# Patient Record
Sex: Female | Born: 1945 | Race: White | Hispanic: No | Smoking: Former smoker
Health system: Southern US, Community
[De-identification: ages and names within clinical notes are randomized; demographics above are authoritative.]

## PROBLEM LIST (undated history)

## (undated) DIAGNOSIS — J449 Chronic obstructive pulmonary disease, unspecified: Secondary | ICD-10-CM

## (undated) DIAGNOSIS — IMO0001 Reserved for inherently not codable concepts without codable children: Secondary | ICD-10-CM

## (undated) DIAGNOSIS — R0602 Shortness of breath: Secondary | ICD-10-CM

## (undated) DIAGNOSIS — R51 Headache: Secondary | ICD-10-CM

## (undated) DIAGNOSIS — K625 Hemorrhage of anus and rectum: Secondary | ICD-10-CM

## (undated) DIAGNOSIS — K219 Gastro-esophageal reflux disease without esophagitis: Secondary | ICD-10-CM

## (undated) DIAGNOSIS — Z5189 Encounter for other specified aftercare: Secondary | ICD-10-CM

## (undated) DIAGNOSIS — I10 Essential (primary) hypertension: Secondary | ICD-10-CM

## (undated) DIAGNOSIS — G43909 Migraine, unspecified, not intractable, without status migrainosus: Secondary | ICD-10-CM

## (undated) DIAGNOSIS — M1990 Unspecified osteoarthritis, unspecified site: Secondary | ICD-10-CM

## (undated) DIAGNOSIS — R634 Abnormal weight loss: Secondary | ICD-10-CM

## (undated) DIAGNOSIS — E785 Hyperlipidemia, unspecified: Secondary | ICD-10-CM

## (undated) DIAGNOSIS — Z8719 Personal history of other diseases of the digestive system: Secondary | ICD-10-CM

## (undated) DIAGNOSIS — R350 Frequency of micturition: Secondary | ICD-10-CM

## (undated) DIAGNOSIS — R062 Wheezing: Secondary | ICD-10-CM

## (undated) DIAGNOSIS — R7303 Prediabetes: Secondary | ICD-10-CM

## (undated) DIAGNOSIS — R0981 Nasal congestion: Secondary | ICD-10-CM

## (undated) HISTORY — DX: Encounter for other specified aftercare: Z51.89

## (undated) HISTORY — DX: Abnormal weight loss: R63.4

## (undated) HISTORY — DX: Nasal congestion: R09.81

## (undated) HISTORY — PX: LEFT OOPHORECTOMY: SHX1961

## (undated) HISTORY — DX: Chronic obstructive pulmonary disease, unspecified: J44.9

## (undated) HISTORY — DX: Wheezing: R06.2

## (undated) HISTORY — DX: Reserved for inherently not codable concepts without codable children: IMO0001

## (undated) HISTORY — PX: APPENDECTOMY: SHX54

## (undated) HISTORY — PX: TUMOR EXCISION: SHX421

## (undated) HISTORY — PX: TUBAL LIGATION: SHX77

## (undated) HISTORY — PX: KNEE ARTHROSCOPY: SHX127

## (undated) HISTORY — DX: Unspecified osteoarthritis, unspecified site: M19.90

## (undated) HISTORY — DX: Hemorrhage of anus and rectum: K62.5

## (undated) HISTORY — PX: COLONOSCOPY: SHX174

---
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
## 1969-08-19 HISTORY — PX: APPENDECTOMY: SHX54

## 1989-08-19 DIAGNOSIS — Z8719 Personal history of other diseases of the digestive system: Secondary | ICD-10-CM

## 1989-08-19 DIAGNOSIS — Z8711 Personal history of peptic ulcer disease: Secondary | ICD-10-CM

## 1989-08-19 HISTORY — DX: Personal history of peptic ulcer disease: Z87.11

## 1989-08-19 HISTORY — DX: Personal history of other diseases of the digestive system: Z87.19

## 1998-12-27 ENCOUNTER — Encounter: Payer: Self-pay | Admitting: Emergency Medicine

## 1998-12-27 ENCOUNTER — Emergency Department (HOSPITAL_COMMUNITY): Admission: EM | Admit: 1998-12-27 | Discharge: 1998-12-27 | Payer: Self-pay | Admitting: Emergency Medicine

## 1999-09-27 ENCOUNTER — Emergency Department (HOSPITAL_COMMUNITY): Admission: EM | Admit: 1999-09-27 | Discharge: 1999-09-27 | Payer: Self-pay | Admitting: Emergency Medicine

## 1999-09-27 ENCOUNTER — Encounter: Payer: Self-pay | Admitting: Emergency Medicine

## 1999-10-14 ENCOUNTER — Emergency Department (HOSPITAL_COMMUNITY): Admission: EM | Admit: 1999-10-14 | Discharge: 1999-10-14 | Payer: Self-pay | Admitting: Emergency Medicine

## 1999-12-03 ENCOUNTER — Emergency Department (HOSPITAL_COMMUNITY): Admission: EM | Admit: 1999-12-03 | Discharge: 1999-12-04 | Payer: Self-pay | Admitting: Emergency Medicine

## 1999-12-18 ENCOUNTER — Emergency Department (HOSPITAL_COMMUNITY): Admission: EM | Admit: 1999-12-18 | Discharge: 1999-12-19 | Payer: Self-pay | Admitting: Emergency Medicine

## 2000-03-21 ENCOUNTER — Emergency Department (HOSPITAL_COMMUNITY): Admission: EM | Admit: 2000-03-21 | Discharge: 2000-03-21 | Payer: Self-pay | Admitting: Emergency Medicine

## 2000-03-21 ENCOUNTER — Encounter: Payer: Self-pay | Admitting: Emergency Medicine

## 2000-04-09 ENCOUNTER — Emergency Department (HOSPITAL_COMMUNITY): Admission: EM | Admit: 2000-04-09 | Discharge: 2000-04-09 | Payer: Self-pay | Admitting: Emergency Medicine

## 2000-08-14 ENCOUNTER — Emergency Department (HOSPITAL_COMMUNITY): Admission: EM | Admit: 2000-08-14 | Discharge: 2000-08-14 | Payer: Self-pay | Admitting: Emergency Medicine

## 2001-01-07 ENCOUNTER — Emergency Department (HOSPITAL_COMMUNITY): Admission: EM | Admit: 2001-01-07 | Discharge: 2001-01-07 | Payer: Self-pay | Admitting: *Deleted

## 2001-03-19 ENCOUNTER — Emergency Department (HOSPITAL_COMMUNITY): Admission: EM | Admit: 2001-03-19 | Discharge: 2001-03-19 | Payer: Self-pay

## 2001-09-04 ENCOUNTER — Emergency Department (HOSPITAL_COMMUNITY): Admission: EM | Admit: 2001-09-04 | Discharge: 2001-09-04 | Payer: Self-pay | Admitting: Podiatry

## 2002-11-25 ENCOUNTER — Emergency Department (HOSPITAL_COMMUNITY): Admission: EM | Admit: 2002-11-25 | Discharge: 2002-11-25 | Payer: Self-pay | Admitting: Emergency Medicine

## 2002-11-26 ENCOUNTER — Encounter: Payer: Self-pay | Admitting: Emergency Medicine

## 2003-02-15 ENCOUNTER — Emergency Department (HOSPITAL_COMMUNITY): Admission: EM | Admit: 2003-02-15 | Discharge: 2003-02-16 | Payer: Self-pay | Admitting: Emergency Medicine

## 2003-06-13 ENCOUNTER — Emergency Department (HOSPITAL_COMMUNITY): Admission: EM | Admit: 2003-06-13 | Discharge: 2003-06-14 | Payer: Self-pay | Admitting: Emergency Medicine

## 2003-06-14 ENCOUNTER — Encounter: Payer: Self-pay | Admitting: Emergency Medicine

## 2003-10-01 ENCOUNTER — Emergency Department (HOSPITAL_COMMUNITY): Admission: EM | Admit: 2003-10-01 | Discharge: 2003-10-02 | Payer: Self-pay | Admitting: Emergency Medicine

## 2003-10-01 ENCOUNTER — Encounter: Payer: Self-pay | Admitting: Emergency Medicine

## 2003-11-05 ENCOUNTER — Encounter: Admission: RE | Admit: 2003-11-05 | Discharge: 2003-11-05 | Payer: Self-pay | Admitting: Internal Medicine

## 2004-03-05 ENCOUNTER — Emergency Department (HOSPITAL_COMMUNITY): Admission: EM | Admit: 2004-03-05 | Discharge: 2004-03-05 | Payer: Self-pay | Admitting: Emergency Medicine

## 2004-07-07 ENCOUNTER — Encounter: Admission: RE | Admit: 2004-07-07 | Discharge: 2004-07-07 | Payer: Self-pay | Admitting: Internal Medicine

## 2004-07-23 ENCOUNTER — Emergency Department (HOSPITAL_COMMUNITY): Admission: EM | Admit: 2004-07-23 | Discharge: 2004-07-23 | Payer: Self-pay | Admitting: Emergency Medicine

## 2004-09-08 ENCOUNTER — Emergency Department (HOSPITAL_COMMUNITY): Admission: EM | Admit: 2004-09-08 | Discharge: 2004-09-08 | Payer: Self-pay | Admitting: Emergency Medicine

## 2004-11-29 ENCOUNTER — Emergency Department (HOSPITAL_COMMUNITY): Admission: EM | Admit: 2004-11-29 | Discharge: 2004-11-29 | Payer: Self-pay | Admitting: Emergency Medicine

## 2004-12-15 ENCOUNTER — Ambulatory Visit (HOSPITAL_COMMUNITY): Admission: RE | Admit: 2004-12-15 | Discharge: 2004-12-15 | Payer: Self-pay | Admitting: Internal Medicine

## 2005-03-02 ENCOUNTER — Ambulatory Visit: Payer: Self-pay | Admitting: Family Medicine

## 2005-03-03 ENCOUNTER — Ambulatory Visit: Payer: Self-pay | Admitting: *Deleted

## 2005-04-13 ENCOUNTER — Ambulatory Visit: Payer: Self-pay | Admitting: Family Medicine

## 2005-05-04 ENCOUNTER — Ambulatory Visit: Payer: Self-pay | Admitting: Family Medicine

## 2005-05-11 ENCOUNTER — Ambulatory Visit: Payer: Self-pay | Admitting: Family Medicine

## 2005-05-27 ENCOUNTER — Ambulatory Visit: Payer: Self-pay | Admitting: Family Medicine

## 2006-02-24 ENCOUNTER — Emergency Department (HOSPITAL_COMMUNITY): Admission: EM | Admit: 2006-02-24 | Discharge: 2006-02-25 | Payer: Self-pay | Admitting: Emergency Medicine

## 2006-03-22 ENCOUNTER — Ambulatory Visit: Payer: Self-pay | Admitting: Family Medicine

## 2006-03-23 ENCOUNTER — Encounter (INDEPENDENT_AMBULATORY_CARE_PROVIDER_SITE_OTHER): Payer: Self-pay | Admitting: Family Medicine

## 2006-03-23 LAB — CONVERTED CEMR LAB
Microalbumin U total vol: 6.34 mg/L
TSH: 1.419 microintl units/mL

## 2006-03-24 ENCOUNTER — Encounter (INDEPENDENT_AMBULATORY_CARE_PROVIDER_SITE_OTHER): Payer: Self-pay | Admitting: Family Medicine

## 2006-03-24 LAB — CONVERTED CEMR LAB: Hgb A1c MFr Bld: 6.1 %

## 2006-03-30 ENCOUNTER — Ambulatory Visit (HOSPITAL_COMMUNITY): Admission: RE | Admit: 2006-03-30 | Discharge: 2006-03-30 | Payer: Self-pay | Admitting: Obstetrics and Gynecology

## 2006-04-30 LAB — CONVERTED CEMR LAB: Pap Smear: NORMAL

## 2006-05-04 ENCOUNTER — Ambulatory Visit: Payer: Self-pay | Admitting: Family Medicine

## 2006-06-19 ENCOUNTER — Ambulatory Visit: Payer: Self-pay | Admitting: Family Medicine

## 2006-08-09 ENCOUNTER — Ambulatory Visit: Payer: Self-pay | Admitting: Family Medicine

## 2006-08-09 LAB — CONVERTED CEMR LAB: Pap Smear: NORMAL

## 2006-10-04 ENCOUNTER — Ambulatory Visit: Payer: Self-pay | Admitting: Family Medicine

## 2006-12-15 ENCOUNTER — Ambulatory Visit (HOSPITAL_COMMUNITY): Admission: RE | Admit: 2006-12-15 | Discharge: 2006-12-15 | Payer: Self-pay | Admitting: Gastroenterology

## 2007-02-16 ENCOUNTER — Ambulatory Visit: Payer: Self-pay | Admitting: Internal Medicine

## 2007-02-21 ENCOUNTER — Ambulatory Visit: Payer: Self-pay | Admitting: Family Medicine

## 2007-03-05 ENCOUNTER — Emergency Department (HOSPITAL_COMMUNITY): Admission: EM | Admit: 2007-03-05 | Discharge: 2007-03-06 | Payer: Self-pay | Admitting: Emergency Medicine

## 2007-03-09 ENCOUNTER — Ambulatory Visit: Payer: Self-pay | Admitting: Internal Medicine

## 2007-03-16 ENCOUNTER — Encounter: Admission: RE | Admit: 2007-03-16 | Discharge: 2007-04-25 | Payer: Self-pay | Admitting: Internal Medicine

## 2007-04-09 ENCOUNTER — Ambulatory Visit (HOSPITAL_COMMUNITY): Admission: RE | Admit: 2007-04-09 | Discharge: 2007-04-09 | Payer: Self-pay | Admitting: Internal Medicine

## 2007-04-24 ENCOUNTER — Ambulatory Visit: Payer: Self-pay | Admitting: Internal Medicine

## 2007-05-03 ENCOUNTER — Ambulatory Visit: Payer: Self-pay | Admitting: Internal Medicine

## 2007-07-09 ENCOUNTER — Emergency Department (HOSPITAL_COMMUNITY): Admission: EM | Admit: 2007-07-09 | Discharge: 2007-07-09 | Payer: Self-pay | Admitting: Emergency Medicine

## 2007-08-01 ENCOUNTER — Encounter (INDEPENDENT_AMBULATORY_CARE_PROVIDER_SITE_OTHER): Payer: Self-pay | Admitting: Family Medicine

## 2007-08-02 ENCOUNTER — Ambulatory Visit: Payer: Self-pay | Admitting: Family Medicine

## 2007-08-02 ENCOUNTER — Encounter (INDEPENDENT_AMBULATORY_CARE_PROVIDER_SITE_OTHER): Payer: Self-pay | Admitting: Internal Medicine

## 2007-08-02 LAB — CONVERTED CEMR LAB
ALT: 13 units/L (ref 0–35)
AST: 14 units/L (ref 0–37)
Albumin: 4.5 g/dL (ref 3.5–5.2)
Alkaline Phosphatase: 58 units/L (ref 39–117)
BUN: 13 mg/dL (ref 6–23)
Basophils Absolute: 0 10*3/uL (ref 0.0–0.1)
Basophils Relative: 1 % (ref 0–1)
CO2: 24 meq/L (ref 19–32)
Calcium: 9.1 mg/dL (ref 8.4–10.5)
Chloride: 105 meq/L (ref 96–112)
Creatinine, Ser: 0.59 mg/dL (ref 0.40–1.20)
Eosinophils Absolute: 0.2 10*3/uL (ref 0.0–0.7)
Eosinophils Relative: 4 % (ref 0–5)
Glucose, Bld: 91 mg/dL (ref 70–99)
HCT: 43.4 % (ref 36.0–46.0)
Hemoglobin: 14.3 g/dL (ref 12.0–15.0)
Lymphocytes Relative: 31 % (ref 12–46)
Lymphs Abs: 2 10*3/uL (ref 0.7–3.3)
MCHC: 32.9 g/dL (ref 30.0–36.0)
MCV: 90.2 fL (ref 78.0–100.0)
Monocytes Absolute: 0.4 10*3/uL (ref 0.2–0.7)
Monocytes Relative: 7 % (ref 3–11)
Neutro Abs: 3.7 10*3/uL (ref 1.7–7.7)
Neutrophils Relative %: 58 % (ref 43–77)
Platelets: 259 10*3/uL (ref 150–400)
Potassium: 4.5 meq/L (ref 3.5–5.3)
RBC: 4.81 M/uL (ref 3.87–5.11)
RDW: 14 % (ref 11.5–14.0)
Sodium: 140 meq/L (ref 135–145)
Total Bilirubin: 0.4 mg/dL (ref 0.3–1.2)
Total Protein: 6.7 g/dL (ref 6.0–8.3)
WBC: 6.3 10*3/uL (ref 4.0–10.5)

## 2007-08-09 ENCOUNTER — Encounter (INDEPENDENT_AMBULATORY_CARE_PROVIDER_SITE_OTHER): Payer: Self-pay | Admitting: Family Medicine

## 2007-08-09 DIAGNOSIS — G43009 Migraine without aura, not intractable, without status migrainosus: Secondary | ICD-10-CM

## 2007-08-09 DIAGNOSIS — Z8679 Personal history of other diseases of the circulatory system: Secondary | ICD-10-CM | POA: Insufficient documentation

## 2007-08-09 DIAGNOSIS — I10 Essential (primary) hypertension: Secondary | ICD-10-CM | POA: Insufficient documentation

## 2007-08-09 HISTORY — DX: Personal history of other diseases of the circulatory system: Z86.79

## 2007-08-09 HISTORY — DX: Essential (primary) hypertension: I10

## 2007-08-09 HISTORY — DX: Migraine without aura, not intractable, without status migrainosus: G43.009

## 2007-08-25 DIAGNOSIS — E785 Hyperlipidemia, unspecified: Secondary | ICD-10-CM | POA: Insufficient documentation

## 2007-08-25 HISTORY — DX: Hyperlipidemia, unspecified: E78.5

## 2007-09-05 ENCOUNTER — Encounter (INDEPENDENT_AMBULATORY_CARE_PROVIDER_SITE_OTHER): Payer: Self-pay | Admitting: *Deleted

## 2007-09-24 ENCOUNTER — Telehealth (INDEPENDENT_AMBULATORY_CARE_PROVIDER_SITE_OTHER): Payer: Self-pay | Admitting: *Deleted

## 2007-09-28 ENCOUNTER — Ambulatory Visit: Payer: Self-pay | Admitting: Internal Medicine

## 2007-09-28 LAB — CONVERTED CEMR LAB
Bilirubin Urine: NEGATIVE
Glucose, Urine, Semiquant: NEGATIVE
Ketones, urine, test strip: NEGATIVE
Nitrite: NEGATIVE
Protein, U semiquant: NEGATIVE
Specific Gravity, Urine: >=1.03
Urobilinogen, UA: NEGATIVE
WBC Urine, dipstick: NEGATIVE
pH: 5.5

## 2007-09-29 ENCOUNTER — Encounter (INDEPENDENT_AMBULATORY_CARE_PROVIDER_SITE_OTHER): Payer: Self-pay | Admitting: Internal Medicine

## 2007-10-04 ENCOUNTER — Telehealth (INDEPENDENT_AMBULATORY_CARE_PROVIDER_SITE_OTHER): Payer: Self-pay | Admitting: Internal Medicine

## 2007-10-10 ENCOUNTER — Encounter (INDEPENDENT_AMBULATORY_CARE_PROVIDER_SITE_OTHER): Payer: Self-pay | Admitting: Family Medicine

## 2007-12-19 ENCOUNTER — Telehealth (INDEPENDENT_AMBULATORY_CARE_PROVIDER_SITE_OTHER): Payer: Self-pay | Admitting: Nurse Practitioner

## 2007-12-21 ENCOUNTER — Ambulatory Visit: Payer: Self-pay | Admitting: Family Medicine

## 2007-12-21 ENCOUNTER — Telehealth (INDEPENDENT_AMBULATORY_CARE_PROVIDER_SITE_OTHER): Payer: Self-pay | Admitting: Family Medicine

## 2008-02-06 ENCOUNTER — Telehealth (INDEPENDENT_AMBULATORY_CARE_PROVIDER_SITE_OTHER): Payer: Self-pay | Admitting: Nurse Practitioner

## 2008-02-08 ENCOUNTER — Ambulatory Visit: Payer: Self-pay | Admitting: Nurse Practitioner

## 2008-02-26 ENCOUNTER — Ambulatory Visit: Payer: Self-pay | Admitting: Family Medicine

## 2008-02-26 ENCOUNTER — Ambulatory Visit (HOSPITAL_COMMUNITY): Admission: RE | Admit: 2008-02-26 | Discharge: 2008-02-26 | Payer: Self-pay | Admitting: Family Medicine

## 2008-02-27 ENCOUNTER — Ambulatory Visit (HOSPITAL_COMMUNITY): Admission: RE | Admit: 2008-02-27 | Discharge: 2008-02-27 | Payer: Self-pay | Admitting: Family Medicine

## 2008-03-21 ENCOUNTER — Telehealth (INDEPENDENT_AMBULATORY_CARE_PROVIDER_SITE_OTHER): Payer: Self-pay | Admitting: Internal Medicine

## 2008-03-24 LAB — CONVERTED CEMR LAB
ALT: 12 units/L (ref 0–35)
AST: 15 units/L (ref 0–37)
Albumin: 4.5 g/dL (ref 3.5–5.2)
Alkaline Phosphatase: 64 units/L (ref 39–117)
BUN: 14 mg/dL (ref 6–23)
CO2: 25 meq/L (ref 19–32)
Calcium: 9.2 mg/dL (ref 8.4–10.5)
Chloride: 106 meq/L (ref 96–112)
Cholesterol: 265 mg/dL — ABNORMAL HIGH (ref 0–200)
Creatinine, Ser: 0.65 mg/dL (ref 0.40–1.20)
Glucose, Bld: 81 mg/dL (ref 70–99)
HDL: 48 mg/dL (ref >39)
LDL Cholesterol: 188 mg/dL — ABNORMAL HIGH (ref 0–99)
Potassium: 4.8 meq/L (ref 3.5–5.3)
Sodium: 143 meq/L (ref 135–145)
Total Bilirubin: 0.4 mg/dL (ref 0.3–1.2)
Total CHOL/HDL Ratio: 5.5
Total Protein: 6.7 g/dL (ref 6.0–8.3)
Triglycerides: 143 mg/dL (ref <150)
VLDL: 29 mg/dL (ref 0–40)

## 2008-04-21 ENCOUNTER — Ambulatory Visit: Payer: Self-pay | Admitting: Family Medicine

## 2008-04-21 DIAGNOSIS — J069 Acute upper respiratory infection, unspecified: Secondary | ICD-10-CM | POA: Insufficient documentation

## 2008-04-30 ENCOUNTER — Encounter (INDEPENDENT_AMBULATORY_CARE_PROVIDER_SITE_OTHER): Payer: Self-pay | Admitting: Family Medicine

## 2008-04-30 ENCOUNTER — Ambulatory Visit: Payer: Self-pay | Admitting: Family Medicine

## 2008-04-30 LAB — CONVERTED CEMR LAB: Pap Smear: NORMAL

## 2008-05-01 ENCOUNTER — Ambulatory Visit (HOSPITAL_COMMUNITY): Admission: RE | Admit: 2008-05-01 | Discharge: 2008-05-01 | Payer: Self-pay | Admitting: Family Medicine

## 2008-05-13 ENCOUNTER — Encounter (INDEPENDENT_AMBULATORY_CARE_PROVIDER_SITE_OTHER): Payer: Self-pay | Admitting: *Deleted

## 2008-05-13 LAB — CONVERTED CEMR LAB
ALT: 16 units/L (ref 0–35)
AST: 17 units/L (ref 0–37)
Albumin: 4.4 g/dL (ref 3.5–5.2)
Alkaline Phosphatase: 61 units/L (ref 39–117)
BUN: 13 mg/dL (ref 6–23)
CO2: 20 meq/L (ref 19–32)
Calcium: 8.7 mg/dL (ref 8.4–10.5)
Chlamydia, DNA Probe: NEGATIVE
Chloride: 105 meq/L (ref 96–112)
Cholesterol: 277 mg/dL — ABNORMAL HIGH (ref 0–200)
Creatinine, Ser: 0.57 mg/dL (ref 0.40–1.20)
GC Probe Amp, Genital: NEGATIVE
Glucose, Bld: 78 mg/dL (ref 70–99)
HDL: 55 mg/dL (ref >39)
LDL Cholesterol: 191 mg/dL — ABNORMAL HIGH (ref 0–99)
Potassium: 4.2 meq/L (ref 3.5–5.3)
Sodium: 142 meq/L (ref 135–145)
Total Bilirubin: 0.4 mg/dL (ref 0.3–1.2)
Total CHOL/HDL Ratio: 5
Total Protein: 7.3 g/dL (ref 6.0–8.3)
Triglycerides: 154 mg/dL — ABNORMAL HIGH (ref <150)
VLDL: 31 mg/dL (ref 0–40)

## 2008-07-14 ENCOUNTER — Encounter (INDEPENDENT_AMBULATORY_CARE_PROVIDER_SITE_OTHER): Payer: Self-pay | Admitting: *Deleted

## 2008-09-30 ENCOUNTER — Ambulatory Visit: Payer: Self-pay | Admitting: Family Medicine

## 2008-09-30 DIAGNOSIS — B029 Zoster without complications: Secondary | ICD-10-CM

## 2008-09-30 HISTORY — DX: Zoster without complications: B02.9

## 2008-09-30 LAB — CONVERTED CEMR LAB
Bilirubin Urine: NEGATIVE
Glucose, Urine, Semiquant: NEGATIVE
Ketones, urine, test strip: NEGATIVE
Nitrite: NEGATIVE
Protein, U semiquant: NEGATIVE
Specific Gravity, Urine: <1.005
Urobilinogen, UA: 0.2
WBC Urine, dipstick: NEGATIVE
pH: 6

## 2008-10-01 ENCOUNTER — Encounter (INDEPENDENT_AMBULATORY_CARE_PROVIDER_SITE_OTHER): Payer: Self-pay | Admitting: Family Medicine

## 2008-12-29 ENCOUNTER — Emergency Department (HOSPITAL_COMMUNITY): Admission: EM | Admit: 2008-12-29 | Discharge: 2008-12-29 | Payer: Self-pay | Admitting: Emergency Medicine

## 2009-01-02 ENCOUNTER — Emergency Department (HOSPITAL_COMMUNITY): Admission: EM | Admit: 2009-01-02 | Discharge: 2009-01-03 | Payer: Self-pay | Admitting: Emergency Medicine

## 2009-08-20 ENCOUNTER — Telehealth: Payer: Self-pay | Admitting: Physician Assistant

## 2009-08-25 ENCOUNTER — Ambulatory Visit: Payer: Self-pay | Admitting: Physician Assistant

## 2009-08-25 DIAGNOSIS — J449 Chronic obstructive pulmonary disease, unspecified: Secondary | ICD-10-CM | POA: Insufficient documentation

## 2009-08-25 DIAGNOSIS — J4489 Other specified chronic obstructive pulmonary disease: Secondary | ICD-10-CM | POA: Insufficient documentation

## 2009-08-25 LAB — CONVERTED CEMR LAB
ALT: 10 units/L (ref 0–35)
AST: 14 units/L (ref 0–37)
Albumin: 4.3 g/dL (ref 3.5–5.2)
Alkaline Phosphatase: 52 units/L (ref 39–117)
BUN: 19 mg/dL (ref 6–23)
Bacteria, UA: 0
Basophils Absolute: 0.1 10*3/uL (ref 0.0–0.1)
Basophils Relative: 1 % (ref 0–1)
Bilirubin Urine: NEGATIVE
CO2: 23 meq/L (ref 19–32)
Calcium: 9.6 mg/dL (ref 8.4–10.5)
Casts: 0 /lpf
Chloride: 104 meq/L (ref 96–112)
Creatinine, Ser: 0.6 mg/dL (ref 0.40–1.20)
Eosinophils Absolute: 0.3 10*3/uL (ref 0.0–0.7)
Eosinophils Relative: 3 % (ref 0–5)
Glucose, Bld: 100 mg/dL — ABNORMAL HIGH (ref 70–99)
Glucose, Urine, Semiquant: NEGATIVE
HCT: 43.9 % (ref 36.0–46.0)
Hemoglobin: 14 g/dL (ref 12.0–15.0)
Ketones, urine, test strip: NEGATIVE
Lymphocytes Relative: 27 % (ref 12–46)
Lymphs Abs: 2.3 10*3/uL (ref 0.7–4.0)
MCHC: 31.9 g/dL (ref 30.0–36.0)
MCV: 91.3 fL (ref 78.0–100.0)
Monocytes Absolute: 0.6 10*3/uL (ref 0.1–1.0)
Monocytes Relative: 7 % (ref 3–12)
Mucus, UA: 0
Neutro Abs: 5.3 10*3/uL (ref 1.7–7.7)
Neutrophils Relative %: 62 % (ref 43–77)
Nitrite: NEGATIVE
Platelets: 250 10*3/uL (ref 150–400)
Potassium: 4.2 meq/L (ref 3.5–5.3)
Protein, U semiquant: 30
RBC / HPF: 0
RBC: 4.81 M/uL (ref 3.87–5.11)
RDW: 14.9 % (ref 11.5–15.5)
Sodium: 140 meq/L (ref 135–145)
Specific Gravity, Urine: 1.015
TSH: 1.247 microintl units/mL (ref 0.350–4.500)
Total Bilirubin: 0.3 mg/dL (ref 0.3–1.2)
Total Protein: 6.7 g/dL (ref 6.0–8.3)
Urine crystals, microscopic: 0 /hpf
Urobilinogen, UA: 0.2
WBC Urine, dipstick: NEGATIVE
WBC, UA: 0 cells/hpf
WBC: 8.5 10*3/uL (ref 4.0–10.5)
pH: 6

## 2009-08-26 ENCOUNTER — Encounter: Payer: Self-pay | Admitting: Physician Assistant

## 2009-08-28 ENCOUNTER — Ambulatory Visit (HOSPITAL_COMMUNITY): Admission: RE | Admit: 2009-08-28 | Discharge: 2009-08-28 | Payer: Self-pay | Admitting: Internal Medicine

## 2009-08-30 ENCOUNTER — Emergency Department (HOSPITAL_COMMUNITY): Admission: EM | Admit: 2009-08-30 | Discharge: 2009-08-30 | Payer: Self-pay | Admitting: Emergency Medicine

## 2009-09-03 ENCOUNTER — Ambulatory Visit (HOSPITAL_COMMUNITY): Admission: RE | Admit: 2009-09-03 | Discharge: 2009-09-03 | Payer: Self-pay | Admitting: Physician Assistant

## 2009-09-08 ENCOUNTER — Encounter: Payer: Self-pay | Admitting: Physician Assistant

## 2009-09-10 ENCOUNTER — Telehealth (INDEPENDENT_AMBULATORY_CARE_PROVIDER_SITE_OTHER): Payer: Self-pay | Admitting: *Deleted

## 2009-09-10 DIAGNOSIS — M1710 Unilateral primary osteoarthritis, unspecified knee: Secondary | ICD-10-CM | POA: Insufficient documentation

## 2009-09-10 DIAGNOSIS — M179 Osteoarthritis of knee, unspecified: Secondary | ICD-10-CM | POA: Insufficient documentation

## 2010-01-21 ENCOUNTER — Encounter: Payer: Self-pay | Admitting: Internal Medicine

## 2010-01-21 ENCOUNTER — Ambulatory Visit: Payer: Self-pay | Admitting: Internal Medicine

## 2010-04-05 ENCOUNTER — Emergency Department (HOSPITAL_COMMUNITY): Admission: EM | Admit: 2010-04-05 | Discharge: 2010-04-05 | Payer: Self-pay | Admitting: Emergency Medicine

## 2010-06-11 ENCOUNTER — Encounter: Payer: Self-pay | Admitting: Physician Assistant

## 2010-09-03 ENCOUNTER — Emergency Department (HOSPITAL_COMMUNITY): Admission: EM | Admit: 2010-09-03 | Discharge: 2010-09-03 | Payer: Self-pay | Admitting: Emergency Medicine

## 2010-09-09 ENCOUNTER — Ambulatory Visit: Payer: Self-pay | Admitting: Physician Assistant

## 2010-09-09 DIAGNOSIS — J441 Chronic obstructive pulmonary disease with (acute) exacerbation: Secondary | ICD-10-CM | POA: Insufficient documentation

## 2010-09-09 DIAGNOSIS — L259 Unspecified contact dermatitis, unspecified cause: Secondary | ICD-10-CM | POA: Insufficient documentation

## 2010-09-09 LAB — CONVERTED CEMR LAB
ALT: 21 units/L (ref 0–35)
AST: 19 units/L (ref 0–37)
Albumin: 4.3 g/dL (ref 3.5–5.2)
Alkaline Phosphatase: 62 units/L (ref 39–117)
BUN: 22 mg/dL (ref 6–23)
CO2: 28 meq/L (ref 19–32)
Calcium: 9.5 mg/dL (ref 8.4–10.5)
Chloride: 102 meq/L (ref 96–112)
Creatinine, Ser: 0.64 mg/dL (ref 0.40–1.20)
Glucose, Bld: 87 mg/dL (ref 70–99)
Potassium: 4.5 meq/L (ref 3.5–5.3)
Sodium: 138 meq/L (ref 135–145)
Total Bilirubin: 0.3 mg/dL (ref 0.3–1.2)
Total Protein: 6.7 g/dL (ref 6.0–8.3)

## 2010-09-10 ENCOUNTER — Encounter: Payer: Self-pay | Admitting: Physician Assistant

## 2010-09-25 ENCOUNTER — Emergency Department (HOSPITAL_COMMUNITY): Admission: EM | Admit: 2010-09-25 | Discharge: 2010-09-25 | Payer: Self-pay | Admitting: Emergency Medicine

## 2010-10-23 ENCOUNTER — Emergency Department (HOSPITAL_COMMUNITY): Admission: EM | Admit: 2010-10-23 | Discharge: 2010-10-24 | Payer: Self-pay | Admitting: Emergency Medicine

## 2010-11-10 ENCOUNTER — Ambulatory Visit: Payer: Self-pay | Admitting: Nurse Practitioner

## 2010-11-10 DIAGNOSIS — J329 Chronic sinusitis, unspecified: Secondary | ICD-10-CM | POA: Insufficient documentation

## 2010-11-17 ENCOUNTER — Ambulatory Visit: Payer: Self-pay | Admitting: Nurse Practitioner

## 2010-11-19 ENCOUNTER — Telehealth (INDEPENDENT_AMBULATORY_CARE_PROVIDER_SITE_OTHER): Payer: Self-pay | Admitting: Nurse Practitioner

## 2010-11-19 ENCOUNTER — Ambulatory Visit: Payer: Self-pay | Admitting: Internal Medicine

## 2010-11-19 DIAGNOSIS — R21 Rash and other nonspecific skin eruption: Secondary | ICD-10-CM | POA: Insufficient documentation

## 2010-11-23 ENCOUNTER — Ambulatory Visit: Payer: Self-pay | Admitting: Nurse Practitioner
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
## 2011-01-20 NOTE — Progress Notes (Signed)
Summary: topamax refill  Phone Note Call from Patient   Summary of Call: pt returning call from letter received, test results given and pt asked [facility] Initial call taken by: Vesta Mixer CMA,  March 21, 2008 4:11 PM  Follow-up for Phone Call        meds sent to pharamcy. notify pt Follow-up by: Lehman Prom FNP,  March 21, 2008 7:00 PM  Additional Follow-up for Phone Call Additional follow up Details #1::        bad connection when trying to contact pt Additional Follow-up by: Vesta Mixer CMA,  March 25, 2008 2:51 PM    New/Updated Medications: TOPAMAX 50 MG TABS (TOPIRAMATE) 1 by mouth twice daily   Prescriptions: TOPAMAX 50 MG TABS (TOPIRAMATE) 1 by mouth twice daily  #60 x 5   Entered and Authorized by:   Lehman Prom FNP   Signed by:   Lehman Prom FNP on 03/21/2008   Method used:   Electron[facility] ...       [facility] [address] [phone]*       [address]       [place], [place]  [address]       Ph: [phone] or [phone]       Fax: [phone]   RxID:   [id number]     Appended Document: topamax refill PT STATED THAT SHE COULD NOT GET [facility] IT WAS 240.00. SHE WOULD LIKE RX CALLED INTO [facility] AND SHE ALSO WOULD LIKE THE RX FOR ZOCOR CALLED INTO PHARMACY AS WELL. PT STATES HAS NOT STARTED TAKING ANY ZOCOR BECAUSE SHE STATED SHE NEVER GOT A RX.  Appended Document: topamax refill meds printed to be faxed to [facility]   Clinical Lists Changes  Medications: Rx of TOPAMAX 50 MG TABS (TOPIRAMATE) 1 by mouth twice daily;  #30 x 5;  Signed;  Entered by: Lehman Prom FNP;  Authorized by: Lehman Prom FNP;  Method used: Printed then [facility]ed [facility] [address] [phone]*, [address] [place], [place], [place]  [address], Ph: [phone] or [phone], Fax: [phone] Rx of ZOCOR 40 MG  TABS (SIMVASTATIN) Take 1 tab by mouth at  bedtime see nurse in 8 weeks for fasting labs;  #30 x 2;  Signed;  Entered by: Lehman Prom FNP;  Authorized by: Lehman Prom FNP;  Method used: Printed then faxed to [facility] [address] [phone]*, [address] [place], [place], [place]  [address], Ph: [phone] or [phone], Fax: [phone]    Prescriptions: ZOCOR 40 MG  TABS (SIMVASTATIN) Take 1 tab by mouth at bedtime see nurse in 8 weeks for fasting labs  #30 x 2   Entered and Authorized by:   Lehman Prom FNP   Signed by:   Lehman Prom FNP on 04/09/2008   Method used:   Printed then faxed to ...       [facility] [address] [phone]*       [address]       [place], [place]  [address]       Ph: [phone] or [phone]       Fax: [phone]   RxID:   [id number] TOPAMAX 50 MG TABS (TOPIRAMATE) 1 by mouth twice daily  #30 x 5   Entered and Authorized by:   Lehman Prom  FNP   Signed by:   Lehman Prom FNP on 04/09/2008   Method used:   Printed then faxed to ...       [facility] [address] [phone]*       [address]       [place], [place]  [address]       Ph: [phone] or [phone]       Fax: [phone]   RxID:   [id number]

## 2011-01-20 NOTE — Assessment & Plan Note (Signed)
Summary: c/o Kidney pain///rjp   Vital Signs:  Patient Profile:   65 Years Old Female Height:     62 inches Weight:      169 pounds BMI:     31.02 Temp:     97.5 degrees F Pulse rate:   88 / minute Pulse rhythm:   regular Resp:     20 per minute BP sitting:   112 / 76  (left arm) Cuff size:   regular  Pt. in pain?   yes    Location:   left side    Intensity:   9  Vitals Entered By: Vesta Mixer CMA (September 28, 2007 4:00 PM)                  Chief Complaint:  Left side pain x 5 days.  History of Present Illness: Pain in left thoracic back for 5 days.  Aching pain with occasional sharp shooting pains.  Increased urinary frequency.  No fever. Feels sore to twist thorax.  Has not increased fluid intake.   No hematuria.  May have odor to urine occasionally.  No dysuria.  No recent hx of injury to area.  BMs soft and easy to pass.  Had a stool about 3 weeks ago with bright red blood on tissue and possibly mixed with stool, not just on outside.  States has had that happen 3 times  within a month.  Feels she has a tag or hemorrhoid that she has felt down in anal area.  These stools have been pain free.   Current Allergies (reviewed today): ! DARVOCET ! AMOXICILLIN ! PENICILLIN      Physical Exam  Lungs:     CTA Heart:     normal rate and regular rhythm.   Abdomen:     Bowel sounds positive,abdomen soft and non-tender without masses, organomegaly or hernias noted. Msk:     Tender on palpation of posteriolateral thoracic back--particularly tender over ribs.  No definite flank tenderness--tender area a bit higher than flank.  No obvious rash or swelling.  No crepitation or drop off.    Impression & Recommendations:  Problem # 1:  BACK PAIN (ICD-724.5) Seems a bit high for pyelonephritis. UA unremarkable and no fever. However, will send urine for culture.  Her updated medication list for this problem includes:    Naproxen 500 Mg Tabs (Naproxen) .Marland Kitchen... 1 tab by  mouth two times a day with food as needed pain  Orders: T-Culture, Urine ([id number])   Complete Medication List: 1)  Toprol Xl 50 Mg Tb24 (Metoprolol succinate) .... Take one tablet every morning 2)  Aciphex 20 Mg Tbec (Rabeprazole sodium) .... Take one tablet each day 3)  Zocor 20 Mg Tabs (Simvastatin) .... By mouth once daily 4)  Ventolin Hfa 108 (90 Base) Mcg/act Aers (Albuterol sulfate) .... Inhale 2 puffs every 4 hours as needed 5)  Allegra 180 Mg Tabs (Fexofenadine hcl) .... Take one tablet each day 6)  Topamax 50 Mg Tabs (Topiramate) .Marland Kitchen.. 1 by mouth bid 7)  Duridrin  8)  Naproxen 500 Mg Tabs (Naproxen) .Marland Kitchen.. 1 tab by mouth two times a day with food as needed pain   Patient Instructions: 1)  Push fluids--not sodas or juices. 2)  Take naproxen with food. 3)  Heating pad to area no more than 20 mins. at a time.  Do not fall asleep with it on.    Prescriptions: VENTOLIN HFA 108 (90 BASE) MCG/ACT  AERS (ALBUTEROL SULFATE) Inhale 2  puffs every 4 hours as needed  #1 x 0   Entered and Authorized by:   Julieanne Manson MD   Signed by:   Julieanne Manson MD on 09/28/2007   Method used:   Print then Give to Patient   RxID:   [id number] NAPROXEN 500 MG  TABS (NAPROXEN) 1 tab by mouth two times a day with food as needed pain  #60 x 0   Entered and Authorized by:   Julieanne Manson MD   Signed by:   Julieanne Manson MD on 09/28/2007   Method used:   Print then Give to Patient   RxID:   [id number]  ] Laboratory Results   Urine Tests  Date/Time Received: September 28, 2007 4:35 PM   Routine Urinalysis   Color: yellow Appearance: Clear Glucose: negative   (Normal Range: Negative) Bilirubin: negative   (Normal Range: Negative) Ketone: negative   (Normal Range: Negative) Spec. Gravity: >=1.030   (Normal Range: 1.003-1.035) Blood: trace-intact   (Normal Range: Negative) pH: 5.5   (Normal Range: 5.0-8.0) Protein: negative   (Normal Range: Negative)  Urobilinogen: negative   (Normal Range: 0-1) Nitrite: negative   (Normal Range: Negative) Leukocyte Esterace: negative   (Normal Range: Negative)

## 2011-01-20 NOTE — Letter (Signed)
Summary: *HSN Results Follow up  [facility]  [address] [place], [place] [address]   Phone: [phone]  Fax: [phone]      08/26/2009   Summer Jackson [address] [place], [place]  [address]   Dear  [name],                            ____S.Drinkard,FNP   ____D. Gore,FNP       ____B. McPherson,MD   ____V. Rankins,MD    ____E. Mulberry,MD    ____N. Daphine Deutscher, FNP  ____D. Reche Dixon, MD    ____K. Philipp Deputy, MD    __x__S. Alben Spittle, PA-C     This letter is to inform you that your recent test(s):  _______Pap Smear    ___x____Lab Test     _______X-ray    ___x____ is within acceptable limits  _______ requires a medication change  _______ requires a follow-up lab visit  _______ requires a follow-up visit with your provider   Comments:       _________________________________________________________ If you have any questions, please contact our office                     Sincerely,  Tereso Newcomer PA-C [facility]   Appended Document: *HSN Results Follow up mailed)}

## 2011-01-20 NOTE — Assessment & Plan Note (Signed)
Summary: Rash; COPD Exac.   Vital Signs:  Patient profile:   65 year old female Height:      62 inches Weight:      179 pounds BMI:     32.86 Temp:     98.2 degrees F oral Pulse rate:   72 / minute Pulse rhythm:   regular Resp:     18 per minute BP sitting:   130 / 82  (left arm) Cuff size:   regular  Vitals Entered By: [place] Shannon (September 09, 2010 2:15 PM) CC: pt is here for rash on left arm and on left side of neck.... pt says she has a cough that she can not get rid of.... meds reviewed Is Patient Diabetic? No Pain Assessment Patient in pain? no       Does patient need assistance? Functional Status Self care Ambulation Normal   Primary Care Provider:  Tereso Newcomer, PA-C  CC:  pt is here for rash on left arm and on left side of neck.... pt says she has a cough that she can not get rid of.... meds reviewed.  History of Present Illness: Here after 1 year hiatus. Did go to [facility] x 1. Now reports rash on arm for 3 days and cough x 1 month. Has not taken anything.  Thinks she started using new detergent.  No other new soaps, clothes.  Does have some rash at scalp line on left posteriorly.  Using same shampoo.  No new meds.  Somewhat pruritic.  No lip swelling or trouble breathing. Cough with sputum that is clear to white.  Increased production.  Increased dyspnea.  Increased wheezing.  Using proventil.  No longer smoking.  Chest hurts when she coughs.  No syncope.  Had some congestion in right ear.  No sore throat.  No vomiting or diarrhea.  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 1 Tablet By Mouth Daily 2)  Ventolin Hfa 108 (90 Base) Mcg/act  Aers (Albuterol Sulfate) .... Inhale 2 Puffs Every 4 Hours As Needed 3)  Topamax 50 Mg Tabs (Topiramate) .Marland Kitchen.. 1 By Mouth Twice Daily 4)  Zocor 10 Mg Tabs (Simvastatin) .... Take 1 Tab By Mouth At Bedtime 5)  Pepcid 20 Mg Tabs (Famotidine) .... Take 1 Tablet By Mouth Two Times A Day As Needed For Indigestion 6)  Bactrim 400-80 Mg Tabs (Sulfamethoxazole-Trimethoprim) .... Take 1 Tablet By Mouth Two Times A Day For 10 Days 7)  Claritin 10 Mg Tabs (Loratadine) .... Take 1 Tablet By Mouth Once A Day  Allergies (verified): 1)  ! Darvocet 2)  ! Amoxicillin 3)  ! Penicillin 4)  Azithromycin (Azithromycin)  Physical Exam  General:  alert, well-developed, and well-nourished.   Head:  normocephalic and atraumatic.   Eyes:  pupils equal, pupils round, and pupils reactive to light.   Ears:  R ear normal and L ear normal.   Nose:  no external deformity.   Mouth:  pharynx pink and moist, no erythema, and no exudates.   Neck:  supple and no cervical lymphadenopathy.   Lungs:  no crackles, R wheezes, and L wheezes.   Heart:  normal rate and regular rhythm.   Neurologic:  alert & oriented X3 and cranial nerves II-XII intact.   Skin:  diffuse macular rash on right upper arm no excoriation few lesions noted at post scalp line on right  Psych:  normally interactive.     Impression & Recommendations:  Problem # 1:  CHRONIC OBSTRUCTIVE PULMONARY DISEASE, ACUTE EXACERBATION (ICD-491.21) doxy x 7 days pred taper proventil tessalon perles f/u 7-10 days  Problem # 2:  SKIN RASH, ALLERGIC (ICD-692.9) prob from detergent using prednisone for lungs and this should clear up skin discussed changing detergent to dye free f/u 7-10 days  Her updated medication list for  this problem includes:    Zyrtec Hives Relief 10 Mg Tabs (Cetirizine hcl) .Marland Kitchen... Take 1 tablet by mouth once a day for allergies    Prednisone 10 Mg Tabs (Prednisone) .Marland Kitchen... Take 6 tabs today, 5 on fri., 4 on sat, 3 on sun., 2 on mon., 1 on tues and stop  Problem # 3:  HYPERTENSION (ICD-401.9) controlled  Her updated medication list for this problem includes:    Toprol Xl 50 Mg Tb24 (Metoprolol succinate) .Marland Kitchen... 1 tablet by mouth daily  Orders: T-Comprehensive Metabolic Panel ([id number])  Problem # 4:  HYPERLIPIDEMIA (ICD-272.4) check labs  Her updated medication list for this problem includes:    Pravastatin Sodium 20 Mg Tabs (Pravastatin sodium) .Marland Kitchen... Take 1 tab by mouth at bedtime for cholesterol  Orders: T-Comprehensive Metabolic Panel ([id number])  Problem # 5:  COMMON MIGRAINE (ICD-346.10) refill topamax  Her updated medication list for this problem includes:    Toprol Xl 50 Mg Tb24 (Metoprolol succinate) .Marland Kitchen... 1 tablet by mouth daily  Problem # 6:  GERD (ICD-530.81) states pepcid not helping change to nexium  Her updated medication list for this problem includes:    Nexium 40 Mg Cpdr (Esomeprazole magnesium) .Marland Kitchen... Take 1 tablet by mouth once a day for indigestion  Complete Medication List: 1)  Toprol Xl 50 Mg Tb24 (Metoprolol succinate) .Marland Kitchen.. 1 tablet by mouth daily 2)  Ventolin Hfa 108 (90 Base) Mcg/act Aers (Albuterol sulfate) .... Inhale 2 puffs every 4 hours as needed 3)  Topamax 50 Mg Tabs (Topiramate) .Marland Kitchen.. 1 by mouth twice daily 4)  Pravastatin Sodium 20 Mg Tabs (Pravastatin sodium) .... Take 1 tab by mouth at bedtime for cholesterol 5)  Nexium 40 Mg Cpdr (Esomeprazole magnesium) .... Take 1 tablet by mouth once a day for indigestion 6)  Zyrtec Hives Relief 10 Mg Tabs (Cetirizine hcl) .... Take 1 tablet by mouth once a day for allergies 7)  Prednisone 10 Mg Tabs (Prednisone) .... Take 6 tabs today, 5 on fri., 4 on sat, 3 on sun., 2 on mon., 1 on tues and stop 8)   Doxycycline Hyclate 100 Mg Caps (Doxycycline hyclate) .... Take 1 capsule by mouth two times a day 9)  Tessalon Perles 100 Mg Caps (Benzonatate) .... Take 1 capsule by mouth three times a day as needed for cough  Patient Instructions: 1)  Take prednisone until all gone. 2)  Take Doxycycline until all gone. 3)  Use proventil around the clock for 3 days, then as needed. 4)  Use Tessalon Perles as needed. 5)  Schedule follow up in 7-10 days for recheck on lungs and rash. 6)  Return sooner  if you feel worse, or go the emergency room. Prescriptions: TOPAMAX 50 MG TABS (TOPIRAMATE) 1 by mouth twice daily  #30 x 5   Entered and Authorized by:   Tereso Newcomer PA-C   Signed by:   Tereso Newcomer PA-C on 09/09/2010   Method used:   Print then Give to Patient   RxID:   [id number] VENTOLIN HFA 108 (90 BASE) MCG/ACT  AERS (ALBUTEROL SULFATE) Inhale 2 puffs every 4 hours as needed  #1 x 11   Entered and Authorized by:   Tereso Newcomer PA-C   Signed by:   Tereso Newcomer PA-C on 09/09/2010   Method used:   Print then Give to Patient   RxID:   [id number] TOPROL XL 50 MG  TB24 (METOPROLOL SUCCINATE) 1 tablet by mouth daily  #30 x 5   Entered and Authorized by:   Tereso Newcomer PA-C   Signed by:   Tereso Newcomer PA-C on 09/09/2010   Method used:   Print then Give to Patient   RxID:   [id number] TESSALON PERLES 100 MG CAPS (BENZONATATE) Take 1 capsule by mouth three times a day as needed for cough  #30 x 0   Entered and Authorized by:   Tereso Newcomer PA-C   Signed by:   Tereso Newcomer PA-C on 09/09/2010   Method used:   Print then Give to Patient   RxID:   [id number] DOXYCYCLINE HYCLATE 100 MG CAPS (DOXYCYCLINE HYCLATE) Take 1 capsule by mouth two times a day  #14 x 0   Entered and Authorized by:   Tereso Newcomer PA-C   Signed by:   Tereso Newcomer PA-C on 09/09/2010   Method used:   Print then Give to Patient   RxID:   [id number] PREDNISONE 10 MG TABS (PREDNISONE) Take 6 tabs today, 5 on  Fri., 4 on Sat, 3 on Sun., 2 on Mon., 1 on Tues and stop  #21 x 0   Entered and Authorized by:   Tereso Newcomer PA-C   Signed by:   Tereso Newcomer PA-C on 09/09/2010   Method used:   Print then Give to Patient   RxID:   [id number] ZYRTEC HIVES RELIEF 10 MG TABS (CETIRIZINE HCL) Take 1 tablet by mouth once a day for allergies  #30 x 5   Entered and Authorized by:   Tereso Newcomer PA-C   Signed by:   Tereso Newcomer PA-C on 09/09/2010   Method used:   Print then Give to Patient   RxID:   [id number] PRAVASTATIN SODIUM 20 MG TABS (PRAVASTATIN SODIUM) Take 1 tab by mouth at bedtime for cholesterol  #30 x 5   Entered and Authorized by:   Tereso Newcomer PA-C   Signed by:   Tereso Newcomer PA-C on 09/09/2010   Method used:   Print then Give to Patient   RxID:   [id number] NEXIUM 40 MG CPDR (ESOMEPRAZOLE MAGNESIUM) Take 1 tablet by mouth once a day for indigestion  #30 x 3   Entered and Authorized by:   Tereso Newcomer PA-C   Signed by:   Tereso Newcomer PA-C on 09/09/2010   Method used:   Print then Give to Patient   RxID:   [id number]

## 2011-01-20 NOTE — Letter (Signed)
Summary: *HSN Results Follow up  [facility]  [address] [place], [place] [address]   Phone: [phone]  Fax: [phone]      09/10/2010   Summer Jackson [address] [place], [place]  [address]   Dear  Summer Jackson,                            ____S.Drinkard,FNP   ____D. Gore,FNP       ____B. McPherson,MD   ____V. Rankins,MD    ____E. Mulberry,MD    ____N. Daphine Deutscher, FNP  ____D. Reche Dixon, MD    ____K. Philipp Deputy, MD    __x__S. Alben Spittle, PA-C     This letter is to inform you that your recent test(s):  _______Pap Smear    ___x____Lab Test     _______X-ray    ___x____ is within acceptable limits  _______ requires a medication change  _______ requires a follow-up lab visit  _______ requires a follow-up visit with your provider   Comments:  Liver and Kidney function is normal.      _________________________________________________________ If you have any questions, please contact our office                     Sincerely,  Tereso Newcomer PA-C [facility]

## 2011-01-20 NOTE — Miscellaneous (Signed)
Summary: VIP  Patient: Summer Jackson Note: All result statuses are Final unless otherwise noted.  Tests: (1) VIP (Medications)   LLIMPORTMEDS              "Result Below..."       RESULT: VENTOLIN HFA AERS 108 (90 Base) MCG/ACT*INHALE 2 PUFFS EVERY 4 HOURS AS NEEDED*03/09/2007*Last Refill: ZOXWRUE*[id number]*******   LLIMPORTMEDS              "Result Below..."       RESULT: TOPROL XL TB24 50 MG*TAKE ONE (1) TABLET EVERY MORNING*08/03/2007*Last Refill: [id number]*******   LLIMPORTMEDS              "Result Below..."       RESULT: NAPROXEN TABS 500 MG*TAKE 1 TABLET BY MOUTH EVERY 12 HOURS AS NEEDED  FOR PAIN*08/03/2007*Last Refill: OZHYQMV*[id number]*******   LLIMPORTMEDS              "Result Below..."       RESULT: CYCLOBENZAPRINE 10MG  TABS*TAKE ONE-HALF TABLET AT BEDTIME*10/16/2007*Last Refill: Unknown********   LLIMPORTMEDS              "Result Below..."       RESULT: CRESTOR TABS 10 MG*TAKE ONE (1) TABLET BY MOUTH EVERY DAY*03/09/2007*Last Refill: GEXBMWU*[id number]*******   LLIMPORTMEDS              "Result Below..."       RESULT: CLOTRIMAZOLE-BETAMETHASONE CREA 1-0.05 %*APPLY TO AFFECTED AREA(S) 2 TIMES DAILY UNTIL CLEAR*08/03/2007*Last Refill: WNUUVOZ*[id number]*******   LLIMPORTMEDS              "Result Below..."       RESULT: ACIPHEX TBEC 20 MG*TAKE ONE (1) TABLET EACH DAY*03/09/2007*Last Refill: IHKVQQV*[id number]*******   LLIMPORTALLS              "Result Below..."       RESULT: AMOXICILLIN ORAL (AMOXIL)*1158**   LLIMPORTALLS              "Result Below..."       RESULT: MORPHINE SULFATE ER ORAL (MS VFIEPP)*29518**   LLIMPORTALLS              "Result Below..."       RESULT: PENICILLINS*16512**   LLIMPORTALLS              "Result Below..."       RESULT: PROPOXYPHENE*17956**  Note: An exclamation mark (!) indicates a result that was not dispersed into the flowsheet. Document Creation Date: 10/18/2007 3:07 PM _______________________________________________________________________  (1) Order result  status: Final Collection or observation date-time: 09/05/2007 Requested date-time: 09/05/2007 Receipt date-time:  Reported date-time: 09/05/2007 Referring Physician:   Ordering Physician:   Specimen Source:  Source: [place] Order Number:  Lab site:

## 2011-01-20 NOTE — Assessment & Plan Note (Signed)
Summary: Acute - Bronchitis/Right foot contusion   Vital Signs:  Patient Profile:   65 Years Old Female Height:     62 inches Weight:      172 pounds BMI:     31.57 BSA:     1.79 Temp:     97.3 degrees F oral Pulse rate:   72 / minute Resp:     16 per minute BP sitting:   140 / 80  (left arm) Cuff size:   regular  Pt. in pain?   yes    Location:   foot    Intensity:   8    Type:       aching  Vitals Entered By: Levon Hedger (February 08, 2008 9:13 AM)              Is Patient Diabetic? No  Does patient need assistance? Ambulation Normal     Visit Type:  Acute PCP:  Rankins  Chief Complaint:  cold and congestion, knot on right foot, and vomiting.  History of Present Illness:  URI - started 5 days ago.  no fever.  decreased appetite. Some vomiting but not diarrhea.  No sore throat.  Left ear pain. no headache.  cough is productive at times. no hemoptysis No sick contacts that she is aware of. Pt has taken tylenol at home for symptoms. Hx of chronic bronchitis No tobacco use but exposed with to other family members  Right foot pain - knot appeared about 1 week ago.  inflammation present since first appearance Denies any foot injury. Painful when she walks present today with clog shoes no treatment for foot.  denies previous problems  **[facility] of [facility].  pt did not get on yesturday**    Prior Medication List:  TOPROL XL 50 MG  TB24 (METOPROLOL SUCCINATE) 1 tablet by mouth daily ACIPHEX 20 MG  TBEC (RABEPRAZOLE SODIUM) take one tablet each day ZOCOR 20 MG TABS (SIMVASTATIN) by mouth once daily VENTOLIN HFA 108 (90 BASE) MCG/ACT  AERS (ALBUTEROL SULFATE) Inhale 2 puffs every 4 hours as needed ALLEGRA 180 MG  TABS (FEXOFENADINE HCL) take one tablet each day TOPAMAX 50 MG TABS (TOPIRAMATE) 1 by mouth bid * DURIDRIN  NAPROXEN 500 MG  TABS (NAPROXEN) 1 tab by mouth two times a day with food as needed pain  CLOTRIMAZOLE-BETAMETHASONE 1-0.05 % CREA (CLOTRIMAZOLE-BETAMETHASONE) APPLY TO AFFECTED AREA(S) 2 TIMES DAILY UNTIL CLEAR GUIATUSS AC 100-10 MG/5ML  SYRP (GUAIFENESIN-CODEINE) 2 teaaspoons every 6 hours as needed for cough   Current Allergies (reviewed today): ! DARVOCET ! AMOXICILLIN ! PENICILLIN    Risk Factors:  Tobacco use:  never Drug use:  no Caffeine use:  3 drinks per day Alcohol use:  no Seatbelt use:  100 % Sun Exposure:  occasionally  Mammogram History:    Date of Last Mammogram:  03/20/2006  PAP Smear History:    Date of Last PAP Smear:  08/09/2006   Review of Systems  General      Complains of loss of appetite and weakness.      Denies fever.  ENT      Complains of earache and nasal congestion.      Denies sore throat.      left  CV      Denies chest pain or discomfort, fatigue, and shortness of breath with exertion.  Resp      Denies cough and shortness of breath.  GI  Complains of vomiting.      Denies abdominal pain and nausea.  MS      knot on right foot   Physical Exam  General:     alert.   Head:     normocephalic.   Eyes:     pupils round.  glasses Ears:      R TM erythema and L TM erythema.   Nose:     no external deformity.   Mouth:     dentures pharynx pink and moist.   Lungs:     few scattered rhonchi Heart:     normal rate, regular rhythm, no murmur, and no gallop.   Abdomen:     soft, non-tender, and normal bowel sounds.   Msk:     up to exam table right ankle with tenderness at medial malleous, slight inflammation active ROM with rotation, flexion and extension Neurologic:     alert & oriented X3.   Psych:     flat affect.      Impression & Recommendations:  Problem # 1:  BRONCHITIS (ICD-490) Assessment: Comment Only Cough med sent to pharmacy on yesturday  will order z-pack plenty of liquids for hydration tylenol as needed for pain/fever Her updated medication list for this problem includes:     Ventolin Hfa 108 (90 Base) Mcg/act Aers (Albuterol sulfate) ..... Inhale 2 puffs every 4 hours as needed    Guiatuss Ac 100-10 Mg/41ml Syrp (Guaifenesin-codeine) .Marland Kitchen... 2 teaaspoons every 6 hours as needed for cough    Azithromycin 250 Mg Tabs (Azithromycin) .Marland Kitchen... 2 tablets by mouth on day one then 1 tablet by mouth daily   Problem # 2:  CONTUSION OF FOOT (ICD-924.20) apply ace bandage to affected area warm compresses  elevate  Complete Medication List: 1)  Toprol Xl 50 Mg Tb24 (Metoprolol succinate) .Marland Kitchen.. 1 tablet by mouth daily 2)  Aciphex 20 Mg Tbec (Rabeprazole sodium) .... Take one tablet each day 3)  Zocor 20 Mg Tabs (Simvastatin) .... By mouth once daily 4)  Ventolin Hfa 108 (90 Base) Mcg/act Aers (Albuterol sulfate) .... Inhale 2 puffs every 4 hours as needed 5)  Allegra 180 Mg Tabs (Fexofenadine hcl) .... Take one tablet each day 6)  Topamax 50 Mg Tabs (Topiramate) .Marland Kitchen.. 1 by mouth bid 7)  Duridrin  8)  Naproxen 500 Mg Tabs (Naproxen) .Marland Kitchen.. 1 tab by mouth two times a day with food as needed pain 9)  Clotrimazole-betamethasone 1-0.05 % Crea (Clotrimazole-betamethasone) .... Apply to affected area(s) 2 times daily until clear 10)  Guiatuss Ac 100-10 Mg/52ml Syrp (Guaifenesin-codeine) .... 2 teaaspoons every 6 hours as needed for cough 11)  Azithromycin 250 Mg Tabs (Azithromycin) .... 2 tablets by mouth on day one then 1 tablet by mouth daily   Patient Instructions: 1)  See if you already have an appointment scheduled on next week for chronic follow-up. 2)  Get ace bandage 2 inch for right ankle.  Apply during the day and remove at night. 3)  Warm compresses  twice daily (morning and night) until healed. 4)  Azithromycin - 2 tablets on Friday then one tablet daily until you finish  Only 1 prescription given to pt Prescriptions: AZITHROMYCIN 250 MG  TABS (AZITHROMYCIN) 2 tablets by mouth on day one then 1 tablet by mouth daily  #6 x 0   Entered and Authorized by:   Lehman Prom  FNP   Signed by:   Lehman Prom FNP on 02/08/2008   Method used:   Print  then Give to Patient   RxID:   [id number] GUIATUSS AC 100-10 MG/5ML  SYRP (GUAIFENESIN-CODEINE) 2 teaaspoons every 6 hours as needed for cough  #122ml x 0   Entered and Authorized by:   Lehman Prom FNP   Signed by:   Lehman Prom FNP on 02/08/2008   Method used:   Print then Give to Patient   RxID:   [id number] AZITHROMYCIN 250 MG  TABS (AZITHROMYCIN) 2 tablets by mouth on day one then 1 tablet by mouth daily  #6 x 0   Entered and Authorized by:   Lehman Prom FNP   Signed by:   Lehman Prom FNP on 02/08/2008   Method used:   Print then Give to Patient   RxID:   [id number]  ]

## 2011-01-20 NOTE — Progress Notes (Signed)
Summary: office visit  Phone Note Call from Patient Call back at [facility] Phone [phone]   Caller: Patient Summary of Call: The patient is worried because her urine is green.  She wants the nurse or the provider call her back. Dr Barbaraann Barthel [phone] Initial call taken by: Manon Hilding,  October 04, 2007 9:51 AM  Follow-up for Phone Call        Called pt.  urine no longer green.  Still taking nitrofurantoin.  No change yet in back pain--which may not be uti related as we discussed.  When comes back in for repeat ua, if no improvement in pain, discussed PT for musculoskeletal etiology, which clinically seemed to be the cause when pt. seen. Follow-up by: Julieanne Manson MD,  October 04, 2007 9:01 PM

## 2011-01-20 NOTE — Letter (Signed)
Summary: faxed records to [facility]  10/10/2007  faxed records to [facility]  10/10/2007   Imported By: Silvio Pate Stanislawscyk 10/11/2007 14:37:29  _____________________________________________________________________  External Attachment:    Type:   Image     Comment:   External Document

## 2011-01-20 NOTE — Miscellaneous (Signed)
Summary: HIPAA Restrictions  HIPAA Restrictions   Imported By: Florinda Marker 01/25/2010 10:39:05  _____________________________________________________________________  External Attachment:    Type:   Image     Comment:   External Document
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
## 2011-01-20 NOTE — Letter (Signed)
Summary: MAILED REQUESTED RECORDS TO [facility]  MAILED REQUESTED RECORDS TO [facility]   Imported By: Arta Bruce 06/11/2010 11:35:17  _____________________________________________________________________  External Attachment:    Type:   Image     Comment:   External Document

## 2011-01-20 NOTE — Assessment & Plan Note (Signed)
Summary: Acute - Sinusitis   Vital Signs:  Patient profile:   65 year old female Weight:      185.0 pounds BMI:     33.96 Temp:     97.1 degrees F oral Pulse rate:   72 / minute Pulse rhythm:   regular Resp:     16 per minute BP sitting:   140 / 100  (left arm) Cuff size:   regular  Vitals Entered By: Levon Hedger (November 10, 2010 2:16 PM)  Nutrition Counseling: Patient's BMI is greater than 25 and therefore counseled on weight management options. CC: sinus pressure and congestion that started yesterday Is Patient Diabetic? No Pain Assessment Patient in pain? yes     Location: head  Does patient need assistance? Functional Status Self care Ambulation Normal   Primary Care Provider:  Tereso Newcomer, PA-C  CC:  sinus pressure and congestion that started yesterday.  History of Present Illness:  Pt into the office with complaints of sinus pressure +frontal sinus pressure -fever -nausea and vomiting  +ear pain -sore throat +nasal congestion +hoarse  Pt admits that he was in the ER about 1 month ago for similar problems. Zyrtec is on her medications list but pt admits that she is not taking - "it is not amoung my medications at home"  Habits & Providers  Alcohol-Tobacco-Diet     Alcohol drinks/day: 0     Tobacco Status: never     Passive Smoke Exposure: yes  Exercise-Depression-Behavior     Does Patient Exercise: no     Drug Use: no     Seat Belt Use: 100     Sun Exposure: occasionally  Allergies (verified): 1)  ! Darvocet 2)  ! Amoxicillin 3)  ! Penicillin 4)  Azithromycin (Azithromycin)  Review of Systems General:  Denies fever. ENT:  Complains of earache, nasal congestion, and sinus pressure; denies sore throat. CV:  Denies chest pain or discomfort. Resp:  Denies cough. GI:  Denies nausea and vomiting.  Physical Exam  General:  alert.   Head:  normocephalic.   Ears:  bil TM with clear fluid present - on erythema Mouth:  pharynx pink and  moist.   Lungs:  normal breath sounds.   Heart:  normal rate and regular rhythm.   Abdomen:  normal bowel sounds.   Msk:  normal ROM.   Neurologic:  alert & oriented X3.   Skin:  color normal.   Psych:  Oriented X3.     Impression & Recommendations:  Problem # 1:  SINUSITIS (ICD-473.9) handout given pt to start antibiotics advise pt to take zyrtec daily The following medications were removed from the medication list:    Tessalon Perles 100 Mg Caps (Benzonatate) .Marland Kitchen... Take 1 capsule by mouth three times a day as needed for cough Her updated medication list for this problem includes:    Clarithromycin 500 Mg Tabs (Clarithromycin) ..... One tablet by mouth two times a day for infection  Complete Medication List: 1)  Toprol Xl 50 Mg Tb24 (Metoprolol succinate) .Marland Kitchen.. 1 tablet by mouth daily 2)  Ventolin Hfa 108 (90 Base) Mcg/act Aers (Albuterol sulfate) .... Inhale 2 puffs every 4 hours as needed 3)  Topamax 50 Mg Tabs (Topiramate) .Marland Kitchen.. 1 by mouth twice daily 4)  Pravastatin Sodium 20 Mg Tabs (Pravastatin sodium) .... Take 1 tab by mouth at bedtime for cholesterol 5)  Nexium 40 Mg Cpdr (Esomeprazole magnesium) .... Take 1 tablet by mouth once a day for indigestion 6)  Cetirizine Hcl 10 Mg Tabs (Cetirizine hcl) .... One tablet by mouth nightly for allergies 7)  Clarithromycin 500 Mg Tabs (Clarithromycin) .... One tablet by mouth two times a day for infection  Patient Instructions: 1)  Start clarithromycine 500mg  by mouth two times a day with food for infection for 7 days. 2)  Also take the allergy pill  - Cetirizine 10mg  by mouth nightly for allergies. 3)  Something keeps re-infecting you and inflamming your sinuses.  Take this so that your sinuses won't be inflammed and so you won't keep getting infection Prescriptions: CLARITHROMYCIN 500 MG TABS (CLARITHROMYCIN) One tablet by mouth two times a day for infection  #14 x 0   Entered and Authorized by:   Lehman Prom FNP   Signed by:    Lehman Prom FNP on 11/10/2010   Method used:   Print then Give to Patient   RxID:   [id number] CETIRIZINE HCL 10 MG TABS (CETIRIZINE HCL) One tablet by mouth nightly for allergies  #30 x 5   Entered and Authorized by:   Lehman Prom FNP   Signed by:   Lehman Prom FNP on 11/10/2010   Method used:   Print then Give to Patient   RxID:   [id number]    Orders Added: 1)  Est. Patient Level III [41324]

## 2011-01-20 NOTE — Progress Notes (Signed)
Summary: cough  Phone Note Call from Patient Call back at [facility] Phone [phone]   Caller: Patient Summary of Call: She wants to see if she can come sometime today because at this moment she is suffering from cough, nasal congestion and a knot right ankle. Dr. Barbaraann Barthel Initial call taken by: Manon Hilding,  February 06, 2008 10:19 AM  Follow-up for Phone Call        PT STATES REALLY CONGESTED AND GETS HOT AND COLD AND STATES SHE VOMITED ONE HOUR. PT WOULD LIKE SOMETHING CALLED INTO PHARMACY [facility] THIS HAS BEEN GOING ON FOR A COUPLE OF DAYS. [phone] Follow-up by: Leodis Rains,  February 07, 2008 2:31 PM  Additional Follow-up for Phone Call Additional follow up Details #1::        instruct pt that she needs bland diet to prevent further upset stomach. Rx printed for cough meds to send to pharamcy  Additional Follow-up by: Lehman Prom FNP,  February 07, 2008 2:39 PM    Additional Follow-up for Phone Call Additional follow up Details #2::    left message for pt to call the office Follow-up by: Levon Hedger,  February 07, 2008 3:40 PM  Additional Follow-up for Phone Call Additional follow up Details #3:: Details for Additional Follow-up Action Taken: pt seen in office today. Additional Follow-up by: Lehman Prom FNP,  February 08, 2008 10:24 AM  New/Updated Medications: GUIATUSS AC 100-10 MG/5ML  SYRP (GUAIFENESIN-CODEINE) 2 teaaspoons every 6 hours as needed for cough   Prescriptions: GUIATUSS AC 100-10 MG/5ML  SYRP (GUAIFENESIN-CODEINE) 2 teaaspoons every 6 hours as needed for cough  #176ml x 0   Entered and Authorized by:   Lehman Prom FNP   Signed by:   Lehman Prom FNP on 02/07/2008   Method used:   Printed then faxed to ...       [facility] [address] [phone]*       [address]       [place], [place]  [address]       Ph: [phone] or [phone]       Fax: [phone]   RxID:   [id number]

## 2011-01-20 NOTE — Progress Notes (Signed)
Summary: THINK SHE HAS SHINGLES  Phone Note Call from Patient Call back at [facility] Phone [phone]   Reason for Call: Talk to Nurse Summary of Call: MS Ellinger STOPPED AND IS IN THE FRONMT LOBBY, AND IS SAYING THAT SHE THINKS SHE HAS THE SHINGLES, SHE HAS BLISTERS ON HER FEET, IN HER HANDS AND ARM. Initial call taken by: Leodis Rains,  November 19, 2010 2:08 PM  Follow-up for Phone Call        In triage.  Dutch Quint RN  November 19, 2010 2:50 PM

## 2011-01-20 NOTE — Letter (Signed)
Summary: Handout Printed  Printed Handout:  - Sinusitis 
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
## 2011-01-20 NOTE — Progress Notes (Signed)
Summary: Topamax refill  Medications Added TOPROL XL 50 MG  TB24 (METOPROLOL SUCCINATE) 1 tablet by mouth daily       Phone Note Call from Patient   Reason for Call: Refill Medication Summary of Call: RANKINS PT.  NEEDS HER TOPAMAX REFILLED FOR HEADACHES AND [facility] [address] Initial call taken by: Leodis Rains,  December 19, 2007 3:53 PM  Follow-up for Phone Call        last filled 11/11/07 Follow-up by: Vesta Mixer CMA,  December 19, 2007 4:11 PM  Additional Follow-up for Phone Call Additional follow up Details #1::        will send meds to the pharmacy Additional Follow-up by: Lehman Prom FNP,  December 19, 2007 5:37 PM    New/Updated Medications: TOPROL XL 50 MG  TB24 (METOPROLOL SUCCINATE) 1 tablet by mouth daily   Prescriptions: TOPROL XL 50 MG  TB24 (METOPROLOL SUCCINATE) 1 tablet by mouth daily  #30 x 3   Entered and Authorized by:   Lehman Prom FNP   Signed by:   Lehman Prom FNP on 12/19/2007   Method used:   Electronically [facility] to ...       [facility] [address] [id number]       [address]       [place], [place]  [address]       Ph: [phone] or [phone]       Fax: [phone]   RxID:   [id number]

## 2011-01-20 NOTE — Progress Notes (Signed)
Summary: OFFICE VISIT  Phone Note Call from Patient Call back at [facility] Phone [phone]   Caller: Patient Summary of Call: THE PATIENT IS REQUESTING FOR AN OFFICE VISIT WITH HER PHYSICIAN.  SHE HAS PAIN IN HER LEFT KIDNEY AREA FOR 8 DAYS. DR Barbaraann Barthel  Initial call taken by: Manon Hilding,  September 24, 2007 3:12 PM  Follow-up for Phone Call        appt scheduled Follow-up by: Mikey College CMA,  September 28, 2007 2:50 PM

## 2011-01-20 NOTE — Progress Notes (Signed)
Summary: Schedule appt  Phone Note Other Incoming   Summary of Call: have patient return for follow up U/A in 4 weeks Initial call taken by: Brynda Rim,  September 10, 2009 3:16 PM  Follow-up for Phone Call        Ashby Dawes, Schedule pt to come in 4wks for UA(lab visit) Follow-up by: Mikey College CMA,  September 10, 2009 3:43 PM  Additional Follow-up for Phone Call Additional follow up Details #1::        I called the pt but she is in the processing to transfer her records to somewhere else.Manon Hilding  September 11, 2009 11:21 AM

## 2011-01-20 NOTE — Progress Notes (Signed)
Summary: Handicapped Placard for Permanent Disability  Phone Note Call from Patient Call back at Home Phone [phone]   Summary of Call: Ms. Littles would like the provider to filled out an application for handicapped placard for permanet disability.  You can reach her back at the number above. The reason why she needs this request is because she had 2 surgery in her left leg and she tends to get tired quickly.   Initial call taken by: Manon Hilding,  August 20, 2009 12:06 PM  Follow-up for Phone Call        NYKEDTRA,I PUT THIS REQUEST IN YOUR REFILL SLOT Follow-up by: Arta Bruce,  August 20, 2009 12:12 PM  Additional Follow-up for Phone Call Additional follow up Details #1::        this pt has not been seen since 09/2008 Rankins pt she needs to come establish with another provider prior to any forms being completed Additional Follow-up by: Lehman Prom FNP,  August 20, 2009 12:32 PM    Additional Follow-up for Phone Call Additional follow up Details #2::    Left message on answering machine for pt to call back..........Marland KitchenArmenia Shannon  August 20, 2009 1:50 PM    pt is notified about needing appt before filling out form....... appt is Sept 7...........Marland KitchenArmenia Shannon  August 21, 2009 2:56 PM

## 2011-01-20 NOTE — Assessment & Plan Note (Signed)
Summary: RASH///KT  Nurse Visit   Vital Signs:  Patient profile:   65 year old female Temp:     98.1 degrees F oral Pulse rate:   88 / minute Pulse rhythm:   regular Resp:     16 per minute BP sitting:   150 / 96  (right arm) Cuff size:   large  Vitals Entered By: Dutch Quint RN (November 19, 2010 3:01 PM)  Patient Instructions: 1)  Seen by Dr. Delrae Alfred 2)  Take Benadryl 25 mg. by mouth every 6 hours for the rash.  You can get this over-the-counter 3)  Use cooler water when you shower, avoid getting overheated. 4)  Make appointment to see your provider next week for follow-up. 5)  Call if your rash gets worse.   Primary Care Provider:  Tereso Newcomer, PA-C  CC:  Rash - thinks it's shingles.  History of Present Illness: Has diffuse rash over body, states mostly on hands and feet.  Thinks it's shingles.  Denies pain, states they just itch.  States she noticed rash 11/12/10 on feet, some areas were blisters, some flat, all itch.  Was started on clarithromycin on 11/10/10, now completed.   Clarithromycin completed 2 days ago. Rash has continued to worsen per pt.   Review of Systems       Denies SOB, nausea, vomiting, diarrhea. Derm:  Complains of itching and rash; Denies pain, just pruritic.Marland Kitchen   Physical Exam  Skin:  Circular and elliptical erythematous lesions, some raised scattered over body--noted most significantly on back and the majority run over and along the spine.  Some are scattered along what appears to be dermatomal lines on the back.  No significant lesions on anterior trunk.  These lesions blanch.  a large patch on the back of right thigh--about 5 times the size of other lesions.  Some vesicular lesions scattered on foearms and hands--more on palmar side.--few interdigital lesion.  Skin is very dry on hands.   Impression & Recommendations:  Problem # 1:  SKIN RASH (ICD-782.1) Possibly related to allergic reaction to Clarithromycin Does have a history of  allergy to Zithromax as well May be a coincidental pityriasis rosea, however To use Bendadryl for itching Call if worsens--can consider prednisone, though not a heavy rash at this point.  Complete Medication List: 1)  Toprol Xl 50 Mg Tb24 (Metoprolol succinate) .Marland Kitchen.. 1 tablet by mouth daily 2)  Ventolin Hfa 108 (90 Base) Mcg/act Aers (Albuterol sulfate) .... Inhale 2 puffs every 4 hours as needed 3)  Topamax 50 Mg Tabs (Topiramate) .Marland Kitchen.. 1 by mouth twice daily 4)  Pravastatin Sodium 20 Mg Tabs (Pravastatin sodium) .... Take 1 tab by mouth at bedtime for cholesterol 5)  Nexium 40 Mg Cpdr (Esomeprazole magnesium) .... Take 1 tablet by mouth once a day for indigestion 6)  Cetirizine Hcl 10 Mg Tabs (Cetirizine hcl) .... One tablet by mouth nightly for allergies 7)  Clarithromycin 500 Mg Tabs (Clarithromycin) .... One tablet by mouth two times a day for infection  CC: Rash - thinks it's shingles Is Patient Diabetic? No Pain Assessment Patient in pain? no       Does patient need assistance? Functional Status Self care Ambulation Normal   Allergies: 1)  ! Darvocet 2)  ! Amoxicillin 3)  ! Penicillin 4)  Azithromycin (Azithromycin) 5)  Clarithromycin (Clarithromycin)  Orders Added: 1)  Est. Patient Level I [30865]

## 2011-01-20 NOTE — Letter (Signed)
Summary: HANDICAPPED PLACARD /PICKED UP  HANDICAPPED PLACARD /PICKED UP   Imported ByArta Bruce 08/26/2009 11:29:28  _____________________________________________________________________  External Attachment:    Type:   Image     Comment:   External Document
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
## 2011-01-20 NOTE — Assessment & Plan Note (Signed)
Summary: RED SPOT ON STOMACH AND LEG//KT  Medications Added CLOTRIMAZOLE-BETAMETHASONE 1-0.05 % CREA (CLOTRIMAZOLE-BETAMETHASONE) APPLY TO AFFECTED AREA(S) 2 TIMES DAILY UNTIL CLEAR       Nurse Visit  pt c/o of still having red spot on skin.  Pt has two spots one on back of right thigh cirucular red rash about 2cm in diamter and one on lower part of abdomen not as red, but irritating to pt.  Per Dr. Delrae Alfred ok to refill clotrimazole/betamethsone to [facility] pharmacy and have pt schedule to see Dr. Barbaraann Barthel. ..................................................................Marland KitchenTiffany McCoy CMA  December 21, 2007 10:04 AM   Prior Medications: TOPROL XL 50 MG  TB24 (METOPROLOL SUCCINATE) 1 tablet by mouth daily ACIPHEX 20 MG  TBEC (RABEPRAZOLE SODIUM) take one tablet each day ZOCOR 20 MG TABS (SIMVASTATIN) by mouth once daily VENTOLIN HFA 108 (90 BASE) MCG/ACT  AERS (ALBUTEROL SULFATE) Inhale 2 puffs every 4 hours as needed ALLEGRA 180 MG  TABS (FEXOFENADINE HCL) take one tablet each day TOPAMAX 50 MG TABS (TOPIRAMATE) 1 by mouth bid DURIDRIN ()  NAPROXEN 500 MG  TABS (NAPROXEN) 1 tab by mouth two times a day with food as needed pain MACROBID 100 MG  CAPS (NITROFURANTOIN MONOHYD MACRO) 1 tab by mouth two times a day for 10 days. CLOTRIMAZOLE-BETAMETHASONE 1-0.05 % CREA (CLOTRIMAZOLE-BETAMETHASONE) APPLY TO AFFECTED AREA(S) 2 TIMES DAILY UNTIL CLEAR Current Allergies: ! DARVOCET ! AMOXICILLIN ! PENICILLIN    Orders Added: 1)  Est. Patient Level I [email or web address].Siad    ]

## 2011-01-20 NOTE — Letter (Signed)
Summary: Generic Letter  [facility]  [address] [place], [place] [address]   Phone: [phone]  Fax: [phone]    05/13/2008  JINAN BIGGINS [address] [place], [place]  [address]  Dear Ms. Thad Ranger,  Dr. Barbaraann Barthel has increased your Crestor to 20 mg daily. You may pick it up at the [facility]. It is important to take it every day in order to keep your cholesterol down. We have scheduled you for a fasting lab visit on July 14, 2008 at 8:30. If for any reason you are unable to keep this appointment please call our office.         Sincerely,   Gaylyn Cheers RN [facility]

## 2011-01-20 NOTE — Letter (Signed)
Summary: *HSN No Show Letter  [facility]  [address] [place], [place] [address]   Phone: [phone]  Fax: [phone]    07/14/2008 MRN: [id number]    [name] [address] [place], [place]  [address]   Dear  [name],   Re: No Show Policy         At this time [facility] clinic does have a no show policy. We encourage all of our patients to call if they are unable to make their appointments. This letter is to inform you that our records show you  have 3 or more No Shows for the calendar year. We value you as a patient here at [facility] and want to continue to provide the best healthcare possible. In the future if you are unable to keep an appointment please contact our office ASAP to cancel or reschedule your appointment. An excessive amount of No Shows could lead to possible dismissal from the practice. If you have any questions please contact our office at [phone].     Thank you for your cooperation.    Sincerely,   Arta Bruce [facility]

## 2011-01-20 NOTE — Progress Notes (Signed)
  Phone Note Call from Patient Call back at [facility] Phone [phone]   Caller: Patient Summary of Call: The patient came today for an appoitment and told me that the previous medication for her leg pain was not helping her.  She got again the same prenscription "clotrimazole"  so according to her, the nurse will call in for another medication. [employer]. [phone] Dr. Delrae Alfred Initial call taken by: Manon Hilding,  December 21, 2007 3:26 PM  Follow-up for Phone Call        No, Dr. Delrae Alfred wanted the same med called in. Follow-up by: Vesta Mixer CMA,  December 21, 2007 4:00 PM

## 2011-01-20 NOTE — Assessment & Plan Note (Signed)
Summary: FU ON RASH///KT  Nurse Visit   Vital Signs:  Patient profile:   65 year old female Temp:     97.2 degrees F oral Pulse rate:   76 / minute Pulse rhythm:   regular Resp:     20 per minute BP sitting:   144 / 90  (left arm) Cuff size:   large  Vitals Entered By: Dutch Quint RN (November 23, 2010 10:03 AM)  Impression & Recommendations:  Problem # 1:  SKIN RASH (ICD-782.1) Rash is not shingles -- still some vesicular areas to hands and feet Overall, greatly improved Suggested possibility of insect bites, to thoroughly clean bed linens and furniture Hydrocortisone cream OTC to affected areas as needed   Orders: Est. Patient Level I (04540)  Complete Medication List: 1)  Toprol Xl 50 Mg Tb24 (Metoprolol succinate) .Marland Kitchen.. 1 tablet by mouth daily 2)  Ventolin Hfa 108 (90 Base) Mcg/act Aers (Albuterol sulfate) .... Inhale 2 puffs every 4 hours as needed 3)  Topamax 50 Mg Tabs (Topiramate) .Marland Kitchen.. 1 by mouth twice daily 4)  Pravastatin Sodium 20 Mg Tabs (Pravastatin sodium) .... Take 1 tab by mouth at bedtime for cholesterol 5)  Nexium 40 Mg Cpdr (Esomeprazole magnesium) .... Take 1 tablet by mouth once a day for indigestion 6)  Cetirizine Hcl 10 Mg Tabs (Cetirizine hcl) .... One tablet by mouth nightly for allergies 7)  Clarithromycin 500 Mg Tabs (Clarithromycin) .... One tablet by mouth two times a day for infection   Patient Instructions: 1)  Seen by N.Martin 2)  Your rash is not shingles. 3)  Check your furniture for things that would bite -- fleas, bedbugs.  You can wash your bed linens and pillows in hot water which would help get rid of bed bugs.  Vacuum thoroughly, including your furniture. 4)  You can get hydrocortisone cream over the counter and apply as needed to the affected areas for itching. 5)  Call if anything changes or if you have any questions.   Primary Care Provider:  Tereso Newcomer, PA-C  CC:  Recheck on rash.  History of Present Illness: 11/19/10 Had  rash s/p antibiotic treatment.  Vesicular rash to hands and feet, diffuse, reddened flat rash to generalized body.  Here today for recheck -- flat rash is essentially gone, but still has some vesicles to hands and feet, states one was on lip and broke open.  Still thinks it is shingles.  Denies pain except to open areas, still some itching, has been using Benadryl OTC, none this morning.   Review of Systems Derm:  Complains of rash; Overall rash is gone, some vesicular rash persists..   Physical Exam  Skin:  Few dark red vesicles to hands and feet, one or two clear vesicles noted to hands.  Several healing scabbed lesions noted.  CC: Recheck on rash Is Patient Diabetic? No Pain Assessment Patient in pain? no       Does patient need assistance? Functional Status Self care Ambulation Normal   Allergies: 1)  ! Darvocet 2)  ! Amoxicillin 3)  ! Penicillin 4)  Azithromycin (Azithromycin) 5)  Clarithromycin (Clarithromycin)  Orders Added: 1)  Est. Patient Level I [98119]

## 2011-02-21 ENCOUNTER — Telehealth (INDEPENDENT_AMBULATORY_CARE_PROVIDER_SITE_OTHER): Payer: Self-pay | Admitting: Nurse Practitioner

## 2011-02-28 ENCOUNTER — Encounter (INDEPENDENT_AMBULATORY_CARE_PROVIDER_SITE_OTHER): Payer: Self-pay | Admitting: Nurse Practitioner

## 2011-03-02 LAB — URINALYSIS, ROUTINE W REFLEX MICROSCOPIC
Bilirubin Urine: NEGATIVE
Glucose, UA: NEGATIVE mg/dL
Hgb urine dipstick: NEGATIVE
Ketones, ur: NEGATIVE mg/dL
Nitrite: NEGATIVE
Protein, ur: NEGATIVE mg/dL
Specific Gravity, Urine: 1.011 (ref 1.005–1.030)
Urobilinogen, UA: 0.2 mg/dL (ref 0.0–1.0)
pH: 7.5 (ref 5.0–8.0)

## 2011-03-02 LAB — POCT CARDIAC MARKERS
CKMB, poc: 1.3 ng/mL (ref 1.0–8.0)
Myoglobin, poc: 76.3 ng/mL (ref 12–200)
Troponin i, poc: <0.05 ng/mL (ref 0.00–0.09)

## 2011-03-02 LAB — DIFFERENTIAL
Basophils Absolute: 0.1 10*3/uL (ref 0.0–0.1)
Basophils Relative: 1 % (ref 0–1)
Eosinophils Absolute: 0.5 10*3/uL (ref 0.0–0.7)
Eosinophils Relative: 5 % (ref 0–5)
Lymphocytes Relative: 30 % (ref 12–46)
Lymphs Abs: 2.7 10*3/uL (ref 0.7–4.0)
Monocytes Absolute: 0.6 10*3/uL (ref 0.1–1.0)
Monocytes Relative: 6 % (ref 3–12)
Neutro Abs: 5.2 10*3/uL (ref 1.7–7.7)
Neutrophils Relative %: 58 % (ref 43–77)

## 2011-03-02 LAB — CBC
HCT: 40.4 % (ref 36.0–46.0)
Hemoglobin: 13.7 g/dL (ref 12.0–15.0)
MCH: 29.4 pg (ref 26.0–34.0)
MCHC: 34 g/dL (ref 30.0–36.0)
MCV: 86.7 fL (ref 78.0–100.0)
Platelets: 232 10*3/uL (ref 150–400)
RBC: 4.66 MIL/uL (ref 3.87–5.11)
RDW: 13.5 % (ref 11.5–15.5)
WBC: 9.1 10*3/uL (ref 4.0–10.5)

## 2011-03-02 LAB — BASIC METABOLIC PANEL
BUN: 12 mg/dL (ref 6–23)
CO2: 23 mEq/L (ref 19–32)
Calcium: 8.9 mg/dL (ref 8.4–10.5)
Chloride: 108 mEq/L (ref 96–112)
Creatinine, Ser: 0.65 mg/dL (ref 0.4–1.2)
GFR calc Af Amer: >60 mL/min (ref >60)
GFR calc non Af Amer: >60 mL/min (ref >60)
Glucose, Bld: 98 mg/dL (ref 70–99)
Potassium: 3.9 mEq/L (ref 3.5–5.1)
Sodium: 141 mEq/L (ref 135–145)

## 2011-03-02 LAB — APTT: aPTT: 31 seconds (ref 24–37)

## 2011-03-02 LAB — PROTIME-INR
INR: 0.96 (ref 0.00–1.49)
Prothrombin Time: 13 seconds (ref 11.6–15.2)

## 2011-03-08 NOTE — Letter (Signed)
Summary: Generic Letter  [facility]  [address] [place], [place] [address]   Phone: [phone]  Fax: [phone]    02/28/2011  MICHELL GIULIANO [address] [place], [place]  [address]  Dear Ms. Thad Ranger,  We have been unable to contact you by telephone.  Please call our office, at your earliest convenience, so that we may speak with you regarding your recent call to this office.   Sincerely,   Dutch Quint RN

## 2011-03-08 NOTE — Progress Notes (Signed)
Summary: WANTS APPT ASAP  Phone Note Call from Patient Call back at Home Phone [phone]   Reason for Call: Acute Illness Summary of Call: PATIENT CALLED TO UPDATED HER GCCN CARD THEN PROCEDED TO SAY SHE WANTS TO SEE THE DR PATIENT SAID SHE'S SPITTING OUT GREEN STUFF. PLS CALL ASAP. Initial call taken by: Ayesha Rumpf,  February 21, 2011 10:58 AM  Follow-up for Phone Call        Left message on answering machine for pt. to return call.  Dutch Quint RN  February 21, 2011 6:12 PM  Left message on answering machine for pt. to return call.  Dutch Quint RN  February 22, 2011 5:26 PM  Left message on answering machine for pt. to return call.  Dutch Quint RN  February 24, 2011 2:50 PM  Sent letter.  Dutch Quint RN  February 28, 2011 6:31 PM

## 2011-05-06 NOTE — Op Note (Signed)
NAME:  Summer Jackson, Summer Jackson NO.:  [id number]   MEDICAL RECORD NO.:  [id number]          PATIENT TYPE:  AMB   LOCATION:  ENDO                         FACILITY:  [facility]   PHYSICIAN:  Shirley Friar, MDDATE OF BIRTH:  01-01-1946   DATE OF PROCEDURE:  12/15/2006  DATE OF DISCHARGE:                               OPERATIVE REPORT   PROCEDURE:  Colonoscopy.   ENDOSCOPIST:  Shirley Friar, MD.   INDICATION:  Screening.   MEDICATIONS:  Fentanyl 75 mcg IV, Versed 6 mg IV.   FINDINGS:  Rectal exam was normal.   DESCRIPTION OF PROCEDURE:  A standard adult colonoscope was inserted  into an adequately prepped colon and advanced to the cecum where the  ileocecal valve and appendiceal orifice were identified.  On careful  withdrawal of the colonoscope; it reveals diffuse small and large  diverticulosis.  No other mucosal abnormalities were noted.  Retroflexion was done; it was unremarkable.   ASSESSMENT:  Diffuse diverticulosis, otherwise normal colonoscopy.   PLAN:  1. High-fiber diet.  2. Repeat colonoscopy in 5 years due to family history of colon cancer      (father had colorectal cancer in his 55s).      Shirley Friar, MD  Electronically Signed     VCS/MEDQ  D:  12/15/2006  T:  12/15/2006  Job:  [id number]

## 2011-12-21 ENCOUNTER — Encounter (HOSPITAL_COMMUNITY): Payer: Self-pay | Admitting: *Deleted

## 2011-12-22 ENCOUNTER — Encounter (HOSPITAL_COMMUNITY): Payer: Self-pay | Admitting: *Deleted

## 2011-12-22 ENCOUNTER — Ambulatory Visit (HOSPITAL_COMMUNITY)
Admission: RE | Admit: 2011-12-22 | Discharge: 2011-12-22 | Disposition: A | Discharge status: Home / Self Care | Payer: Self-pay | Source: Ambulatory Visit | Attending: Gastroenterology | Admitting: Gastroenterology

## 2011-12-22 ENCOUNTER — Encounter (HOSPITAL_COMMUNITY)
Admission: RE | Disposition: A | Discharge status: Home / Self Care | Payer: Self-pay | Source: Ambulatory Visit | Attending: Gastroenterology

## 2011-12-22 DIAGNOSIS — I10 Essential (primary) hypertension: Secondary | ICD-10-CM | POA: Insufficient documentation

## 2011-12-22 DIAGNOSIS — K5730 Diverticulosis of large intestine without perforation or abscess without bleeding: Secondary | ICD-10-CM | POA: Insufficient documentation

## 2011-12-22 DIAGNOSIS — K625 Hemorrhage of anus and rectum: Secondary | ICD-10-CM

## 2011-12-22 DIAGNOSIS — Z79899 Other long term (current) drug therapy: Secondary | ICD-10-CM | POA: Insufficient documentation

## 2011-12-22 DIAGNOSIS — K219 Gastro-esophageal reflux disease without esophagitis: Secondary | ICD-10-CM | POA: Insufficient documentation

## 2011-12-22 DIAGNOSIS — Z800 Family history of malignant neoplasm of digestive organs: Secondary | ICD-10-CM | POA: Insufficient documentation

## 2011-12-22 DIAGNOSIS — R1032 Left lower quadrant pain: Secondary | ICD-10-CM | POA: Insufficient documentation

## 2011-12-22 DIAGNOSIS — K648 Other hemorrhoids: Secondary | ICD-10-CM | POA: Insufficient documentation

## 2011-12-22 HISTORY — DX: Essential (primary) hypertension: I10

## 2011-12-22 HISTORY — DX: Headache: R51

## 2011-12-22 HISTORY — DX: Gastro-esophageal reflux disease without esophagitis: K21.9

## 2011-12-22 HISTORY — DX: Hyperlipidemia, unspecified: E78.5

## 2011-12-22 HISTORY — PX: COLONOSCOPY: SHX5424

## 2011-12-22 SURGERY — COLONOSCOPY
Anesthesia: Moderate Sedation

## 2011-12-22 MED ORDER — FENTANYL CITRATE 0.05 MG/ML IJ SOLN
INTRAMUSCULAR | Status: AC
  Filled 2011-12-22: qty 2

## 2011-12-22 MED ORDER — MIDAZOLAM HCL 10 MG/2ML IJ SOLN
INTRAMUSCULAR | Status: AC
  Filled 2011-12-22: qty 2

## 2011-12-22 MED ORDER — SODIUM CHLORIDE 0.9 % IV SOLN
Freq: Once | INTRAVENOUS | Status: AC
  Administered 2011-12-22: 500 mL via INTRAVENOUS

## 2011-12-22 MED ORDER — MIDAZOLAM HCL 10 MG/2ML IJ SOLN
INTRAMUSCULAR | Status: DC | PRN
  Administered 2011-12-22: 2 mg via INTRAVENOUS
  Administered 2011-12-22 (×3): 1 mg via INTRAVENOUS
  Administered 2011-12-22: 2 mg via INTRAVENOUS
  Administered 2011-12-22: 1 mg via INTRAVENOUS
  Administered 2011-12-22: 2 mg via INTRAVENOUS

## 2011-12-22 MED ORDER — FENTANYL NICU IV SYRINGE 50 MCG/ML
INJECTION | INTRAMUSCULAR | Status: DC | PRN
  Administered 2011-12-22 (×5): 25 ug via INTRAVENOUS

## 2011-12-22 NOTE — Op Note (Signed)
[facility] [address] [place], [place]  [address]  COLONOSCOPY PROCEDURE REPORT  PATIENT:  Summer Jackson, Summer Jackson  MR#:  [id number] BIRTHDATE:  [birth year], 65 yrs. old  GENDER:  female ENDOSCOPIST:  Charlott Rakes, MD REF. BY: PROCEDURE DATE:  12/22/2011 PROCEDURE:  Colonoscopy 62952 ASA CLASS:  Class II INDICATIONS:  rectal bleeding, family history of colon cancer MEDICATIONS:   Fentanyl 125 mcg IV, Versed 10 mg IV  DESCRIPTION OF PROCEDURE:   After the risks benefits and alternatives of the procedure were thoroughly explained, informed consent was obtained.  The Pentax Colonoscope [id number] endoscope was introduced through the anus and advanced to the cecum, which was identified by both the appendix and ileocecal valve, limited by a tortuous colon, poor preparation.    The quality of the prep was poor..  The instrument was then slowly withdrawn as the colon was fully examined. <<PROCEDUREIMAGES>>  FINDINGS:  Rectal exam unremarkable.  Pediatric colonoscope inserted into the colon and advanced to the cecum, where the appendiceal orifice and ileocecal valve were identified although the view was limited due to poor prep of the cecum and ascending colon.   On careful withdrawal of the colonoscope scattered sigmoid diverticuli were noted   Retroflexion revealed small internal hemorrhoids.  COMPLICATIONS:  None  IMPRESSION:   1. Sigmoid diverticulosis 2. Internal hemorrhoids 3. Poor prep  RECOMMENDATIONS:   1. High fiber diet 2. Air contrast barium enema to be scheduled for the near future  ______________________________ Charlott Rakes, MD  CC:  n. eSIGNEDCharlott Rakes at 12/22/2011 04:28 PM  Polly Cobia, [phone]

## 2011-12-22 NOTE — Brief Op Note (Signed)
See endopro note 

## 2011-12-22 NOTE — H&P (Signed)
  See scanned H and P note dated. 12/09/11.  Date of Initial H&P: 12/09/11  History reviewed, patient examined, no change in status, stable for surgery.

## 2011-12-22 NOTE — Interval H&P Note (Signed)
History and Physical Interval Note:  12/22/2011 3:31 PM  Summer Jackson  has presented today for surgery, with the diagnosis of rectal bleeding/family hx colon CA  The various methods of treatment have been discussed with the patient and family. After consideration of risks, benefits and other options for treatment, the patient has consented to  Procedure(s): COLONOSCOPY as a surgical intervention .  The patients' history has been reviewed, patient examined, no change in status, stable for surgery.  I have reviewed the patients' chart and labs.  Questions were answered to the patient's satisfaction.     Elon Eoff C.

## 2011-12-26 ENCOUNTER — Encounter (HOSPITAL_COMMUNITY): Payer: Self-pay | Admitting: Gastroenterology

## 2012-01-10 ENCOUNTER — Other Ambulatory Visit (HOSPITAL_COMMUNITY): Payer: Self-pay | Admitting: Gastroenterology

## 2012-01-10 DIAGNOSIS — K625 Hemorrhage of anus and rectum: Secondary | ICD-10-CM

## 2012-01-18 ENCOUNTER — Ambulatory Visit (HOSPITAL_COMMUNITY)
Admission: RE | Admit: 2012-01-18 | Discharge: 2012-01-18 | Disposition: A | Discharge status: Home / Self Care | Payer: Self-pay | Source: Ambulatory Visit | Attending: Gastroenterology | Admitting: Gastroenterology

## 2012-01-18 DIAGNOSIS — M4120 Other idiopathic scoliosis, site unspecified: Secondary | ICD-10-CM | POA: Insufficient documentation

## 2012-01-18 DIAGNOSIS — K5730 Diverticulosis of large intestine without perforation or abscess without bleeding: Secondary | ICD-10-CM | POA: Insufficient documentation

## 2012-01-18 DIAGNOSIS — K625 Hemorrhage of anus and rectum: Secondary | ICD-10-CM | POA: Insufficient documentation

## 2012-01-18 DIAGNOSIS — R109 Unspecified abdominal pain: Secondary | ICD-10-CM | POA: Insufficient documentation

## 2012-01-20 ENCOUNTER — Encounter (INDEPENDENT_AMBULATORY_CARE_PROVIDER_SITE_OTHER): Payer: Self-pay | Admitting: General Surgery

## 2012-01-23 ENCOUNTER — Encounter (INDEPENDENT_AMBULATORY_CARE_PROVIDER_SITE_OTHER): Payer: Self-pay | Admitting: Surgery

## 2012-01-23 ENCOUNTER — Ambulatory Visit (INDEPENDENT_AMBULATORY_CARE_PROVIDER_SITE_OTHER): Payer: PRIVATE HEALTH INSURANCE | Admitting: Surgery

## 2012-01-23 VITALS — BP 130/82 | HR 83 | Temp 98.6°F | Ht 64.5 in | Wt 180.2 lb

## 2012-01-23 DIAGNOSIS — K649 Unspecified hemorrhoids: Secondary | ICD-10-CM | POA: Insufficient documentation

## 2012-01-23 DIAGNOSIS — D1739 Benign lipomatous neoplasm of skin and subcutaneous tissue of other sites: Secondary | ICD-10-CM

## 2012-01-23 DIAGNOSIS — D171 Benign lipomatous neoplasm of skin and subcutaneous tissue of trunk: Secondary | ICD-10-CM

## 2012-01-23 DIAGNOSIS — K625 Hemorrhage of anus and rectum: Secondary | ICD-10-CM

## 2012-01-23 HISTORY — DX: Benign lipomatous neoplasm of skin and subcutaneous tissue of trunk: D17.1

## 2012-01-23 MED ORDER — HYDROCORTISONE ACE-PRAMOXINE 2.5-1 % RE CREA: TOPICAL_CREAM | Freq: Three times a day (TID) | RECTAL | Status: AC

## 2012-01-23 NOTE — Patient Instructions (Signed)
Hemorrhoids Hemorrhoids are enlarged (dilated) veins around the rectum. There are 2 types of hemorrhoids, and the type of hemorrhoid is determined by its location. Internal hemorrhoids occur in the veins just inside the rectum.They are usually not painful, but they may bleed.However, they may poke through to the outside and become irritated and painful. External hemorrhoids involve the veins outside the anus and can be felt as a painful swelling or hard lump near the anus.They are often itchy and may crack and bleed. Sometimes clots will form in the veins. This makes them swollen and painful. These are called thrombosed hemorrhoids. CAUSES Causes of hemorrhoids include:  Pregnancy. This increases the pressure in the hemorrhoidal veins.   Constipation.   Straining to have a bowel movement.   Obesity.   Heavy lifting or other activity that caused you to strain.  TREATMENT Most of the time hemorrhoids improve in 1 to 2 weeks. However, if symptoms do not seem to be getting better or if you have a lot of rectal bleeding, your caregiver may perform a procedure to help make the hemorrhoids get smaller or remove them completely.Possible treatments include:  Rubber band ligation. A rubber band is placed at the base of the hemorrhoid to cut off the circulation.   Sclerotherapy. A chemical is injected to shrink the hemorrhoid.   Infrared light therapy. Tools are used to burn the hemorrhoid.   Hemorrhoidectomy. This is surgical removal of the hemorrhoid.  HOME CARE INSTRUCTIONS   Increase fiber in your diet. Ask your caregiver about using fiber supplements.   Drink enough water and fluids to keep your urine clear or pale yellow.   Exercise regularly.   Go to the bathroom when you have the urge to have a bowel movement. Do not wait.   Avoid straining to have bowel movements.   Keep the anal area dry and clean.   Only take over-the-counter or prescription medicines for pain, discomfort,  or fever as directed by your caregiver.  If your hemorrhoids are thrombosed:  Take warm sitz baths for 20 to 30 minutes, 3 to 4 times per day.   If the hemorrhoids are very tender and swollen, place ice packs on the area as tolerated. Using ice packs between sitz baths may be helpful. Fill a plastic bag with ice. Place a towel between the bag of ice and your skin.   Medicated creams and suppositories may be used or applied as directed.   Do not use a donut-shaped pillow or sit on the toilet for long periods. This increases blood pooling and pain.  SEEK MEDICAL CARE IF:   You have increasing pain and swelling that is not controlled with your medicine.   You have uncontrolled bleeding.   You have difficulty or you are unable to have a bowel movement.   You have pain or inflammation outside the area of the hemorrhoids.   You have chills or an oral temperature above 102 F (38.9 C).  MAKE SURE YOU:   Understand these instructions.   Will watch your condition.   Will get help right away if you are not doing well or get worse.  Document Released: 12/02/2000 Document Revised: 08/17/2011 Document Reviewed: 04/08/2008 [facility] Patient Information 2012 [place], [place].  Lipoma A lipoma is a noncancerous (benign) tumor composed of fat cells. They are usually found under the skin (subcutaneous). A lipoma may occur in any tissue of the body that contains fat. Common areas for lipomas to appear include the back, shoulders, buttocks,  and thighs. Lipomas are a very common soft tissue growth. They are soft and grow slowly. Most problems caused by a lipoma depend on where it is growing. DIAGNOSIS  A lipoma can be diagnosed with a physical exam. These tumors rarely become cancerous, but radiographic studies can help determine this for certain. Studies used may include:  Computerized X-ray scans (CT or CAT scan).   Computerized magnetic scans (MRI).  TREATMENT  Small lipomas that are not causing  problems may be watched. If a lipoma continues to enlarge or causes problems, removal is often the best treatment. Lipomas can also be removed to improve appearance. Surgery is done to remove the fatty cells and the surrounding capsule. Most often, this is done with medicine that numbs the area (local anesthetic). The removed tissue is examined under a microscope to make sure it is not cancerous. Keep all follow-up appointments with your caregiver. SEEK MEDICAL CARE IF:   The lipoma becomes larger or hard.   The lipoma becomes painful, red, or increasingly swollen. These could be signs of infection or a more serious condition.  Document Released: 11/25/2002 Document Revised: 08/17/2011 Document Reviewed: 05/07/2010 [facility] Patient Information 2012 [place], [place].

## 2012-01-23 NOTE — Progress Notes (Signed)
Patient ID: Summer Jackson, female   DOB: 08/05/1946, 65 y.o.   MRN: [id number]  Chief Complaint  Patient presents with  . Pre-op Exam    rectal bleeding    HPI Summer Jackson is a 65 y.o. female.   HPI The patient is sent at the request of Dr.Devani due to mass located on her anterior abdominal wall. This measures 3 cm. It is tender to palpation. She also has a complaint of rectal bleeding. She underwent colonoscopy last month by Dr. Schoonover which showed diverticulosis and internal hemorrhoids. She's having more pain over the mass overlying her abdominal wall especially with palpation. She also has no rectal bleeding which is increased over the last 4 months. No burning or itching.  Past Medical History  Diagnosis Date  . Hypertension   . Hyperlipemia   . Headache     migraine  . GERD (gastroesophageal reflux disease)   . Rectal bleeding   . Arthritis   . Blood transfusion   . COPD (chronic obstructive pulmonary disease)   . Weight loss   . Nasal congestion   . Wheezing     Past Surgical History  Procedure Date  . Heart surgery at 8yr   . Appendectomy   . Colonoscopy   . Left ovary and fallopian tube removed   . Colonoscopy 12/22/2011    Procedure: COLONOSCOPY;  Surgeon: Vincent C. Schooler, MD;  Location: [facility] ENDOSCOPY;  Service: Endoscopy;  Laterality: N/A;  . Ovarian tumor   . Breast tumor     left    Family History  Problem Relation Age of Onset  . Colon cancer Father   . Cancer Father     colon  . Malignant hyperthermia Neg Hx     Social History History  Substance Use Topics  . Smoking status: Former Smoker    Quit date: 08/23/2011  . Smokeless tobacco: Not on file  . Alcohol Use: No    Allergies  Allergen Reactions  . Amoxicillin     REACTION: headache  . Azithromycin     REACTION: reddness around mouth  . Clarithromycin     REACTION: skin rash with minimal vesicular formation on arms and hands  . Penicillins     REACTION: headache  .  Propoxyphene N-Acetaminophen     REACTION: mouth sores    Current Outpatient Prescriptions  Medication Sig Dispense Refill  . esomeprazole (NEXIUM) 40 MG capsule Take 40 mg by mouth daily before breakfast. DELAYED RELEASE      . fexofenadine-pseudoephedrine (ALLEGRA-D 24) 180-240 MG per 24 hr tablet Take 1 tablet by mouth daily.        . metoprolol (TOPROL-XL) 50 MG 24 hr tablet Take 50 mg by mouth daily.        . pravastatin (PRAVACHOL) 20 MG tablet Take 20 mg by mouth daily.      . RABEprazole (ACIPHEX) 20 MG tablet Take 20 mg by mouth daily.        . simvastatin (ZOCOR) 10 MG tablet Take 10 mg by mouth at bedtime. No dosage listed, in evening       . topiramate (TOPAMAX) 50 MG tablet Take 50 mg by mouth 2 (two) times daily.        . hydrocortisone-pramoxine (ANALPRAM-HC) 2.5-1 % rectal cream Place rectally 3 (three) times daily.  30 g  0    Review of Systems Review of Systems  Constitutional: Negative for fever, chills and unexpected weight change.  HENT: Negative for hearing loss, congestion,   sore throat, trouble swallowing and voice change.   Eyes: Negative for visual disturbance.  Respiratory: Negative for cough and wheezing.   Cardiovascular: Negative for chest pain, palpitations and leg swelling.  Gastrointestinal: Negative for nausea, vomiting, abdominal pain, diarrhea, constipation, blood in stool, abdominal distention and anal bleeding.  Genitourinary: Negative for hematuria, vaginal bleeding and difficulty urinating.  Musculoskeletal: Negative for arthralgias.  Skin: Negative for rash and wound.  Neurological: Negative for seizures, syncope and headaches.  Hematological: Negative for adenopathy. Does not bruise/bleed easily.  Psychiatric/Behavioral: Negative for confusion.    Blood pressure 130/82, pulse 83, temperature 98.6 F (37 C), temperature source Temporal, height 5' 4.5" (1.638 m), weight 180 lb 3.2 oz (81.738 kg), SpO2 97.00%.  Physical Exam Physical Exam    Constitutional: She is oriented to person, place, and time. She appears well-developed and well-nourished.  HENT:  Head: Normocephalic and atraumatic.  Eyes: EOM are normal. Pupils are equal, round, and reactive to light.  Neck: Normal range of motion. Neck supple.  Cardiovascular: Normal rate and regular rhythm.   Pulmonary/Chest: Effort normal. She has wheezes.  Abdominal: Soft.    Neurological: She is alert and oriented to person, place, and time. GCS eye subscore is 4. GCS verbal subscore is 5. GCS motor subscore is 6.  Skin: Skin is warm, dry and intact.  Psychiatric: Her speech is normal and behavior is normal. Judgment and thought content normal. Her mood appears anxious. Cognition and memory are normal.   Anoscopy was performed. 3 column internal hemorrhoid disease noted grade 2.   Data Reviewed Colonoscopy internal hemorrhoids sigmoid diverticulosis   Assessment    Lipoma abdominal wall 3 cm Internal hemorrhoids  Grade 2    Plan    Excision of symptomatic abdominal wall lipoma measuring 3 cm.The procedure has been discussed with the patient.  Alternative therapies have been discussed with the patient.  Operative risks include bleeding,  Infection,  Organ injury,  Nerve injury,  Blood vessel injury,  DVT,  Pulmonary embolism,  Death,  And possible reoperation.  Medical management risks include worsening of present situation.  The success of the procedure is 50 -90 % at treating patients symptoms.  The patient understands and agrees to proceed.  Medical management of internal hemorrhoid disease for now until after lipoma removed. Analpram prescribed. High fiber diet recommended.         Draydon Clairmont A. 01/23/2012, 3:33 PM    

## 2012-01-27 ENCOUNTER — Telehealth (INDEPENDENT_AMBULATORY_CARE_PROVIDER_SITE_OTHER): Payer: Self-pay | Admitting: Surgery

## 2012-01-27 NOTE — Telephone Encounter (Signed)
MS Denley LEFT A MESSAGE RE RX THAT DR. Luisa Hart WROTE FOR HER RECTAL AREA. SHE SAID "IT WAS TOO EXPENSIVE AND REQUESTED A LESS EXPENSIVE ALTERNATIVE/ I REVIEWED THIS WITH DR. Luisa Hart AND HE SUGGESTED THAT MS Rouch PURCHASE ANUSOL HC/OTC/ I NOTIFIED MS Gorka/GY

## 2012-02-09 ENCOUNTER — Encounter (HOSPITAL_BASED_OUTPATIENT_CLINIC_OR_DEPARTMENT_OTHER)
Admission: RE | Admit: 2012-02-09 | Discharge: 2012-02-09 | Disposition: A | Discharge status: Home / Self Care | Payer: Self-pay | Source: Ambulatory Visit | Attending: Surgery | Admitting: Surgery

## 2012-02-09 ENCOUNTER — Encounter (HOSPITAL_BASED_OUTPATIENT_CLINIC_OR_DEPARTMENT_OTHER): Payer: Self-pay | Admitting: *Deleted

## 2012-02-09 ENCOUNTER — Ambulatory Visit (HOSPITAL_BASED_OUTPATIENT_CLINIC_OR_DEPARTMENT_OTHER)
Admission: RE | Admit: 2012-02-09 | Discharge: 2012-02-09 | Disposition: A | Discharge status: Home / Self Care | Payer: Self-pay | Source: Ambulatory Visit | Attending: Surgery | Admitting: Surgery

## 2012-02-09 DIAGNOSIS — M129 Arthropathy, unspecified: Secondary | ICD-10-CM | POA: Insufficient documentation

## 2012-02-09 DIAGNOSIS — Z800 Family history of malignant neoplasm of digestive organs: Secondary | ICD-10-CM

## 2012-02-09 DIAGNOSIS — Z8679 Personal history of other diseases of the circulatory system: Secondary | ICD-10-CM

## 2012-02-09 DIAGNOSIS — M1710 Unilateral primary osteoarthritis, unspecified knee: Secondary | ICD-10-CM

## 2012-02-09 DIAGNOSIS — J3489 Other specified disorders of nose and nasal sinuses: Secondary | ICD-10-CM | POA: Insufficient documentation

## 2012-02-09 DIAGNOSIS — D1739 Benign lipomatous neoplasm of skin and subcutaneous tissue of other sites: Secondary | ICD-10-CM | POA: Insufficient documentation

## 2012-02-09 DIAGNOSIS — J4489 Other specified chronic obstructive pulmonary disease: Secondary | ICD-10-CM | POA: Insufficient documentation

## 2012-02-09 DIAGNOSIS — L259 Unspecified contact dermatitis, unspecified cause: Secondary | ICD-10-CM

## 2012-02-09 DIAGNOSIS — I10 Essential (primary) hypertension: Secondary | ICD-10-CM | POA: Insufficient documentation

## 2012-02-09 DIAGNOSIS — J329 Chronic sinusitis, unspecified: Secondary | ICD-10-CM

## 2012-02-09 DIAGNOSIS — J441 Chronic obstructive pulmonary disease with (acute) exacerbation: Secondary | ICD-10-CM

## 2012-02-09 DIAGNOSIS — J069 Acute upper respiratory infection, unspecified: Secondary | ICD-10-CM

## 2012-02-09 DIAGNOSIS — B029 Zoster without complications: Secondary | ICD-10-CM

## 2012-02-09 DIAGNOSIS — G43909 Migraine, unspecified, not intractable, without status migrainosus: Secondary | ICD-10-CM | POA: Insufficient documentation

## 2012-02-09 DIAGNOSIS — IMO0002 Reserved for concepts with insufficient information to code with codable children: Secondary | ICD-10-CM

## 2012-02-09 DIAGNOSIS — K219 Gastro-esophageal reflux disease without esophagitis: Secondary | ICD-10-CM | POA: Insufficient documentation

## 2012-02-09 DIAGNOSIS — J449 Chronic obstructive pulmonary disease, unspecified: Secondary | ICD-10-CM

## 2012-02-09 DIAGNOSIS — K625 Hemorrhage of anus and rectum: Secondary | ICD-10-CM

## 2012-02-09 DIAGNOSIS — G43009 Migraine without aura, not intractable, without status migrainosus: Secondary | ICD-10-CM

## 2012-02-09 DIAGNOSIS — K08109 Complete loss of teeth, unspecified cause, unspecified class: Secondary | ICD-10-CM | POA: Insufficient documentation

## 2012-02-09 DIAGNOSIS — D171 Benign lipomatous neoplasm of skin and subcutaneous tissue of trunk: Secondary | ICD-10-CM

## 2012-02-09 DIAGNOSIS — R21 Rash and other nonspecific skin eruption: Secondary | ICD-10-CM

## 2012-02-09 DIAGNOSIS — E785 Hyperlipidemia, unspecified: Secondary | ICD-10-CM | POA: Insufficient documentation

## 2012-02-09 NOTE — Progress Notes (Signed)
To come in for labs,ekc,cxr per ccs

## 2012-02-10 ENCOUNTER — Ambulatory Visit
Admission: RE | Admit: 2012-02-10 | Discharge: 2012-02-10 | Disposition: A | Discharge status: Home / Self Care | Payer: No Typology Code available for payment source | Source: Ambulatory Visit | Attending: Surgery | Admitting: Surgery

## 2012-02-10 ENCOUNTER — Encounter (HOSPITAL_BASED_OUTPATIENT_CLINIC_OR_DEPARTMENT_OTHER)
Admission: RE | Admit: 2012-02-10 | Discharge: 2012-02-10 | Disposition: A | Discharge status: Home / Self Care | Payer: Self-pay | Source: Ambulatory Visit | Attending: Surgery | Admitting: Surgery

## 2012-02-10 ENCOUNTER — Other Ambulatory Visit: Payer: Self-pay

## 2012-02-10 LAB — CBC
HCT: 39.2 % (ref 36.0–46.0)
Hemoglobin: 12.8 g/dL (ref 12.0–15.0)
MCH: 27.9 pg (ref 26.0–34.0)
MCHC: 32.7 g/dL (ref 30.0–36.0)
MCV: 85.6 fL (ref 78.0–100.0)
Platelets: 249 10*3/uL (ref 150–400)
RBC: 4.58 MIL/uL (ref 3.87–5.11)
RDW: 13.6 % (ref 11.5–15.5)
WBC: 7.2 10*3/uL (ref 4.0–10.5)

## 2012-02-10 LAB — COMPREHENSIVE METABOLIC PANEL
ALT: 11 U/L (ref 0–35)
AST: 13 U/L (ref 0–37)
Albumin: 3.5 g/dL (ref 3.5–5.2)
Alkaline Phosphatase: 54 U/L (ref 39–117)
BUN: 17 mg/dL (ref 6–23)
CO2: 27 mEq/L (ref 19–32)
Calcium: 9 mg/dL (ref 8.4–10.5)
Chloride: 102 mEq/L (ref 96–112)
Creatinine, Ser: 0.63 mg/dL (ref 0.50–1.10)
GFR calc Af Amer: >90 mL/min (ref >90)
GFR calc non Af Amer: >90 mL/min (ref >90)
Glucose, Bld: 101 mg/dL — ABNORMAL HIGH (ref 70–99)
Potassium: 4.1 mEq/L (ref 3.5–5.1)
Sodium: 138 mEq/L (ref 135–145)
Total Bilirubin: 0.2 mg/dL — ABNORMAL LOW (ref 0.3–1.2)
Total Protein: 6.5 g/dL (ref 6.0–8.3)

## 2012-02-10 LAB — DIFFERENTIAL
Basophils Absolute: 0.1 10*3/uL (ref 0.0–0.1)
Basophils Relative: 1 % (ref 0–1)
Eosinophils Absolute: 0.3 10*3/uL (ref 0.0–0.7)
Eosinophils Relative: 4 % (ref 0–5)
Lymphocytes Relative: 34 % (ref 12–46)
Lymphs Abs: 2.4 10*3/uL (ref 0.7–4.0)
Monocytes Absolute: 0.5 10*3/uL (ref 0.1–1.0)
Monocytes Relative: 7 % (ref 3–12)
Neutro Abs: 3.9 10*3/uL (ref 1.7–7.7)
Neutrophils Relative %: 53 % (ref 43–77)

## 2012-02-15 ENCOUNTER — Encounter (HOSPITAL_BASED_OUTPATIENT_CLINIC_OR_DEPARTMENT_OTHER): Payer: Self-pay | Admitting: Certified Registered"

## 2012-02-15 ENCOUNTER — Encounter (HOSPITAL_BASED_OUTPATIENT_CLINIC_OR_DEPARTMENT_OTHER)
Admission: RE | Disposition: A | Discharge status: Home / Self Care | Payer: Self-pay | Source: Ambulatory Visit | Attending: Surgery

## 2012-02-15 ENCOUNTER — Ambulatory Visit (HOSPITAL_BASED_OUTPATIENT_CLINIC_OR_DEPARTMENT_OTHER): Payer: Self-pay | Admitting: Certified Registered"

## 2012-02-15 ENCOUNTER — Encounter (HOSPITAL_BASED_OUTPATIENT_CLINIC_OR_DEPARTMENT_OTHER): Payer: Self-pay | Admitting: *Deleted

## 2012-02-15 DIAGNOSIS — R51 Headache: Secondary | ICD-10-CM | POA: Insufficient documentation

## 2012-02-15 DIAGNOSIS — K08109 Complete loss of teeth, unspecified cause, unspecified class: Secondary | ICD-10-CM | POA: Insufficient documentation

## 2012-02-15 DIAGNOSIS — J4489 Other specified chronic obstructive pulmonary disease: Secondary | ICD-10-CM | POA: Insufficient documentation

## 2012-02-15 DIAGNOSIS — I10 Essential (primary) hypertension: Secondary | ICD-10-CM | POA: Insufficient documentation

## 2012-02-15 DIAGNOSIS — D1739 Benign lipomatous neoplasm of skin and subcutaneous tissue of other sites: Secondary | ICD-10-CM

## 2012-02-15 DIAGNOSIS — K219 Gastro-esophageal reflux disease without esophagitis: Secondary | ICD-10-CM | POA: Insufficient documentation

## 2012-02-15 DIAGNOSIS — J449 Chronic obstructive pulmonary disease, unspecified: Secondary | ICD-10-CM | POA: Insufficient documentation

## 2012-02-15 HISTORY — PX: LIPOMA EXCISION: SHX5283

## 2012-02-15 SURGERY — EXCISION LIPOMA
Anesthesia: General | Site: Abdomen | Laterality: Left | Wound class: Clean

## 2012-02-15 MED ORDER — MIDAZOLAM HCL 5 MG/5ML IJ SOLN
INTRAMUSCULAR | Status: DC | PRN
  Administered 2012-02-15: 2 mg via INTRAVENOUS

## 2012-02-15 MED ORDER — HYDROCODONE-ACETAMINOPHEN 5-325 MG PO TABS: 1.0000 | ORAL_TABLET | Freq: Four times a day (QID) | ORAL | Status: AC | PRN

## 2012-02-15 MED ORDER — VANCOMYCIN HCL IN DEXTROSE 1-5 GM/200ML-% IV SOLN
1000.0000 mg | INTRAVENOUS | Status: AC
  Administered 2012-02-15: 1000 mg via INTRAVENOUS

## 2012-02-15 MED ORDER — METOCLOPRAMIDE HCL 5 MG/ML IJ SOLN: 10.0000 mg | Freq: Once | INTRAMUSCULAR | Status: DC | PRN

## 2012-02-15 MED ORDER — LIDOCAINE HCL (CARDIAC) 20 MG/ML IV SOLN
INTRAVENOUS | Status: DC | PRN
  Administered 2012-02-15: 45 mg via INTRAVENOUS

## 2012-02-15 MED ORDER — ONDANSETRON HCL 4 MG/2ML IJ SOLN
INTRAMUSCULAR | Status: DC | PRN
  Administered 2012-02-15: 4 mg via INTRAVENOUS

## 2012-02-15 MED ORDER — DEXAMETHASONE SODIUM PHOSPHATE 4 MG/ML IJ SOLN
INTRAMUSCULAR | Status: DC | PRN
  Administered 2012-02-15: 4 mg via INTRAVENOUS

## 2012-02-15 MED ORDER — CHLORHEXIDINE GLUCONATE 4 % EX LIQD: 1.0000 "application " | Freq: Once | CUTANEOUS | Status: DC

## 2012-02-15 MED ORDER — LACTATED RINGERS IV SOLN
INTRAVENOUS | Status: DC
  Administered 2012-02-15: 08:00:00 via INTRAVENOUS

## 2012-02-15 MED ORDER — BUPIVACAINE-EPINEPHRINE 0.25% -1:200000 IJ SOLN
INTRAMUSCULAR | Status: DC | PRN
  Administered 2012-02-15: 10 mL

## 2012-02-15 MED ORDER — FENTANYL CITRATE 0.05 MG/ML IJ SOLN: 25.0000 ug | INTRAMUSCULAR | Status: DC | PRN

## 2012-02-15 MED ORDER — METOCLOPRAMIDE HCL 5 MG/ML IJ SOLN
INTRAMUSCULAR | Status: DC | PRN
  Administered 2012-02-15: 10 mg via INTRAVENOUS

## 2012-02-15 MED ORDER — PROPOFOL 10 MG/ML IV EMUL
INTRAVENOUS | Status: DC | PRN
  Administered 2012-02-15: 160 mg via INTRAVENOUS

## 2012-02-15 MED ORDER — FENTANYL CITRATE 0.05 MG/ML IJ SOLN
INTRAMUSCULAR | Status: DC | PRN
  Administered 2012-02-15: 100 ug via INTRAVENOUS

## 2012-02-15 SURGICAL SUPPLY — 42 items
BENZOIN TINCTURE PRP APPL 2/3 (GAUZE/BANDAGES/DRESSINGS) IMPLANT
BLADE SURG 10 STRL SS (BLADE) IMPLANT
BLADE SURG 15 STRL LF DISP TIS (BLADE) ×1 IMPLANT
BLADE SURG 15 STRL SS (BLADE) ×1
CANISTER SUCTION 1200CC (MISCELLANEOUS) IMPLANT
CHLORAPREP W/TINT 26ML (MISCELLANEOUS) ×2 IMPLANT
COVER MAYO STAND STRL (DRAPES) ×2 IMPLANT
COVER TABLE BACK 60X90 (DRAPES) ×2 IMPLANT
DECANTER SPIKE VIAL GLASS SM (MISCELLANEOUS) IMPLANT
DERMABOND ADVANCED (GAUZE/BANDAGES/DRESSINGS) ×1
DERMABOND ADVANCED .7 DNX12 (GAUZE/BANDAGES/DRESSINGS) ×1 IMPLANT
DRAPE PED LAPAROTOMY (DRAPES) ×2 IMPLANT
DRAPE UTILITY XL STRL (DRAPES) ×2 IMPLANT
ELECT COATED BLADE 2.86 ST (ELECTRODE) ×2 IMPLANT
ELECT REM PT RETURN 9FT ADLT (ELECTROSURGICAL) ×2
ELECTRODE REM PT RTRN 9FT ADLT (ELECTROSURGICAL) ×1 IMPLANT
GLOVE BIOGEL PI IND STRL 7.0 (GLOVE) ×1 IMPLANT
GLOVE BIOGEL PI IND STRL 8 (GLOVE) ×1 IMPLANT
GLOVE BIOGEL PI INDICATOR 7.0 (GLOVE) ×1
GLOVE BIOGEL PI INDICATOR 8 (GLOVE) ×1
GLOVE ECLIPSE 6.5 STRL STRAW (GLOVE) ×2 IMPLANT
GLOVE ECLIPSE 8.0 STRL XLNG CF (GLOVE) ×2 IMPLANT
GLOVE SURG SS PI 7.5 STRL IVOR (GLOVE) ×2 IMPLANT
GOWN PREVENTION PLUS XLARGE (GOWN DISPOSABLE) ×4 IMPLANT
GOWN PREVENTION PLUS XXLARGE (GOWN DISPOSABLE) ×2 IMPLANT
NEEDLE HYPO 25X1 1.5 SAFETY (NEEDLE) ×2 IMPLANT
NS IRRIG 1000ML POUR BTL (IV SOLUTION) IMPLANT
PACK BASIN DAY SURGERY FS (CUSTOM PROCEDURE TRAY) ×2 IMPLANT
PENCIL BUTTON HOLSTER BLD 10FT (ELECTRODE) ×2 IMPLANT
SPONGE LAP 4X18 X RAY DECT (DISPOSABLE) IMPLANT
STAPLER VISISTAT 35W (STAPLE) IMPLANT
STRIP CLOSURE SKIN 1/2X4 (GAUZE/BANDAGES/DRESSINGS) IMPLANT
SUT MON AB 4-0 PC3 18 (SUTURE) ×2 IMPLANT
SUT VIC AB 3-0 SH 27 (SUTURE) ×1
SUT VIC AB 3-0 SH 27X BRD (SUTURE) ×1 IMPLANT
SUT VICRYL AB 3 0 TIES (SUTURE) IMPLANT
SYR CONTROL 10ML LL (SYRINGE) ×2 IMPLANT
TOWEL OR 17X24 6PK STRL BLUE (TOWEL DISPOSABLE) ×2 IMPLANT
TOWEL OR NON WOVEN STRL DISP B (DISPOSABLE) ×2 IMPLANT
TUBE CONNECTING 20X1/4 (TUBING) IMPLANT
WATER STERILE IRR 1000ML POUR (IV SOLUTION) IMPLANT
YANKAUER SUCT BULB TIP NO VENT (SUCTIONS) IMPLANT

## 2012-02-15 NOTE — Anesthesia Preprocedure Evaluation (Addendum)
Anesthesia Evaluation  Patient identified by MRN, date of birth, ID band Patient awake    Reviewed: Allergy & Precautions, H&P , NPO status , Patient's Chart, lab work & pertinent test results, reviewed documented beta blocker date and time   Airway Mallampati: II TM Distance: >3 FB Neck ROM: full    Dental  (+) Edentulous Upper and Edentulous Lower   Pulmonary shortness of breath, asthma , COPD COPD inhaler,          Cardiovascular hypertension, On Medications and On Home Beta Blockers     Neuro/Psych  Headaches, Negative Psych ROS   GI/Hepatic Neg liver ROS, GERD-  Medicated,  Endo/Other  Negative Endocrine ROS  Renal/GU negative Renal ROS  Genitourinary negative   Musculoskeletal   Abdominal   Peds  Hematology negative hematology ROS (+)   Anesthesia Other Findings See surgeon's H&P   Reproductive/Obstetrics negative OB ROS                         Anesthesia Physical Anesthesia Plan  ASA: III  Anesthesia Plan: General   Post-op Pain Management:    Induction: Intravenous  Airway Management Planned: LMA  Additional Equipment:   Intra-op Plan:   Post-operative Plan: Extubation in OR  Informed Consent: I have reviewed the patients History and Physical, chart, labs and discussed the procedure including the risks, benefits and alternatives for the proposed anesthesia with the patient or authorized representative who has indicated his/her understanding and acceptance.     Plan Discussed with: CRNA and Surgeon  Anesthesia Plan Comments:         Anesthesia Quick Evaluation

## 2012-02-15 NOTE — Transfer of Care (Signed)
Immediate Anesthesia Transfer of Care Note  Patient: Summer Jackson  Procedure(s) Performed: Procedure(s) (LRB): EXCISION LIPOMA (Left)  Patient Location: PACU  Anesthesia Type: General  Level of Consciousness: awake, alert , oriented and patient cooperative  Airway & Oxygen Therapy: Patient Spontanous Breathing and Patient connected to face mask oxygen  Post-op Assessment: Report given to PACU RN and Post -op Vital signs reviewed and stable  Post vital signs: Reviewed and stable  Complications: No apparent anesthesia complications

## 2012-02-15 NOTE — Interval H&P Note (Signed)
History and Physical Interval Note:  02/15/2012 8:07 AM  Summer Jackson  has presented today for surgery, with the diagnosis of lipoma abdomen  The various methods of treatment have been discussed with the patient and family. After consideration of risks, benefits and other options for treatment, the patient has consented to  Procedure(s) (LRB): EXCISION LIPOMA (N/A) as a surgical intervention .  The patients' history has been reviewed, patient examined, no change in status, stable for surgery.  I have reviewed the patients' chart and labs.  Questions were answered to the patient's satisfaction.     Martinez Boxx A.

## 2012-02-15 NOTE — Op Note (Signed)
Preop diagnosis: Lipoma abdomen measuring 3 cm x 5 cm  Postop diagnosis: Same  Procedure: Excision of lower subcutaneous abdominal lipoma measuring 3 cm x 5 cm  Surgeon: Harriette Bouillon M.D.  Anesthesia: Gen. with 0.25% Sensorcaine with epinephrine local  Specimen: Lipoma  Drains: None  EBL: Less than 50 cc  Indications for procedure: The patient presents with a painful lipoma located in the subcutaneous fat overlying her left lower abdominal wall. We discussed treatment options of observation versus excision. Risks, benefits and alternative therapies were discussed. She was to proceed with excision of lipoma.  Description of procedure: The patient was seen in the holding area in the lipoma was marked. Questions are answered. She was taken back to the operating room where she was placed supine upon the operating room table. General anesthesia was initiated. The abdomen was prepped and draped in a sterile fashion and she received 1 g of vancomycin. Timeout was done. The lipoma was palpated and the area was injected with 0.25% Sensorcaine with epinephrine. Incision was made over lipoma and this came out easily from the subcutaneous fat. The cavity was irrigated and closed with a deep layer of 3-0 Vicryl and 4-0 Monocryl was used to close the skin in a subcuticular fashion. All final counts sponge, needles and instruments are found to be correct. The patient was in awoke and taken to recovery in satisfactory condition. Dermabond applied as dressing.

## 2012-02-15 NOTE — Anesthesia Postprocedure Evaluation (Signed)
Anesthesia Post Note  Patient: Summer Jackson  Procedure(s) Performed: Procedure(s) (LRB): EXCISION LIPOMA (Left)  Anesthesia type: General  Patient location: PACU  Post pain: Pain level controlled  Post assessment: Patient's Cardiovascular Status Stable  Last Vitals:  Filed Vitals:   02/15/12 1030  BP: 132/80  Pulse: 74  Temp: 36.1 C  Resp: 18    Post vital signs: Reviewed and stable  Level of consciousness: alert  Complications: No apparent anesthesia complications

## 2012-02-15 NOTE — Progress Notes (Signed)
Pt put upper and lower dentures in and glasses on by pt

## 2012-02-15 NOTE — Anesthesia Procedure Notes (Signed)
Procedure Name: LMA Insertion Date/Time: 02/15/2012 8:33 AM Performed by: Radford Pax Pre-anesthesia Checklist: Patient identified, Emergency Drugs available, Suction available, Patient being monitored and Timeout performed Patient Re-evaluated:Patient Re-evaluated prior to inductionOxygen Delivery Method: Circle System Utilized Preoxygenation: Pre-oxygenation with 100% oxygen Intubation Type: IV induction Ventilation: Mask ventilation without difficulty LMA: LMA with gastric port inserted LMA Size: 4.0 Number of attempts: 1 (atraumatic) Placement Confirmation: positive ETCO2 Tube secured with: Tape Dental Injury: Teeth and Oropharynx as per pre-operative assessment  Comments: Patient is edentulous, and upper and lower dentures are out.

## 2012-02-15 NOTE — H&P (View-Only) (Signed)
Patient ID: Summer Jackson, female   DOB: 1946/05/28, 66 y.o.   MRN: [id number]  Chief Complaint  Patient presents with  . Pre-op Exam    rectal bleeding    HPI Summer Jackson is a 66 y.o. female.   HPI The patient is sent at the request of Dr.Devani due to mass located on her anterior abdominal wall. This measures 3 cm. It is tender to palpation. She also has a complaint of rectal bleeding. She underwent colonoscopy last month by Dr. Mauri Pole which showed diverticulosis and internal hemorrhoids. She's having more pain over the mass overlying her abdominal wall especially with palpation. She also has no rectal bleeding which is increased over the last 4 months. No burning or itching.  Past Medical History  Diagnosis Date  . Hypertension   . Hyperlipemia   . Headache     migraine  . GERD (gastroesophageal reflux disease)   . Rectal bleeding   . Arthritis   . Blood transfusion   . COPD (chronic obstructive pulmonary disease)   . Weight loss   . Nasal congestion   . Wheezing     Past Surgical History  Procedure Date  . Heart surgery at 13yr   . Appendectomy   . Colonoscopy   . Left ovary and fallopian tube removed   . Colonoscopy 12/22/2011    Procedure: COLONOSCOPY;  Surgeon: Shirley Friar, MD;  Location: [facility];  Service: Endoscopy;  Laterality: N/A;  . Ovarian tumor   . Breast tumor     left    Family History  Problem Relation Age of Onset  . Colon cancer Father   . Cancer Father     colon  . Malignant hyperthermia Neg Hx     Social History History  Substance Use Topics  . Smoking status: Former Smoker    Quit date: 08/23/2011  . Smokeless tobacco: Not on file  . Alcohol Use: No    Allergies  Allergen Reactions  . Amoxicillin     REACTION: headache  . Azithromycin     REACTION: reddness around mouth  . Clarithromycin     REACTION: skin rash with minimal vesicular formation on arms and hands  . Penicillins     REACTION: headache  .  Propoxyphene N-Acetaminophen     REACTION: mouth sores    Current Outpatient Prescriptions  Medication Sig Dispense Refill  . esomeprazole (NEXIUM) 40 MG capsule Take 40 mg by mouth daily before breakfast. DELAYED RELEASE      . fexofenadine-pseudoephedrine (ALLEGRA-D 24) 180-240 MG per 24 hr tablet Take 1 tablet by mouth daily.        . metoprolol (TOPROL-XL) 50 MG 24 hr tablet Take 50 mg by mouth daily.        . pravastatin (PRAVACHOL) 20 MG tablet Take 20 mg by mouth daily.      . RABEprazole (ACIPHEX) 20 MG tablet Take 20 mg by mouth daily.        . simvastatin (ZOCOR) 10 MG tablet Take 10 mg by mouth at bedtime. No dosage listed, in evening       . topiramate (TOPAMAX) 50 MG tablet Take 50 mg by mouth 2 (two) times daily.        . hydrocortisone-pramoxine (ANALPRAM-HC) 2.5-1 % rectal cream Place rectally 3 (three) times daily.  30 g  0    Review of Systems Review of Systems  Constitutional: Negative for fever, chills and unexpected weight change.  HENT: Negative for hearing loss, congestion,  sore throat, trouble swallowing and voice change.   Eyes: Negative for visual disturbance.  Respiratory: Negative for cough and wheezing.   Cardiovascular: Negative for chest pain, palpitations and leg swelling.  Gastrointestinal: Negative for nausea, vomiting, abdominal pain, diarrhea, constipation, blood in stool, abdominal distention and anal bleeding.  Genitourinary: Negative for hematuria, vaginal bleeding and difficulty urinating.  Musculoskeletal: Negative for arthralgias.  Skin: Negative for rash and wound.  Neurological: Negative for seizures, syncope and headaches.  Hematological: Negative for adenopathy. Does not bruise/bleed easily.  Psychiatric/Behavioral: Negative for confusion.    Blood pressure 130/82, pulse 83, temperature 98.6 F (37 C), temperature source Temporal, height 5' 4.5" (1.638 m), weight 180 lb 3.2 oz (81.738 kg), SpO2 97.00%.  Physical Exam Physical Exam    Constitutional: She is oriented to person, place, and time. She appears well-developed and well-nourished.  HENT:  Head: Normocephalic and atraumatic.  Eyes: EOM are normal. Pupils are equal, round, and reactive to light.  Neck: Normal range of motion. Neck supple.  Cardiovascular: Normal rate and regular rhythm.   Pulmonary/Chest: Effort normal. She has wheezes.  Abdominal: Soft.    Neurological: She is alert and oriented to person, place, and time. GCS eye subscore is 4. GCS verbal subscore is 5. GCS motor subscore is 6.  Skin: Skin is warm, dry and intact.  Psychiatric: Her speech is normal and behavior is normal. Judgment and thought content normal. Her mood appears anxious. Cognition and memory are normal.   Anoscopy was performed. 3 column internal hemorrhoid disease noted grade 2.   Data Reviewed Colonoscopy internal hemorrhoids sigmoid diverticulosis   Assessment    Lipoma abdominal wall 3 cm Internal hemorrhoids  Grade 2    Plan    Excision of symptomatic abdominal wall lipoma measuring 3 cm.The procedure has been discussed with the patient.  Alternative therapies have been discussed with the patient.  Operative risks include bleeding,  Infection,  Organ injury,  Nerve injury,  Blood vessel injury,  DVT,  Pulmonary embolism,  Death,  And possible reoperation.  Medical management risks include worsening of present situation.  The success of the procedure is 50 -90 % at treating patients symptoms.  The patient understands and agrees to proceed.  Medical management of internal hemorrhoid disease for now until after lipoma removed. Analpram prescribed. High fiber diet recommended.         Sarag[place] Selke A. 01/23/2012, 3:33 PM

## 2012-02-15 NOTE — Discharge Instructions (Signed)
Shower Thursday.  Follow up 3 -4 weeks.  Call office for appt.  No restrictions.  Ice pack as needed.   [facility]  [address] [place], [place] [address] [phone]   Post Anesthesia Home Care Instructions  Activity: Get plenty of rest for the remainder of the day. A responsible adult should stay with you for 24 hours following the procedure.  For the next 24 hours, DO NOT: -Drive a car -Advertising copywriter -Drink alcoholic beverages -Take any medication unless instructed by your physician -Make any legal decisions or sign important papers.  Meals: Start with liquid foods such as gelatin or soup. Progress to regular foods as tolerated. Avoid greasy, spicy, heavy foods. If nausea and/or vomiting occur, drink only clear liquids until the nausea and/or vomiting subsides. Call your physician if vomiting continues.  Special Instructions/Symptoms: Your throat may feel dry or sore from the anesthesia or the breathing tube placed in your throat during surgery. If this causes discomfort, gargle with warm salt water. The discomfort should disappear within 24 hours.

## 2012-02-16 ENCOUNTER — Encounter (HOSPITAL_BASED_OUTPATIENT_CLINIC_OR_DEPARTMENT_OTHER): Payer: Self-pay | Admitting: Surgery

## 2012-02-17 ENCOUNTER — Telehealth (INDEPENDENT_AMBULATORY_CARE_PROVIDER_SITE_OTHER): Payer: Self-pay | Admitting: Surgery

## 2012-02-17 ENCOUNTER — Encounter (INDEPENDENT_AMBULATORY_CARE_PROVIDER_SITE_OTHER): Payer: Self-pay | Admitting: General Surgery

## 2012-02-17 ENCOUNTER — Ambulatory Visit (INDEPENDENT_AMBULATORY_CARE_PROVIDER_SITE_OTHER): Payer: PRIVATE HEALTH INSURANCE | Admitting: General Surgery

## 2012-02-17 DIAGNOSIS — D1739 Benign lipomatous neoplasm of skin and subcutaneous tissue of other sites: Secondary | ICD-10-CM

## 2012-02-17 DIAGNOSIS — D171 Benign lipomatous neoplasm of skin and subcutaneous tissue of trunk: Secondary | ICD-10-CM

## 2012-02-17 MED ORDER — DOXYCYCLINE HYCLATE 100 MG PO TABS: 100.0000 mg | ORAL_TABLET | Freq: Two times a day (BID) | ORAL | Status: AC

## 2012-02-17 NOTE — Progress Notes (Signed)
Subjective:     Patient ID: Summer Jackson, female   DOB: November 29, 1946, 66 y.o.   MRN: [id number]  HPI The patient is a 66 year old white female who is 2 days status post excision of a lipoma from her left lower quadrant abdominal wall. She has developed some discoloration in the skin and was concerned about infection. She denies any fevers or chills.  Review of Systems     Objective:   Physical Exam On exam her incision appears to be healing nicely. She has some bruising in the soft tissue around the incision. There is very little if any cellulitis associated with it.    Assessment:     2 days status post excision of a lipoma with some soft tissue bruising    Plan:     We will put her on doxycycline and just in case she has some mild cellulitis brewing but other wise I think this will heal just fine. We will have her follow up next week with Dr. Luisa Hart for a wound check

## 2012-02-17 NOTE — Patient Instructions (Signed)
Start doxycycline twice a day

## 2012-02-20 ENCOUNTER — Encounter (INDEPENDENT_AMBULATORY_CARE_PROVIDER_SITE_OTHER): Payer: PRIVATE HEALTH INSURANCE | Admitting: Surgery

## 2012-02-20 ENCOUNTER — Telehealth (INDEPENDENT_AMBULATORY_CARE_PROVIDER_SITE_OTHER): Payer: Self-pay | Admitting: General Surgery

## 2012-02-20 NOTE — Telephone Encounter (Signed)
MS Kozinski CALLED TO SAY THE DOXYCYCLINE RX WAS TOO EXPENSIVE FOR HER AND IS REQUESTING THAT DR. TOTH ORDER AN ANTIBIOTIC THAT IS LESS EXPENSIVE. SHE STATES THAT HER INCISION AREA LOOKS ABOUT SAME AS Friday/ NO FEVER OR DRAINAGE FROM INCISION/GY. I WILL CHECK WITH DR. TOTH/GY

## 2012-02-20 NOTE — Telephone Encounter (Signed)
I REVIEWED ANTIBIOTIC ORDER WITH DR. TOTH AND HE INDICATED THAT NO OTHER ANTIBIOTIC NEEDED TO BE CALLED IN IF HER AREA OF REDNESS HAS NOT PROGRESSED SINCE Friday. I INSTRUCTED PT TO MARK EDGE OF RED AREA WITH PEN AND MONITOR EDGES  TO SEE IF RED AREA WORSENS. SHE WILL CONTACT OFFICE IF A CHANGE IS NOTED BEFORE HER FOLLOW-UP APPT WITH DR. CORNETT./GY /PT ADVISED.

## 2012-03-09 ENCOUNTER — Ambulatory Visit (INDEPENDENT_AMBULATORY_CARE_PROVIDER_SITE_OTHER): Payer: PRIVATE HEALTH INSURANCE | Admitting: Surgery

## 2012-03-09 ENCOUNTER — Encounter (INDEPENDENT_AMBULATORY_CARE_PROVIDER_SITE_OTHER): Payer: Self-pay | Admitting: Surgery

## 2012-03-09 VITALS — BP 154/102 | HR 72 | Temp 97.2°F | Resp 18 | Ht 63.0 in | Wt 182.0 lb

## 2012-03-09 DIAGNOSIS — Z9889 Other specified postprocedural states: Secondary | ICD-10-CM

## 2012-03-09 MED ORDER — HYDROCORTISONE ACE-PRAMOXINE 2.5-1 % RE CREA: TOPICAL_CREAM | Freq: Three times a day (TID) | RECTAL | Status: AC

## 2012-03-09 NOTE — Progress Notes (Signed)
Patient returns after excision of lower abdominal wall lipoma. She is doing okay. Her hemorrhoids are bothering her.   Exam: Incision clean dry intact the lower abdomen pathology shows lipoma.  Impression: Status post excision lipoma  Plan: Followup as needed. Resume regular activity. I gave her a prescription for Analpram since she did not feel her initial prescription due to cost. She will take this to help serve to see if they have anything comparable.

## 2012-03-09 NOTE — Patient Instructions (Signed)
Follow up as needed.  Analpram for hemorrhoids.

## 2012-04-09 ENCOUNTER — Encounter (HOSPITAL_COMMUNITY): Payer: Self-pay | Admitting: *Deleted

## 2012-04-09 ENCOUNTER — Emergency Department (HOSPITAL_COMMUNITY)
Admission: EM | Admit: 2012-04-09 | Discharge: 2012-04-09 | Disposition: A | Discharge status: Home / Self Care | Payer: Self-pay | Attending: Emergency Medicine | Admitting: Emergency Medicine

## 2012-04-09 DIAGNOSIS — Z800 Family history of malignant neoplasm of digestive organs: Secondary | ICD-10-CM | POA: Insufficient documentation

## 2012-04-09 DIAGNOSIS — J449 Chronic obstructive pulmonary disease, unspecified: Secondary | ICD-10-CM | POA: Insufficient documentation

## 2012-04-09 DIAGNOSIS — Z880 Allergy status to penicillin: Secondary | ICD-10-CM | POA: Insufficient documentation

## 2012-04-09 DIAGNOSIS — J4489 Other specified chronic obstructive pulmonary disease: Secondary | ICD-10-CM | POA: Insufficient documentation

## 2012-04-09 DIAGNOSIS — Z9089 Acquired absence of other organs: Secondary | ICD-10-CM | POA: Insufficient documentation

## 2012-04-09 DIAGNOSIS — I10 Essential (primary) hypertension: Secondary | ICD-10-CM | POA: Insufficient documentation

## 2012-04-09 DIAGNOSIS — Z881 Allergy status to other antibiotic agents status: Secondary | ICD-10-CM | POA: Insufficient documentation

## 2012-04-09 DIAGNOSIS — M1710 Unilateral primary osteoarthritis, unspecified knee: Secondary | ICD-10-CM | POA: Insufficient documentation

## 2012-04-09 DIAGNOSIS — Z8249 Family history of ischemic heart disease and other diseases of the circulatory system: Secondary | ICD-10-CM | POA: Insufficient documentation

## 2012-04-09 DIAGNOSIS — K219 Gastro-esophageal reflux disease without esophagitis: Secondary | ICD-10-CM | POA: Insufficient documentation

## 2012-04-09 DIAGNOSIS — F17200 Nicotine dependence, unspecified, uncomplicated: Secondary | ICD-10-CM | POA: Insufficient documentation

## 2012-04-09 DIAGNOSIS — E785 Hyperlipidemia, unspecified: Secondary | ICD-10-CM | POA: Insufficient documentation

## 2012-04-09 MED ORDER — HYDROCODONE-ACETAMINOPHEN 5-325 MG PO TABS
1.0000 | ORAL_TABLET | Freq: Once | ORAL | Status: AC
  Administered 2012-04-09: 1 via ORAL
  Filled 2012-04-09: qty 1

## 2012-04-09 MED ORDER — SULFAMETHOXAZOLE-TRIMETHOPRIM 800-160 MG PO TABS: 1.0000 | ORAL_TABLET | Freq: Two times a day (BID) | ORAL | Status: DC

## 2012-04-09 MED ORDER — HYDROCODONE-ACETAMINOPHEN 5-325 MG PO TABS: 1.0000 | ORAL_TABLET | ORAL | Status: AC | PRN

## 2012-04-09 MED ORDER — LIDOCAINE HCL 2 % IJ SOLN
INTRAMUSCULAR | Status: AC
  Filled 2012-04-09: qty 1

## 2012-04-09 NOTE — ED Provider Notes (Signed)
History     CSN: [id number]  Arrival date & time 04/09/12  1736   First MD Initiated Contact with Patient 04/09/12 1846      Chief Complaint  Patient presents with  . Knee Pain    (Consider location/radiation/quality/duration/timing/severity/associated sxs/prior treatment) Patient is a 66 y.o. female presenting with knee pain. The history is provided by the patient.  Knee Pain This is a new problem. The current episode started today. The problem occurs constantly. The problem has been gradually worsening. Associated symptoms include joint swelling. Pertinent negatives include no fever, myalgias or nausea. Associated symptoms comments: She woke this morning with bilateral knee pain and swelling, worse on the left. She also reports redness to left knee. No known injury. She denies symptoms of swelling and pain before, but also reports a history of arthritis and previous arthroscopy of the left knee. .    Past Medical History  Diagnosis Date  . Hypertension   . Hyperlipemia   . Headache     migraine  . GERD (gastroesophageal reflux disease)   . Rectal bleeding   . Arthritis   . Blood transfusion   . COPD (chronic obstructive pulmonary disease)   . Weight loss   . Nasal congestion   . Wheezing   . Shortness of breath   . Asthma   . Lipoma     on abd  . Hemorrhoids     Past Surgical History  Procedure Date  . Heart surgery at 40yr     age 50 -flow throught heart not normal-per pt  . Appendectomy   . Colonoscopy   . Left ovary and fallopian tube removed   . Colonoscopy 12/22/2011    Procedure: COLONOSCOPY;  Surgeon: Shirley Friar, MD;  Location: [facility];  Service: Endoscopy;  Laterality: N/A;  . Ovarian tumor   . Breast tumor     left  . Knee arthroscopy     left x2  . Lipoma excision 02/15/2012    Procedure: EXCISION LIPOMA;  Surgeon: Clovis Pu. Cornett, MD;  Location: [facility];  Service: General;  Laterality: Left;  excision lipoma Left Lower  abdomen    Family History  Problem Relation Age of Onset  . Colon cancer Father   . Cancer Father     colon  . Heart disease Father   . Malignant hyperthermia Neg Hx   . Heart disease Paternal Uncle     History  Substance Use Topics  . Smoking status: Former Smoker    Quit date: 08/23/2011  . Smokeless tobacco: Not on file  . Alcohol Use: No    OB History    Grav Para Term Preterm Abortions TAB SAB Ect Mult Living                  Review of Systems  Constitutional: Negative for fever.  Gastrointestinal: Negative for nausea.  Musculoskeletal: Positive for joint swelling. Negative for myalgias.  Skin: Positive for color change.    Allergies  Amoxicillin; Azithromycin; Clarithromycin; Penicillins; and Propoxacet-n  Home Medications   Current Outpatient Rx  Name Route Sig Dispense Refill  . ALBUTEROL SULFATE HFA 108 (90 BASE) MCG/ACT IN AERS Inhalation Inhale 2 puffs into the lungs every 6 (six) hours as needed. Shortness of breath    . ESOMEPRAZOLE MAGNESIUM 40 MG PO CPDR Oral Take 40 mg by mouth daily before breakfast. DELAYED RELEASE    . FEXOFENADINE-PSEUDOEPHED ER 180-240 MG PO TB24 Oral Take 1 tablet by mouth  daily.      Marland Kitchen METOPROLOL SUCCINATE ER 50 MG PO TB24 Oral Take 50 mg by mouth daily.      Marland Kitchen PRAVASTATIN SODIUM 20 MG PO TABS Oral Take 20 mg by mouth daily.    Marland Kitchen RABEPRAZOLE SODIUM 20 MG PO TBEC Oral Take 20 mg by mouth daily.      Marland Kitchen SIMVASTATIN 10 MG PO TABS Oral Take 10 mg by mouth at bedtime. No dosage listed, in evening       BP 164/80  Pulse 75  Temp 97.8 F (36.6 C)  Resp 16  SpO2 100%  Physical Exam  Constitutional: She is oriented to person, place, and time. She appears well-developed and well-nourished.  Neck: Normal range of motion.  Pulmonary/Chest: Effort normal.  Musculoskeletal:       Knees swollen bilaterally with redness anteriorly to left knee and moderate warmth to touch. FROM, fully weight bearing. Ambulatory without difficulty.  No calf or thigh tenderness.   Neurological: She is alert and oriented to person, place, and time.  Skin: Skin is warm and dry.    ED Course  Procedures (including critical care time)  Labs Reviewed - No data to display No results found.   No diagnosis found. 1. Arthritis - knee    MDM  Arthrocentesis attempted by Dr. Rosalia Hammers without being able to obtain fluid. Will discharge home on Septra DS to cover empircally for infection, however, have low suspicion for septic joint based on presentation, subjective improvement in redness since this a.m. And no fever or insult to knee. Pain medications given. Refer to her ortho for recheck.         Rodena Medin, PA-C 04/09/12 2204

## 2012-04-09 NOTE — ED Notes (Signed)
Pt reports history of knee pain and arthritis. Pt reports pain and swelling worse to left knee. Pt woke up this AM with increased pain to left knee. Pt reports her PCP told her years ago she needed a knee replacement and pt has not complied.  Pt denies taking medication for knee pain and reports it is painful to walk.

## 2012-04-09 NOTE — ED Notes (Signed)
Patient reports that she has a history of bilateral  knee pain. Patient reports that she woke this AM with bilataral knee swelling and redness. Patient c/o increased difficulty walking.

## 2012-04-09 NOTE — ED Notes (Signed)
Pt signed informed consent for knee arthrocentesis on left side. Pt aware of risks and benefits of this procedure.

## 2012-04-09 NOTE — Discharge Instructions (Signed)
FOLLOW UP WITH YOUR ORTHOPEDIST OR WITH DR. CHANDLER FOR RECHECK AND ANY FURTHER EVALUATION OUTPATIENT. TAKE MEDICATIONS AS PRESCRIBED.   Arthritis, Nonspecific Arthritis is pain, redness, warmth, or puffiness (swelling) of a joint. The joint may be stiff or hurt when you move it. One or more joints may be affected. There are many types of arthritis. Your doctor may not know what type you have right away. The most common cause of arthritis is wear and tear on the joint (osteoarthritis). HOME CARE   Only take medicine as told by your doctor.   Rest the joint as much as possible.   Raise (elevate) your joint if it is puffy.   Use crutches if the painful joint is in your leg.   Drink enough water and fluids to keep your pee (urine) clear or pale yellow.   Follow your doctor's instructions for diet.   Use cold packs for very bad joint pain for 10 to 15 minutes every hour. Ask your doctor if it is okay for you to use hot packs.   Exercise as told by your doctor.   Take a warm shower if you have stiffness in the morning.   Move your sore joints throughout the day.  GET HELP RIGHT AWAY IF:   You do not feel better in 24 hours or are getting worse.   You are having side effects from your medicine.   You are not getting better with treatment.   You have a fever.   You have very bad joint pain, puffiness, or redness.   Many joints become painful and puffy.   You have very bad back pain or leg weakness.   You cannot control when you poop (bowel movement) or pee (urinate).  MAKE SURE YOU:   Understand these instructions.   Will watch your condition.   Will get help right away if you are not doing well or get worse.  Document Released: 03/01/2010 Document Revised: 11/24/2011 Document Reviewed: 03/01/2010 [facility] Patient Information 2012 [place], [place].

## 2012-04-11 ENCOUNTER — Emergency Department (HOSPITAL_COMMUNITY)
Admission: EM | Admit: 2012-04-11 | Discharge: 2012-04-11 | Disposition: A | Discharge status: Home / Self Care | Payer: Self-pay | Attending: Emergency Medicine | Admitting: Emergency Medicine

## 2012-04-11 ENCOUNTER — Encounter (HOSPITAL_COMMUNITY): Payer: Self-pay | Admitting: *Deleted

## 2012-04-11 DIAGNOSIS — R21 Rash and other nonspecific skin eruption: Secondary | ICD-10-CM | POA: Insufficient documentation

## 2012-04-11 DIAGNOSIS — I10 Essential (primary) hypertension: Secondary | ICD-10-CM | POA: Insufficient documentation

## 2012-04-11 DIAGNOSIS — J449 Chronic obstructive pulmonary disease, unspecified: Secondary | ICD-10-CM | POA: Insufficient documentation

## 2012-04-11 DIAGNOSIS — Z79899 Other long term (current) drug therapy: Secondary | ICD-10-CM | POA: Insufficient documentation

## 2012-04-11 DIAGNOSIS — T7840XA Allergy, unspecified, initial encounter: Secondary | ICD-10-CM

## 2012-04-11 DIAGNOSIS — T370X5A Adverse effect of sulfonamides, initial encounter: Secondary | ICD-10-CM | POA: Insufficient documentation

## 2012-04-11 DIAGNOSIS — J4489 Other specified chronic obstructive pulmonary disease: Secondary | ICD-10-CM | POA: Insufficient documentation

## 2012-04-11 DIAGNOSIS — K219 Gastro-esophageal reflux disease without esophagitis: Secondary | ICD-10-CM | POA: Insufficient documentation

## 2012-04-11 DIAGNOSIS — R220 Localized swelling, mass and lump, head: Secondary | ICD-10-CM | POA: Insufficient documentation

## 2012-04-11 DIAGNOSIS — E785 Hyperlipidemia, unspecified: Secondary | ICD-10-CM | POA: Insufficient documentation

## 2012-04-11 LAB — COMPREHENSIVE METABOLIC PANEL
ALT: 20 U/L (ref 0–35)
AST: 20 U/L (ref 0–37)
Albumin: 3.8 g/dL (ref 3.5–5.2)
Alkaline Phosphatase: 67 U/L (ref 39–117)
BUN: 13 mg/dL (ref 6–23)
CO2: 23 mEq/L (ref 19–32)
Calcium: 8.8 mg/dL (ref 8.4–10.5)
Chloride: 107 mEq/L (ref 96–112)
Creatinine, Ser: 0.67 mg/dL (ref 0.50–1.10)
GFR calc Af Amer: >90 mL/min (ref >90)
GFR calc non Af Amer: >90 mL/min (ref >90)
Glucose, Bld: 107 mg/dL — ABNORMAL HIGH (ref 70–99)
Potassium: 4.1 mEq/L (ref 3.5–5.1)
Sodium: 140 mEq/L (ref 135–145)
Total Bilirubin: 0.2 mg/dL — ABNORMAL LOW (ref 0.3–1.2)
Total Protein: 6.7 g/dL (ref 6.0–8.3)

## 2012-04-11 LAB — CBC
HCT: 38.3 % (ref 36.0–46.0)
Hemoglobin: 12.7 g/dL (ref 12.0–15.0)
MCH: 28 pg (ref 26.0–34.0)
MCHC: 33.2 g/dL (ref 30.0–36.0)
MCV: 84.4 fL (ref 78.0–100.0)
Platelets: 272 10*3/uL (ref 150–400)
RBC: 4.54 MIL/uL (ref 3.87–5.11)
RDW: 13.7 % (ref 11.5–15.5)
WBC: 8.9 10*3/uL (ref 4.0–10.5)

## 2012-04-11 LAB — DIFFERENTIAL
Basophils Absolute: 0 10*3/uL (ref 0.0–0.1)
Basophils Relative: 0 % (ref 0–1)
Eosinophils Absolute: 0.3 10*3/uL (ref 0.0–0.7)
Eosinophils Relative: 3 % (ref 0–5)
Lymphocytes Relative: 28 % (ref 12–46)
Lymphs Abs: 2.5 10*3/uL (ref 0.7–4.0)
Monocytes Absolute: 0.5 10*3/uL (ref 0.1–1.0)
Monocytes Relative: 6 % (ref 3–12)
Neutro Abs: 5.5 10*3/uL (ref 1.7–7.7)
Neutrophils Relative %: 62 % (ref 43–77)

## 2012-04-11 MED ORDER — CIPROFLOXACIN HCL 500 MG PO TABS: 500.0000 mg | ORAL_TABLET | Freq: Two times a day (BID) | ORAL | Status: AC

## 2012-04-11 MED ORDER — METHYLPREDNISOLONE SODIUM SUCC 125 MG IJ SOLR
125.0000 mg | Freq: Once | INTRAMUSCULAR | Status: AC
  Administered 2012-04-11: 125 mg via INTRAVENOUS
  Filled 2012-04-11: qty 2

## 2012-04-11 MED ORDER — PREDNISONE 10 MG PO TABS: 20.0000 mg | ORAL_TABLET | Freq: Every day | ORAL | Status: DC

## 2012-04-11 MED ORDER — FAMOTIDINE IN NACL 20-0.9 MG/50ML-% IV SOLN
20.0000 mg | Freq: Once | INTRAVENOUS | Status: AC
  Administered 2012-04-11: 20 mg via INTRAVENOUS
  Filled 2012-04-11: qty 50

## 2012-04-11 MED ORDER — FAMOTIDINE 20 MG PO TABS: 20.0000 mg | ORAL_TABLET | Freq: Two times a day (BID) | ORAL | Status: DC

## 2012-04-11 MED ORDER — SODIUM CHLORIDE 0.9 % IV SOLN
Freq: Once | INTRAVENOUS | Status: AC
  Administered 2012-04-11: 14:00:00 via INTRAVENOUS

## 2012-04-11 MED ORDER — DIPHENHYDRAMINE HCL 50 MG/ML IJ SOLN
50.0000 mg | Freq: Once | INTRAMUSCULAR | Status: AC
  Administered 2012-04-11: 50 mg via INTRAVENOUS
  Filled 2012-04-11: qty 1

## 2012-04-11 NOTE — Discharge Instructions (Signed)
Return here tomorrow for a recheck. Take the ciprofloxacin for your urinary tract infection. Return here immediately if the rash becomes worse or if he started to have blisters in your mouth Allergic Reaction Allergic reactions can be caused by anything your body is sensitive to. Your body may be sensitive to food, medicines, molds, pollens, cockroaches, dust mites, pets, insect stings, and other things around you. An allergic reaction may cause puffiness (swelling), itching, sneezing, coughing, or problems breathing.  Allergies cannot be cured, but they can be controlled with medicine. Some allergies happen only at certain times of the year. Try to stay away from what causes your reaction if possible. Sometimes, it is hard to tell what causes your reaction. HOME CARE If you have a rash or red patches (hives) on your skin:  Take medicines as told by your doctor.   Do not drive or drink alcohol after taking medicines. They can make you sleepy.   Put cold cloths on your skin. Take baths in cool water. This will help your itching. Do not take hot baths or showers. Heat will make the itching worse.   If your allergies get worse, your doctor might give you other medicines. Talk to your doctor if problems continue.  GET HELP RIGHT AWAY IF:   You have trouble breathing.   You have a tight feeling in your chest or throat.   Your mouth gets puffy (swollen).   You have red, itchy patches on your skin (hives) that get worse.   You have itching all over your body.  MAKE SURE YOU:   Understand these instructions.   Will watch your condition.   Will get help right away if you are not doing well or get worse.  Document Released: 11/23/2009 Document Revised: 11/24/2011 Document Reviewed: 11/23/2009 [facility] Patient Information 2012 [place], [place].

## 2012-04-11 NOTE — ED Notes (Addendum)
Pt seen her 2 days ago for cellulitis to left knee, pt started on Septra DS and Norco. Pt took first dose of each Monday night, pt states she started itching Monday so she didn't take anymore Norco but continued to take Septra.  Pt states today, she developed red patches to legs and blisters to feet and hands and mouth.

## 2012-04-11 NOTE — ED Provider Notes (Addendum)
History     CSN: [id number]  Arrival date & time 04/11/12  1154   First MD Initiated Contact with Patient 04/11/12 1317      Chief Complaint  Patient presents with  . Medication Reaction    pt states she was started on hydrocodone and septra yesterday and has developed a rash on body and lip is now swelling. pt maintaining oral secretions. denies difficulty breathing.     (Consider location/radiation/quality/duration/timing/severity/associated sxs/prior treatment) The history is provided by the patient.   patient here with rash to her extremities after taking sulfa medication. Notes pruritus and intraoral swelling without trouble swallowing. No stridor. No medications taken for this prior to arrival. Also notes some blisters filled with blood on his hands.  Past Medical History  Diagnosis Date  . Hypertension   . Hyperlipemia   . Headache     migraine  . GERD (gastroesophageal reflux disease)   . Rectal bleeding   . Arthritis   . Blood transfusion   . COPD (chronic obstructive pulmonary disease)   . Weight loss   . Nasal congestion   . Wheezing   . Shortness of breath   . Asthma   . Lipoma     on abd  . Hemorrhoids     Past Surgical History  Procedure Date  . Heart surgery at 14yr     age 47 -flow throught heart not normal-per pt  . Appendectomy   . Colonoscopy   . Left ovary and fallopian tube removed   . Colonoscopy 12/22/2011    Procedure: COLONOSCOPY;  Surgeon: Shirley Friar, MD;  Location: [facility];  Service: Endoscopy;  Laterality: N/A;  . Ovarian tumor   . Breast tumor     left  . Knee arthroscopy     left x2  . Lipoma excision 02/15/2012    Procedure: EXCISION LIPOMA;  Surgeon: Clovis Pu. Cornett, MD;  Location: [facility];  Service: General;  Laterality: Left;  excision lipoma Left Lower abdomen    Family History  Problem Relation Age of Onset  . Colon cancer Father   . Cancer Father     colon  . Heart disease Father   .  Malignant hyperthermia Neg Hx   . Heart disease Paternal Uncle     History  Substance Use Topics  . Smoking status: Former Smoker    Quit date: 08/23/2011  . Smokeless tobacco: Not on file  . Alcohol Use: No    OB History    Grav Para Term Preterm Abortions TAB SAB Ect Mult Living                  Review of Systems  All other systems reviewed and are negative.    Allergies  Amoxicillin; Azithromycin; Clarithromycin; Penicillins; Propoxacet-n; and Sulfa antibiotics  Home Medications   Current Outpatient Rx  Name Route Sig Dispense Refill  . ALBUTEROL SULFATE HFA 108 (90 BASE) MCG/ACT IN AERS Inhalation Inhale 2 puffs into the lungs every 6 (six) hours as needed. Shortness of breath    . ESOMEPRAZOLE MAGNESIUM 40 MG PO CPDR Oral Take 40 mg by mouth daily before breakfast. DELAYED RELEASE    . FEXOFENADINE-PSEUDOEPHED ER 180-240 MG PO TB24 Oral Take 1 tablet by mouth daily.      Marland Kitchen HYDROCODONE-ACETAMINOPHEN 5-325 MG PO TABS Oral Take 1 tablet by mouth every 4 (four) hours as needed for pain. 15 tablet 0  . METOPROLOL SUCCINATE ER 50 MG PO TB24 Oral  Take 50 mg by mouth daily.      Marland Kitchen PRAVASTATIN SODIUM 20 MG PO TABS Oral Take 20 mg by mouth daily.    Marland Kitchen RABEPRAZOLE SODIUM 20 MG PO TBEC Oral Take 20 mg by mouth daily.      Marland Kitchen SIMVASTATIN 10 MG PO TABS Oral Take 10 mg by mouth at bedtime. No dosage listed, in evening       BP 150/82  Pulse 83  Temp(Src) 98 F (36.7 C) (Oral)  Resp 20  SpO2 99%  Physical Exam  Nursing note and vitals reviewed. Constitutional: She is oriented to person, place, and time. She appears well-developed and well-nourished.  Non-toxic appearance. No distress.  HENT:  Head: Normocephalic and atraumatic.       No mucosal lesions noted  Eyes: Conjunctivae, EOM and lids are normal. Pupils are equal, round, and reactive to light.  Neck: Normal range of motion. Neck supple. No tracheal deviation present. No mass present.  Cardiovascular: Normal rate,  regular rhythm and normal heart sounds.  Exam reveals no gallop.   No murmur heard. Pulmonary/Chest: Effort normal and breath sounds normal. No stridor. No respiratory distress. She has no decreased breath sounds. She has no wheezes. She has no rhonchi. She has no rales.       No stridor airway intact  Abdominal: Soft. Normal appearance and bowel sounds are normal. She exhibits no distension. There is no tenderness. There is no rebound and no CVA tenderness.  Musculoskeletal: Normal range of motion. She exhibits no edema and no tenderness.  Neurological: She is alert and oriented to person, place, and time. She has normal strength. No cranial nerve deficit or sensory deficit. GCS eye subscore is 4. GCS verbal subscore is 5. GCS motor subscore is 6.  Skin: Skin is warm and dry. No abrasion and no rash noted.       Hives-like rash noted to both knees in her arms. Patient with blood filled blister to her right and left hand.  Psychiatric: She has a normal mood and affect. Her speech is normal and behavior is normal.    ED Course  Procedures (including critical care time)   Labs Reviewed  CBC  DIFFERENTIAL  COMPREHENSIVE METABOLIC PANEL   No results found.   No diagnosis found.    MDM  Patient given a Medrol Benadryl and Pepcid. Patient's rash has improved. Blood blisters remain on her right and left hand. No mucosal involvement. No signs of Stevens-Johnson syndrome at this time. Patient will be placed on run his own and return here tomorrow for recheck   Patient also instructed to not use sulfa medications ever again     Toy Baker, MD 04/11/12 1514  Toy Baker, MD 04/11/12 [phone]

## 2012-04-11 NOTE — ED Provider Notes (Signed)
History/physical exam/procedure(s) were performed by non-physician practitioner and as supervising physician I was immediately available for consultation/collaboration. I have reviewed all notes and am in agreement with care and plan.   Chanler Schreiter S Tamera Pingley, MD 04/11/12 1540 

## 2012-06-19 ENCOUNTER — Telehealth (INDEPENDENT_AMBULATORY_CARE_PROVIDER_SITE_OTHER): Payer: Self-pay | Admitting: General Surgery

## 2012-06-19 NOTE — Telephone Encounter (Signed)
Pt's insurance will not cover med as written (Hydrocortisone/ Pramoxine), but will cover Protocsol 2.5%.  Dr. Luisa Hart OK'd the substitution to Proctosol.

## 2012-09-10 ENCOUNTER — Encounter (HOSPITAL_COMMUNITY): Payer: Self-pay | Admitting: Pharmacy Technician

## 2012-09-11 ENCOUNTER — Encounter (HOSPITAL_COMMUNITY)
Admission: RE | Admit: 2012-09-11 | Discharge: 2012-09-11 | Disposition: A | Discharge status: Home / Self Care | Payer: Medicare Other | Source: Ambulatory Visit | Attending: Orthopedic Surgery | Admitting: Orthopedic Surgery

## 2012-09-11 ENCOUNTER — Encounter (HOSPITAL_COMMUNITY)
Admission: RE | Admit: 2012-09-11 | Discharge: 2012-09-11 | Disposition: A | Discharge status: Home / Self Care | Payer: Medicare Other | Source: Ambulatory Visit | Attending: Surgery | Admitting: Surgery

## 2012-09-11 ENCOUNTER — Encounter (HOSPITAL_COMMUNITY): Payer: Self-pay | Admitting: Vascular Surgery

## 2012-09-11 ENCOUNTER — Ambulatory Visit (INDEPENDENT_AMBULATORY_CARE_PROVIDER_SITE_OTHER): Payer: Medicare Other | Admitting: Family Medicine

## 2012-09-11 ENCOUNTER — Encounter (HOSPITAL_COMMUNITY): Payer: Self-pay

## 2012-09-11 VITALS — BP 162/108 | HR 81 | Temp 98.3°F | Resp 20 | Ht 63.0 in | Wt 190.0 lb

## 2012-09-11 DIAGNOSIS — J449 Chronic obstructive pulmonary disease, unspecified: Secondary | ICD-10-CM

## 2012-09-11 DIAGNOSIS — Z01812 Encounter for preprocedural laboratory examination: Secondary | ICD-10-CM | POA: Insufficient documentation

## 2012-09-11 DIAGNOSIS — J45909 Unspecified asthma, uncomplicated: Secondary | ICD-10-CM | POA: Insufficient documentation

## 2012-09-11 DIAGNOSIS — I10 Essential (primary) hypertension: Secondary | ICD-10-CM

## 2012-09-11 DIAGNOSIS — K219 Gastro-esophageal reflux disease without esophagitis: Secondary | ICD-10-CM

## 2012-09-11 DIAGNOSIS — Z01818 Encounter for other preprocedural examination: Secondary | ICD-10-CM | POA: Insufficient documentation

## 2012-09-11 HISTORY — DX: Frequency of micturition: R35.0

## 2012-09-11 LAB — COMPREHENSIVE METABOLIC PANEL
ALT: 19 U/L (ref 0–35)
AST: 18 U/L (ref 0–37)
Albumin: 3.8 g/dL (ref 3.5–5.2)
Alkaline Phosphatase: 64 U/L (ref 39–117)
BUN: 18 mg/dL (ref 6–23)
CO2: 23 mEq/L (ref 19–32)
Calcium: 9.3 mg/dL (ref 8.4–10.5)
Chloride: 103 mEq/L (ref 96–112)
Creatinine, Ser: 0.5 mg/dL (ref 0.50–1.10)
GFR calc Af Amer: >90 mL/min (ref >90)
GFR calc non Af Amer: >90 mL/min (ref >90)
Glucose, Bld: 98 mg/dL (ref 70–99)
Potassium: 4 mEq/L (ref 3.5–5.1)
Sodium: 138 mEq/L (ref 135–145)
Total Bilirubin: 0.2 mg/dL — ABNORMAL LOW (ref 0.3–1.2)
Total Protein: 7.1 g/dL (ref 6.0–8.3)

## 2012-09-11 LAB — URINALYSIS, ROUTINE W REFLEX MICROSCOPIC
Bilirubin Urine: NEGATIVE
Glucose, UA: NEGATIVE mg/dL
Ketones, ur: NEGATIVE mg/dL
Leukocytes, UA: NEGATIVE
Nitrite: NEGATIVE
Protein, ur: NEGATIVE mg/dL
Specific Gravity, Urine: 1.008 (ref 1.005–1.030)
Urobilinogen, UA: 0.2 mg/dL (ref 0.0–1.0)
pH: 6.5 (ref 5.0–8.0)

## 2012-09-11 LAB — TYPE AND SCREEN
ABO/RH(D): A POS
Antibody Screen: NEGATIVE

## 2012-09-11 LAB — ABO/RH: ABO/RH(D): A POS

## 2012-09-11 LAB — SURGICAL PCR SCREEN
MRSA, PCR: NEGATIVE
Staphylococcus aureus: NEGATIVE

## 2012-09-11 LAB — URINE MICROSCOPIC-ADD ON

## 2012-09-11 LAB — CBC
HCT: 38.6 % (ref 36.0–46.0)
Hemoglobin: 13 g/dL (ref 12.0–15.0)
MCH: 28.2 pg (ref 26.0–34.0)
MCHC: 33.7 g/dL (ref 30.0–36.0)
MCV: 83.7 fL (ref 78.0–100.0)
Platelets: 249 10*3/uL (ref 150–400)
RBC: 4.61 MIL/uL (ref 3.87–5.11)
RDW: 13.9 % (ref 11.5–15.5)
WBC: 8.8 10*3/uL (ref 4.0–10.5)

## 2012-09-11 LAB — PROTIME-INR
INR: 0.93 (ref 0.00–1.49)
Prothrombin Time: 12.4 seconds (ref 11.6–15.2)

## 2012-09-11 LAB — APTT: aPTT: 29 seconds (ref 24–37)

## 2012-09-11 MED ORDER — ESOMEPRAZOLE MAGNESIUM 40 MG PO CPDR: 40.0000 mg | DELAYED_RELEASE_CAPSULE | Freq: Every day | ORAL | Status: DC

## 2012-09-11 MED ORDER — ALBUTEROL SULFATE HFA 108 (90 BASE) MCG/ACT IN AERS: 2.0000 | INHALATION_SPRAY | Freq: Four times a day (QID) | RESPIRATORY_TRACT | Status: DC | PRN

## 2012-09-11 MED ORDER — SIMVASTATIN 10 MG PO TABS: 10.0000 mg | ORAL_TABLET | Freq: Every day | ORAL | Status: DC

## 2012-09-11 MED ORDER — METOPROLOL SUCCINATE ER 50 MG PO TB24: 50.0000 mg | ORAL_TABLET | Freq: Every day | ORAL | Status: DC

## 2012-09-11 NOTE — Patient Instructions (Addendum)
Thank you for coming in today. Please schedule an appointment with a regular doctor in the [facility] building in about 3-4 weeks.  Restart all your medicines.  Let me know if you feel bad.  We are open 7 days a week.  I will let Dr. Eulah Pont know about the surgery. We will have to cancel. Call or go to the emergency room if you get worse, have trouble breathing, have chest pains, or palpitations.

## 2012-09-11 NOTE — Consult Note (Addendum)
Anesthesia consult: Patient is a 66 year old female scheduled for a left total knee arthroplasty by Dr. Eulah Pont on 09/19/2012.  I examined her during her PAT visit on 09/11/12.    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  A thoracotomy approach was used.  She's unsure of the specifics, and said she hasn't really been evaluated by a Cardiologist since then.  She was previously a patient at [facility] which recently closed its doors.  Dr. Eulah Pont saw her on 09/11/12 and had her go to the [facility] that evening to get established and to also evaluate for wheezing and HTN.  She was restarted on Toprol and Nexium  She has tolerated multiple surgeries in the past, most recently an abdominal lipoma resection on 02/15/12.    She denies history of chest pain.  She has some SOB when her asthma is acting up, but otherwise does not feel limited by her breathing.  She had been off most of her medications since [facility] closed.  She does have an albuterol MDI. I did not hear any wheezing on my 09/11/12 exam.  Heart had a RRR.  I did not appreciate any murmurs or carotid bruits.  She has minimal (non-pitting) pedal edema.  She is obese and has a left thoracotomy incisional scar.    EKG on 02/10/12 showed NSR.  CXR report from 09/11/12 showed: Midline trachea. Patient minimally rotated left. Normal  heart size and mediastinal contours. No pleural effusion or  pneumothorax. The right lung is clear. There is interstitial  thickening versus mild scarring at the left lung base. Minimal S- shaped thoracolumbar spine curvature.  Labs noted.  I requested records from [facility], but due to the  significant amount of time that has pasted since her surgery, I'm doubtful that her old records will be readily assessible.  I spoke with Ms. Fabrizio again today.  She saw Dr. Denyse Amass as planned yesterday and was told her surgery would need to be canceled.  However, she is scheduled to see Dr. Audria Nine for follow-up on 09/14/12.  Currently, her case is still on the OR schedule.  I spoke with Silvestre Mesi at Dr. Greig Right office who will clarify.  I did review her cardiac history with Anesthesiologist Dr. Michelle Piper.  He felt that since she has not had any CHF or chest pain symptoms that anesthesia would not require a preoperative Cardiology evaluation prior to this procedure.  Additional evaluation, if any, will be deferred to her PCP.  If her case remains on the OR schedule for next week, then I'll follow-up her 09/14/12 office note to ensure no additional recommendations.    Shonna Chock, PA-C [facility] Short Stay/Anesthesiology 09/12/12 1204

## 2012-09-11 NOTE — Progress Notes (Signed)
Pt states she is going to urgent care today for evaluation of wheezing; states Dr Eulah Pont is aware pt has been off her meds for several months.

## 2012-09-11 NOTE — Progress Notes (Signed)
Summer Jackson is a 66 y.o. female who presents to [facility] today for to establish care.  Patient was seen today at [facility] for her H&P prior to a planned knee replacement.  She was noted to be hypertensive had coarse breath sounds in and out of her pulmonary and cardiac medications for 2 years.  She is here today to establish care and plans for eventual clearance for her planned knee replacement.   She currently feels well aside from chronic knee pain.  No fevers chills weight loss trouble breathing chest pains or palpitations.   PMH: Reviewed hypertension GERD COPD History  Substance Use Topics  . Smoking status: Former Smoker -- 2.0 packs/day for 35 years    Types: Cigarettes    Quit date: 08/23/2011  . Smokeless tobacco: Never Used  . Alcohol Use: No   ROS as above  Medications reviewed. Current Outpatient Prescriptions  Medication Sig Dispense Refill  . albuterol (PROVENTIL HFA;VENTOLIN HFA) 108 (90 BASE) MCG/ACT inhaler Inhale 2 puffs into the lungs every 6 (six) hours as needed. Shortness of breath  1 Inhaler  2  . esomeprazole (NEXIUM) 40 MG capsule Take 1 capsule (40 mg total) by mouth daily before breakfast.  30 capsule  2  . metoprolol succinate (TOPROL-XL) 50 MG 24 hr tablet Take 1 tablet (50 mg total) by mouth daily.  30 tablet  2  . simvastatin (ZOCOR) 10 MG tablet Take 1 tablet (10 mg total) by mouth at bedtime.  30 tablet  2  . topiramate (TOPAMAX) 50 MG tablet Take 50 mg by mouth 2 (two) times daily.      Marland Kitchen DISCONTD: albuterol (PROVENTIL HFA;VENTOLIN HFA) 108 (90 BASE) MCG/ACT inhaler Inhale 2 puffs into the lungs every 6 (six) hours as needed. Shortness of breath      . DISCONTD: esomeprazole (NEXIUM) 40 MG capsule Take 40 mg by mouth daily before breakfast.       . DISCONTD: metoprolol (TOPROL-XL) 50 MG 24 hr tablet Take 50 mg by mouth daily.        Marland Kitchen DISCONTD: simvastatin (ZOCOR) 10 MG tablet Take 10 mg by mouth at bedtime.         Exam:  BP 162/108   Pulse 81  Temp 98.3 F (36.8 C) (Oral)  Resp 20  Ht 5\' 3"  (1.6 m)  Wt 190 lb (86.183 kg)  BMI 33.66 kg/m2  SpO2 96% Gen: Well NAD HEENT: EOMI,  MMM Lungs: CTABL Nl WOB Heart: RRR no MRG Abd: NABS, NT, ND Exts: Non edematous BL  LE, warm and well perfused.   Results for orders placed during the hospital encounter of 09/11/12 (from the past 72 hour(s))  URINALYSIS, ROUTINE W REFLEX MICROSCOPIC     Status: Abnormal   Collection Time   09/11/12 12:24 PM      Component Value Range Comment   Color, Urine YELLOW  YELLOW    APPearance CLEAR  CLEAR    Specific Gravity, Urine 1.008  1.005 - 1.030    pH 6.5  5.0 - 8.0    Glucose, UA NEGATIVE  NEGATIVE mg/dL    Hgb urine dipstick SMALL (*) NEGATIVE    Bilirubin Urine NEGATIVE  NEGATIVE    Ketones, ur NEGATIVE  NEGATIVE mg/dL    Protein, ur NEGATIVE  NEGATIVE mg/dL    Urobilinogen, UA 0.2  0.0 - 1.0 mg/dL    Nitrite NEGATIVE  NEGATIVE    Leukocytes, UA NEGATIVE  NEGATIVE   SURGICAL PCR SCREEN  Status: Normal   Collection Time   09/11/12 12:24 PM      Component Value Range Comment   MRSA, PCR NEGATIVE  NEGATIVE    Staphylococcus aureus NEGATIVE  NEGATIVE   URINE MICROSCOPIC-ADD ON     Status: Normal   Collection Time   09/11/12 12:24 PM      Component Value Range Comment   Squamous Epithelial / LPF RARE  RARE    WBC, UA 0-2  <3 WBC/hpf    RBC / HPF 3-6  <3 RBC/hpf    Bacteria, UA RARE  RARE   APTT     Status: Normal   Collection Time   09/11/12 12:25 PM      Component Value Range Comment   aPTT 29  24 - 37 seconds   CBC     Status: Normal   Collection Time   09/11/12 12:25 PM      Component Value Range Comment   WBC 8.8  4.0 - 10.5 K/uL    RBC 4.61  3.87 - 5.11 MIL/uL    Hemoglobin 13.0  12.0 - 15.0 g/dL    HCT 16.1  09.6 - 04.5 %    MCV 83.7  78.0 - 100.0 fL    MCH 28.2  26.0 - 34.0 pg    MCHC 33.7  30.0 - 36.0 g/dL    RDW 40.9  81.1 - 91.4 %    Platelets 249  150 - 400 K/uL   COMPREHENSIVE METABOLIC PANEL     Status:  Abnormal   Collection Time   09/11/12 12:25 PM      Component Value Range Comment   Sodium 138  135 - 145 mEq/L    Potassium 4.0  3.5 - 5.1 mEq/L    Chloride 103  96 - 112 mEq/L    CO2 23  19 - 32 mEq/L    Glucose, Bld 98  70 - 99 mg/dL    BUN 18  6 - 23 mg/dL    Creatinine, Ser 7.82  0.50 - 1.10 mg/dL    Calcium 9.3  8.4 - 95.6 mg/dL    Total Protein 7.1  6.0 - 8.3 g/dL    Albumin 3.8  3.5 - 5.2 g/dL    AST 18  0 - 37 U/L    ALT 19  0 - 35 U/L    Alkaline Phosphatase 64  39 - 117 U/L    Total Bilirubin 0.2 (*) 0.3 - 1.2 mg/dL    GFR calc non Af Amer >90  >90 mL/min    GFR calc Af Amer >90  >90 mL/min   PROTIME-INR     Status: Normal   Collection Time   09/11/12 12:25 PM      Component Value Range Comment   Prothrombin Time 12.4  11.6 - 15.2 seconds    INR 0.93  0.00 - 1.49   TYPE AND SCREEN     Status: Normal   Collection Time   09/11/12 12:50 PM      Component Value Range Comment   ABO/RH(D) A POS      Antibody Screen NEG      Sample Expiration 09/25/2012     ABO/RH     Status: Normal   Collection Time   09/11/12 12:50 PM      Component Value Range Comment   ABO/RH(D) A POS       Assessment and Plan: 66 y.o. female with   1) hypertension: Elevated today. Asymptomatic. Plan  to restart the Toprol.  Followup in 3-4 weeks at [facility] [address]. 2) COPD. Refilled albuterol inhaler. Reviewed x-rays which look reassuring. Followup as above 3) GERD: Refilled Nexium. Followup as above 4) laboratory findings: Preoperation labs and x-rays reviewed. Discussed with the patient.  Discussed warning signs or symptoms. Please see discharge instructions. Patient expresses understanding.

## 2012-09-11 NOTE — Pre-Procedure Instructions (Signed)
[name]  09/11/2012   Your procedure is scheduled on:  Wednesday, Oct 2  Report to [facility] at 0630 AM.  Call this number if you have problems the morning of surgery: [phone]   Remember:   Do not eat food or drink liquids:After Midnight.   Take these medicines the morning of surgery with A SIP OF WATER: Inhaler   Do not wear jewelry, make-up or nail polish.  Do not wear lotions, powders, or perfumes. You may wear deodorant.  Do not shave 48 hours prior to surgery. Men may shave face and neck.  Do not bring valuables to the hospital.  Contacts, dentures or bridgework may not be worn into surgery.  Leave suitcase in the car. After surgery it may be brought to your room.  For patients admitted to the hospital, checkout time is 11:00 AM the day of discharge.   Patients discharged the day of surgery will not be allowed to drive home.  Name and phone number of your driver: n/a  Special Instructions: Incentive Spirometry - Practice and bring it with you on the day of surgery. Shower using CHG 2 nights before surgery and the night before surgery.  If you shower the day of surgery use CHG.  Use special wash - you have one bottle of CHG for all showers.  You should use approximately 1/3 of the bottle for each shower.   Please read over the following fact sheets that you were given: Pain Booklet, Coughing and Deep Breathing, Blood Transfusion Information, Total Joint Packet, MRSA Information and Surgical Site Infection Prevention

## 2012-09-14 ENCOUNTER — Ambulatory Visit (INDEPENDENT_AMBULATORY_CARE_PROVIDER_SITE_OTHER): Payer: Medicare Other | Admitting: Family Medicine

## 2012-09-14 ENCOUNTER — Encounter: Payer: Self-pay | Admitting: Family Medicine

## 2012-09-14 VITALS — BP 124/79 | HR 72 | Temp 97.9°F | Resp 16 | Ht 62.0 in | Wt 187.2 lb

## 2012-09-14 DIAGNOSIS — J45909 Unspecified asthma, uncomplicated: Secondary | ICD-10-CM

## 2012-09-14 DIAGNOSIS — I10 Essential (primary) hypertension: Secondary | ICD-10-CM

## 2012-09-14 DIAGNOSIS — J309 Allergic rhinitis, unspecified: Secondary | ICD-10-CM

## 2012-09-14 MED ORDER — BECLOMETHASONE DIPROPIONATE 80 MCG/ACT IN AERS: 1.0000 | INHALATION_SPRAY | Freq: Two times a day (BID) | RESPIRATORY_TRACT | Status: DC

## 2012-09-14 MED ORDER — TOPIRAMATE 50 MG PO TABS: 50.0000 mg | ORAL_TABLET | Freq: Two times a day (BID) | ORAL | Status: DC

## 2012-09-14 MED ORDER — BECLOMETHASONE DIPROPIONATE 80 MCG/ACT IN AERS: 1.0000 | INHALATION_SPRAY | RESPIRATORY_TRACT | Status: DC | PRN

## 2012-09-14 MED ORDER — LISINOPRIL 20 MG PO TABS: 20.0000 mg | ORAL_TABLET | Freq: Every day | ORAL | Status: DC

## 2012-09-14 MED ORDER — LISINOPRIL 10 MG PO TABS: 10.0000 mg | ORAL_TABLET | Freq: Every day | ORAL | Status: DC

## 2012-09-14 MED ORDER — CETIRIZINE HCL 10 MG PO TABS: 10.0000 mg | ORAL_TABLET | Freq: Every day | ORAL | Status: DC

## 2012-09-14 NOTE — Patient Instructions (Addendum)
Your new blood pressure medication is Lisinopril 10 mg; take 1 tablet every morning.  Allergy medication has been changed to Cetirizine 10 mg 1 tablet every evening to control allergy symptoms. Asthma medication: I have added a steroid inhaler- QVAR.  Use this inhaler twice a day to control your symptoms and use the Albuterol as a "rescue"-as needed when you have shortness of breath or a lot of coughing.   Hypertension Hypertension is another name for high blood pressure. High blood pressure may mean that your heart needs to work harder to pump blood. Blood pressure consists of two numbers, which includes a higher number over a lower number (example: 110/72). HOME CARE   Make lifestyle changes as told by your doctor. This may include weight loss and exercise.   Take your blood pressure medicine every day.   Limit how much salt you use.   Stop smoking if you smoke.   Do not use drugs.   Talk to your doctor if you are using decongestants or birth control pills. These medicines might make blood pressure higher.   Females should not drink more than 1 alcoholic drink per day. Males should not drink more than 2 alcoholic drinks per day.   See your doctor as told.  GET HELP RIGHT AWAY IF:   You have a blood pressure reading with a top number of 180 or higher.   You get a very bad headache.   You get blurred or changing vision.   You feel confused.   You feel weak, numb, or faint.   You get chest or belly (abdominal) pain.   You throw up (vomit).   You cannot breathe very well.  MAKE SURE YOU:   Understand these instructions.   Will watch your condition.   Will get help right away if you are not doing well or get worse.  Document Released: 05/23/2008 Document Revised: 11/24/2011 Document Reviewed: 05/23/2008 [facility] Patient Information 2012 [place], [place].     Asthma Prevention Cigarette smoke, house dust, molds, pollens, animal dander, certain insects, exercise, and  even cold air are all triggers that can cause an asthma attack. Often, no specific triggers are identified.  Take the following measures around your house to reduce attacks:  Avoid cigarette and other smoke. No smoking should be allowed in a home where someone with asthma lives. If smoking is allowed indoors, it should be done in a room with a closed door, and a window should be opened to clear the air. If possible, do not use a wood-burning stove, kerosene heater, or fireplace. Minimize exposure to all sources of smoke, including incense, candles, fires, and fireworks.   Decrease pollen exposure. Keep your windows shut and use central air during the pollen allergy season. Stay indoors with windows closed from late morning to afternoon, if you can. Avoid mowing the lawn if you have grass pollen allergy. Change your clothes and shower after being outside during this time of year.   Remove molds from bathrooms and wet areas. Do this by cleaning the floors with a fungicide or diluted bleach. Avoid using humidifiers, vaporizers, or swamp coolers. These can spread molds through the air. Fix leaky faucets, pipes, or other sources of water that have mold around them.   Decrease house dust exposure. Do this by using bare floors, vacuuming frequently, and changing furnace and air cooler filters frequently. Avoid using feather, wool, or foam bedding. Use polyester pillows and plastic covers over your mattress. Wash bedding weekly in hot  water (hotter than 130 F).   Try to get someone else to vacuum for you once or twice a week, if you can. Stay out of rooms while they are being vacuumed and for a short while afterward. If you vacuum, use a dust mask (from a hardware store), a double-layered or microfilter vacuum cleaner bag, or a vacuum cleaner with a HEPA filter.   Avoid perfumes, talcum powder, hair spray, paints and other strong odors and fumes.   Keep warm-blooded pets (cats, dogs, rodents, birds) outside  the home if they are triggers for asthma. If you can't keep the pet outdoors, keep the pet out of your bedroom and other sleeping areas at all times, and keep the door closed. Remove carpets and furniture covered with cloth from your home. If that is not possible, keep the pet away from fabric-covered furniture and carpets.   Eliminate cockroaches. Keep food and garbage in closed containers. Never leave food out. Use poison baits, traps, powders, gels, or paste (for example, boric acid). If a spray is used to kill cockroaches, stay out of the room until the odor goes away.   Decrease indoor humidity to less than 60%. Use an indoor air cleaning device.   Avoid sulfites in foods and beverages. Do not drink beer or wine or eat dried fruit, processed potatoes, or shrimp if they cause asthma symptoms.   Avoid cold air. Cover your nose and mouth with a scarf on cold or windy days.   Avoid aspirin. This is the most common drug causing serious asthma attacks.   If exercise triggers your asthma, ask your caregiver how you should prepare before exercising. (For example, ask if you could use your inhaler 10 minutes before exercising.)   Avoid close contact with people who have a cold or the flu since your asthma symptoms may get worse if you catch the infection from them. Wash your hands thoroughly after touching items that may have been handled by others with a respiratory infection.   Get a flu shot every year to protect against the flu virus, which often makes asthma worse for days to weeks. Also get a pneumonia shot once every five to 10 years.  Call your caregiver if you want further information about measures you can take to help prevent asthma attacks. Document Released: 12/05/2005 Document Revised: 11/24/2011 Document Reviewed: 10/13/2009 [facility] Patient Information 2012 [place], [place].

## 2012-09-14 NOTE — Progress Notes (Signed)
  Subjective:    Patient ID: Summer Jackson, female    DOB: [birth year], 66 y.o.   MRN: [id number]  HPI  This 66 y.o. Cauc femlae has HTN and Asthma. She is here to establish primary care as  requested by her Orthopedist (prior to knee surgery). She was scheduled to have a left knee  procedure in October but it has been postponed until chronic medical problems are stable. She takes Metoprolol succinate 50 mg daily for years.  Asthma is treated with only Albuterol MDI  which pt uses daily; she has nocturnal symptoms with cough. She has never used any other MDI  for treatment of Asthma.    Review of Systems  Constitutional: Negative for fever, chills, diaphoresis and unexpected weight change.  HENT: Positive for congestion, rhinorrhea and sneezing. Negative for sore throat, postnasal drip and sinus pressure.   Respiratory: Positive for cough. Negative for chest tightness and wheezing.   Cardiovascular: Positive for leg swelling. Negative for chest pain and palpitations.  Musculoskeletal: Positive for arthralgias.       Left knee surgery is pending  Skin:       Has firm skin lesion on left side of nose (bridge)  Neurological: Negative for dizziness, syncope, weakness, light-headedness and headaches.  Psychiatric/Behavioral: Negative.        Objective:   Physical Exam  Nursing note and vitals reviewed. Constitutional: She is oriented to person, place, and time. She appears well-developed and well-nourished. No distress.  HENT:  Head: Normocephalic and atraumatic.  Right Ear: External ear normal.  Left Ear: External ear normal.  Mouth/Throat: Oropharynx is clear and moist.       Bridge of nose (left side): firm subcutaneous area (2 mm x 3mm) w/o erythema or ulceration. Eyeglasses nose guard sits at this spot.    Eyes: Conjunctivae normal and EOM are normal. No scleral icterus.  Cardiovascular: Normal rate, regular rhythm and normal heart sounds.  Exam reveals no gallop.   No murmur  heard. Pulmonary/Chest: Effort normal and breath sounds normal. No respiratory distress. She has no wheezes.  Musculoskeletal: Normal range of motion.       Trace pedal and pretibial edema  Neurological: She is alert and oriented to person, place, and time. Coordination normal.  Skin: Skin is warm and dry.  Psychiatric: She has a normal mood and affect. Her behavior is normal.          Assessment & Plan:   1. HTN (hypertension)   D/C Metoprolol (may be exacerbating asthma symptoms). RX: Lisinopril 10 mg  1 tablet daily  2. Asthma  RX: QVAR 80 mcg  1 inhalation every 12 hours;rinse after use Use Albuterol as "rescue" inhaler; this was reviewed at length with pt.  3.  Allergic rhinitis  RX: Cetirizine 10 mg  1 tab in evening for allergies   RX: Zostavax x 1 dose given to pt.

## 2012-09-19 ENCOUNTER — Inpatient Hospital Stay (HOSPITAL_COMMUNITY): Admission: RE | Admit: 2012-09-19 | Payer: Medicare Other | Source: Ambulatory Visit | Admitting: Orthopedic Surgery

## 2012-09-19 ENCOUNTER — Encounter (HOSPITAL_COMMUNITY): Admission: RE | Payer: Self-pay | Source: Ambulatory Visit

## 2012-09-19 SURGERY — ARTHROPLASTY, KNEE, TOTAL
Anesthesia: General | Laterality: Left

## 2012-09-30 ENCOUNTER — Emergency Department (HOSPITAL_COMMUNITY)
Admission: EM | Admit: 2012-09-30 | Discharge: 2012-09-30 | Disposition: A | Discharge status: Home / Self Care | Payer: Medicare Other | Attending: Emergency Medicine | Admitting: Emergency Medicine

## 2012-09-30 ENCOUNTER — Encounter (HOSPITAL_COMMUNITY): Payer: Self-pay | Admitting: *Deleted

## 2012-09-30 DIAGNOSIS — M79673 Pain in unspecified foot: Secondary | ICD-10-CM

## 2012-09-30 DIAGNOSIS — Z881 Allergy status to other antibiotic agents status: Secondary | ICD-10-CM | POA: Insufficient documentation

## 2012-09-30 DIAGNOSIS — J45909 Unspecified asthma, uncomplicated: Secondary | ICD-10-CM | POA: Insufficient documentation

## 2012-09-30 DIAGNOSIS — Z800 Family history of malignant neoplasm of digestive organs: Secondary | ICD-10-CM | POA: Insufficient documentation

## 2012-09-30 DIAGNOSIS — I10 Essential (primary) hypertension: Secondary | ICD-10-CM | POA: Insufficient documentation

## 2012-09-30 DIAGNOSIS — Z880 Allergy status to penicillin: Secondary | ICD-10-CM | POA: Insufficient documentation

## 2012-09-30 DIAGNOSIS — K219 Gastro-esophageal reflux disease without esophagitis: Secondary | ICD-10-CM | POA: Insufficient documentation

## 2012-09-30 DIAGNOSIS — Z882 Allergy status to sulfonamides status: Secondary | ICD-10-CM | POA: Insufficient documentation

## 2012-09-30 DIAGNOSIS — Z8249 Family history of ischemic heart disease and other diseases of the circulatory system: Secondary | ICD-10-CM | POA: Insufficient documentation

## 2012-09-30 DIAGNOSIS — Z8489 Family history of other specified conditions: Secondary | ICD-10-CM | POA: Insufficient documentation

## 2012-09-30 DIAGNOSIS — M79609 Pain in unspecified limb: Secondary | ICD-10-CM | POA: Insufficient documentation

## 2012-09-30 DIAGNOSIS — Z888 Allergy status to other drugs, medicaments and biological substances status: Secondary | ICD-10-CM | POA: Insufficient documentation

## 2012-09-30 MED ORDER — DIPHENHYDRAMINE HCL 25 MG PO CAPS
50.0000 mg | ORAL_CAPSULE | Freq: Once | ORAL | Status: AC
  Administered 2012-09-30: 50 mg via ORAL
  Filled 2012-09-30: qty 2

## 2012-09-30 MED ORDER — CEPHALEXIN 500 MG PO CAPS: 500.0000 mg | ORAL_CAPSULE | Freq: Four times a day (QID) | ORAL | Status: DC

## 2012-09-30 NOTE — ED Provider Notes (Signed)
History     CSN: [id number]  Arrival date & time 09/30/12  1927   First MD Initiated Contact with Patient 09/30/12 2020      Chief Complaint  Patient presents with  . Foot Pain    (Consider location/radiation/quality/duration/timing/severity/associated sxs/prior treatment) HPI Comments: Patient presents with bilateral foot pain. It began a few hours ago. She first noticed two "knots" on the bottom of her feet when she went to put on her slippers earlier tonight. She wore a new pair of boots earlier today. The "knots" are painful to touch. Pain is described as sharp and worsened with walking. The tops of her feet have also become itchy. She has not tried anything to improve the symptoms. She has never experienced anything like this before. She has not tried any new lotions or recently been outside in grasses or around bugs. Denies fever, chills, nausea, vomiting, or diarrhea.   Patient is a 66 y.o. female presenting with lower extremity pain. The history is provided by the patient.  Foot Pain Pertinent negatives include no chills, fever, nausea or vomiting.    Past Medical History  Diagnosis Date  . Hypertension   . Hyperlipemia   . Headache     migraine  . GERD (gastroesophageal reflux disease)   . Rectal bleeding   . Arthritis   . Blood transfusion   . COPD (chronic obstructive pulmonary disease)   . Weight loss   . Nasal congestion   . Wheezing   . Shortness of breath   . Asthma   . Lipoma     on abd  . Hemorrhoids   . Urinary frequency     Past Surgical History  Procedure Date  . Heart surgery at 89yr     age 62 -flow through heart not normal-per pt  . Appendectomy   . Colonoscopy   . Left ovary and fallopian tube removed   . Colonoscopy 64/02/2012    Procedure: COLONOSCOPY;  Surgeon: Shirley Friar, MD;  Location: [facility];  Service: Endoscopy;  Laterality: N/A;  . Ovarian tumor   . Breast tumor     left  . Knee arthroscopy     left x2  . Lipoma  excision 02/15/2012    Procedure: EXCISION LIPOMA;  Surgeon: Clovis Pu. Cornett, MD;  Location: [facility];  Service: General;  Laterality: Left;  excision lipoma Left Lower abdomen  . Breast surgery     Family History  Problem Relation Age of Onset  . Colon cancer Father   . Cancer Father     colon  . Heart disease Father   . Malignant hyperthermia Neg Hx   . Heart disease Paternal Uncle     History  Substance Use Topics  . Smoking status: Former Smoker -- 2.0 packs/day for 35 years    Types: Cigarettes    Quit date: 08/23/2011  . Smokeless tobacco: Never Used  . Alcohol Use: No    OB History    Grav Para Term Preterm Abortions TAB SAB Ect Mult Living                  Review of Systems  Constitutional: Negative for fever and chills.  Gastrointestinal: Negative for nausea, vomiting and diarrhea.  Skin: Positive for color change.    Allergies  Sulfa antibiotics; Amoxicillin; Azithromycin; Clarithromycin; Penicillins; and Propoxyphene-acetaminophen  Home Medications   Current Outpatient Rx  Name Route Sig Dispense Refill  . ALBUTEROL SULFATE HFA 108 (90 BASE) MCG/ACT  IN AERS Inhalation Inhale 2 puffs into the lungs every 6 (six) hours as needed. Shortness of breath    . BECLOMETHASONE DIPROPIONATE 80 MCG/ACT IN AERS Inhalation Inhale 1 puff into the lungs 2 (two) times daily. Rinse mouth after use.    Marland Kitchen CETIRIZINE HCL 10 MG PO TABS Oral Take 10 mg by mouth daily.    Marland Kitchen ESOMEPRAZOLE MAGNESIUM 40 MG PO CPDR Oral Take 40 mg by mouth daily before breakfast.    . LISINOPRIL 10 MG PO TABS Oral Take 10 mg by mouth daily.    Marland Kitchen SIMVASTATIN 10 MG PO TABS Oral Take 10 mg by mouth at bedtime.    . TOPIRAMATE 50 MG PO TABS Oral Take 50 mg by mouth 2 (two) times daily.      BP 142/61  Pulse 80  Temp 98.5 F (36.9 C) (Oral)  Resp 16  Ht 5\' 3"  (1.6 m)  Wt 180 lb (81.647 kg)  BMI 31.89 kg/m2  SpO2 96%  Physical Exam  Constitutional: She appears well-developed  and well-nourished.  Cardiovascular: Intact distal pulses.   Musculoskeletal:       Feet:       Full ROM of ankles and toes bilaterally.  Neurological:       Normal sensation to light palpation in feet bilaterally. 5/5 strength bilaterally.   Skin:       2 cm erythematous area of mild swelling on arches of feet bilaterally. They are tender to palpation. Dorsal surface of both feet are erythematous, with right greater than left. Scratch marks are visible. Areas of skin are warm to the touch. No red streaks up the legs. No inguinal lymphadenopathy.    ED Course  Procedures (including critical care time)  Labs Reviewed - No data to display No results found.   No diagnosis found.    MDM  Cellulitis vs dermatitis  66 year old woman presents to ER complaining of bilateral erythema, pruritus and plantar pain after wearing new pair of boots.  Question cellulitis versus dermatitis.  Patient will be treated with Keflex and strict return precautions for skin check in 3 days.        Jaci Carrel, [place] 09/30/12 2132

## 2012-09-30 NOTE — ED Provider Notes (Signed)
She with mild redness and tenderness to the plantar surfaces of both feet. Minimally reddened and tender at plantar surfaces and minimally tender dorsal surfaces no red streaks up the leg DP pulses 2+ bilaterally  Doug Sou, MD 09/30/12 2218

## 2012-09-30 NOTE — ED Notes (Signed)
Patient given discharge instructions, information, prescriptions, and diet order. Patient states that they adequately understand discharge information given and to return to ED if symptoms return or worsen.     

## 2012-09-30 NOTE — ED Notes (Signed)
Pt from home sts she began having bilateral foot pain earlier tonight, approx. 1 hour ago. Sts it feels like there are "knots on my feet." Sts feet are redder than normal. Denies any hx of diabetes.

## 2012-10-01 NOTE — ED Provider Notes (Signed)
Medical screening examination/treatment/procedure(s) were conducted as a shared visit with non-physician practitioner(s) and myself.  I personally evaluated the patient during the encounter  Doug Sou, MD 10/01/12 [phone]

## 2012-10-05 ENCOUNTER — Encounter: Payer: Self-pay | Admitting: Family Medicine

## 2012-10-05 ENCOUNTER — Ambulatory Visit (INDEPENDENT_AMBULATORY_CARE_PROVIDER_SITE_OTHER): Payer: Medicare Other | Admitting: Family Medicine

## 2012-10-05 VITALS — BP 130/72 | HR 78 | Temp 98.0°F | Resp 16 | Ht 62.0 in | Wt 188.4 lb

## 2012-10-05 DIAGNOSIS — M25551 Pain in right hip: Secondary | ICD-10-CM

## 2012-10-05 DIAGNOSIS — Z01818 Encounter for other preprocedural examination: Secondary | ICD-10-CM

## 2012-10-05 DIAGNOSIS — M25559 Pain in unspecified hip: Secondary | ICD-10-CM

## 2012-10-05 MED ORDER — METHYLPREDNISOLONE ACETATE 40 MG/ML IJ SUSP
80.0000 mg | Freq: Once | INTRAMUSCULAR | Status: AC
  Administered 2012-10-05: 80 mg via INTRAMUSCULAR

## 2012-10-05 MED ORDER — KETOROLAC TROMETHAMINE 30 MG/ML IJ SOLN
30.0000 mg | Freq: Once | INTRAMUSCULAR | Status: AC
  Administered 2012-10-05: 30 mg via INTRAMUSCULAR

## 2012-10-05 NOTE — Patient Instructions (Addendum)
Extra strength Tylenol 500 mg  2 tablets every 8 hours can help reduce your hip pain and other joint pain. I will be sending a letter to Dr. Eulah Pont to clear you for knee surgery.  [facility] the "[facility] " exercise program for senior citizens.  Today you received 2 injections- one is to reduce inflammation and the other is for pain. You should have no sedation as a result of these injections.  Take it easy the rest of today and for brief peiiods tomorrow to allow your hip to feel better.

## 2012-10-05 NOTE — Progress Notes (Signed)
S: This 66 y.o. Cauc female is here for pre-operative clearance for TKR  With Dr. Eulah Pont. She is not sure when she will have the surgery but will call the office to discuss a date. Pt has HTN for which she takes medication; she denies any side effects, diaphoresis, fatigue, fever, CP or tightness, palpitations, SOB or cough, edema, dizziness or syncope. She had an ECG in Feb 2013; it was normal.  Today, she c/o acute onset of posterior right hip pain since last night. The "sore spot" kept her from resting well last PM. She has taken nothing for this pain and denies any acute injury. The pain does not radiate into lower leg; there is no numbness or weakness.  ROS: As per HPI.  O:  Filed Vitals:   10/05/12 0951  BP: 130/72  Pulse: 78  Temp: 98 F (36.7 C)  Resp: 16   GEN: In NAD: WN,WD. Displays some discomfort with ambulation. HEENT: [place]/AT; EOMI, conj/scl clear. NECK: Supple w/o LAN or TMG> COR: RRR w/o m/g/r/. No edema. LUNGS: CTA; no wheezes or rales. MS: Tender right upper SI joint. SLR+ at 60 degrees on right. NEURO: A&O x 3; CNs intact. Nonfocal.  A/P:  1. Preoperative clearance - no contraindications to surgery Letter to be faxed to [facility]- Dr. Eulah Pont  2. Posterior pain of right hip  ketorolac (TORADOL) 30 MG/ML injection 30 mg, methylPREDNISolone acetate (DEPO-MEDROL) injection 80 mg  Pt advised to get OTC Tylenol (generic) 500 mg and take 2 tabs 3x daily prn pain.

## 2012-11-25 NOTE — Progress Notes (Signed)
Note reviewed, and agree with documentation and plan.  

## 2012-11-30 ENCOUNTER — Telehealth: Payer: Self-pay

## 2012-11-30 DIAGNOSIS — K219 Gastro-esophageal reflux disease without esophagitis: Secondary | ICD-10-CM

## 2012-11-30 NOTE — Telephone Encounter (Signed)
I will place an order for GI referral for eval for GERD. Please let pt know. Thanks.

## 2012-11-30 NOTE — Telephone Encounter (Signed)
Pt is wanting to talk with dr Audria Nine about getting a referral for acid reflux  Please call [phone]

## 2012-11-30 NOTE — Telephone Encounter (Signed)
I called patient and she is taking nexium for long time. States 2 years of symptoms and wants referral, has not seen a GI doctor before. Pended the referral, please advise.

## 2012-12-03 NOTE — Telephone Encounter (Signed)
Patient advised a GI referral has been sent and she should get a call or letter stating when the appointment is.  Patient states understanding.

## 2012-12-05 ENCOUNTER — Other Ambulatory Visit: Payer: Self-pay | Admitting: Family Medicine

## 2012-12-09 ENCOUNTER — Encounter (HOSPITAL_COMMUNITY): Payer: Self-pay | Admitting: Emergency Medicine

## 2012-12-09 ENCOUNTER — Emergency Department (HOSPITAL_COMMUNITY)
Admission: EM | Admit: 2012-12-09 | Discharge: 2012-12-09 | Disposition: A | Discharge status: Home / Self Care | Payer: Medicare Other | Attending: Emergency Medicine | Admitting: Emergency Medicine

## 2012-12-09 DIAGNOSIS — Z8739 Personal history of other diseases of the musculoskeletal system and connective tissue: Secondary | ICD-10-CM | POA: Insufficient documentation

## 2012-12-09 DIAGNOSIS — Z8719 Personal history of other diseases of the digestive system: Secondary | ICD-10-CM | POA: Insufficient documentation

## 2012-12-09 DIAGNOSIS — Z79899 Other long term (current) drug therapy: Secondary | ICD-10-CM | POA: Insufficient documentation

## 2012-12-09 DIAGNOSIS — Z87448 Personal history of other diseases of urinary system: Secondary | ICD-10-CM | POA: Insufficient documentation

## 2012-12-09 DIAGNOSIS — J45909 Unspecified asthma, uncomplicated: Secondary | ICD-10-CM | POA: Insufficient documentation

## 2012-12-09 DIAGNOSIS — I10 Essential (primary) hypertension: Secondary | ICD-10-CM | POA: Insufficient documentation

## 2012-12-09 DIAGNOSIS — Z87891 Personal history of nicotine dependence: Secondary | ICD-10-CM | POA: Insufficient documentation

## 2012-12-09 DIAGNOSIS — R21 Rash and other nonspecific skin eruption: Secondary | ICD-10-CM

## 2012-12-09 DIAGNOSIS — K219 Gastro-esophageal reflux disease without esophagitis: Secondary | ICD-10-CM | POA: Insufficient documentation

## 2012-12-09 DIAGNOSIS — J449 Chronic obstructive pulmonary disease, unspecified: Secondary | ICD-10-CM | POA: Insufficient documentation

## 2012-12-09 DIAGNOSIS — Z8509 Personal history of malignant neoplasm of other digestive organs: Secondary | ICD-10-CM | POA: Insufficient documentation

## 2012-12-09 DIAGNOSIS — E785 Hyperlipidemia, unspecified: Secondary | ICD-10-CM | POA: Insufficient documentation

## 2012-12-09 DIAGNOSIS — J4489 Other specified chronic obstructive pulmonary disease: Secondary | ICD-10-CM | POA: Insufficient documentation

## 2012-12-09 MED ORDER — PREDNISONE 50 MG PO TABS: 50.0000 mg | ORAL_TABLET | Freq: Every day | ORAL | Status: DC

## 2012-12-09 MED ORDER — FAMOTIDINE 20 MG PO TABS
40.0000 mg | ORAL_TABLET | Freq: Once | ORAL | Status: AC
  Administered 2012-12-09: 40 mg via ORAL
  Filled 2012-12-09: qty 2

## 2012-12-09 MED ORDER — DEXAMETHASONE SODIUM PHOSPHATE 4 MG/ML IJ SOLN
8.0000 mg | Freq: Once | INTRAMUSCULAR | Status: AC
  Administered 2012-12-09: 8 mg via INTRAVENOUS
  Filled 2012-12-09: qty 2

## 2012-12-09 MED ORDER — FAMOTIDINE 20 MG PO TABS: 20.0000 mg | ORAL_TABLET | Freq: Two times a day (BID) | ORAL | Status: DC

## 2012-12-09 NOTE — ED Notes (Signed)
Pt c/o red areas/hives/rash to areas x 2 to L leg and behind R knee. Pt c/o pain and redness to L ear. Pt c/o nonproductive cough x 1 week. A & O. PWD

## 2012-12-09 NOTE — ED Provider Notes (Signed)
History     CSN: [id number]  Arrival date & time 12/09/12  0159   First MD Initiated Contact with Patient 12/09/12 [phone]      Chief Complaint  Patient presents with  . Rash    (Consider location/radiation/quality/duration/timing/severity/associated sxs/prior treatment) HPI Patient presents emergency department with hives-like rash to her left anterior tab posterior knee began earlier today.  Patient, states she didn't take anything prior to arrival, for her rash.  Patient, states that her left ear feels, itchy.  Patient denies wheezing, shortness of breath, chest pain, fever, cough, runny nose, sore throat, or dizziness.  The patient, states she's not tried any new soaps, lotions, detergents chemicals or medications.  Patient denies any new exposures to foods Past Medical History  Diagnosis Date  . Hypertension   . Hyperlipemia   . Headache     migraine  . GERD (gastroesophageal reflux disease)   . Rectal bleeding   . Arthritis   . Blood transfusion   . COPD (chronic obstructive pulmonary disease)   . Weight loss   . Nasal congestion   . Wheezing   . Shortness of breath   . Asthma   . Lipoma     on abd  . Hemorrhoids   . Urinary frequency     Past Surgical History  Procedure Date  . Heart surgery at 71yr     age 66 -flow through heart not normal-per pt  . Appendectomy   . Colonoscopy   . Left ovary and fallopian tube removed   . Colonoscopy 12/22/2011    Procedure: COLONOSCOPY;  Surgeon: Shirley Friar, MD;  Location: [facility];  Service: Endoscopy;  Laterality: N/A;  . Ovarian tumor   . Breast tumor     left  . Knee arthroscopy     left x2  . Lipoma excision 02/15/2012    Procedure: EXCISION LIPOMA;  Surgeon: Clovis Pu. Cornett, MD;  Location: [facility];  Service: General;  Laterality: Left;  excision lipoma Left Lower abdomen  . Breast surgery     Family History  Problem Relation Age of Onset  . Colon cancer Father   . Cancer Father     colon  . Heart disease Father   . Malignant hyperthermia Neg Hx   . Heart disease Paternal Uncle     History  Substance Use Topics  . Smoking status: Former Smoker -- 2.0 packs/day for 35 years    Types: Cigarettes    Quit date: 08/23/2011  . Smokeless tobacco: Never Used  . Alcohol Use: No    OB History    Grav Para Term Preterm Abortions TAB SAB Ect Mult Living                  Review of Systems All other systems negative except as documented in the HPI. All pertinent positives and negatives as reviewed in the HPI.  Allergies  Sulfa antibiotics; Amoxicillin; Azithromycin; Clarithromycin; Penicillins; and Propoxyphene-acetaminophen  Home Medications   Current Outpatient Rx  Name  Route  Sig  Dispense  Refill  . ALBUTEROL SULFATE HFA 108 (90 BASE) MCG/ACT IN AERS   Inhalation   Inhale 2 puffs into the lungs every 6 (six) hours as needed. Shortness of breath         . BECLOMETHASONE DIPROPIONATE 80 MCG/ACT IN AERS   Inhalation   Inhale 1 puff into the lungs 2 (two) times daily. Rinse mouth after use.         Marland Kitchen  CETIRIZINE HCL 10 MG PO TABS   Oral   Take 10 mg by mouth daily.         Marland Kitchen ESOMEPRAZOLE MAGNESIUM 40 MG PO CPDR   Oral   Take 40 mg by mouth daily before breakfast.         . LISINOPRIL 10 MG PO TABS   Oral   Take 10 mg by mouth daily.         Marland Kitchen SIMVASTATIN 10 MG PO TABS   Oral   Take 10 mg by mouth at bedtime.         . TOPIRAMATE 50 MG PO TABS   Oral   Take 50 mg by mouth 2 (two) times daily as needed. For headache pain           BP 108/74  Pulse 78  Temp 98.3 F (36.8 C) (Oral)  Resp 18  Ht 5\' 3"  (1.6 m)  Wt 180 lb (81.647 kg)  BMI 31.89 kg/m2  SpO2 96%  Physical Exam  Constitutional: She is oriented to person, place, and time. She appears well-developed and well-nourished. No distress.  HENT:  Head: Normocephalic and atraumatic.  Mouth/Throat: Oropharynx is clear and moist.  Cardiovascular: Normal rate, regular rhythm  and normal heart sounds.  Exam reveals no gallop and no friction rub.   No murmur heard. Pulmonary/Chest: Effort normal and breath sounds normal. She has no wheezes.  Neurological: She is alert and oriented to person, place, and time.  Skin: Skin is warm and dry. Rash noted.       ED Course  Procedures (including critical care time)   Patient, we be treated for hives, due to unknown source.  Patient, is advised return here for any worsening in her condition.  Patient advised followup with her primary care Dr. for recheck  MDM         Carlyle Dolly, PA-C 12/09/12 [phone]

## 2012-12-09 NOTE — ED Provider Notes (Signed)
Medical screening examination/treatment/procedure(s) were performed by non-physician practitioner and as supervising physician I was immediately available for consultation/collaboration.   Lyanne Co, MD 12/09/12 [phone]

## 2012-12-31 ENCOUNTER — Ambulatory Visit (INDEPENDENT_AMBULATORY_CARE_PROVIDER_SITE_OTHER): Payer: Medicare Other | Admitting: Family Medicine

## 2012-12-31 VITALS — BP 157/88 | HR 82 | Temp 98.0°F | Resp 20 | Ht 62.0 in | Wt 195.8 lb

## 2012-12-31 DIAGNOSIS — R350 Frequency of micturition: Secondary | ICD-10-CM

## 2012-12-31 DIAGNOSIS — J441 Chronic obstructive pulmonary disease with (acute) exacerbation: Secondary | ICD-10-CM

## 2012-12-31 LAB — POCT INFLUENZA A/B
Influenza A, POC: NEGATIVE
Influenza B, POC: NEGATIVE

## 2012-12-31 LAB — POCT CBC
Granulocyte percent: 57.4 %G (ref 37–80)
HCT, POC: 41.3 % (ref 37.7–47.9)
Hemoglobin: 12.7 g/dL (ref 12.2–16.2)
Lymph, poc: 3.1 (ref 0.6–3.4)
MCH, POC: 27.6 pg (ref 27–31.2)
MCHC: 30.8 g/dL — AB (ref 31.8–35.4)
MCV: 89.8 fL (ref 80–97)
MID (cbc): 0.5 (ref 0–0.9)
MPV: 8.3 fL (ref 0–99.8)
POC Granulocyte: 4.8 (ref 2–6.9)
POC LYMPH PERCENT: 37 %L (ref 10–50)
POC MID %: 5.6 %M (ref 0–12)
Platelet Count, POC: 295 10*3/uL (ref 142–424)
RBC: 4.6 M/uL (ref 4.04–5.48)
RDW, POC: 15.6 %
WBC: 8.3 10*3/uL (ref 4.6–10.2)

## 2012-12-31 LAB — POCT URINALYSIS DIPSTICK
Bilirubin, UA: NEGATIVE
Glucose, UA: NEGATIVE
Ketones, UA: NEGATIVE
Leukocytes, UA: NEGATIVE
Nitrite, UA: NEGATIVE
Protein, UA: NEGATIVE
Spec Grav, UA: 1.01
Urobilinogen, UA: 0.2
pH, UA: 5.5

## 2012-12-31 LAB — POCT UA - MICROSCOPIC ONLY
Bacteria, U Microscopic: NEGATIVE
Casts, Ur, LPF, POC: NEGATIVE
Crystals, Ur, HPF, POC: NEGATIVE
Mucus, UA: NEGATIVE
Yeast, UA: NEGATIVE

## 2012-12-31 MED ORDER — DOXYCYCLINE HYCLATE 100 MG PO TABS: 100.0000 mg | ORAL_TABLET | Freq: Two times a day (BID) | ORAL | Status: DC

## 2012-12-31 MED ORDER — PREDNISONE 50 MG PO TABS: 50.0000 mg | ORAL_TABLET | Freq: Every day | ORAL | Status: DC

## 2012-12-31 MED ORDER — IPRATROPIUM BROMIDE 0.02 % IN SOLN
0.5000 mg | Freq: Once | RESPIRATORY_TRACT | Status: AC
  Administered 2012-12-31: 0.5 mg via RESPIRATORY_TRACT

## 2012-12-31 MED ORDER — ALBUTEROL SULFATE (2.5 MG/3ML) 0.083% IN NEBU
2.5000 mg | INHALATION_SOLUTION | Freq: Once | RESPIRATORY_TRACT | Status: AC
  Administered 2012-12-31: 2.5 mg via RESPIRATORY_TRACT

## 2012-12-31 MED ORDER — METHYLPREDNISOLONE SODIUM SUCC 125 MG IJ SOLR
125.0000 mg | Freq: Once | INTRAMUSCULAR | Status: AC
  Administered 2012-12-31: 125 mg via INTRAMUSCULAR

## 2012-12-31 MED ORDER — DOXYCYCLINE HYCLATE 100 MG PO TABS
100.0000 mg | ORAL_TABLET | Freq: Once | ORAL | Status: AC
  Administered 2012-12-31: 100 mg via ORAL

## 2012-12-31 MED ORDER — TIOTROPIUM BROMIDE MONOHYDRATE 18 MCG IN CAPS: 18.0000 ug | ORAL_CAPSULE | Freq: Every day | RESPIRATORY_TRACT | Status: DC

## 2012-12-31 NOTE — Patient Instructions (Addendum)

## 2012-12-31 NOTE — Progress Notes (Signed)
Subjective:    Patient ID: Summer Jackson, female    DOB: November 14, 1946, 67 y.o.   MRN: [id number]  HPI  Wheezing and coughing a lot and left chest pain. Pt w/ h/o COPD.  Has been coughing for 4d but worse today and didn't have some heat at home today for sev hrs which is why she is worse today.  Using qvar. Not been using the albuterol.  Has had chills and sweats at night.  HA and nasal cong, wheeze, left ear pain, no pharyngitis.  Cough prod of mucous.  Has been having back and chest pain. Chest pain constently mild but not worse w/ coughing.  Some nausea.  nml bm.  Urinary freq.  Did get flu shot this yr.  Family members smoke in the home though pt has stopped.  Has been using incentive spirometer at home.  Past Medical History  Diagnosis Date  . Hypertension   . Hyperlipemia   . Headache     migraine  . GERD (gastroesophageal reflux disease)   . Rectal bleeding   . Arthritis   . Blood transfusion   . COPD (chronic obstructive pulmonary disease)   . Weight loss   . Nasal congestion   . Wheezing   . Shortness of breath   . Asthma   . Lipoma     on abd  . Hemorrhoids   . Urinary frequency    Current Outpatient Prescriptions on File Prior to Visit  Medication Sig Dispense Refill  . albuterol (PROVENTIL HFA;VENTOLIN HFA) 108 (90 BASE) MCG/ACT inhaler Inhale 2 puffs into the lungs every 6 (six) hours as needed. Shortness of breath      . beclomethasone (QVAR) 80 MCG/ACT inhaler Inhale 1 puff into the lungs 2 (two) times daily. Rinse mouth after use.      . cetirizine (ZYRTEC) 10 MG tablet Take 10 mg by mouth daily.      Marland Kitchen esomeprazole (NEXIUM) 40 MG capsule Take 40 mg by mouth daily before breakfast.      . famotidine (PEPCID) 20 MG tablet Take 1 tablet (20 mg total) by mouth 2 (two) times daily.  10 tablet  0  . lisinopril (PRINIVIL,ZESTRIL) 10 MG tablet Take 10 mg by mouth daily.      . simvastatin (ZOCOR) 10 MG tablet Take 10 mg by mouth at bedtime.      . topiramate (TOPAMAX)  50 MG tablet Take 50 mg by mouth 2 (two) times daily as needed. For headache pain      . tiotropium (SPIRIVA HANDIHALER) 18 MCG inhalation capsule Place 1 capsule (18 mcg total) into inhaler and inhale daily.  30 capsule  5   Allergies  Allergen Reactions  . Sulfa Antibiotics Swelling and Rash  . Amoxicillin     REACTION: headache  . Azithromycin     REACTION: reddness around mouth  . Clarithromycin     REACTION: skin rash with minimal vesicular formation on arms and hands  . Penicillins     REACTION: headache  . Propoxyphene-Acetaminophen     REACTION: mouth sores     Review of Systems  Constitutional: Positive for chills, diaphoresis, activity change, appetite change and fatigue. Negative for fever.  HENT: Positive for ear pain, congestion and rhinorrhea. Negative for sore throat.   Respiratory: Positive for cough, chest tightness, shortness of breath and wheezing.   Cardiovascular: Positive for chest pain. Negative for palpitations and leg swelling.  Gastrointestinal: Positive for nausea. Negative for vomiting, abdominal pain, diarrhea and  constipation.  Genitourinary: Positive for frequency. Negative for dysuria, urgency and decreased urine volume.  Musculoskeletal: Positive for back pain and arthralgias. Negative for gait problem.  Neurological: Positive for headaches.  Hematological: Negative for adenopathy.  Psychiatric/Behavioral: Positive for sleep disturbance.      BP 157/88  Pulse 82  Temp 98 F (36.7 C) (Oral)  Resp 20  Ht 5\' 2"  (1.575 m)  Wt 195 lb 12.8 oz (88.814 kg)  BMI 35.81 kg/m2  SpO2 96% Objective:   Physical Exam  Constitutional: She is oriented to person, place, and time. She appears well-developed and well-nourished. No distress.  HENT:  Head: Normocephalic and atraumatic.  Right Ear: External ear and ear canal normal. Tympanic membrane is retracted.  Left Ear: External ear and ear canal normal. Tympanic membrane is retracted.  Nose: Mucosal  edema and rhinorrhea present.  Mouth/Throat: Uvula is midline and mucous membranes are normal. Posterior oropharyngeal erythema present. No oropharyngeal exudate or posterior oropharyngeal edema.  Eyes: Conjunctivae normal are normal. Right eye exhibits no discharge. Left eye exhibits no discharge. No scleral icterus.  Neck: Neck supple. No thyromegaly present.  Cardiovascular: Normal rate, regular rhythm and normal heart sounds.   Pulmonary/Chest: Effort normal. No accessory muscle usage. No respiratory distress. She has decreased breath sounds. She has no wheezes. She has no rhonchi. She has no rales. She exhibits tenderness and bony tenderness.       Talks in complete sentences and walks through clinic w/o apparent dyspnea.  Musculoskeletal: She exhibits no edema and no tenderness.  Lymphadenopathy:       Head (right side): No submandibular, no preauricular and no posterior auricular adenopathy present.       Head (left side): No submandibular, no preauricular and no posterior auricular adenopathy present.    She has no cervical adenopathy.       Right: No supraclavicular adenopathy present.       Left: No supraclavicular adenopathy present.  Neurological: She is alert and oriented to person, place, and time.  Skin: Skin is warm and dry. She is not diaphoretic. No erythema.  Psychiatric: She has a normal mood and affect. Her behavior is normal.          Results for orders placed in visit on 12/31/12  POCT CBC      Component Value Range   WBC 8.3  4.6 - 10.2 K/uL   Lymph, poc 3.1  0.6 - 3.4   POC LYMPH PERCENT 37.0  10 - 50 %L   MID (cbc) 0.5  0 - 0.9   POC MID % 5.6  0 - 12 %M   POC Granulocyte 4.8  2 - 6.9   Granulocyte percent 57.4  37 - 80 %G   RBC 4.60  4.04 - 5.48 M/uL   Hemoglobin 12.7  12.2 - 16.2 g/dL   HCT, POC 16.1  09.6 - 47.9 %   MCV 89.8  80 - 97 fL   MCH, POC 27.6  27 - 31.2 pg   MCHC 30.8 (*) 31.8 - 35.4 g/dL   RDW, POC 04.5     Platelet Count, POC 295  142 -  424 K/uL   MPV 8.3  0 - 99.8 fL  POCT INFLUENZA A/B      Component Value Range   Influenza A, POC Negative     Influenza B, POC Negative    POCT UA - MICROSCOPIC ONLY      Component Value Range   WBC, Ur, HPF, POC  0-1     RBC, urine, microscopic 0-1     Bacteria, U Microscopic neg     Mucus, UA neg     Epithelial cells, urine per micros 0-1     Crystals, Ur, HPF, POC neg     Casts, Ur, LPF, POC neg     Yeast, UA neg    POCT URINALYSIS DIPSTICK      Component Value Range   Color, UA yellow     Clarity, UA clear     Glucose, UA neg     Bilirubin, UA neg     Ketones, UA neg     Spec Grav, UA 1.010     Blood, UA moderate     pH, UA 5.5     Protein, UA neg     Urobilinogen, UA 0.2     Nitrite, UA neg     Leukocytes, UA Negative      Assessment & Plan:  Copd exac -  Gave solumedrol 125mg  IM x 1 in office now along with doxycycline 100mg  po x 1 now.  Given albterol neb in office. Increase home albuterol use to qid while ill and tomorrow start doxy 100 bid x 7d and prednisone 50mg  po qd x 5d.  RTC immed or to ER if any worsening. She will f/u w/ me after illness resolves for spiromety, possibly stepping up maintanence care, and referral to pulmonary rehab.  Start spiriva.

## 2013-01-04 ENCOUNTER — Encounter (HOSPITAL_COMMUNITY): Payer: Self-pay | Admitting: Emergency Medicine

## 2013-01-04 ENCOUNTER — Emergency Department (HOSPITAL_COMMUNITY): Payer: Medicare Other

## 2013-01-04 ENCOUNTER — Emergency Department (HOSPITAL_COMMUNITY)
Admission: EM | Admit: 2013-01-04 | Discharge: 2013-01-05 | Disposition: A | Discharge status: Home / Self Care | Payer: Medicare Other | Attending: Emergency Medicine | Admitting: Emergency Medicine

## 2013-01-04 DIAGNOSIS — M129 Arthropathy, unspecified: Secondary | ICD-10-CM | POA: Insufficient documentation

## 2013-01-04 DIAGNOSIS — K219 Gastro-esophageal reflux disease without esophagitis: Secondary | ICD-10-CM | POA: Insufficient documentation

## 2013-01-04 DIAGNOSIS — I10 Essential (primary) hypertension: Secondary | ICD-10-CM | POA: Insufficient documentation

## 2013-01-04 DIAGNOSIS — R0789 Other chest pain: Secondary | ICD-10-CM | POA: Insufficient documentation

## 2013-01-04 DIAGNOSIS — Z79899 Other long term (current) drug therapy: Secondary | ICD-10-CM | POA: Insufficient documentation

## 2013-01-04 DIAGNOSIS — J45909 Unspecified asthma, uncomplicated: Secondary | ICD-10-CM | POA: Insufficient documentation

## 2013-01-04 DIAGNOSIS — E785 Hyperlipidemia, unspecified: Secondary | ICD-10-CM | POA: Insufficient documentation

## 2013-01-04 DIAGNOSIS — J449 Chronic obstructive pulmonary disease, unspecified: Secondary | ICD-10-CM

## 2013-01-04 DIAGNOSIS — Z87891 Personal history of nicotine dependence: Secondary | ICD-10-CM | POA: Insufficient documentation

## 2013-01-04 DIAGNOSIS — Z8679 Personal history of other diseases of the circulatory system: Secondary | ICD-10-CM | POA: Insufficient documentation

## 2013-01-04 DIAGNOSIS — Z872 Personal history of diseases of the skin and subcutaneous tissue: Secondary | ICD-10-CM | POA: Insufficient documentation

## 2013-01-04 DIAGNOSIS — J4489 Other specified chronic obstructive pulmonary disease: Secondary | ICD-10-CM | POA: Insufficient documentation

## 2013-01-04 DIAGNOSIS — Z8719 Personal history of other diseases of the digestive system: Secondary | ICD-10-CM | POA: Insufficient documentation

## 2013-01-04 LAB — BASIC METABOLIC PANEL
BUN: 20 mg/dL (ref 6–23)
CO2: 21 mEq/L (ref 19–32)
Calcium: 8.8 mg/dL (ref 8.4–10.5)
Chloride: 103 mEq/L (ref 96–112)
Creatinine, Ser: 0.65 mg/dL (ref 0.50–1.10)
GFR calc Af Amer: >90 mL/min (ref >90)
GFR calc non Af Amer: >90 mL/min (ref >90)
Glucose, Bld: 153 mg/dL — ABNORMAL HIGH (ref 70–99)
Potassium: 3.5 mEq/L (ref 3.5–5.1)
Sodium: 137 mEq/L (ref 135–145)

## 2013-01-04 LAB — CBC
HCT: 35.6 % — ABNORMAL LOW (ref 36.0–46.0)
Hemoglobin: 12 g/dL (ref 12.0–15.0)
MCH: 28.6 pg (ref 26.0–34.0)
MCHC: 33.7 g/dL (ref 30.0–36.0)
MCV: 85 fL (ref 78.0–100.0)
Platelets: 284 10*3/uL (ref 150–400)
RBC: 4.19 MIL/uL (ref 3.87–5.11)
RDW: 14.3 % (ref 11.5–15.5)
WBC: 12 10*3/uL — ABNORMAL HIGH (ref 4.0–10.5)

## 2013-01-04 LAB — TROPONIN I: Troponin I: <0.3 ng/mL (ref <0.30)

## 2013-01-04 MED ORDER — IPRATROPIUM BROMIDE 0.02 % IN SOLN
0.5000 mg | Freq: Once | RESPIRATORY_TRACT | Status: AC
  Administered 2013-01-04: 0.5 mg via RESPIRATORY_TRACT
  Filled 2013-01-04: qty 2.5

## 2013-01-04 MED ORDER — ALBUTEROL SULFATE (5 MG/ML) 0.5% IN NEBU
5.0000 mg | INHALATION_SOLUTION | Freq: Once | RESPIRATORY_TRACT | Status: AC
  Administered 2013-01-04: 5 mg via RESPIRATORY_TRACT
  Filled 2013-01-04: qty 0.5

## 2013-01-04 NOTE — ED Notes (Signed)
Patient states she has been wheezing, lung sounds CTA. Patient reports being a former smoker, quitting late 2012. Patient also requesting outpatient GI consult, she feels as though her reflux medications are ineffective and that she requires surgery.

## 2013-01-04 NOTE — ED Notes (Signed)
EKG printed and given to EDP Delo for review. Last confirmed EKG printed for comparsion

## 2013-01-04 NOTE — ED Provider Notes (Signed)
History     CSN: [id number]  Arrival date & time 01/04/13  1944   First MD Initiated Contact with Patient 01/04/13 2151      Chief Complaint  Patient presents with  . Chest Pain   HPI  History provided by the patient. Patient is a 67 year old female with history of hypertension, hyperlipidemia, COPD and asthma who presents with complaints of persistent ongoing asthma and COPD exacerbation for the past week or more now with continued chest pains. Patient states she is having pain and soreness across her upper bilateral chest is worse with palpation. Patient states this has been sore for the past several days. She was seen in urgent care Center on Tuesday 3 days ago for these symptoms. She was given prescriptions for prednisone and hydrocodone. Patient was taking hydrocodone with some relief of symptoms but has since run out and continues to have pain. Patient has also been using increased albuterol for COPD symptoms without significant improvements. Symptoms have not been associated with any heart palpitations, nausea or diaphoresis. Patient has had symptoms of nasal congestion, sinus pressure and headache. She denies any other aggravating or alleviating factors.     Past Medical History  Diagnosis Date  . Hypertension   . Hyperlipemia   . Headache     migraine  . GERD (gastroesophageal reflux disease)   . Rectal bleeding   . Arthritis   . Blood transfusion   . COPD (chronic obstructive pulmonary disease)   . Weight loss   . Nasal congestion   . Wheezing   . Shortness of breath   . Asthma   . Lipoma     on abd  . Hemorrhoids   . Urinary frequency     Past Surgical History  Procedure Date  . Heart surgery at 42yr     age 69 -flow through heart not normal-per pt  . Appendectomy   . Colonoscopy   . Left ovary and fallopian tube removed   . Colonoscopy 12/22/2011    Procedure: COLONOSCOPY;  Surgeon: Shirley Friar, MD;  Location: [facility];  Service: Endoscopy;   Laterality: N/A;  . Ovarian tumor   . Breast tumor     left  . Knee arthroscopy     left x2  . Lipoma excision 02/15/2012    Procedure: EXCISION LIPOMA;  Surgeon: Clovis Pu. Cornett, MD;  Location: [facility];  Service: General;  Laterality: Left;  excision lipoma Left Lower abdomen  . Breast surgery     Family History  Problem Relation Age of Onset  . Colon cancer Father   . Cancer Father     colon  . Heart disease Father   . Malignant hyperthermia Neg Hx   . Heart disease Paternal Uncle     History  Substance Use Topics  . Smoking status: Former Smoker -- 2.0 packs/day for 35 years    Types: Cigarettes    Quit date: 08/23/2011  . Smokeless tobacco: Never Used  . Alcohol Use: No    OB History    Grav Para Term Preterm Abortions TAB SAB Ect Mult Living                  Review of Systems  Constitutional: Negative for fever, chills and diaphoresis.  HENT: Positive for congestion and sinus pressure.   Respiratory: Positive for cough, shortness of breath and wheezing.   Cardiovascular: Positive for chest pain. Negative for palpitations.  Gastrointestinal: Negative for nausea, vomiting, abdominal pain  and diarrhea.  Neurological: Positive for headaches.  All other systems reviewed and are negative.    Allergies  Sulfa antibiotics; Amoxicillin; Azithromycin; Clarithromycin; Penicillins; and Propoxyphene-acetaminophen  Home Medications   Current Outpatient Rx  Name  Route  Sig  Dispense  Refill  . ALBUTEROL SULFATE HFA 108 (90 BASE) MCG/ACT IN AERS   Inhalation   Inhale 2 puffs into the lungs every 6 (six) hours as needed. Shortness of breath         . BECLOMETHASONE DIPROPIONATE 80 MCG/ACT IN AERS   Inhalation   Inhale 1 puff into the lungs 2 (two) times daily. Rinse mouth after use.         Marland Kitchen CETIRIZINE HCL 10 MG PO TABS   Oral   Take 10 mg by mouth daily.         Marland Kitchen DOXYCYCLINE HYCLATE 100 MG PO TABS   Oral   Take 1 tablet (100 mg  total) by mouth 2 (two) times daily.   14 tablet   0   . ESOMEPRAZOLE MAGNESIUM 40 MG PO CPDR   Oral   Take 40 mg by mouth daily before breakfast.         . FAMOTIDINE 20 MG PO TABS   Oral   Take 20 mg by mouth 2 (two) times daily.         Marland Kitchen LISINOPRIL 10 MG PO TABS   Oral   Take 10 mg by mouth daily.         Marland Kitchen PREDNISONE 50 MG PO TABS   Oral   Take 50 mg by mouth daily.         Marland Kitchen SIMVASTATIN 10 MG PO TABS   Oral   Take 10 mg by mouth at bedtime.         Marland Kitchen TIOTROPIUM BROMIDE MONOHYDRATE 18 MCG IN CAPS   Inhalation   Place 18 mcg into inhaler and inhale daily.         . TOPIRAMATE 50 MG PO TABS   Oral   Take 50 mg by mouth 2 (two) times daily as needed. For headache pain           BP 148/74  Pulse 87  Temp 98.1 F (36.7 C) (Oral)  Resp 15  SpO2 97%  Physical Exam  Nursing note and vitals reviewed. Constitutional: She is oriented to person, place, and time. She appears well-developed and well-nourished. No distress.  HENT:  Head: Normocephalic.  Cardiovascular: Normal rate and regular rhythm.   Pulmonary/Chest: Effort normal. No respiratory distress. She has wheezes. She has no rales. She exhibits no tenderness.  Abdominal: Soft. There is no tenderness.  Neurological: She is alert and oriented to person, place, and time.  Skin: Skin is warm and dry. No rash noted.  Psychiatric: She has a normal mood and affect. Her behavior is normal.    ED Course  Procedures   Results for orders placed during the hospital encounter of 01/04/13  CBC      Component Value Range   WBC 12.0 (*) 4.0 - 10.5 K/uL   RBC 4.19  3.87 - 5.11 MIL/uL   Hemoglobin 12.0  12.0 - 15.0 g/dL   HCT 40.9 (*) 81.1 - 91.4 %   MCV 85.0  78.0 - 100.0 fL   MCH 28.6  26.0 - 34.0 pg   MCHC 33.7  30.0 - 36.0 g/dL   RDW 78.2  95.6 - 21.3 %   Platelets 284  150 - 400  K/uL  BASIC METABOLIC PANEL      Component Value Range   Sodium 137  135 - 145 mEq/L   Potassium 3.5  3.5 - 5.1  mEq/L   Chloride 103  96 - 112 mEq/L   CO2 21  19 - 32 mEq/L   Glucose, Bld 153 (*) 70 - 99 mg/dL   BUN 20  6 - 23 mg/dL   Creatinine, Ser 1.19  0.50 - 1.10 mg/dL   Calcium 8.8  8.4 - 14.7 mg/dL   GFR calc non Af Amer >90  >90 mL/min   GFR calc Af Amer >90  >90 mL/min  TROPONIN I      Component Value Range   Troponin I <0.30  <0.30 ng/mL        Dg Chest 2 View  01/04/2013  *RADIOLOGY REPORT*  Clinical Data: Cough and left-sided chest pain  CHEST - 2 VIEW  Comparison: 09/24/2013and 04/05/2010  Findings: The heart, mediastinal, and hilar contours are normal. Slight elevation of the left hemidiaphragm.  Lungs are clear. Negative for pneumothorax or pleural effusion.  Shortening of the left distal clavicle is chronic and may reflect postsurgical changes or prior osteolysis. No acute bony abnormality identified.  IMPRESSION: No acute cardiopulmonary disease.   Original Report Authenticated By: Britta Mccreedy, M.D.      1. Musculoskeletal chest pain   2. COPD (chronic obstructive pulmonary disease)       MDM  10:00PM patient seen and evaluated. Patient appears comfortable in no acute distress. Normal respirations and O2 sats. Patient does have significant wheezing on exam. Pain is reproducible with palpation. Patient has had significant increase COPD and asthma symptoms over the past several days during which symptoms have been constant. Symptoms atypical for ACS.  Patient's lab tests, EKG and chest x-ray normal. She is feeling better after breathing treatment. At this time we'll discharge home and provides small prescription for additional hydrocodone. Patient will followup with PCP.     Date: 01/04/2013  Rate: 81  Rhythm: normal sinus rhythm  QRS Axis: normal  Intervals: normal  ST/T Wave abnormalities: normal  Conduction Disutrbances:none  Narrative Interpretation:   Old EKG Reviewed: unchanged          Angus Seller, PA 01/05/13 [phone]

## 2013-01-04 NOTE — ED Notes (Signed)
Patient states that she has had pain to her left chest x 1 week. Was at the urgent care and they did not do anything for her per the patient. The patient reports that "i may need a breathing treatment adn breathing machine to go home with. This is the worst that I have ever had it with"

## 2013-01-04 NOTE — ED Notes (Signed)
Patient c/o chest pain 10/10 x1 week, + for cough, productive with tan sputum. Patient also reports Hx of asthma and COPD. Was seen at an urgent care provider on Tuesday, was given a breathing treatment, a shot, and one pill. Patient was sent home with 3 Rx, prednisone, spiriva, and a 3rd medication she is unable to name, patient reports she began using them on Wednesday. Patient presents today with continued chest pain and breathing difficulties. Patient VSS on monitor. Patient was told she needs to change her diet and increase her water intake to help break secretions. Patient placed on telemetry monitor with continuous SPO2 monitoring.

## 2013-01-05 MED ORDER — HYDROCODONE-ACETAMINOPHEN 5-325 MG PO TABS: 1.0000 | ORAL_TABLET | ORAL | Status: DC | PRN

## 2013-01-05 NOTE — ED Provider Notes (Signed)
Medical screening examination/treatment/procedure(s) were performed by non-physician practitioner and as supervising physician I was immediately available for consultation/collaboration.  Courtny Bennison, MD 01/05/13 0942 

## 2013-01-07 ENCOUNTER — Other Ambulatory Visit: Payer: Self-pay | Admitting: Family Medicine

## 2013-01-08 ENCOUNTER — Ambulatory Visit: Payer: Medicare Other | Admitting: Family Medicine

## 2013-01-10 ENCOUNTER — Ambulatory Visit (INDEPENDENT_AMBULATORY_CARE_PROVIDER_SITE_OTHER): Payer: Medicare Other | Admitting: Family Medicine

## 2013-01-10 VITALS — BP 111/73 | HR 94 | Temp 98.3°F | Resp 18 | Ht 62.0 in | Wt 193.0 lb

## 2013-01-10 DIAGNOSIS — R739 Hyperglycemia, unspecified: Secondary | ICD-10-CM

## 2013-01-10 DIAGNOSIS — J449 Chronic obstructive pulmonary disease, unspecified: Secondary | ICD-10-CM

## 2013-01-10 DIAGNOSIS — R7303 Prediabetes: Secondary | ICD-10-CM

## 2013-01-10 DIAGNOSIS — E785 Hyperlipidemia, unspecified: Secondary | ICD-10-CM

## 2013-01-10 DIAGNOSIS — K219 Gastro-esophageal reflux disease without esophagitis: Secondary | ICD-10-CM

## 2013-01-10 DIAGNOSIS — R7309 Other abnormal glucose: Secondary | ICD-10-CM

## 2013-01-10 DIAGNOSIS — E669 Obesity, unspecified: Secondary | ICD-10-CM

## 2013-01-10 HISTORY — DX: Prediabetes: R73.03

## 2013-01-10 LAB — POCT CBC
Granulocyte percent: 64.7 %G (ref 37–80)
HCT, POC: 40.4 % (ref 37.7–47.9)
Hemoglobin: 12.4 g/dL (ref 12.2–16.2)
Lymph, poc: 3.1 (ref 0.6–3.4)
MCH, POC: 27.7 pg (ref 27–31.2)
MCHC: 30.7 g/dL — AB (ref 31.8–35.4)
MCV: 90.4 fL (ref 80–97)
MID (cbc): 0.8 (ref 0–0.9)
MPV: 8.5 fL (ref 0–99.8)
POC Granulocyte: 7.2 — AB (ref 2–6.9)
POC LYMPH PERCENT: 28.1 %L (ref 10–50)
POC MID %: 7.2 %M (ref 0–12)
Platelet Count, POC: 339 10*3/uL (ref 142–424)
RBC: 4.47 M/uL (ref 4.04–5.48)
RDW, POC: 16 %
WBC: 11.2 10*3/uL — AB (ref 4.6–10.2)

## 2013-01-10 LAB — PULMONARY FUNCTION TEST

## 2013-01-10 LAB — POCT GLYCOSYLATED HEMOGLOBIN (HGB A1C): Hemoglobin A1C: 6.1

## 2013-01-10 MED ORDER — FAMOTIDINE 20 MG PO TABS: 20.0000 mg | ORAL_TABLET | Freq: Two times a day (BID) | ORAL | Status: DC | PRN

## 2013-01-10 MED ORDER — ALBUTEROL SULFATE (2.5 MG/3ML) 0.083% IN NEBU: 2.5000 mg | INHALATION_SOLUTION | Freq: Once | RESPIRATORY_TRACT | Status: DC

## 2013-01-10 MED ORDER — DEXLANSOPRAZOLE 60 MG PO CPDR: 60.0000 mg | DELAYED_RELEASE_CAPSULE | Freq: Every day | ORAL | Status: DC

## 2013-01-10 NOTE — Progress Notes (Signed)
Subjective:    Patient ID: Summer Jackson, female    DOB: 1946/10/15, 67 y.o.   MRN: [id number] Chief Complaint  Patient presents with  . COPD    recheck    HPI  Breathing is somewhat better.  Started using Spiriva daily and tolerating it.  Is using albuterol inhaler 2 - 3 times daily and using qvar twice a day.  Finished prednisone and doxycycline and hydrocodone.  Breathing is not yet back to her baseline - still wheezing, coughing improved. Not coughing anything up.  No longer feeling ill.  Chest pain is improved.  Nexium isn't workinbg for her GERD anymore - is having constant heartburn.  Wonders if she needs surgery.  Saw GI doctor - Dr. Claretha Cooper previously - and told she might eventually need surgery.  Past Medical History  Diagnosis Date  . Hypertension   . Hyperlipemia   . Headache     migraine  . GERD (gastroesophageal reflux disease)   . Rectal bleeding   . Arthritis   . Blood transfusion   . COPD (chronic obstructive pulmonary disease)   . Weight loss   . Nasal congestion   . Wheezing   . Shortness of breath   . Asthma   . Lipoma     on abd  . Hemorrhoids   . Urinary frequency    Current Outpatient Prescriptions on File Prior to Visit  Medication Sig Dispense Refill  . albuterol (PROVENTIL HFA;VENTOLIN HFA) 108 (90 BASE) MCG/ACT inhaler Inhale 2 puffs into the lungs every 6 (six) hours as needed. Shortness of breath      . beclomethasone (QVAR) 80 MCG/ACT inhaler Inhale 1 puff into the lungs 2 (two) times daily. Rinse mouth after use.      . cetirizine (ZYRTEC) 10 MG tablet Take 10 mg by mouth daily.      Marland Kitchen lisinopril (PRINIVIL,ZESTRIL) 10 MG tablet Take 10 mg by mouth daily.      . simvastatin (ZOCOR) 10 MG tablet Take 10 mg by mouth at bedtime.      Marland Kitchen tiotropium (SPIRIVA) 18 MCG inhalation capsule Place 18 mcg into inhaler and inhale daily.      Marland Kitchen topiramate (TOPAMAX) 50 MG tablet Take 50 mg by mouth 2 (two) times daily as needed. For headache pain      .  dexlansoprazole (DEXILANT) 60 MG capsule Take 1 capsule (60 mg total) by mouth daily.  30 capsule  2  . doxycycline (VIBRA-TABS) 100 MG tablet Take 1 tablet (100 mg total) by mouth 2 (two) times daily.  14 tablet  0  . HYDROcodone-acetaminophen (NORCO) 5-325 MG per tablet Take 1 tablet by mouth every 4 (four) hours as needed for pain.  10 tablet  0  . predniSONE (DELTASONE) 50 MG tablet Take 50 mg by mouth daily.       No current facility-administered medications on file prior to visit.   Allergies  Allergen Reactions  . Sulfa Antibiotics Swelling and Rash  . Amoxicillin     REACTION: headache  . Azithromycin     REACTION: reddness around mouth  . Clarithromycin     REACTION: skin rash with minimal vesicular formation on arms and hands  . Penicillins     REACTION: headache  . Propoxyphene-Acetaminophen     REACTION: mouth sores     Review of Systems  Constitutional: Positive for fatigue. Negative for fever, chills, diaphoresis, activity change, appetite change and unexpected weight change.  HENT: Positive for sore throat, voice change  and postnasal drip. Negative for congestion and rhinorrhea.   Respiratory: Positive for cough, shortness of breath and wheezing. Negative for chest tightness.   Cardiovascular: Negative for chest pain, palpitations and leg swelling.  Gastrointestinal: Positive for abdominal pain.  Musculoskeletal: Negative for back pain and arthralgias.  Hematological: Negative for adenopathy.  Psychiatric/Behavioral: Negative for sleep disturbance.     BP 111/73  Pulse 94  Temp 98.3 F (36.8 C) (Oral)  Resp 18  Ht 5\' 2"  (1.575 m)  Wt 193 lb (87.544 kg)  BMI 35.30 kg/m2  SpO2 96%    Objective:   Physical Exam  Constitutional: She is oriented to person, place, and time. She appears well-developed and well-nourished. No distress.  HENT:  Head: Normocephalic and atraumatic.  Right Ear: External ear normal.  Left Ear: External ear normal.  Eyes:  Conjunctivae normal are normal. No scleral icterus.  Neck: Normal range of motion. Neck supple. No thyromegaly present.  Cardiovascular: Normal rate, regular rhythm, normal heart sounds and intact distal pulses.   Pulmonary/Chest: Effort normal. No respiratory distress. She has decreased breath sounds. She has no wheezes. She has no rhonchi.  Musculoskeletal: She exhibits no edema.  Lymphadenopathy:    She has no cervical adenopathy.  Neurological: She is alert and oriented to person, place, and time.  Skin: Skin is warm and dry. She is not diaphoretic. No erythema.  Psychiatric: She has a normal mood and affect. Her behavior is normal.          Results for orders placed in visit on 01/10/13  POCT CBC      Component Value Range   WBC 11.2 (*) 4.6 - 10.2 K/uL   Lymph, poc 3.1  0.6 - 3.4   POC LYMPH PERCENT 28.1  10 - 50 %L   MID (cbc) 0.8  0 - 0.9   POC MID % 7.2  0 - 12 %M   POC Granulocyte 7.2 (*) 2 - 6.9   Granulocyte percent 64.7  37 - 80 %G   RBC 4.47  4.04 - 5.48 M/uL   Hemoglobin 12.4  12.2 - 16.2 g/dL   HCT, POC 16.1  09.6 - 47.9 %   MCV 90.4  80 - 97 fL   MCH, POC 27.7  27 - 31.2 pg   MCHC 30.7 (*) 31.8 - 35.4 g/dL   RDW, POC 04.5     Platelet Count, POC 339  142 - 424 K/uL   MPV 8.5  0 - 99.8 fL  POCT GLYCOSYLATED HEMOGLOBIN (HGB A1C)      Component Value Range   Hemoglobin A1C 6.1     Office Spirometry Results: FEV1: 1.74 liters FVC: 2.25 liters FEV1/FVC: 77.3 % FVC  % Predicted: 77 liters FEV % Predicted: 79 liters FeF 25-75: 1.54 liters FeF 25-75 % Predicted: 61   Assessment & Plan:  GERD - Switch to dexilanty from nexium and try adding in prn famotidine. Refer to GI to consider endoscopy. H. Pylori P. HPL - needs flp at f/u. HTN - well controlled COPD/asthma - refer to Pulm for further eval and recommend starting pulmonary rehab.  Spirometry results unclear - FEV1/FVC 0.77 and 94% of predictive - more consistent with restrictive disease with no  significant improvement after albuterol neb so unsure about stage of COPD. Cont spiriva, qvar, and prn albuterol.

## 2013-01-12 ENCOUNTER — Encounter (HOSPITAL_COMMUNITY): Payer: Self-pay | Admitting: *Deleted

## 2013-01-12 ENCOUNTER — Emergency Department (HOSPITAL_COMMUNITY)
Admission: EM | Admit: 2013-01-12 | Discharge: 2013-01-13 | Disposition: A | Discharge status: Home / Self Care | Payer: Medicare Other | Attending: Emergency Medicine | Admitting: Emergency Medicine

## 2013-01-12 DIAGNOSIS — Z79899 Other long term (current) drug therapy: Secondary | ICD-10-CM | POA: Insufficient documentation

## 2013-01-12 DIAGNOSIS — Z87448 Personal history of other diseases of urinary system: Secondary | ICD-10-CM | POA: Insufficient documentation

## 2013-01-12 DIAGNOSIS — T360X5A Adverse effect of penicillins, initial encounter: Secondary | ICD-10-CM | POA: Insufficient documentation

## 2013-01-12 DIAGNOSIS — R51 Headache: Secondary | ICD-10-CM | POA: Insufficient documentation

## 2013-01-12 DIAGNOSIS — J449 Chronic obstructive pulmonary disease, unspecified: Secondary | ICD-10-CM | POA: Insufficient documentation

## 2013-01-12 DIAGNOSIS — IMO0002 Reserved for concepts with insufficient information to code with codable children: Secondary | ICD-10-CM | POA: Insufficient documentation

## 2013-01-12 DIAGNOSIS — Z8679 Personal history of other diseases of the circulatory system: Secondary | ICD-10-CM | POA: Insufficient documentation

## 2013-01-12 DIAGNOSIS — Z8709 Personal history of other diseases of the respiratory system: Secondary | ICD-10-CM | POA: Insufficient documentation

## 2013-01-12 DIAGNOSIS — J4489 Other specified chronic obstructive pulmonary disease: Secondary | ICD-10-CM | POA: Insufficient documentation

## 2013-01-12 DIAGNOSIS — I10 Essential (primary) hypertension: Secondary | ICD-10-CM | POA: Insufficient documentation

## 2013-01-12 DIAGNOSIS — J45909 Unspecified asthma, uncomplicated: Secondary | ICD-10-CM | POA: Insufficient documentation

## 2013-01-12 DIAGNOSIS — E785 Hyperlipidemia, unspecified: Secondary | ICD-10-CM | POA: Insufficient documentation

## 2013-01-12 DIAGNOSIS — Z8739 Personal history of other diseases of the musculoskeletal system and connective tissue: Secondary | ICD-10-CM | POA: Insufficient documentation

## 2013-01-12 DIAGNOSIS — Z8719 Personal history of other diseases of the digestive system: Secondary | ICD-10-CM | POA: Insufficient documentation

## 2013-01-12 DIAGNOSIS — L0390 Cellulitis, unspecified: Secondary | ICD-10-CM

## 2013-01-12 DIAGNOSIS — Z87891 Personal history of nicotine dependence: Secondary | ICD-10-CM | POA: Insufficient documentation

## 2013-01-12 DIAGNOSIS — K219 Gastro-esophageal reflux disease without esophagitis: Secondary | ICD-10-CM | POA: Insufficient documentation

## 2013-01-12 NOTE — ED Notes (Signed)
Pt states her left forearm started swelling and painful about 2 hours ago and it has continued to grow in diameter and feels hot to touch,  Pt doesn't think she was bit by anything

## 2013-01-13 LAB — CBC
HCT: 38.2 % (ref 36.0–46.0)
Hemoglobin: 12.8 g/dL (ref 12.0–15.0)
MCH: 29 pg (ref 26.0–34.0)
MCHC: 33.5 g/dL (ref 30.0–36.0)
MCV: 86.6 fL (ref 78.0–100.0)
Platelets: 254 10*3/uL (ref 150–400)
RBC: 4.41 MIL/uL (ref 3.87–5.11)
RDW: 14.3 % (ref 11.5–15.5)
WBC: 9.7 10*3/uL (ref 4.0–10.5)

## 2013-01-13 LAB — BASIC METABOLIC PANEL
BUN: 15 mg/dL (ref 6–23)
CO2: 23 mEq/L (ref 19–32)
Calcium: 8.5 mg/dL (ref 8.4–10.5)
Chloride: 98 mEq/L (ref 96–112)
Creatinine, Ser: 0.63 mg/dL (ref 0.50–1.10)
GFR calc Af Amer: >90 mL/min (ref >90)
GFR calc non Af Amer: >90 mL/min (ref >90)
Glucose, Bld: 106 mg/dL — ABNORMAL HIGH (ref 70–99)
Potassium: 3.6 mEq/L (ref 3.5–5.1)
Sodium: 133 mEq/L — ABNORMAL LOW (ref 135–145)

## 2013-01-13 MED ORDER — CEPHALEXIN 500 MG PO CAPS: 500.0000 mg | ORAL_CAPSULE | Freq: Four times a day (QID) | ORAL | Status: DC

## 2013-01-13 MED ORDER — VANCOMYCIN HCL IN DEXTROSE 1-5 GM/200ML-% IV SOLN
1000.0000 mg | Freq: Once | INTRAVENOUS | Status: AC
  Administered 2013-01-13: 1000 mg via INTRAVENOUS
  Filled 2013-01-13: qty 200

## 2013-01-13 NOTE — ED Notes (Signed)
[id number]<AV> Expected date:<BR> Expected time:<BR> Means of arrival:<BR> Comments:<BR> Hold for triage 1

## 2013-01-13 NOTE — ED Provider Notes (Signed)
History     CSN: [id number]  Arrival date & time 01/12/13  2326   First MD Initiated Contact with Patient 01/13/13 0015      Chief Complaint  Patient presents with  . Rash    left forearm    (Consider location/radiation/quality/duration/timing/severity/associated sxs/prior treatment) HPI Hx per PT - L FA rash started tonight is getting bigger with pain, selling and is hot to the touch. PT has multiple drug and antibiotic allergies - she states that Amoxicillin and PCN cause her to get a HA, otherwise she can take those.  No F/C, no N/V, does have a central area of abrasion on her L FA but uncertain how that occurred. MOD in severity.  Past Medical History  Diagnosis Date  . Hypertension   . Hyperlipemia   . Headache     migraine  . GERD (gastroesophageal reflux disease)   . Rectal bleeding   . Arthritis   . Blood transfusion   . COPD (chronic obstructive pulmonary disease)   . Weight loss   . Nasal congestion   . Wheezing   . Shortness of breath   . Asthma   . Lipoma     on abd  . Hemorrhoids   . Urinary frequency     Past Surgical History  Procedure Date  . Heart surgery at 50yr     age 39 -flow through heart not normal-per pt  . Appendectomy   . Colonoscopy   . Left ovary and fallopian tube removed   . Colonoscopy 12/22/2011    Procedure: COLONOSCOPY;  Surgeon: Shirley Friar, MD;  Location: [facility];  Service: Endoscopy;  Laterality: N/A;  . Ovarian tumor   . Breast tumor     left  . Knee arthroscopy     left x2  . Lipoma excision 02/15/2012    Procedure: EXCISION LIPOMA;  Surgeon: Clovis Pu. Cornett, MD;  Location: [facility];  Service: General;  Laterality: Left;  excision lipoma Left Lower abdomen  . Breast surgery     Family History  Problem Relation Age of Onset  . Colon cancer Father   . Cancer Father     colon  . Heart disease Father   . Malignant hyperthermia Neg Hx   . Heart disease Paternal Uncle     History    Substance Use Topics  . Smoking status: Former Smoker -- 2.0 packs/day for 35 years    Types: Cigarettes    Quit date: 08/23/2011  . Smokeless tobacco: Never Used  . Alcohol Use: No    OB History    Grav Para Term Preterm Abortions TAB SAB Ect Mult Living                  Review of Systems  Constitutional: Negative for fever and chills.  HENT: Negative for neck pain and neck stiffness.   Eyes: Negative for pain.  Respiratory: Negative for shortness of breath.   Cardiovascular: Negative for chest pain.  Gastrointestinal: Negative for abdominal pain.  Genitourinary: Negative for dysuria.  Musculoskeletal: Negative for back pain.  Skin: Positive for rash and wound.  Neurological: Negative for headaches.  All other systems reviewed and are negative.    Allergies  Sulfa antibiotics; Amoxicillin; Azithromycin; Penicillins; Propoxyphene-acetaminophen; and Clarithromycin  Home Medications   Current Outpatient Rx  Name  Route  Sig  Dispense  Refill  . ALBUTEROL SULFATE HFA 108 (90 BASE) MCG/ACT IN AERS   Inhalation   Inhale 2 puffs  into the lungs every 6 (six) hours as needed. Shortness of breath         . BECLOMETHASONE DIPROPIONATE 80 MCG/ACT IN AERS   Inhalation   Inhale 1 puff into the lungs 2 (two) times daily. Rinse mouth after use.         Marland Kitchen CETIRIZINE HCL 10 MG PO TABS   Oral   Take 10 mg by mouth every evening.          . DEXLANSOPRAZOLE 60 MG PO CPDR   Oral   Take 1 capsule (60 mg total) by mouth daily.   30 capsule   2   . FAMOTIDINE 20 MG PO TABS   Oral   Take 20 mg by mouth 2 (two) times daily as needed. For heartburn         . LISINOPRIL 10 MG PO TABS   Oral   Take 10 mg by mouth daily.         Marland Kitchen SIMVASTATIN 10 MG PO TABS   Oral   Take 10 mg by mouth at bedtime.         Marland Kitchen TIOTROPIUM BROMIDE MONOHYDRATE 18 MCG IN CAPS   Inhalation   Place 18 mcg into inhaler and inhale daily.         . TOPIRAMATE 50 MG PO TABS   Oral   Take  50 mg by mouth 2 (two) times daily as needed. For headache pain           BP 133/74  Pulse 79  Temp 98.2 F (36.8 C) (Oral)  Resp 20  Ht 5\' 2"  (1.575 m)  Wt 193 lb (87.544 kg)  BMI 35.30 kg/m2  SpO2 97%  Physical Exam  Constitutional: She is oriented to person, place, and time. She appears well-developed and well-nourished.  HENT:  Head: Normocephalic and atraumatic.  Eyes: EOM are normal. Pupils are equal, round, and reactive to light.  Neck: Neck supple.  Cardiovascular: Normal rate, regular rhythm and intact distal pulses.   Pulmonary/Chest: Effort normal and breath sounds normal. No respiratory distress.  Abdominal: Soft. Bowel sounds are normal. She exhibits no distension. There is no tenderness.  Musculoskeletal: Normal range of motion.       LUE: area of erythema, swelling and increased warmth to touch volar aspect of mid forearm.  Ink outlined. TTP. Distal N/V intact. No streaking lymphangitis.  Neurological: She is alert and oriented to person, place, and time.  Skin: Skin is warm and dry.    ED Course  Procedures (including critical care time)  Results for orders placed during the hospital encounter of 01/12/13  CBC      Component Value Range   WBC 9.7  4.0 - 10.5 K/uL   RBC 4.41  3.87 - 5.11 MIL/uL   Hemoglobin 12.8  12.0 - 15.0 g/dL   HCT 16.1  09.6 - 04.5 %   MCV 86.6  78.0 - 100.0 fL   MCH 29.0  26.0 - 34.0 pg   MCHC 33.5  30.0 - 36.0 g/dL   RDW 40.9  81.1 - 91.4 %   Platelets 254  150 - 400 K/uL  BASIC METABOLIC PANEL      Component Value Range   Sodium 133 (*) 135 - 145 mEq/L   Potassium 3.6  3.5 - 5.1 mEq/L   Chloride 98  96 - 112 mEq/L   CO2 23  19 - 32 mEq/L   Glucose, Bld 106 (*) 70 - 99 mg/dL  BUN 15  6 - 23 mg/dL   Creatinine, Ser 9.60  0.50 - 1.10 mg/dL   Calcium 8.5  8.4 - 45.4 mg/dL   GFR calc non Af Amer >90  >90 mL/min   GFR calc Af Amer >90  >90 mL/min    IV Vanc, basic labs and plan d/c home Keflex and close PCP follow up 48  hours.   MDM   L FA Cellulitis. No F/C or N/V. Labs reviewed as above. IV ABx now and RX prescribed. Return precautions verbalized as understood, cellulitis instructions provided.   VS and nursing notes reviewed and considered       Sunnie Nielsen, MD 01/13/13 [phone]

## 2013-01-14 LAB — H. PYLORI ANTIBODY, IGG: H Pylori IgG: 0.42 {ISR}

## 2013-01-24 ENCOUNTER — Encounter: Payer: Self-pay | Admitting: Emergency Medicine

## 2013-01-24 ENCOUNTER — Telehealth: Payer: Self-pay | Admitting: Radiology

## 2013-01-24 ENCOUNTER — Ambulatory Visit (INDEPENDENT_AMBULATORY_CARE_PROVIDER_SITE_OTHER): Payer: Medicare Other | Admitting: Emergency Medicine

## 2013-01-24 VITALS — BP 132/86 | HR 78 | Temp 97.8°F | Ht 63.0 in | Wt 202.0 lb

## 2013-01-24 DIAGNOSIS — R05 Cough: Secondary | ICD-10-CM

## 2013-01-24 DIAGNOSIS — R059 Cough, unspecified: Secondary | ICD-10-CM

## 2013-01-24 DIAGNOSIS — R053 Chronic cough: Secondary | ICD-10-CM

## 2013-01-24 DIAGNOSIS — J449 Chronic obstructive pulmonary disease, unspecified: Secondary | ICD-10-CM

## 2013-01-24 HISTORY — DX: Cough: R05

## 2013-01-24 HISTORY — DX: Chronic cough: R05.3

## 2013-01-24 NOTE — Assessment & Plan Note (Signed)
She believes that largest contributor to her cough and UA irritation is GERD, probably correct. She is also on ACE-I although unclear how much contribution. Could consider stopping the ACE-I, I will defer this to DR Clelia Croft. Her cough will likely persist until GERD controlled.  - PPI + H2 blockade - as per dr Alver Fisher notes, pt may need GI eval and possibly surgical eval for her GERD - temporarily stop QVAR and follow UA sx - will address her obstructive lung disease

## 2013-01-24 NOTE — Telephone Encounter (Signed)
[facility] was calling for spirometry results/ advised it is in body of Dr Clelia Croft dictation/ Kensley Valladares Karoline Caldwell

## 2013-01-24 NOTE — Progress Notes (Signed)
Subjective:    Patient ID: Summer Jackson, female    DOB: 1946-04-23, 67 y.o.   MRN: [id number]  HPI 22 former tobacco (70 pk-yrs, quit 2012), HTN, GERD, nasal congestion. Probable COPD with spiro done 01/10/13 at her PCP Dr Alver Fisher office. No curves available but raw data looks like mixed disease with a moderate decrease in FEV1 at 1.74L. The eval initiated due to persistent cough, occasional wheeze, exertional SOB. She c/o persistent cough, productive of thick tan mucous, GERD that has been refractory to PPI + H2 blocker. She is on zyrtec. She is on ACE-I.   She has been started on Spiriva , QVAR, about 1 month ago. She has been on albuterol for over 2 years, using prn with relief of exertional SOB and wheeze. She feels that the spiriva + QVAR may have helped >> less wheeze, better exertional tolerance.   She is interested in pulm rehab    Review of Systems  Constitutional: Negative for fever and unexpected weight change.  HENT: Positive for sneezing. Negative for ear pain, nosebleeds, congestion, sore throat, rhinorrhea, trouble swallowing, dental problem, postnasal drip and sinus pressure.   Eyes: Negative for redness and itching.  Respiratory: Positive for cough, chest tightness, shortness of breath and wheezing.   Cardiovascular: Negative for palpitations and leg swelling.  Gastrointestinal: Negative for nausea and vomiting.  Genitourinary: Negative for dysuria.  Musculoskeletal: Negative for joint swelling.  Skin: Negative for rash.  Neurological: Positive for headaches.  Hematological: Does not bruise/bleed easily.  Psychiatric/Behavioral: Negative for dysphoric mood. The patient is not nervous/anxious.    Past Medical History  Diagnosis Date  . Hypertension   . Hyperlipemia   . Headache     migraine  . GERD (gastroesophageal reflux disease)   . Rectal bleeding   . Arthritis   . Blood transfusion   . COPD (chronic obstructive pulmonary disease)   . Weight loss   . Nasal  congestion   . Wheezing   . Shortness of breath   . Asthma   . Lipoma     on abd  . Hemorrhoids   . Urinary frequency   . Hypertension   . Hyperlipidemia      Family History  Problem Relation Age of Onset  . Colon cancer Father   . Cancer Father     colon  . Heart disease Father   . Malignant hyperthermia Neg Hx   . Heart disease Paternal Uncle      History   Social History  . Marital Status: Divorced    Spouse Name: N/A    Number of Children: 2  . Years of Education: N/A   Occupational History  . cleaner     cleans apts and homes   Social History Main Topics  . Smoking status: Former Smoker -- 2.0 packs/day for 35 years    Types: Cigarettes    Quit date: 08/23/2011  . Smokeless tobacco: Never Used  . Alcohol Use: No  . Drug Use: No  . Sexually Active: Not on file   Other Topics Concern  . Not on file   Social History Narrative  . No narrative on file     Allergies  Allergen Reactions  . Sulfa Antibiotics Swelling and Rash  . Amoxicillin Other (See Comments)    REACTION: headache  . Azithromycin Other (See Comments)    REACTION: reddness around mouth  . Penicillins Other (See Comments)    REACTION: headache  . Propoxyphene-Acetaminophen Other (See Comments)  REACTION: mouth sores  . Clarithromycin Rash    REACTION: skin rash with minimal vesicular formation on arms and hands     Outpatient Prescriptions Prior to Visit  Medication Sig Dispense Refill  . albuterol (PROVENTIL HFA;VENTOLIN HFA) 108 (90 BASE) MCG/ACT inhaler Inhale 2 puffs into the lungs every 6 (six) hours as needed. Shortness of breath      . beclomethasone (QVAR) 80 MCG/ACT inhaler Inhale 1 puff into the lungs 2 (two) times daily. Rinse mouth after use.      . cephALEXin (KEFLEX) 500 MG capsule Take 1 capsule (500 mg total) by mouth 4 (four) times daily.  20 capsule  0  . cetirizine (ZYRTEC) 10 MG tablet Take 10 mg by mouth every evening.       Marland Kitchen dexlansoprazole (DEXILANT) 60 MG  capsule Take 1 capsule (60 mg total) by mouth daily.  30 capsule  2  . famotidine (PEPCID) 20 MG tablet Take 20 mg by mouth 2 (two) times daily as needed. For heartburn      . lisinopril (PRINIVIL,ZESTRIL) 10 MG tablet Take 10 mg by mouth daily.      . simvastatin (ZOCOR) 10 MG tablet Take 10 mg by mouth at bedtime.      Marland Kitchen tiotropium (SPIRIVA) 18 MCG inhalation capsule Place 18 mcg into inhaler and inhale daily.      Marland Kitchen topiramate (TOPAMAX) 50 MG tablet Take 50 mg by mouth 2 (two) times daily as needed. For headache pain      Last reviewed on 01/24/2013 10:15 AM by Leslye Peer, MD       Objective:   Physical Exam Filed Vitals:   01/24/13 0950  BP: 132/86  Pulse: 78  Temp: 97.8 F (36.6 C)   Gen: Pleasant, overwt, in no distress,  normal affect  ENT: No lesions,  mouth clear,  oropharynx clear, no postnasal drip  Neck: No JVD, no TMG, no carotid bruits  Lungs: No use of accessory muscles, soft B basilar insp crackles, no wheeze  Cardiovascular: RRR, heart sounds normal, no murmur or gallops, no peripheral edema  Musculoskeletal: No deformities, no cyanosis or clubbing  Neuro: alert, non focal  Skin: Warm, no lesions or rashes     Assessment & Plan:  COPD Probable COPD with possible coexisting restrictive disease.  - continue Spiriva and SABA prn  - stop the QVAR for now as it may be contributing to UR irritation and cough, and I am not clear yet on the degree of her obstructive disease.  - walking oximetry - full PFT; depending on degree of restrictive disease, will consider an ILD workup  - referral to pulm rehab - rov 1 month  Chronic cough She believes that largest contributor to her cough and UA irritation is GERD, probably correct. She is also on ACE-I although unclear how much contribution. Could consider stopping the ACE-I, I will defer this to DR Clelia Croft. Her cough will likely persist until GERD controlled.  - PPI + H2 blockade - as per dr Alver Fisher notes, pt may  need GI eval and possibly surgical eval for her GERD - temporarily stop QVAR and follow UA sx - will address her obstructive lung disease

## 2013-01-24 NOTE — Assessment & Plan Note (Addendum)
Probable COPD with possible coexisting restrictive disease.  - continue Spiriva and SABA prn  - stop the QVAR for now as it may be contributing to UR irritation and cough, and I am not clear yet on the degree of her obstructive disease.  - walking oximetry - full PFT; depending on degree of restrictive disease, will consider an ILD workup  - referral to pulm rehab - rov 1 month

## 2013-01-24 NOTE — Patient Instructions (Addendum)
Your oxygen level was stable with walking today Please continue your Spiriva daily Use albuterol 2 puffs as needed for shortness of breath Stop QVAR for now We will perform full pulmonary function tests We will refer you to pulmonary rehab  Follow with Dr Delton Coombes in 1 month

## 2013-01-26 ENCOUNTER — Other Ambulatory Visit: Payer: Self-pay | Admitting: Family Medicine

## 2013-02-05 ENCOUNTER — Ambulatory Visit (INDEPENDENT_AMBULATORY_CARE_PROVIDER_SITE_OTHER): Payer: Medicare Other | Admitting: Family Medicine

## 2013-02-05 ENCOUNTER — Ambulatory Visit: Payer: Medicare Other

## 2013-02-05 VITALS — BP 151/94 | HR 78 | Temp 97.2°F | Resp 18 | Ht 62.0 in | Wt 197.0 lb

## 2013-02-05 DIAGNOSIS — M19012 Primary osteoarthritis, left shoulder: Secondary | ICD-10-CM

## 2013-02-05 DIAGNOSIS — M19019 Primary osteoarthritis, unspecified shoulder: Secondary | ICD-10-CM

## 2013-02-05 DIAGNOSIS — M25512 Pain in left shoulder: Secondary | ICD-10-CM | POA: Insufficient documentation

## 2013-02-05 HISTORY — DX: Pain in left shoulder: M25.512

## 2013-02-05 MED ORDER — TRAMADOL HCL 50 MG PO TABS: 50.0000 mg | ORAL_TABLET | Freq: Three times a day (TID) | ORAL | Status: DC | PRN

## 2013-02-05 NOTE — Patient Instructions (Addendum)
Thank you for coming in today. Take the tramadol as needed for pain Do some exercises that we talked about Followup at [facility] [phone] in 2-4 weeks if not any better

## 2013-02-05 NOTE — Progress Notes (Addendum)
Summer Jackson is a 67 y.o. female who presents to [facility] today for left shoulder pain. Patient fell and landed on her left shoulder yesterday. She notes pain in the superior aspect of her left shoulder. Pain is worse with crossing over overhand motion. She denies any radiating pain weakness or numbness. She denies any fevers chills and feels well otherwise. She's tried some over-the-counter pain medications which have not been very helpful.   PMH: Reviewed significant for COPD History  Substance Use Topics  . Smoking status: Former Smoker -- 2.00 packs/day for 35 years    Types: Cigarettes    Quit date: 08/23/2011  . Smokeless tobacco: Never Used  . Alcohol Use: No   ROS as above  Medications reviewed. Current Outpatient Prescriptions  Medication Sig Dispense Refill  . albuterol (PROVENTIL HFA;VENTOLIN HFA) 108 (90 BASE) MCG/ACT inhaler Inhale 2 puffs into the lungs every 6 (six) hours as needed. Shortness of breath      . cetirizine (ZYRTEC) 10 MG tablet Take 10 mg by mouth every evening.       . famotidine (PEPCID) 20 MG tablet Take 20 mg by mouth 2 (two) times daily as needed. For heartburn      . lisinopril (PRINIVIL,ZESTRIL) 10 MG tablet Take 10 mg by mouth daily.      . simvastatin (ZOCOR) 10 MG tablet Take 10 mg by mouth at bedtime.      Marland Kitchen tiotropium (SPIRIVA) 18 MCG inhalation capsule Place 18 mcg into inhaler and inhale daily.      Marland Kitchen topiramate (TOPAMAX) 50 MG tablet Take 50 mg by mouth 2 (two) times daily as needed. For headache pain      . dexlansoprazole (DEXILANT) 60 MG capsule Take 1 capsule (60 mg total) by mouth daily.  30 capsule  2   No current facility-administered medications for this visit.    Exam:  BP 151/94  Pulse 78  Temp(Src) 97.2 F (36.2 C)  Resp 18  Ht 5\' 2"  (1.575 m)  Wt 197 lb (89.359 kg)  BMI 36.02 kg/m2  SpO2 99% Gen: Well NAD LEFT SHOULDER: Normal-appearing Tender to palpation over the a.c. joint Active range of motion abduction to 160  forward flexion to 160 external rotation 90 internal rotation to the lumbar spine Mildly positive Hawkin smears tests Positive crossover arm compression test Grip pulses capillary refill are intact distally  Preliminary three-view left shoulder: Wide a.c. joint normal-appearing scapular Y..  no fractures normal alignment.  Assessment and Plan: 67 y.o. female with  left shoulder injury. She says this poor grade 1 a.c. joint separation versus rotator cuff tear doubtful for full thickness tear  Plan :  Home exercise range of motion and strengthening program  Tramadol for pain  Followup with orthopedics in several weeks if not improved    02/06/13 Reviewed and agree with above plan. Tle

## 2013-02-12 ENCOUNTER — Ambulatory Visit (INDEPENDENT_AMBULATORY_CARE_PROVIDER_SITE_OTHER): Payer: Medicare Other | Admitting: Emergency Medicine

## 2013-02-12 DIAGNOSIS — J449 Chronic obstructive pulmonary disease, unspecified: Secondary | ICD-10-CM

## 2013-02-12 LAB — PULMONARY FUNCTION TEST

## 2013-02-12 NOTE — Progress Notes (Signed)
PFT done today. 

## 2013-03-07 ENCOUNTER — Ambulatory Visit: Payer: Medicare Other | Admitting: Emergency Medicine

## 2013-03-12 ENCOUNTER — Ambulatory Visit (INDEPENDENT_AMBULATORY_CARE_PROVIDER_SITE_OTHER): Payer: Medicare Other | Admitting: Family Medicine

## 2013-03-12 ENCOUNTER — Encounter: Payer: Self-pay | Admitting: Emergency Medicine

## 2013-03-12 ENCOUNTER — Ambulatory Visit: Payer: Medicare Other | Admitting: Emergency Medicine

## 2013-03-12 ENCOUNTER — Ambulatory Visit (INDEPENDENT_AMBULATORY_CARE_PROVIDER_SITE_OTHER): Payer: Medicare Other | Admitting: Emergency Medicine

## 2013-03-12 VITALS — BP 142/80 | HR 77 | Temp 96.6°F | Ht 63.0 in | Wt 197.0 lb

## 2013-03-12 VITALS — BP 130/75 | HR 80 | Temp 97.6°F | Resp 18 | Wt 194.0 lb

## 2013-03-12 DIAGNOSIS — J449 Chronic obstructive pulmonary disease, unspecified: Secondary | ICD-10-CM

## 2013-03-12 DIAGNOSIS — B9789 Other viral agents as the cause of diseases classified elsewhere: Secondary | ICD-10-CM

## 2013-03-12 DIAGNOSIS — B349 Viral infection, unspecified: Secondary | ICD-10-CM

## 2013-03-12 DIAGNOSIS — K219 Gastro-esophageal reflux disease without esophagitis: Secondary | ICD-10-CM

## 2013-03-12 DIAGNOSIS — R112 Nausea with vomiting, unspecified: Secondary | ICD-10-CM

## 2013-03-12 DIAGNOSIS — R053 Chronic cough: Secondary | ICD-10-CM

## 2013-03-12 DIAGNOSIS — R05 Cough: Secondary | ICD-10-CM

## 2013-03-12 DIAGNOSIS — R059 Cough, unspecified: Secondary | ICD-10-CM

## 2013-03-12 LAB — POCT CBC
Granulocyte percent: 67.2 %G (ref 37–80)
HCT, POC: 40.6 % (ref 37.7–47.9)
Hemoglobin: 12.8 g/dL (ref 12.2–16.2)
Lymph, poc: 2.5 (ref 0.6–3.4)
MCH, POC: 27.3 pg (ref 27–31.2)
MCHC: 31.5 g/dL — AB (ref 31.8–35.4)
MCV: 86.5 fL (ref 80–97)
MID (cbc): 0.5 (ref 0–0.9)
MPV: 8.2 fL (ref 0–99.8)
POC Granulocyte: 6.2 (ref 2–6.9)
POC LYMPH PERCENT: 27.6 %L (ref 10–50)
POC MID %: 5.2 %M (ref 0–12)
Platelet Count, POC: 308 10*3/uL (ref 142–424)
RBC: 4.69 M/uL (ref 4.04–5.48)
RDW, POC: 14.7 %
WBC: 9.2 10*3/uL (ref 4.6–10.2)

## 2013-03-12 MED ORDER — ONDANSETRON HCL 4 MG PO TABS: 8.0000 mg | ORAL_TABLET | Freq: Once | ORAL | Status: DC

## 2013-03-12 MED ORDER — PROMETHAZINE HCL 12.5 MG PO TABS: 12.5000 mg | ORAL_TABLET | Freq: Three times a day (TID) | ORAL | Status: DC | PRN

## 2013-03-12 NOTE — Assessment & Plan Note (Addendum)
Factors include uncontrolled GERD, poorly controlled allergies, lisinopril. Her spirometry is reassuring from 02/12/13.  - GI referral  - continue PPI + pepcid - zyrtec - add nasal steroid - stop spiriva (and stay off QVAR) - rov 2 months.

## 2013-03-12 NOTE — Patient Instructions (Addendum)
You apparently have an acute viral illness.  Please return if symptoms do not clear in 48 hours

## 2013-03-12 NOTE — Progress Notes (Signed)
  Subjective:    Patient ID: Summer Jackson, female    DOB: 05/05/1946, 67 y.o.   MRN: [id number]  HPI 31 former tobacco (70 pk-yrs, quit 2012), HTN, GERD, nasal congestion. Probable COPD with spiro done 01/10/13 at her PCP Dr Alver Fisher office. No curves available but raw data looks like mixed disease with a moderate decrease in FEV1 at 1.74L. The eval initiated due to persistent cough, occasional wheeze, exertional SOB. She c/o persistent cough, productive of thick tan mucous, GERD that has been refractory to PPI + H2 blocker. She is on zyrtec. She is on ACE-I.   She has been started on Spiriva , QVAR, about 1 month ago. She has been on albuterol for over 2 years, using prn with relief of exertional SOB and wheeze. She feels that the spiriva + QVAR may have helped >> less wheeze, better exertional tolerance.   She is interested in pulm rehab  ROV 03/12/13 -- follows up for COPD and chronic cough. We temporarily stopped QVAR for UA issues, continued spiriva. She is not sure that the spiriva is doing much for her. PFT 2/25 >> possible subtle AFL. She is c/o GERD that is breaking through meds (Dexilant + pepcid). She is on lisinopril. Taking zyrtec. She doesn't have a PCP, is asking for help obtaining one.    Review of Systems  Constitutional: Negative for fever and unexpected weight change.  HENT: Positive for sneezing. Negative for ear pain, nosebleeds, congestion, sore throat, rhinorrhea, trouble swallowing, dental problem, postnasal drip and sinus pressure.   Eyes: Negative for redness and itching.  Respiratory: Positive for cough, chest tightness, shortness of breath and wheezing.   Cardiovascular: Negative for palpitations and leg swelling.  Gastrointestinal: Negative for nausea and vomiting.  Genitourinary: Negative for dysuria.  Musculoskeletal: Negative for joint swelling.  Skin: Negative for rash.  Neurological: Positive for headaches.  Hematological: Does not bruise/bleed easily.   Psychiatric/Behavioral: Negative for dysphoric mood. The patient is not nervous/anxious.       Objective:   Physical Exam Filed Vitals:   03/12/13 1136  BP: 142/80  Pulse: 77  Temp: 96.6 F (35.9 C)   Gen: Pleasant, overwt, in no distress,  normal affect  ENT: No lesions,  mouth clear,  oropharynx clear, no postnasal drip  Neck: No JVD, no TMG, no carotid bruits  Lungs: No use of accessory muscles, soft B basilar insp crackles, no wheeze  Cardiovascular: RRR, heart sounds normal, no murmur or gallops, no peripheral edema  Musculoskeletal: No deformities, no cyanosis or clubbing  Neuro: alert, non focal  Skin: Warm, no lesions or rashes     Assessment & Plan:  Chronic cough Factors include uncontrolled GERD, poorly controlled allergies, lisinopril. Her spirometry is reassuring from 02/12/13.  - GI referral  - continue PPI + pepcid - zyrtec - add nasal steroid - stop spiriva (and stay off QVAR) - rov 2 months.   COPD Possible AFL on spiro although suspect very little contribution of COPD to current presentation.  - stop spiriva

## 2013-03-12 NOTE — Patient Instructions (Addendum)
We will refer you to [facility] for GERD Will help you find a PCP Start Fluticasone twice a day Stop Spiriva Continue with Zyrtec and all your other medications Follow Up in 2 months, sooner if needed

## 2013-03-12 NOTE — Progress Notes (Signed)
  Subjective:    Patient ID: Summer Jackson, female    DOB: 16-Jul-1946, 67 y.o.   MRN: [id number]  HPI  Patient states that she started a headache yesterday. The pain is in the back side of her head. She reports that she vomited about an hour ago. She noticed a rash that has started on her chest and back. Her acid reflux has been on-going problem but it seems to have escalated in the past day or two. She has only vomited once. She has had her appendix taken out.   She reports no other problems other than her reflux. Denies Abd pain, diarrhea, light headedness.   She is retired but Systems analyst.   Review of Systems Patient was seen by her pulmonary doctor today. He discussed her ongoing problem with reflux but he said he couldn't refer her for further evaluation.  There referral does show up on his visit, though.    Objective:   Physical Exam Patient in no acute distress but does look a little uncomfortable, she's pale but has good eye contact and is alert. HEENT: Unremarkable Chest: Clear to auscultation Heart: Regular no murmur without gallop, rub Abdomen: Hyperactive bowel sounds, no HSM, nontender, no masses, no guarding or rebound Extremities: No edema Skin:  Very mild erythematous papular rash over upper sternum Results for orders placed in visit on 03/12/13  POCT CBC      Result Value Range   WBC 9.2  4.6 - 10.2 K/uL   Lymph, poc 2.5  0.6 - 3.4   POC LYMPH PERCENT 27.6  10 - 50 %L   MID (cbc) 0.5  0 - 0.9   POC MID % 5.2  0 - 12 %M   POC Granulocyte 6.2  2 - 6.9   Granulocyte percent 67.2  37 - 80 %G   RBC 4.69  4.04 - 5.48 M/uL   Hemoglobin 12.8  12.2 - 16.2 g/dL   HCT, POC 40.9  81.1 - 47.9 %   MCV 86.5  80 - 97 fL   MCH, POC 27.3  27 - 31.2 pg   MCHC 31.5 (*) 31.8 - 35.4 g/dL   RDW, POC 91.4     Platelet Count, POC 308  142 - 424 K/uL   MPV 8.2  0 - 99.8 fL         Assessment & Plan:  Viral syndrome.

## 2013-03-13 ENCOUNTER — Telehealth: Payer: Self-pay

## 2013-03-13 NOTE — Telephone Encounter (Signed)
Unable to refer patient to GI. Pt has secondary insurance that requires primary care physician to refer. Pt has been advised of this and suggested that she contact her case worker to arrange. Summer Jackson

## 2013-03-13 NOTE — Telephone Encounter (Signed)
Pt is needing to talk with someone about a referral related to acid reflux and would like it close to [address]

## 2013-03-19 NOTE — Assessment & Plan Note (Signed)
Possible AFL on spiro although suspect very little contribution of COPD to current presentation.  - stop spiriva

## 2013-04-15 ENCOUNTER — Ambulatory Visit (INDEPENDENT_AMBULATORY_CARE_PROVIDER_SITE_OTHER): Payer: Medicare Other | Admitting: Internal Medicine

## 2013-04-15 VITALS — BP 135/82 | HR 81 | Temp 98.0°F | Resp 17 | Ht 62.5 in | Wt 189.0 lb

## 2013-04-15 DIAGNOSIS — D179 Benign lipomatous neoplasm, unspecified: Secondary | ICD-10-CM

## 2013-04-15 DIAGNOSIS — R21 Rash and other nonspecific skin eruption: Secondary | ICD-10-CM

## 2013-04-15 MED ORDER — DOXYCYCLINE HYCLATE 100 MG PO TABS: 100.0000 mg | ORAL_TABLET | Freq: Two times a day (BID) | ORAL | Status: DC

## 2013-04-15 NOTE — Patient Instructions (Signed)
Take meds as directed. Return if your rash worsens or if you develop any other symptoms.

## 2013-04-15 NOTE — Progress Notes (Signed)
  Subjective:    Patient ID: Summer Jackson, female    DOB: 09-Sep-1946, 67 y.o.   MRN: [id number]  HPI Rash. Pt c/o bumps on her left thigh and one on her right arm.onset 2 days ago. Soreness slight itch. Also has an area of tenderness on the forehead on the left side. No fever, no known tick bite or spider or insect bite. No systemic symptoms no nausea or vomiting. No new meds or change in products that she uses.   Review of Systems  Constitutional: Negative for fever, chills and fatigue.  Skin: Positive for rash.  All other systems reviewed and are negative.       Objective:   Physical Exam  Nursing note and vitals reviewed. Constitutional: She is oriented to person, place, and time. She appears well-developed and well-nourished.  HENT:  Head: Normocephalic and atraumatic.  Mouth/Throat: Oropharynx is clear and moist.  Eyes: Conjunctivae and EOM are normal. Pupils are equal, round, and reactive to light.  Neck: Normal range of motion. Neck supple.  Cardiovascular: Normal rate, regular rhythm and normal heart sounds.   Pulmonary/Chest: Effort normal and breath sounds normal.  Abdominal: Soft. Bowel sounds are normal.  Musculoskeletal: Normal range of motion.  Neurological: She is alert and oriented to person, place, and time.  Skin: Skin is warm and dry. Rash noted.  2cm nodular area of erythema with central clearing on the laterla left thigh. Similar nodular area on the right forearm but without central clearing and without redness.. Small area .5 cm of erythema and tenderness on the right forehead.  Psychiatric: She has a normal mood and affect. Her behavior is normal. Judgment and thought content normal.          Assessment & Plan:  Area of erythema with central clearing is suspicious for a tick bite  (lyme) or a spider bite. The area on the forearm feels like a lipoma and it is not red. I am also a bit concerned this may be an early presentation of erythema nodosum but pt has  not had an recent viral infections or any new meds. Plan to treat with antibiotics, doxycycline for 14 days. Pt instructeds to return if the rash worsens or does not improve with the doxycycline or any new symptoms arise.

## 2013-04-17 ENCOUNTER — Encounter: Payer: Self-pay | Admitting: Emergency Medicine

## 2013-04-17 ENCOUNTER — Ambulatory Visit (INDEPENDENT_AMBULATORY_CARE_PROVIDER_SITE_OTHER): Payer: Medicare Other | Admitting: Emergency Medicine

## 2013-04-17 VITALS — BP 136/100 | HR 83 | Temp 98.0°F | Ht 62.0 in | Wt 189.8 lb

## 2013-04-17 DIAGNOSIS — J449 Chronic obstructive pulmonary disease, unspecified: Secondary | ICD-10-CM

## 2013-04-17 DIAGNOSIS — R059 Cough, unspecified: Secondary | ICD-10-CM

## 2013-04-17 DIAGNOSIS — R05 Cough: Secondary | ICD-10-CM

## 2013-04-17 DIAGNOSIS — R053 Chronic cough: Secondary | ICD-10-CM

## 2013-04-17 MED ORDER — LOSARTAN POTASSIUM 50 MG PO TABS: 50.0000 mg | ORAL_TABLET | Freq: Every day | ORAL | Status: DC

## 2013-04-17 NOTE — Assessment & Plan Note (Signed)
-   very subtle by spirometry. No standing BD's indicated

## 2013-04-17 NOTE — Assessment & Plan Note (Signed)
-   continue allergy regimen - continue PPI + pepcid - needs referral to GI, but I cannot do, will have to come from her PCP Dr Parke Simmers - I will d/c ACE-I and start cozaar, send script to her pharmacy - rov prn

## 2013-04-17 NOTE — Progress Notes (Signed)
  Subjective:    Patient ID: Summer Jackson, female    DOB: 1946/02/16, 67 y.o.   MRN: [id number]  HPI 4 former tobacco (70 pk-yrs, quit 2012), HTN, GERD, nasal congestion. Probable COPD with spiro done 01/10/13 at her PCP Summer Jackson office. No curves available but raw data looks like mixed disease with a moderate decrease in FEV1 at 1.74L. The eval initiated due to persistent cough, occasional wheeze, exertional SOB. She c/o persistent cough, productive of thick tan mucous, GERD that has been refractory to PPI + H2 blocker. She is on zyrtec. She is on ACE-I.   She has been started on Spiriva , QVAR, about 1 month ago. She has been on albuterol for over 2 years, using prn with relief of exertional SOB and wheeze. She feels that the spiriva + QVAR may have helped >> less wheeze, better exertional tolerance.   She is interested in pulm rehab  ROV 03/12/13 -- follows up for COPD and chronic cough. We temporarily stopped QVAR for UA issues, continued spiriva. She is not sure that the spiriva is doing much for her. PFT 2/25 >> possible subtle AFL. She is c/o GERD that is breaking through meds (Dexilant + pepcid). She is on lisinopril. Taking zyrtec. She doesn't have a PCP, is asking for help obtaining one.   ROV 04/17/13 -- ? COPD and chronic cough. Summer Jackson has shown possible subtle AFL, but most of her sx have involved cough. We started fluticasone + zyrtec, dexilant + pepcid for GERD, did not see GI. She still gets heartburn. She is coughing much less, but still has the cough. Breathing well. Still on ACE-I.    Review of Systems  Constitutional: Negative for fever and unexpected weight change.  HENT: Positive for sneezing. Negative for ear pain, nosebleeds, congestion, sore throat, rhinorrhea, trouble swallowing, dental problem, postnasal drip and sinus pressure.   Eyes: Negative for redness and itching.  Respiratory: Positive for cough, chest tightness, shortness of breath and wheezing.   Cardiovascular:  Negative for palpitations and leg swelling.  Gastrointestinal: Negative for nausea and vomiting.  Genitourinary: Negative for dysuria.  Musculoskeletal: Negative for joint swelling.  Skin: Negative for rash.  Neurological: Positive for headaches.  Hematological: Does not bruise/bleed easily.  Psychiatric/Behavioral: Negative for dysphoric mood. The patient is not nervous/anxious.       Objective:   Physical Exam Filed Vitals:   04/17/13 1346  BP: 136/100  Pulse: 83  Temp: 98 F (36.7 C)   Gen: Pleasant, overwt, in no distress,  normal affect  ENT: No lesions,  mouth clear,  oropharynx clear, no postnasal drip  Neck: No JVD, no TMG, no carotid bruits  Lungs: No use of accessory muscles, soft B basilar insp crackles, no wheeze  Cardiovascular: RRR, heart sounds normal, no murmur or gallops, no peripheral edema  Musculoskeletal: No deformities, no cyanosis or clubbing  Neuro: alert, non focal  Skin: Warm, no lesions or rashes     Assessment & Plan:  Chronic cough - continue allergy regimen - continue PPI + pepcid - needs referral to GI, but I cannot do, will have to come from her PCP Summer Jackson - I will d/c ACE-I and start cozaar, send script to her pharmacy - rov prn  COPD - very subtle by spirometry. No standing BD's indicated

## 2013-04-17 NOTE — Patient Instructions (Addendum)
Continue your zyrtec, fluticasone nasal spray Continue dexilant and pepcid as you are taking them STOP lisinopril Start Cozaar as directed (for blood pressure) Follow with Dr Parke Simmers so you can be referred to gastroenterology for your persistent heart burn Follow with Dr Delton Coombes as needed for any changes in your breathing or your cough

## 2013-04-18 ENCOUNTER — Telehealth: Payer: Self-pay | Admitting: Psychology

## 2013-04-18 NOTE — Telephone Encounter (Signed)
Patient left VM yesterday wanting to cancel an appointment because of a conflict.  She doesn't have one scheduled here and I have never met her.  Called her back to try to point her in the right direction.  Had difficulty doing that but ultimately gave her the main hospital number so hopefully they can see where she needs to call to manage her appointments.

## 2013-04-23 ENCOUNTER — Encounter: Payer: Self-pay | Admitting: Physician Assistant

## 2013-04-23 ENCOUNTER — Other Ambulatory Visit: Payer: Self-pay | Admitting: Physician Assistant

## 2013-04-23 DIAGNOSIS — R0981 Nasal congestion: Secondary | ICD-10-CM

## 2013-04-23 DIAGNOSIS — J449 Chronic obstructive pulmonary disease, unspecified: Secondary | ICD-10-CM | POA: Insufficient documentation

## 2013-04-23 DIAGNOSIS — I10 Essential (primary) hypertension: Secondary | ICD-10-CM

## 2013-04-23 DIAGNOSIS — R51 Headache: Secondary | ICD-10-CM | POA: Insufficient documentation

## 2013-04-23 DIAGNOSIS — K219 Gastro-esophageal reflux disease without esophagitis: Secondary | ICD-10-CM | POA: Insufficient documentation

## 2013-04-23 DIAGNOSIS — E785 Hyperlipidemia, unspecified: Secondary | ICD-10-CM

## 2013-04-23 DIAGNOSIS — R634 Abnormal weight loss: Secondary | ICD-10-CM

## 2013-04-23 DIAGNOSIS — R519 Headache, unspecified: Secondary | ICD-10-CM | POA: Insufficient documentation

## 2013-04-23 NOTE — H&P (Signed)
TOTAL KNEE ADMISSION H&P  Patient is being admitted for left total knee arthroplasty.  Subjective:  Chief Complaint:left knee pain.  HPI: Summer Jackson, 66 y.o. female, has a history of pain and functional disability in the left knee due to arthritis and has failed non-surgical conservative treatments for greater than 12 weeks to includeNSAID's and/or analgesics, corticosteriod injections, viscosupplementation injections, flexibility and strengthening excercises, supervised PT with diminished ADL's post treatment, use of assistive devices, weight reduction as appropriate and activity modification.  Onset of symptoms was gradual, starting 10 years ago with gradually worsening course since that time. The patient noted prior procedures on the knee to include  arthroscopy and menisectomy on the left knee(s).  Patient currently rates pain in the left knee(s) at 10 out of 10 with activity. Patient has night pain, worsening of pain with activity and weight bearing, pain that interferes with activities of daily living, crepitus and joint swelling.  Patient has evidence of subchondral cysts, periarticular osteophytes and joint space narrowing by imaging studies.There is no active infection.  Patient Active Problem List   Diagnosis Date Noted  . Hypertension   . Hyperlipemia   . Headache   . GERD (gastroesophageal reflux disease)   . COPD (chronic obstructive pulmonary disease)   . Weight loss   . Nasal congestion   . Left shoulder pain 02/05/2013  . Chronic cough 01/24/2013  . Prediabetes 01/10/2013  . Lipoma of abdominal wall 01/23/2012  . Hemorrhoids 01/23/2012  . Rectal bleeding 12/22/2011  . Family history of malignant neoplasm of gastrointestinal tract 12/22/2011  . OSTEOARTHRITIS, KNEE, LEFT 09/10/2009  . COPD 08/25/2009  . GERD 08/25/2009  . SHINGLES, RECURRENT 09/30/2008  . HYPERLIPIDEMIA 08/25/2007  . COMMON MIGRAINE 08/09/2007  . HYPERTENSION 08/09/2007  . CONGENITAL HEART DISEASE,  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   Past Medical History  Diagnosis Date  . Hypertension   . Hyperlipemia   . Headache     migraine  . GERD (gastroesophageal reflux disease)   . Rectal bleeding   . Arthritis   . Blood transfusion   . COPD (chronic obstructive pulmonary disease)   . Weight loss   . Nasal congestion   . Wheezing   . Shortness of breath   . Asthma   . Lipoma     on abd  . Hemorrhoids   . Urinary frequency   . Hypertension   . Hyperlipidemia     Past Surgical History  Procedure Laterality Date  . Heart surgery at 8yr      age 8 -flow through heart not normal-per pt  . Appendectomy    . Colonoscopy    . Left ovary and fallopian tube removed    . Colonoscopy  12/22/2011    Procedure: COLONOSCOPY;  Surgeon: Vincent C. Schooler, MD;  Location: [facility];  Service: Endoscopy;  Laterality: N/A;  . Ovarian tumor    . Breast tumor      left  . Knee arthroscopy      left x2  . Lipoma excision  02/15/2012    Procedure: EXCISION LIPOMA;  Surgeon: Thomas A. Cornett, MD;  Location: [facility];  Service: General;  Laterality: Left;  excision lipoma Left Lower abdomen  . Breast surgery      tumor removed from under breast     (Not in a hospital admission) Allergies  Allergen Reactions  . Sulfa Antibiotics Swelling and Rash  . Amoxicillin Other (See Comments)    REACTION: headache  . Azithromycin Other (See   Comments)    REACTION: reddness around mouth  . Penicillins Other (See Comments)    REACTION: headache  . Propoxyphene-Acetaminophen Other (See Comments)    REACTION: mouth sores  . Clarithromycin Rash    REACTION: skin rash with minimal vesicular formation on arms and hands    Current Outpatient Prescriptions on File Prior to Visit  Medication Sig Dispense Refill  . albuterol (PROVENTIL HFA;VENTOLIN HFA) 108 (90 BASE) MCG/ACT inhaler Inhale 2 puffs into the lungs every 6 (six) hours as needed. Shortness of breath      . cetirizine (ZYRTEC) 10 MG tablet  Take 10 mg by mouth every evening.       . dexlansoprazole (DEXILANT) 60 MG capsule Take 1 capsule (60 mg total) by mouth daily.  30 capsule  2  . doxycycline (VIBRA-TABS) 100 MG tablet Take 1 tablet (100 mg total) by mouth 2 (two) times daily.  20 tablet  0  . famotidine (PEPCID) 20 MG tablet Take 20 mg by mouth 2 (two) times daily as needed. For heartburn      . losartan (COZAAR) 50 MG tablet Take 1 tablet (50 mg total) by mouth daily.  30 tablet  11  . promethazine (PHENERGAN) 12.5 MG tablet Take 1 tablet (12.5 mg total) by mouth every 8 (eight) hours as needed for nausea.  10 tablet  0  . simvastatin (ZOCOR) 10 MG tablet Take 10 mg by mouth at bedtime.      . topiramate (TOPAMAX) 50 MG tablet Take 50 mg by mouth 2 (two) times daily as needed. For headache pain      . traMADol (ULTRAM) 50 MG tablet Take 1 tablet (50 mg total) by mouth every 8 (eight) hours as needed for pain.  30 tablet  0   Current Facility-Administered Medications on File Prior to Visit  Medication Dose Route Frequency Provider Last Rate Last Dose  . ondansetron (ZOFRAN) tablet 8 mg  8 mg Oral Once Kurt Lauenstein, MD        History  Substance Use Topics  . Smoking status: Former Smoker -- 2.00 packs/day for 35 years    Types: Cigarettes    Quit date: 08/23/2011  . Smokeless tobacco: Never Used  . Alcohol Use: No    Family History  Problem Relation Age of Onset  . Colon cancer Father   . Cancer Father     colon  . Heart disease Father   . Malignant hyperthermia Neg Hx   . Heart disease Paternal Uncle   . Diabetes Sister      Review of Systems  Constitutional: Negative.   Respiratory: Positive for cough and shortness of breath.   Gastrointestinal: Negative.   Genitourinary: Negative.   Musculoskeletal: Positive for joint pain.  Skin: Negative.   Neurological: Negative.   Endo/Heme/Allergies: Negative.   Psychiatric/Behavioral: Negative.     Objective:  Physical Exam  Constitutional: She is  oriented to person, place, and time. She appears well-developed and well-nourished.  HENT:  Head: Normocephalic and atraumatic.  Eyes: Conjunctivae and EOM are normal. Pupils are equal, round, and reactive to light.  Neck: Neck supple.  Cardiovascular: Normal rate and regular rhythm.   Respiratory: Effort normal and breath sounds normal.  GI: Soft. Bowel sounds are normal.  Genitourinary:  Not pertinent to current symptomatology therefore not examined.  Musculoskeletal:  Gait is antalgci no assistive devices  Left knee decreased range of motion. 0-100.   Positive effusion.  Joint line tender.  Positive crepitus.  Ligaments   stable.  Calf non-tender.  Neurovascularly intact.  Skin warm and dry. Right knee has full range of motion 2+ crep. 2+synovitis. 2+ dorsal pedis pulse  Neurological: She is alert and oriented to person, place, and time.  Skin: Skin is warm and dry.    Vital signs in last 24 hours: Last recorded: 05/06 1300   BP: 137/82 Pulse: 84  Temp: 97.6 F (36.4 C)    Height: 5' 4" (1.626 m) SpO2: 98  Weight: 85.276 kg (188 lb)     Labs:   Estimated body mass index is 32.25 kg/(m^2) as calculated from the following:   Height as of this encounter: 5' 4" (1.626 m).   Weight as of this encounter: 85.276 kg (188 lb).   Imaging Review Plain radiographs demonstrate severe degenerative joint disease of the left knee(s). The overall alignment issignificant varus. The bone quality appears to be good for age and reported activity level.  Assessment/Plan:  End stage arthritis, left knee Patient Active Problem List   Diagnosis Date Noted  . Hypertension   . Hyperlipemia   . Headache   . GERD (gastroesophageal reflux disease)   . COPD (chronic obstructive pulmonary disease)   . Weight loss   . Nasal congestion   . Left shoulder pain 02/05/2013  . Chronic cough 01/24/2013  . Prediabetes 01/10/2013  . Lipoma of abdominal wall 01/23/2012  . Hemorrhoids 01/23/2012  .  Rectal bleeding 12/22/2011  . Family history of malignant neoplasm of gastrointestinal tract 12/22/2011  . OSTEOARTHRITIS, KNEE, LEFT 09/10/2009  . COPD 08/25/2009  . GERD 08/25/2009  . SHINGLES, RECURRENT 09/30/2008  . HYPERLIPIDEMIA 08/25/2007  . COMMON MIGRAINE 08/09/2007  . HYPERTENSION 08/09/2007  . CONGENITAL HEART DISEASE, 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     The patient history, physical examination, clinical judgment of the provider and imaging studies are consistent with end stage degenerative joint disease of the left knee(s) and total knee arthroplasty is deemed medically necessary. The treatment options including medical management, injection therapy arthroscopy and arthroplasty were discussed at length. The risks and benefits of total knee arthroplasty were presented and reviewed. The risks due to aseptic loosening, infection, stiffness, patella tracking problems, thromboembolic complications and other imponderables were discussed. The patient acknowledged the explanation, agreed to proceed with the plan and consent was signed. Patient is being admitted for inpatient treatment for surgery, pain control, PT, OT, prophylactic antibiotics, VTE prophylaxis, progressive ambulation and ADL's and discharge planning. The patient is planning to be discharged to skilled nursing facility [facility]   

## 2013-04-25 ENCOUNTER — Encounter (HOSPITAL_COMMUNITY): Payer: Self-pay | Admitting: Pharmacy Technician

## 2013-04-29 ENCOUNTER — Encounter (HOSPITAL_COMMUNITY)
Admission: RE | Admit: 2013-04-29 | Discharge: 2013-04-29 | Disposition: A | Discharge status: Home / Self Care | Payer: Medicare Other | Source: Ambulatory Visit | Attending: Orthopedic Surgery | Admitting: Orthopedic Surgery

## 2013-04-29 ENCOUNTER — Encounter (HOSPITAL_COMMUNITY): Payer: Self-pay

## 2013-04-29 LAB — URINALYSIS, ROUTINE W REFLEX MICROSCOPIC
Glucose, UA: NEGATIVE mg/dL
Ketones, ur: NEGATIVE mg/dL
Leukocytes, UA: NEGATIVE
Nitrite: NEGATIVE
Protein, ur: 30 mg/dL — AB
Specific Gravity, Urine: 1.025 (ref 1.005–1.030)
Urobilinogen, UA: 0.2 mg/dL (ref 0.0–1.0)
pH: 5.5 (ref 5.0–8.0)

## 2013-04-29 LAB — CBC WITH DIFFERENTIAL/PLATELET
Basophils Absolute: 0.1 10*3/uL (ref 0.0–0.1)
Basophils Relative: 1 % (ref 0–1)
Eosinophils Absolute: 0.3 10*3/uL (ref 0.0–0.7)
Eosinophils Relative: 3 % (ref 0–5)
HCT: 40.3 % (ref 36.0–46.0)
Hemoglobin: 13.5 g/dL (ref 12.0–15.0)
Lymphocytes Relative: 27 % (ref 12–46)
Lymphs Abs: 2.5 10*3/uL (ref 0.7–4.0)
MCH: 27.9 pg (ref 26.0–34.0)
MCHC: 33.5 g/dL (ref 30.0–36.0)
MCV: 83.3 fL (ref 78.0–100.0)
Monocytes Absolute: 0.6 10*3/uL (ref 0.1–1.0)
Monocytes Relative: 6 % (ref 3–12)
Neutro Abs: 5.6 10*3/uL (ref 1.7–7.7)
Neutrophils Relative %: 63 % (ref 43–77)
Platelets: 264 10*3/uL (ref 150–400)
RBC: 4.84 MIL/uL (ref 3.87–5.11)
RDW: 14 % (ref 11.5–15.5)
WBC: 8.9 10*3/uL (ref 4.0–10.5)

## 2013-04-29 LAB — COMPREHENSIVE METABOLIC PANEL
ALT: 20 U/L (ref 0–35)
AST: 17 U/L (ref 0–37)
Albumin: 4.1 g/dL (ref 3.5–5.2)
Alkaline Phosphatase: 68 U/L (ref 39–117)
BUN: 19 mg/dL (ref 6–23)
CO2: 28 mEq/L (ref 19–32)
Calcium: 10.4 mg/dL (ref 8.4–10.5)
Chloride: 103 mEq/L (ref 96–112)
Creatinine, Ser: 0.76 mg/dL (ref 0.50–1.10)
GFR calc Af Amer: >90 mL/min (ref >90)
GFR calc non Af Amer: 87 mL/min — ABNORMAL LOW (ref >90)
Glucose, Bld: 99 mg/dL (ref 70–99)
Potassium: 4.5 mEq/L (ref 3.5–5.1)
Sodium: 140 mEq/L (ref 135–145)
Total Bilirubin: 0.2 mg/dL — ABNORMAL LOW (ref 0.3–1.2)
Total Protein: 7 g/dL (ref 6.0–8.3)

## 2013-04-29 LAB — URINE MICROSCOPIC-ADD ON

## 2013-04-29 LAB — TYPE AND SCREEN
ABO/RH(D): A POS
Antibody Screen: NEGATIVE

## 2013-04-29 LAB — SURGICAL PCR SCREEN
MRSA, PCR: NEGATIVE
Staphylococcus aureus: NEGATIVE

## 2013-04-29 LAB — APTT: aPTT: 28 seconds (ref 24–37)

## 2013-04-29 LAB — PROTIME-INR
INR: 0.87 (ref 0.00–1.49)
Prothrombin Time: 11.8 seconds (ref 11.6–15.2)

## 2013-04-29 NOTE — Pre-Procedure Instructions (Signed)
Tayjah Lobdell  04/29/2013   Your procedure is scheduled on:  05-08-2013  Wednesday   Report to [facility] at 9:50 AM.  Call this number if you have problems the morning of surgery: [phone]   Remember:   Do not eat food or drink liquids after midnight.    Take these medicines the morning of surgery with A SIP OF WATER: inhaler as needed,doxycycline(Vibra-Tabs)nexium,       Do not wear jewelry, make-up or nail polish.  Do not wear lotions, powders, or perfumes.   Do not shave 48 hours prior to surgery. .  Do not bring valuables to the hospital.  Contacts, dentures or bridgework may not be worn into surgery.  Leave suitcase in the car. After surgery it may be brought to your room.   For patients admitted to the hospital, checkout time is 11:00 AM the day of discharge.  Marland Kitchen   Special Instructions: Shower using CHG 2 nights before surgery and the night before surgery.  If you shower the day of surgery use CHG.  Use special wash - you have one bottle of CHG for all showers.  You should use approximately 1/3 of the bottle for each shower.   Please read over the following fact sheets that you were given: Pain Booklet, Coughing and Deep Breathing, Blood Transfusion Information and Surgical Site Infection Prevention

## 2013-04-30 ENCOUNTER — Telehealth: Payer: Self-pay | Admitting: Emergency Medicine

## 2013-04-30 ENCOUNTER — Other Ambulatory Visit (HOSPITAL_COMMUNITY): Payer: Medicare Other

## 2013-04-30 LAB — URINE CULTURE: Colony Count: 25000

## 2013-04-30 NOTE — Telephone Encounter (Signed)
I spoke with pt. She stated she is scheduled for left knee replacement next wed. Dr. Parke Simmers is needing a clearance letter faxed to [phone]. She states she needs this done ASAP. Please advise RB thanks

## 2013-05-01 ENCOUNTER — Telehealth (HOSPITAL_COMMUNITY): Payer: Self-pay | Admitting: *Deleted

## 2013-05-01 ENCOUNTER — Encounter: Payer: Self-pay | Admitting: Emergency Medicine

## 2013-05-01 NOTE — Telephone Encounter (Signed)
Call placed to this patient, no answer.  We did note in her record that she is scheduled in the next week to have total knee replacement.  We will wait for referral after she is released from surgery.  Cathie Olden RN

## 2013-05-01 NOTE — Telephone Encounter (Signed)
done

## 2013-05-02 NOTE — Telephone Encounter (Signed)
Letter was faxed yesterday. Closing msg

## 2013-05-02 NOTE — Telephone Encounter (Signed)
Summer Jackson was this letter faxed? Carron Curie, CMA

## 2013-05-07 MED ORDER — CHLORHEXIDINE GLUCONATE 4 % EX LIQD: 60.0000 mL | Freq: Once | CUTANEOUS | Status: DC

## 2013-05-07 MED ORDER — VANCOMYCIN HCL IN DEXTROSE 1-5 GM/200ML-% IV SOLN
1000.0000 mg | INTRAVENOUS | Status: AC
  Administered 2013-05-08: 1000 mg via INTRAVENOUS
  Filled 2013-05-07: qty 200

## 2013-05-07 MED ORDER — LACTATED RINGERS IV SOLN: INTRAVENOUS | Status: DC

## 2013-05-07 MED ORDER — POVIDONE-IODINE 7.5 % EX SOLN
Freq: Once | CUTANEOUS | Status: DC
  Filled 2013-05-07: qty 118

## 2013-05-07 NOTE — Progress Notes (Addendum)
Informed pt. Of time change for surgery. Instructed to arrive at 0830 tomorrow morning. Pt. Stating she has appointment today with Dr. Parke Simmers at 1600 for clearance for surgery.  Spoke with Roanna Raider, scheduler for Dr. Eulah Pont, asking her to fax over clearance notes from Dr. Parke Simmers and Dr. Inis Sizer once they receive them to  Short stay A.  Received clearance note from Dr. Audria Nine, placed in chart. Also have clearance note  From Dr. Delton Coombes.

## 2013-05-08 ENCOUNTER — Inpatient Hospital Stay (HOSPITAL_COMMUNITY)
Admission: RE | Admit: 2013-05-08 | Discharge: 2013-05-11 | DRG: 470 | Disposition: A | Discharge status: Skilled Nursing Facility | Payer: Medicare Other | Source: Ambulatory Visit | Attending: Orthopedic Surgery | Admitting: Orthopedic Surgery

## 2013-05-08 ENCOUNTER — Encounter (HOSPITAL_COMMUNITY)
Admission: RE | Disposition: A | Discharge status: Skilled Nursing Facility | Payer: Self-pay | Source: Ambulatory Visit | Attending: Orthopedic Surgery

## 2013-05-08 ENCOUNTER — Encounter (HOSPITAL_COMMUNITY): Payer: Self-pay | Admitting: Anesthesiology

## 2013-05-08 ENCOUNTER — Encounter (HOSPITAL_COMMUNITY): Payer: Self-pay | Admitting: *Deleted

## 2013-05-08 ENCOUNTER — Inpatient Hospital Stay (HOSPITAL_COMMUNITY): Payer: Medicare Other

## 2013-05-08 ENCOUNTER — Inpatient Hospital Stay (HOSPITAL_COMMUNITY): Payer: Medicare Other | Admitting: Anesthesiology

## 2013-05-08 DIAGNOSIS — M179 Osteoarthritis of knee, unspecified: Secondary | ICD-10-CM | POA: Diagnosis present

## 2013-05-08 DIAGNOSIS — Q249 Congenital malformation of heart, unspecified: Secondary | ICD-10-CM

## 2013-05-08 DIAGNOSIS — E785 Hyperlipidemia, unspecified: Secondary | ICD-10-CM | POA: Diagnosis present

## 2013-05-08 DIAGNOSIS — Z87891 Personal history of nicotine dependence: Secondary | ICD-10-CM

## 2013-05-08 DIAGNOSIS — I10 Essential (primary) hypertension: Secondary | ICD-10-CM | POA: Diagnosis present

## 2013-05-08 DIAGNOSIS — Z96659 Presence of unspecified artificial knee joint: Secondary | ICD-10-CM

## 2013-05-08 DIAGNOSIS — K219 Gastro-esophageal reflux disease without esophagitis: Secondary | ICD-10-CM | POA: Diagnosis present

## 2013-05-08 DIAGNOSIS — J4489 Other specified chronic obstructive pulmonary disease: Secondary | ICD-10-CM | POA: Diagnosis present

## 2013-05-08 DIAGNOSIS — J449 Chronic obstructive pulmonary disease, unspecified: Secondary | ICD-10-CM | POA: Diagnosis present

## 2013-05-08 DIAGNOSIS — M1710 Unilateral primary osteoarthritis, unspecified knee: Principal | ICD-10-CM | POA: Diagnosis present

## 2013-05-08 DIAGNOSIS — Z01812 Encounter for preprocedural laboratory examination: Secondary | ICD-10-CM

## 2013-05-08 DIAGNOSIS — IMO0002 Reserved for concepts with insufficient information to code with codable children: Secondary | ICD-10-CM

## 2013-05-08 HISTORY — DX: Shortness of breath: R06.02

## 2013-05-08 HISTORY — PX: TOTAL KNEE ARTHROPLASTY: SHX125

## 2013-05-08 HISTORY — DX: Migraine, unspecified, not intractable, without status migrainosus: G43.909

## 2013-05-08 HISTORY — DX: Personal history of other diseases of the digestive system: Z87.19

## 2013-05-08 LAB — CREATININE, SERUM
Creatinine, Ser: 0.67 mg/dL (ref 0.50–1.10)
GFR calc Af Amer: >90 mL/min (ref >90)
GFR calc non Af Amer: >90 mL/min (ref >90)

## 2013-05-08 LAB — CBC
HCT: 37 % (ref 36.0–46.0)
Hemoglobin: 12.4 g/dL (ref 12.0–15.0)
MCH: 27.9 pg (ref 26.0–34.0)
MCHC: 33.5 g/dL (ref 30.0–36.0)
MCV: 83.3 fL (ref 78.0–100.0)
Platelets: 234 10*3/uL (ref 150–400)
RBC: 4.44 MIL/uL (ref 3.87–5.11)
RDW: 14.2 % (ref 11.5–15.5)
WBC: 17.4 10*3/uL — ABNORMAL HIGH (ref 4.0–10.5)

## 2013-05-08 SURGERY — ARTHROPLASTY, KNEE, TOTAL
Anesthesia: General | Site: Knee | Laterality: Left | Wound class: Clean

## 2013-05-08 MED ORDER — ALBUTEROL SULFATE HFA 108 (90 BASE) MCG/ACT IN AERS
2.0000 | INHALATION_SPRAY | Freq: Four times a day (QID) | RESPIRATORY_TRACT | Status: DC | PRN
Start: 2013-05-08 — End: 2013-05-11

## 2013-05-08 MED ORDER — BUPIVACAINE HCL (PF) 0.25 % IJ SOLN
INTRAMUSCULAR | Status: DC | PRN
  Administered 2013-05-08: 10 mL

## 2013-05-08 MED ORDER — VANCOMYCIN HCL IN DEXTROSE 1-5 GM/200ML-% IV SOLN
1000.0000 mg | Freq: Two times a day (BID) | INTRAVENOUS | Status: AC
  Administered 2013-05-08: 1000 mg via INTRAVENOUS
  Filled 2013-05-08: qty 200

## 2013-05-08 MED ORDER — ENOXAPARIN SODIUM 30 MG/0.3ML ~~LOC~~ SOLN
30.0000 mg | Freq: Two times a day (BID) | SUBCUTANEOUS | Status: DC
  Administered 2013-05-09 – 2013-05-10 (×3): 30 mg via SUBCUTANEOUS
  Filled 2013-05-08 (×5): qty 0.3

## 2013-05-08 MED ORDER — SODIUM CHLORIDE 0.9 % IJ SOLN
INTRAMUSCULAR | Status: DC | PRN
  Administered 2013-05-08: 12:00:00

## 2013-05-08 MED ORDER — ONDANSETRON HCL 4 MG/2ML IJ SOLN
INTRAMUSCULAR | Status: DC | PRN
  Administered 2013-05-08: 4 mg via INTRAVENOUS

## 2013-05-08 MED ORDER — DOCUSATE SODIUM 100 MG PO CAPS
100.0000 mg | ORAL_CAPSULE | Freq: Two times a day (BID) | ORAL | Status: DC
  Administered 2013-05-08 – 2013-05-11 (×7): 100 mg via ORAL
  Filled 2013-05-08 (×7): qty 1

## 2013-05-08 MED ORDER — PHENOL 1.4 % MT LIQD: 1.0000 | OROMUCOSAL | Status: DC | PRN

## 2013-05-08 MED ORDER — PROPOFOL 10 MG/ML IV BOLUS
INTRAVENOUS | Status: DC | PRN
  Administered 2013-05-08: 160 mg via INTRAVENOUS

## 2013-05-08 MED ORDER — LORATADINE 10 MG PO TABS
10.0000 mg | ORAL_TABLET | Freq: Every day | ORAL | Status: DC
  Administered 2013-05-08 – 2013-05-11 (×4): 10 mg via ORAL
  Filled 2013-05-08 (×4): qty 1

## 2013-05-08 MED ORDER — LIDOCAINE HCL (CARDIAC) 20 MG/ML IV SOLN
INTRAVENOUS | Status: DC | PRN
  Administered 2013-05-08: 60 mg via INTRAVENOUS

## 2013-05-08 MED ORDER — TOPIRAMATE 25 MG PO TABS
50.0000 mg | ORAL_TABLET | Freq: Two times a day (BID) | ORAL | Status: DC | PRN
  Filled 2013-05-08: qty 2

## 2013-05-08 MED ORDER — ALUM & MAG HYDROXIDE-SIMETH 200-200-20 MG/5ML PO SUSP: 30.0000 mL | ORAL | Status: DC | PRN

## 2013-05-08 MED ORDER — HYDROMORPHONE HCL PF 1 MG/ML IJ SOLN
0.5000 mg | INTRAMUSCULAR | Status: DC | PRN
  Administered 2013-05-08: 0.5 mg via INTRAVENOUS

## 2013-05-08 MED ORDER — HYDROMORPHONE HCL PF 1 MG/ML IJ SOLN: 0.2500 mg | INTRAMUSCULAR | Status: DC | PRN

## 2013-05-08 MED ORDER — ACETAMINOPHEN 650 MG RE SUPP: 650.0000 mg | Freq: Four times a day (QID) | RECTAL | Status: DC | PRN

## 2013-05-08 MED ORDER — OXYCODONE HCL 5 MG PO TABS: 5.0000 mg | ORAL_TABLET | Freq: Once | ORAL | Status: DC | PRN

## 2013-05-08 MED ORDER — BISACODYL 10 MG RE SUPP
10.0000 mg | Freq: Every day | RECTAL | Status: DC | PRN
  Administered 2013-05-10: 10 mg via RECTAL
  Filled 2013-05-08: qty 1

## 2013-05-08 MED ORDER — WARFARIN SODIUM 5 MG PO TABS
5.0000 mg | ORAL_TABLET | Freq: Once | ORAL | Status: AC
  Administered 2013-05-08: 5 mg via ORAL
  Filled 2013-05-08: qty 1

## 2013-05-08 MED ORDER — NEOSTIGMINE METHYLSULFATE 1 MG/ML IJ SOLN
INTRAMUSCULAR | Status: DC | PRN
  Administered 2013-05-08: 3 mg via INTRAVENOUS

## 2013-05-08 MED ORDER — MENTHOL 3 MG MT LOZG: 1.0000 | LOZENGE | OROMUCOSAL | Status: DC | PRN

## 2013-05-08 MED ORDER — DEXAMETHASONE SODIUM PHOSPHATE 10 MG/ML IJ SOLN
10.0000 mg | Freq: Three times a day (TID) | INTRAMUSCULAR | Status: AC
  Administered 2013-05-08: 10 mg via INTRAVENOUS
  Filled 2013-05-08 (×3): qty 1

## 2013-05-08 MED ORDER — OXYCODONE HCL 5 MG PO TABS
5.0000 mg | ORAL_TABLET | ORAL | Status: DC | PRN
  Administered 2013-05-08: 10 mg via ORAL
  Administered 2013-05-09: 5 mg via ORAL
  Administered 2013-05-09: 10 mg via ORAL
  Administered 2013-05-09 (×2): 5 mg via ORAL
  Administered 2013-05-10 – 2013-05-11 (×6): 10 mg via ORAL
  Filled 2013-05-08 (×5): qty 2
  Filled 2013-05-08: qty 1
  Filled 2013-05-08 (×3): qty 2
  Filled 2013-05-08: qty 1

## 2013-05-08 MED ORDER — HYDROMORPHONE HCL PF 1 MG/ML IJ SOLN
INTRAMUSCULAR | Status: AC
  Filled 2013-05-08: qty 1

## 2013-05-08 MED ORDER — METOCLOPRAMIDE HCL 5 MG/ML IJ SOLN: 5.0000 mg | Freq: Three times a day (TID) | INTRAMUSCULAR | Status: DC | PRN

## 2013-05-08 MED ORDER — FENTANYL CITRATE 0.05 MG/ML IJ SOLN
INTRAMUSCULAR | Status: DC | PRN
  Administered 2013-05-08: 100 ug via INTRAVENOUS
  Administered 2013-05-08 (×2): 25 ug via INTRAVENOUS
  Administered 2013-05-08: 50 ug via INTRAVENOUS
  Administered 2013-05-08: 100 ug via INTRAVENOUS

## 2013-05-08 MED ORDER — METOCLOPRAMIDE HCL 5 MG PO TABS
5.0000 mg | ORAL_TABLET | Freq: Three times a day (TID) | ORAL | Status: DC | PRN
  Filled 2013-05-08: qty 2

## 2013-05-08 MED ORDER — PANTOPRAZOLE SODIUM 40 MG PO TBEC
80.0000 mg | DELAYED_RELEASE_TABLET | Freq: Every day | ORAL | Status: DC
  Administered 2013-05-09 – 2013-05-10 (×2): 80 mg via ORAL
  Filled 2013-05-08 (×2): qty 2

## 2013-05-08 MED ORDER — OXYCODONE HCL 5 MG PO TABS
ORAL_TABLET | ORAL | Status: AC
  Filled 2013-05-08: qty 2

## 2013-05-08 MED ORDER — ONDANSETRON HCL 4 MG/2ML IJ SOLN: 4.0000 mg | Freq: Once | INTRAMUSCULAR | Status: DC | PRN

## 2013-05-08 MED ORDER — HYDROCODONE-ACETAMINOPHEN 7.5-325 MG PO TABS
1.0000 | ORAL_TABLET | Freq: Four times a day (QID) | ORAL | Status: DC
  Administered 2013-05-08 – 2013-05-11 (×11): 1 via ORAL
  Filled 2013-05-08 (×11): qty 1

## 2013-05-08 MED ORDER — BUPIVACAINE LIPOSOME 1.3 % IJ SUSP
20.0000 mL | Freq: Once | INTRAMUSCULAR | Status: DC
  Filled 2013-05-08: qty 20

## 2013-05-08 MED ORDER — DOXYCYCLINE HYCLATE 100 MG PO TABS
100.0000 mg | ORAL_TABLET | Freq: Two times a day (BID) | ORAL | Status: DC
  Administered 2013-05-08 – 2013-05-09 (×3): 100 mg via ORAL
  Filled 2013-05-08 (×4): qty 1

## 2013-05-08 MED ORDER — LOSARTAN POTASSIUM 50 MG PO TABS
50.0000 mg | ORAL_TABLET | Freq: Every day | ORAL | Status: DC
  Administered 2013-05-08 – 2013-05-11 (×4): 50 mg via ORAL
  Filled 2013-05-08 (×4): qty 1

## 2013-05-08 MED ORDER — DIPHENHYDRAMINE HCL 12.5 MG/5ML PO ELIX
12.5000 mg | ORAL_SOLUTION | ORAL | Status: DC | PRN
  Administered 2013-05-08: 12.5 mg via ORAL
  Filled 2013-05-08: qty 10

## 2013-05-08 MED ORDER — SODIUM CHLORIDE 0.9 % IR SOLN
Status: DC | PRN
  Administered 2013-05-08: 3000 mL

## 2013-05-08 MED ORDER — PHENYLEPHRINE HCL 10 MG/ML IJ SOLN
INTRAMUSCULAR | Status: DC | PRN
  Administered 2013-05-08: 80 ug via INTRAVENOUS

## 2013-05-08 MED ORDER — ROCURONIUM BROMIDE 100 MG/10ML IV SOLN
INTRAVENOUS | Status: DC | PRN
  Administered 2013-05-08: 30 mg via INTRAVENOUS

## 2013-05-08 MED ORDER — GLYCOPYRROLATE 0.2 MG/ML IJ SOLN
INTRAMUSCULAR | Status: DC | PRN
  Administered 2013-05-08: 0.4 mg via INTRAVENOUS

## 2013-05-08 MED ORDER — HYDROMORPHONE HCL PF 1 MG/ML IJ SOLN
INTRAMUSCULAR | Status: DC | PRN
  Administered 2013-05-08 (×2): 0.5 mg via INTRAVENOUS

## 2013-05-08 MED ORDER — OXYCODONE HCL 5 MG/5ML PO SOLN: 5.0000 mg | Freq: Once | ORAL | Status: DC | PRN

## 2013-05-08 MED ORDER — SIMVASTATIN 10 MG PO TABS
10.0000 mg | ORAL_TABLET | Freq: Every day | ORAL | Status: DC
  Administered 2013-05-08 – 2013-05-10 (×3): 10 mg via ORAL
  Filled 2013-05-08 (×4): qty 1

## 2013-05-08 MED ORDER — MIDAZOLAM HCL 5 MG/5ML IJ SOLN
INTRAMUSCULAR | Status: DC | PRN
  Administered 2013-05-08: 1 mg via INTRAVENOUS

## 2013-05-08 MED ORDER — ONDANSETRON HCL 4 MG PO TABS: 4.0000 mg | ORAL_TABLET | Freq: Four times a day (QID) | ORAL | Status: DC | PRN

## 2013-05-08 MED ORDER — DEXAMETHASONE 6 MG PO TABS
10.0000 mg | ORAL_TABLET | Freq: Three times a day (TID) | ORAL | Status: AC
  Administered 2013-05-08 – 2013-05-09 (×2): 10 mg via ORAL
  Filled 2013-05-08 (×3): qty 1

## 2013-05-08 MED ORDER — BUPIVACAINE HCL (PF) 0.25 % IJ SOLN
INTRAMUSCULAR | Status: AC
  Filled 2013-05-08: qty 30

## 2013-05-08 MED ORDER — ALBUTEROL SULFATE (5 MG/ML) 0.5% IN NEBU
INHALATION_SOLUTION | RESPIRATORY_TRACT | Status: AC
  Filled 2013-05-08: qty 0.5

## 2013-05-08 MED ORDER — POTASSIUM CHLORIDE IN NACL 20-0.9 MEQ/L-% IV SOLN
INTRAVENOUS | Status: DC
  Administered 2013-05-09: 09:00:00 via INTRAVENOUS
  Filled 2013-05-08 (×8): qty 1000

## 2013-05-08 MED ORDER — ACETAMINOPHEN 325 MG PO TABS: 650.0000 mg | ORAL_TABLET | Freq: Four times a day (QID) | ORAL | Status: DC | PRN

## 2013-05-08 MED ORDER — ONDANSETRON HCL 4 MG/2ML IJ SOLN: 4.0000 mg | Freq: Four times a day (QID) | INTRAMUSCULAR | Status: DC | PRN

## 2013-05-08 MED ORDER — WARFARIN - PHARMACIST DOSING INPATIENT: Freq: Every day | Status: DC

## 2013-05-08 MED ORDER — LACTATED RINGERS IV SOLN
INTRAVENOUS | Status: DC
  Administered 2013-05-08: 10:00:00 via INTRAVENOUS

## 2013-05-08 MED ORDER — LACTATED RINGERS IV SOLN
INTRAVENOUS | Status: DC | PRN
  Administered 2013-05-08 (×2): via INTRAVENOUS

## 2013-05-08 MED ORDER — ALBUTEROL SULFATE HFA 108 (90 BASE) MCG/ACT IN AERS
INHALATION_SPRAY | RESPIRATORY_TRACT | Status: DC | PRN
  Administered 2013-05-08: 3 via RESPIRATORY_TRACT
  Administered 2013-05-08: 4 via RESPIRATORY_TRACT

## 2013-05-08 SURGICAL SUPPLY — 54 items
BANDAGE ESMARK 6X9 LF (GAUZE/BANDAGES/DRESSINGS) ×1 IMPLANT
BLADE SAG 18X100X1.27 (BLADE) ×4 IMPLANT
BNDG ESMARK 6X9 LF (GAUZE/BANDAGES/DRESSINGS) ×2
BOWL SMART MIX CTS (DISPOSABLE) ×2 IMPLANT
CEMENT BONE SIMPLEX SPEEDSET (Cement) ×2 IMPLANT
CLOTH BEACON ORANGE TIMEOUT ST (SAFETY) ×2 IMPLANT
COVER SURGICAL LIGHT HANDLE (MISCELLANEOUS) ×2 IMPLANT
CUFF TOURNIQUET SINGLE 34IN LL (TOURNIQUET CUFF) ×2 IMPLANT
DRAPE EXTREMITY T 121X128X90 (DRAPE) ×2 IMPLANT
DRAPE PROXIMA HALF (DRAPES) ×2 IMPLANT
DRAPE U-SHAPE 47X51 STRL (DRAPES) ×2 IMPLANT
DRSG PAD ABDOMINAL 8X10 ST (GAUZE/BANDAGES/DRESSINGS) ×2 IMPLANT
DURAPREP 26ML APPLICATOR (WOUND CARE) ×2 IMPLANT
ELECT CAUTERY BLADE 6.4 (BLADE) ×2 IMPLANT
ELECT REM PT RETURN 9FT ADLT (ELECTROSURGICAL) ×2
ELECTRODE REM PT RTRN 9FT ADLT (ELECTROSURGICAL) ×1 IMPLANT
EVACUATOR 1/8 PVC DRAIN (DRAIN) ×2 IMPLANT
FACESHIELD LNG OPTICON STERILE (SAFETY) ×2 IMPLANT
GAUZE XEROFORM 5X9 LF (GAUZE/BANDAGES/DRESSINGS) ×2 IMPLANT
GLOVE BIOGEL PI IND STRL 8 (GLOVE) ×1 IMPLANT
GLOVE BIOGEL PI INDICATOR 8 (GLOVE) ×1
GLOVE ORTHO TXT STRL SZ7.5 (GLOVE) ×2 IMPLANT
GOWN PREVENTION PLUS XLARGE (GOWN DISPOSABLE) ×4 IMPLANT
GOWN STRL NON-REIN LRG LVL3 (GOWN DISPOSABLE) ×4 IMPLANT
GOWN STRL REIN 2XL XLG LVL4 (GOWN DISPOSABLE) ×2 IMPLANT
HANDPIECE INTERPULSE COAX TIP (DISPOSABLE) ×1
IMMOBILIZER KNEE 22 UNIV (SOFTGOODS) ×2 IMPLANT
IMMOBILIZER KNEE 24 THIGH 36 (MISCELLANEOUS) IMPLANT
IMMOBILIZER KNEE 24 UNIV (MISCELLANEOUS)
KIT BASIN OR (CUSTOM PROCEDURE TRAY) ×2 IMPLANT
KIT ROOM TURNOVER OR (KITS) ×2 IMPLANT
MANIFOLD NEPTUNE II (INSTRUMENTS) ×2 IMPLANT
NEEDLE 18GX1X1/2 (RX/OR ONLY) (NEEDLE) ×2 IMPLANT
NEEDLE HYPO 25GX1X1/2 BEV (NEEDLE) ×2 IMPLANT
NS IRRIG 1000ML POUR BTL (IV SOLUTION) ×2 IMPLANT
PACK TOTAL JOINT (CUSTOM PROCEDURE TRAY) ×2 IMPLANT
PAD ARMBOARD 7.5X6 YLW CONV (MISCELLANEOUS) ×4 IMPLANT
PAD CAST 4YDX4 CTTN HI CHSV (CAST SUPPLIES) ×1 IMPLANT
PADDING CAST COTTON 4X4 STRL (CAST SUPPLIES) ×1
PADDING CAST COTTON 6X4 STRL (CAST SUPPLIES) ×2 IMPLANT
RUBBERBAND STERILE (MISCELLANEOUS) ×2 IMPLANT
SET HNDPC FAN SPRY TIP SCT (DISPOSABLE) ×1 IMPLANT
SPONGE GAUZE 4X4 12PLY (GAUZE/BANDAGES/DRESSINGS) ×2 IMPLANT
STAPLER VISISTAT 35W (STAPLE) ×2 IMPLANT
SUCTION FRAZIER TIP 10 FR DISP (SUCTIONS) ×2 IMPLANT
SUT VIC AB 1 CTX 36 (SUTURE) ×2
SUT VIC AB 1 CTX36XBRD ANBCTR (SUTURE) ×2 IMPLANT
SUT VIC AB 2-0 CT1 27 (SUTURE) ×2
SUT VIC AB 2-0 CT1 TAPERPNT 27 (SUTURE) ×2 IMPLANT
SYR CONTROL 10ML LL (SYRINGE) ×2 IMPLANT
TOWEL OR 17X24 6PK STRL BLUE (TOWEL DISPOSABLE) ×2 IMPLANT
TOWEL OR 17X26 10 PK STRL BLUE (TOWEL DISPOSABLE) ×2 IMPLANT
TRAY FOLEY CATH 14FR (SET/KITS/TRAYS/PACK) ×2 IMPLANT
WATER STERILE IRR 1000ML POUR (IV SOLUTION) ×4 IMPLANT

## 2013-05-08 NOTE — H&P (View-Only) (Signed)
TOTAL KNEE ADMISSION H&P  Patient is being admitted for left total knee arthroplasty.  Subjective:  Chief Complaint:left knee pain.  HPI: Summer Jackson, 67 y.o. female, has a history of pain and functional disability in the left knee due to arthritis and has failed non-surgical conservative treatments for greater than 12 weeks to includeNSAID's and/or analgesics, corticosteriod injections, viscosupplementation injections, flexibility and strengthening excercises, supervised PT with diminished ADL's post treatment, use of assistive devices, weight reduction as appropriate and activity modification.  Onset of symptoms was gradual, starting 10 years ago with gradually worsening course since that time. The patient noted prior procedures on the knee to include  arthroscopy and menisectomy on the left knee(s).  Patient currently rates pain in the left knee(s) at 10 out of 10 with activity. Patient has night pain, worsening of pain with activity and weight bearing, pain that interferes with activities of daily living, crepitus and joint swelling.  Patient has evidence of subchondral cysts, periarticular osteophytes and joint space narrowing by imaging studies.There is no active infection.  Patient Active Problem List   Diagnosis Date Noted  . Hypertension   . Hyperlipemia   . Headache   . GERD (gastroesophageal reflux disease)   . COPD (chronic obstructive pulmonary disease)   . Weight loss   . Nasal congestion   . Left shoulder pain 02/05/2013  . Chronic cough 01/24/2013  . Prediabetes 01/10/2013  . Lipoma of abdominal wall 01/23/2012  . Hemorrhoids 01/23/2012  . Rectal bleeding 12/22/2011  . Family history of malignant neoplasm of gastrointestinal tract 12/22/2011  . OSTEOARTHRITIS, KNEE, LEFT 09/10/2009  . COPD 08/25/2009  . GERD 08/25/2009  . SHINGLES, RECURRENT 09/30/2008  . HYPERLIPIDEMIA 08/25/2007  . COMMON MIGRAINE 08/09/2007  . HYPERTENSION 08/09/2007  . CONGENITAL HEART DISEASE,  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   Past Medical History  Diagnosis Date  . Hypertension   . Hyperlipemia   . Headache     migraine  . GERD (gastroesophageal reflux disease)   . Rectal bleeding   . Arthritis   . Blood transfusion   . COPD (chronic obstructive pulmonary disease)   . Weight loss   . Nasal congestion   . Wheezing   . Shortness of breath   . Asthma   . Lipoma     on abd  . Hemorrhoids   . Urinary frequency   . Hypertension   . Hyperlipidemia     Past Surgical History  Procedure Laterality Date  . Heart surgery at 11yr      age 64 -flow through heart not normal-per pt  . Appendectomy    . Colonoscopy    . Left ovary and fallopian tube removed    . Colonoscopy  12/22/2011    Procedure: COLONOSCOPY;  Surgeon: Shirley Friar, MD;  Location: [facility];  Service: Endoscopy;  Laterality: N/A;  . Ovarian tumor    . Breast tumor      left  . Knee arthroscopy      left x2  . Lipoma excision  02/15/2012    Procedure: EXCISION LIPOMA;  Surgeon: Clovis Pu. Cornett, MD;  Location: [facility];  Service: General;  Laterality: Left;  excision lipoma Left Lower abdomen  . Breast surgery      tumor removed from under breast     (Not in a hospital admission) Allergies  Allergen Reactions  . Sulfa Antibiotics Swelling and Rash  . Amoxicillin Other (See Comments)    REACTION: headache  . Azithromycin Other (See  Comments)    REACTION: reddness around mouth  . Penicillins Other (See Comments)    REACTION: headache  . Propoxyphene-Acetaminophen Other (See Comments)    REACTION: mouth sores  . Clarithromycin Rash    REACTION: skin rash with minimal vesicular formation on arms and hands    Current Outpatient Prescriptions on File Prior to Visit  Medication Sig Dispense Refill  . albuterol (PROVENTIL HFA;VENTOLIN HFA) 108 (90 BASE) MCG/ACT inhaler Inhale 2 puffs into the lungs every 6 (six) hours as needed. Shortness of breath      . cetirizine (ZYRTEC) 10 MG tablet  Take 10 mg by mouth every evening.       Marland Kitchen dexlansoprazole (DEXILANT) 60 MG capsule Take 1 capsule (60 mg total) by mouth daily.  30 capsule  2  . doxycycline (VIBRA-TABS) 100 MG tablet Take 1 tablet (100 mg total) by mouth 2 (two) times daily.  20 tablet  0  . famotidine (PEPCID) 20 MG tablet Take 20 mg by mouth 2 (two) times daily as needed. For heartburn      . losartan (COZAAR) 50 MG tablet Take 1 tablet (50 mg total) by mouth daily.  30 tablet  11  . promethazine (PHENERGAN) 12.5 MG tablet Take 1 tablet (12.5 mg total) by mouth every 8 (eight) hours as needed for nausea.  10 tablet  0  . simvastatin (ZOCOR) 10 MG tablet Take 10 mg by mouth at bedtime.      . topiramate (TOPAMAX) 50 MG tablet Take 50 mg by mouth 2 (two) times daily as needed. For headache pain      . traMADol (ULTRAM) 50 MG tablet Take 1 tablet (50 mg total) by mouth every 8 (eight) hours as needed for pain.  30 tablet  0   Current Facility-Administered Medications on File Prior to Visit  Medication Dose Route Frequency Provider Last Rate Last Dose  . ondansetron (ZOFRAN) tablet 8 mg  8 mg Oral Once Elvina Sidle, MD        History  Substance Use Topics  . Smoking status: Former Smoker -- 2.00 packs/day for 35 years    Types: Cigarettes    Quit date: 08/23/2011  . Smokeless tobacco: Never Used  . Alcohol Use: No    Family History  Problem Relation Age of Onset  . Colon cancer Father   . Cancer Father     colon  . Heart disease Father   . Malignant hyperthermia Neg Hx   . Heart disease Paternal Uncle   . Diabetes Sister      Review of Systems  Constitutional: Negative.   Respiratory: Positive for cough and shortness of breath.   Gastrointestinal: Negative.   Genitourinary: Negative.   Musculoskeletal: Positive for joint pain.  Skin: Negative.   Neurological: Negative.   Endo/Heme/Allergies: Negative.   Psychiatric/Behavioral: Negative.     Objective:  Physical Exam  Constitutional: She is  oriented to person, place, and time. She appears well-developed and well-nourished.  HENT:  Head: Normocephalic and atraumatic.  Eyes: Conjunctivae and EOM are normal. Pupils are equal, round, and reactive to light.  Neck: Neck supple.  Cardiovascular: Normal rate and regular rhythm.   Respiratory: Effort normal and breath sounds normal.  GI: Soft. Bowel sounds are normal.  Genitourinary:  Not pertinent to current symptomatology therefore not examined.  Musculoskeletal:  Gait is antalgci no assistive devices  Left knee decreased range of motion. 0-100.   Positive effusion.  Joint line tender.  Positive crepitus.  Ligaments  stable.  Calf non-tender.  Neurovascularly intact.  Skin warm and dry. Right knee has full range of motion 2+ crep. 2+synovitis. 2+ dorsal pedis pulse  Neurological: She is alert and oriented to person, place, and time.  Skin: Skin is warm and dry.    Vital signs in last 24 hours: Last recorded: 05/06 1300   BP: 137/82 Pulse: 84  Temp: 97.6 F (36.4 C)    Height: 5\' 4"  (1.626 m) SpO2: 98  Weight: 85.276 kg (188 lb)     Labs:   Estimated body mass index is 32.25 kg/(m^2) as calculated from the following:   Height as of this encounter: 5\' 4"  (1.626 m).   Weight as of this encounter: 85.276 kg (188 lb).   Imaging Review Plain radiographs demonstrate severe degenerative joint disease of the left knee(s). The overall alignment issignificant varus. The bone quality appears to be good for age and reported activity level.  Assessment/Plan:  End stage arthritis, left knee Patient Active Problem List   Diagnosis Date Noted  . Hypertension   . Hyperlipemia   . Headache   . GERD (gastroesophageal reflux disease)   . COPD (chronic obstructive pulmonary disease)   . Weight loss   . Nasal congestion   . Left shoulder pain 02/05/2013  . Chronic cough 01/24/2013  . Prediabetes 01/10/2013  . Lipoma of abdominal wall 01/23/2012  . Hemorrhoids 01/23/2012  .  Rectal bleeding 12/22/2011  . Family history of malignant neoplasm of gastrointestinal tract 12/22/2011  . OSTEOARTHRITIS, KNEE, LEFT 09/10/2009  . COPD 08/25/2009  . GERD 08/25/2009  . SHINGLES, RECURRENT 09/30/2008  . HYPERLIPIDEMIA 08/25/2007  . COMMON MIGRAINE 08/09/2007  . HYPERTENSION 08/09/2007  . CONGENITAL HEART DISEASE, 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     The patient history, physical examination, clinical judgment of the provider and imaging studies are consistent with end stage degenerative joint disease of the left knee(s) and total knee arthroplasty is deemed medically necessary. The treatment options including medical management, injection therapy arthroscopy and arthroplasty were discussed at length. The risks and benefits of total knee arthroplasty were presented and reviewed. The risks due to aseptic loosening, infection, stiffness, patella tracking problems, thromboembolic complications and other imponderables were discussed. The patient acknowledged the explanation, agreed to proceed with the plan and consent was signed. Patient is being admitted for inpatient treatment for surgery, pain control, PT, OT, prophylactic antibiotics, VTE prophylaxis, progressive ambulation and ADL's and discharge planning. The patient is planning to be discharged to skilled nursing facility [facility]

## 2013-05-08 NOTE — Interval H&P Note (Signed)
History and Physical Interval Note:  05/08/2013 8:23 AM  Summer Jackson  has presented today for surgery, with the diagnosis of DJD LEFT KNEE  The various methods of treatment have been discussed with the patient and family. After consideration of risks, benefits and other options for treatment, the patient has consented to  Procedure(s): TOTAL KNEE ARTHROPLASTY (Left) as a surgical intervention .  The patient's history has been reviewed, patient examined, no change in status, stable for surgery.  I have reviewed the patient's chart and labs.  Questions were answered to the patient's satisfaction.     Michiel Sivley F

## 2013-05-08 NOTE — Progress Notes (Signed)
Utilization Review Completed.   Mileigh Tilley, RN, BSN Nurse Case Manager  [phone]  

## 2013-05-08 NOTE — Anesthesia Preprocedure Evaluation (Signed)
Anesthesia Evaluation  Patient identified by MRN, date of birth, ID band Patient awake    Reviewed: Allergy & Precautions, H&P , NPO status , Patient's Chart, lab work & pertinent test results  Airway Mallampati: II      Dental   Pulmonary shortness of breath and with exertion, asthma , COPD breath sounds clear to auscultation        Cardiovascular hypertension, Rhythm:Regular Rate:Normal     Neuro/Psych    GI/Hepatic Neg liver ROS,   Endo/Other  negative endocrine ROS  Renal/GU      Musculoskeletal   Abdominal   Peds  Hematology   Anesthesia Other Findings   Reproductive/Obstetrics                           Anesthesia Physical Anesthesia Plan  ASA: III  Anesthesia Plan: General   Post-op Pain Management:    Induction: Intravenous  Airway Management Planned: Oral ETT  Additional Equipment:   Intra-op Plan:   Post-operative Plan: Extubation in OR  Informed Consent: I have reviewed the patients History and Physical, chart, labs and discussed the procedure including the risks, benefits and alternatives for the proposed anesthesia with the patient or authorized representative who has indicated his/her understanding and acceptance.   Dental advisory given  Plan Discussed with: CRNA, Anesthesiologist and Surgeon  Anesthesia Plan Comments:         Anesthesia Quick Evaluation

## 2013-05-08 NOTE — Transfer of Care (Signed)
Immediate Anesthesia Transfer of Care Note  Patient: Summer Jackson  Procedure(s) Performed: Procedure(s): TOTAL KNEE ARTHROPLASTY (Left)  Patient Location: PACU  Anesthesia Type:General  Level of Consciousness: awake, alert  and oriented  Airway & Oxygen Therapy: Patient Spontanous Breathing and Patient connected to face mask oxygen  Post-op Assessment: Report given to PACU RN, Post -op Vital signs reviewed and stable and Patient moving all extremities X 4  Post vital signs: Reviewed and stable  Complications: No apparent anesthesia complications

## 2013-05-08 NOTE — Anesthesia Postprocedure Evaluation (Signed)
  Anesthesia Post-op Note  Patient: Summer Jackson  Procedure(s) Performed: Procedure(s): TOTAL KNEE ARTHROPLASTY (Left)  Patient Location: PACU  Anesthesia Type:General  Level of Consciousness: awake, alert  and oriented  Airway and Oxygen Therapy: Patient Spontanous Breathing and Patient connected to nasal cannula oxygen  Post-op Pain: mild  Post-op Assessment: Post-op Vital signs reviewed  Post-op Vital Signs: Reviewed  Complications: No apparent anesthesia complications

## 2013-05-08 NOTE — Anesthesia Procedure Notes (Signed)
Procedure Name: Intubation Date/Time: 05/08/2013 11:13 AM Performed by: Orvilla Fus A Pre-anesthesia Checklist: Patient identified, Emergency Drugs available, Suction available, Patient being monitored and Timeout performed Patient Re-evaluated:Patient Re-evaluated prior to inductionOxygen Delivery Method: Circle system utilized Preoxygenation: Pre-oxygenation with 100% oxygen Intubation Type: IV induction Ventilation: Mask ventilation without difficulty Laryngoscope Size: Mac and 3 Grade View: Grade I Tube type: Oral Tube size: 7.0 mm Number of attempts: 1 Airway Equipment and Method: Stylet Placement Confirmation: ETT inserted through vocal cords under direct vision,  breath sounds checked- equal and bilateral and positive ETCO2 Secured at: 21 cm Tube secured with: Tape Dental Injury: Teeth and Oropharynx as per pre-operative assessment

## 2013-05-08 NOTE — Progress Notes (Signed)
ANTICOAGULATION CONSULT NOTE - Initial Consult  Pharmacy Consult for Coumadin Indication: VTE prophylaxis  Allergies  Allergen Reactions  . Sulfa Antibiotics Swelling and Rash  . Amoxicillin Other (See Comments)    REACTION: headache  . Azithromycin Other (See Comments)    REACTION: reddness around mouth  . Penicillins Other (See Comments)    REACTION: headache  . Propoxyphene-Acetaminophen Other (See Comments)    REACTION: mouth sores  . Clarithromycin Rash    REACTION: skin rash with minimal vesicular formation on arms and hands    Patient Measurements: Height: 5\' 3"  (160 cm) Weight: 185 lb 3 oz (84 kg) IBW/kg (Calculated) : 52.4   Vital Signs: Temp: 97.3 F (36.3 C) (05/21 1445) Temp src: Oral (05/21 0841) BP: 139/68 mmHg (05/21 1550) Pulse Rate: 79 (05/21 1550)  Labs:  INR = 0.87  (preadmit labs on 04/29/13)  Recent Labs  05/08/13 1605  HGB 12.4  HCT 37.0  PLT 234  CREATININE 0.67    Estimated Creatinine Clearance: 72.9 ml/min (by C-G formula based on Cr of 0.67).   Medical History: Past Medical History  Diagnosis Date  . Hypertension   . Hyperlipemia   . Headache     migraine  . GERD (gastroesophageal reflux disease)   . Rectal bleeding   . Arthritis   . Blood transfusion   . COPD (chronic obstructive pulmonary disease)   . Weight loss   . Nasal congestion   . Wheezing   . Asthma   . Lipoma     on abd  . Hemorrhoids   . Urinary frequency   . Hypertension   . Hyperlipidemia   . Shortness of breath     Medications:  Facility-administered medications prior to admission  Medication Dose Route Frequency Provider Last Rate Last Dose  . ondansetron (ZOFRAN) tablet 8 mg  8 mg Oral Once Elvina Sidle, MD       Prescriptions prior to admission  Medication Sig Dispense Refill  . albuterol (PROVENTIL HFA;VENTOLIN HFA) 108 (90 BASE) MCG/ACT inhaler Inhale 2 puffs into the lungs every 6 (six) hours as needed. Shortness of breath      .  cetirizine (ZYRTEC) 10 MG tablet Take 10 mg by mouth every evening.       Marland Kitchen doxycycline (VIBRA-TABS) 100 MG tablet Take 1 tablet (100 mg total) by mouth 2 (two) times daily.  20 tablet  0  . esomeprazole (NEXIUM) 40 MG capsule Take 40 mg by mouth daily before breakfast.      . famotidine (PEPCID) 20 MG tablet Take 20 mg by mouth 2 (two) times daily as needed. For heartburn      . losartan (COZAAR) 50 MG tablet Take 1 tablet (50 mg total) by mouth daily.  30 tablet  11  . simvastatin (ZOCOR) 10 MG tablet Take 10 mg by mouth at bedtime.      . topiramate (TOPAMAX) 50 MG tablet Take 50 mg by mouth 2 (two) times daily as needed. For headache pain       Scheduled:  . dexamethasone  10 mg Oral Q8H   Or  . dexamethasone  10 mg Intravenous Q8H  . docusate sodium  100 mg Oral BID  . doxycycline  100 mg Oral BID  . [START ON 05/09/2013] enoxaparin (LOVENOX) injection  30 mg Subcutaneous Q12H  . HYDROcodone-acetaminophen  1 tablet Oral Q6H  . HYDROmorphone      . loratadine  10 mg Oral Daily  . losartan  50 mg  Oral Daily  . oxyCODONE      . [START ON 05/09/2013] pantoprazole  80 mg Oral Q1200  . simvastatin  10 mg Oral QHS  . vancomycin  1,000 mg Intravenous Q12H    Assessment: 67 y.o female to start coumadin for VTE prophylaxis s/p left TKA.  PMH and PTA medications as noted above.  Baseline/preadmit INR = 0.87, PT =11.8 and PTT = 28 on 04/29/13.   Warfarin predictor point score= 3 points  Goal of Therapy:  INR 2-3 Monitor platelets by anticoagulation protocol: Yes   Plan:  Coumadin 5 mg po tonight x1. PT/INR daily   Noah Delaine, RPh Clinical Pharmacist Pager: [phone] 05/08/2013,7:22 PM

## 2013-05-08 NOTE — Progress Notes (Signed)
Orthopedic Tech Progress Note Patient Details:  Summer Jackson [birth year] [id number]  CPM Left Knee CPM Left Knee: On Left Knee Flexion (Degrees): 0 Left Knee Extension (Degrees): 0 Additional Comments: ohf will be provided when one becomes available;rn notified  Left knee 60 degrees of flexion Summer Jackson 05/08/2013, 1:50 PM

## 2013-05-09 ENCOUNTER — Encounter (HOSPITAL_COMMUNITY): Payer: Self-pay | Admitting: General Practice

## 2013-05-09 ENCOUNTER — Ambulatory Visit: Payer: Medicare Other | Admitting: Emergency Medicine

## 2013-05-09 LAB — CBC
HCT: 35 % — ABNORMAL LOW (ref 36.0–46.0)
Hemoglobin: 11.7 g/dL — ABNORMAL LOW (ref 12.0–15.0)
MCH: 27.7 pg (ref 26.0–34.0)
MCHC: 33.4 g/dL (ref 30.0–36.0)
MCV: 82.7 fL (ref 78.0–100.0)
Platelets: 242 10*3/uL (ref 150–400)
RBC: 4.23 MIL/uL (ref 3.87–5.11)
RDW: 14.2 % (ref 11.5–15.5)
WBC: 10.9 10*3/uL — ABNORMAL HIGH (ref 4.0–10.5)

## 2013-05-09 LAB — PROTIME-INR
INR: 1.02 (ref 0.00–1.49)
Prothrombin Time: 13.3 seconds (ref 11.6–15.2)

## 2013-05-09 LAB — BASIC METABOLIC PANEL
BUN: 14 mg/dL (ref 6–23)
CO2: 22 mEq/L (ref 19–32)
Calcium: 8.7 mg/dL (ref 8.4–10.5)
Chloride: 104 mEq/L (ref 96–112)
Creatinine, Ser: 0.64 mg/dL (ref 0.50–1.10)
GFR calc Af Amer: >90 mL/min (ref >90)
GFR calc non Af Amer: >90 mL/min (ref >90)
Glucose, Bld: 142 mg/dL — ABNORMAL HIGH (ref 70–99)
Potassium: 4.1 mEq/L (ref 3.5–5.1)
Sodium: 140 mEq/L (ref 135–145)

## 2013-05-09 MED ORDER — PNEUMOCOCCAL VAC POLYVALENT 25 MCG/0.5ML IJ INJ
0.5000 mL | INJECTION | INTRAMUSCULAR | Status: AC
  Filled 2013-05-09: qty 0.5

## 2013-05-09 MED ORDER — WARFARIN VIDEO
Freq: Once | Status: AC
  Administered 2013-05-10: 05:00:00

## 2013-05-09 MED ORDER — WARFARIN SODIUM 5 MG PO TABS
5.0000 mg | ORAL_TABLET | Freq: Once | ORAL | Status: AC
  Administered 2013-05-09: 5 mg via ORAL
  Filled 2013-05-09: qty 1

## 2013-05-09 MED ORDER — PATIENT'S GUIDE TO USING COUMADIN BOOK
Freq: Once | Status: AC
  Administered 2013-05-09: 14:00:00
  Filled 2013-05-09: qty 1

## 2013-05-09 NOTE — Clinical Social Work Placement (Addendum)
Clinical Social Work Department  CLINICAL SOCIAL WORK PLACEMENT NOTE  05/09/2013  Patient: Summer Jackson Account Number: [id number] Admit date: 15/21/14  Clinical Social Worker: Sabino Niemann LCSWA Date/time: 05/09/2013 11:30 AM  Clinical Social Work is seeking post-discharge placement for this patient at the following level of care: SKILLED NURSING (*CSW will update this form in Epic as items are completed)  11/25/2012 Patient/family provided with [facility] Department of Clinical Social Work's list of facilities offering this level of care within the geographic area requested by the patient (or if unable, by the patient's family).  12/08/2013Patient/family informed of their freedom to choose among providers that offer the needed level of care, that participate in Medicare, Medicaid or managed care program needed by the patient, have an available bed and are willing to accept the patient.  11/25/2012 Patient/family informed of [facility]' ownership interest in [facility], as well as of the fact that they are under no obligation to receive care at this facility.  PASARR submitted to EDS on 05/10/2013 PASARR number received from EDS on 05/10/2013 FL2 transmitted to all facilities in geographic area requested by pt/family on 11/25/2012  FL2 transmitted to all facilities within larger geographic area on  Patient informed that his/her managed care company has contracts with or will negotiate with certain facilities, including the following:  Patient/family informed of bed offers received: Pre-reg at [name]  Patient chooses bed at [name] Physician recommends and patient chooses bed at  Patient to be transferred to on 5/24 Patient to be transferred to facility by  The following physician request were entered in Epic:  Additional Comments:

## 2013-05-09 NOTE — Evaluation (Signed)
Physical Therapy Evaluation Patient Details Name: Summer Jackson MRN: [id number] DOB: 1946-08-01 Today's Date: 05/09/2013 Time: 0830-0900 PT Time Calculation (min): 30 min  PT Assessment / Plan / Recommendation Clinical Impression  Pt presents after L TKA and will benefit from skilled PT to address deficits in strength, gait, mobility, and ROM to increase functional independence.    PT Assessment  Patient needs continued PT services    Follow Up Recommendations  SNF (pt reports she is going to [facility])    Does the patient have the potential to tolerate intense rehabilitation      Barriers to Discharge None      Equipment Recommendations       Recommendations for Other Services     Frequency 7X/week    Precautions / Restrictions Precautions Precautions: Knee Required Braces or Orthoses: Knee Immobilizer - Left Knee Immobilizer - Left: On when out of bed or walking Restrictions Weight Bearing Restrictions: Yes LLE Weight Bearing: Weight bearing as tolerated   Pertinent Vitals/Pain Pain 10/10, RN made aware, repositioned      Mobility  Bed Mobility Bed Mobility: Supine to Sit Supine to Sit: 4: Min assist Details for Bed Mobility Assistance: assist to move L LE Transfers Transfers: Stand Pivot Transfers Stand Pivot Transfers: 4: Min assist Details for Transfer Assistance: with RW to [facility] and recliner, requires cues for upright posture and safety/sequencing with RW Ambulation/Gait Ambulation/Gait Assistance: 4: Min assist Ambulation Distance (Feet): 5 Feet Assistive device: Rolling walker Ambulation/Gait Assistance Details: min A, cues for posture, step to pattern Wheelchair Mobility Wheelchair Mobility: No    Exercises Total Joint Exercises Ankle Circles/Pumps: AROM;20 reps;Both Quad Sets: AROM;Left;10 reps   PT Diagnosis: Generalized weakness;Difficulty walking;Acute pain  PT Problem List: Decreased activity tolerance;Decreased range of motion;Decreased  strength;Decreased mobility;Decreased balance;Pain PT Treatment Interventions: DME instruction;Gait training;Stair training;Functional mobility training;Therapeutic activities;Patient/family education;Balance training;Therapeutic exercise;Wheelchair mobility training;Modalities   PT Goals Acute Rehab PT Goals PT Goal Formulation: With patient Time For Goal Achievement: 05/16/13 Potential to Achieve Goals: Good Pt will go Supine/Side to Sit: with modified independence Pt will Transfer Bed to Chair/Chair to Bed: with modified independence Pt will Ambulate: 51 - 150 feet;with modified independence Pt will Go Up / Down Stairs: 3-5 stairs;with supervision Pt will Perform Home Exercise Program: Independently  Visit Information  Last PT Received On: 05/09/13 Assistance Needed: +1    Subjective Data  Subjective: I'm in pain Patient Stated Goal: Go home   Prior Functioning  Home Living Lives With: Significant other Available Help at Discharge: Family Type of Home: House Home Access: Stairs to enter Secretary/administrator of Steps: 4-5 Entrance Stairs-Rails: None Home Layout: One level Prior Function Level of Independence: Independent Able to Take Stairs?: Yes    Cognition  Cognition Arousal/Alertness: Awake/alert Behavior During Therapy: WFL for tasks assessed/performed Overall Cognitive Status: Within Functional Limits for tasks assessed    Extremity/Trunk Assessment Right Lower Extremity Assessment RLE ROM/Strength/Tone: Within functional levels RLE Sensation: WFL - Light Touch RLE Coordination: WFL - gross motor Left Lower Extremity Assessment LLE ROM/Strength/Tone: Deficits;Due to pain LLE ROM/Strength/Tone Deficits: Knee ext -10 degrees, knee flexion to 50 degrees AROM LLE Sensation: WFL - Light Touch LLE Coordination: WFL - gross motor Trunk Assessment Trunk Assessment: Normal   Balance Static Standing Balance Static Standing - Balance Support: Left upper extremity  supported;During functional activity Static Standing - Level of Assistance: 4: Min assist Static Standing - Comment/# of Minutes: x 3 minutes for hygiene after toileting  End of Session PT -  End of Session Equipment Utilized During Treatment: Gait belt;Left knee immobilizer Activity Tolerance: Patient tolerated treatment well Patient left: in chair;with call bell/phone within reach Nurse Communication: Mobility status  GP     Summer Jackson 05/09/2013, 10:36 AM

## 2013-05-09 NOTE — Op Note (Signed)
NAME:  AVRIEL, KANDEL NO.:  [id number]  MEDICAL RECORD NO.:  [id number]  LOCATION:  5N21C                        FACILITY:  [facility]  PHYSICIAN:  Loreta Ave, M.D. DATE OF BIRTH:  10-08-1946  DATE OF PROCEDURE:  05/08/2013 DATE OF DISCHARGE:                              OPERATIVE REPORT   PREOPERATIVE DIAGNOSIS:  Left knee end-stage degenerative arthritis, varus alignment.  POSTOPERATIVE DIAGNOSIS:  Left knee end-stage degenerative arthritis, varus alignment.  PROCEDURE: 1. Modified minimally invasive left total knee replacement, Stryker     triathlon prosthesis.  Soft tissue balancing.  Cemented pegged     posterior stabilized. 2. Femoral components, cemented. 3. Tibial component, 9 mm polyethylene insert.  Cemented resurfacing     29 mm patellar component.  SURGEON:  Loreta Ave, M.D.  ASSISTED BY:  Lynder Parents, PA-C, present throughout the entire case and necessary for timely completion of procedure.  ANESTHESIA:  General.  BLOOD LOSS:  Minimal.  SPECIMENS:  None.  CULTURES:  None.  COMPLICATION:  None.  DRESSINGS:  Soft compressive knee immobilizer.  DRAINS:  Hemovac x1.  TOURNIQUET TIME:  45 minutes.  PROCEDURE:  The patient was brought to the operating room, placed on the operating table in supine position.  After adequate anesthesia had been obtained, tourniquet applied.  Prepped and draped in usual sterile fashion.  Exsanguinated with elevation of Esmarch.  Tourniquet inflated to 350 mmHg.  Straight incision above the patella down to the tibial tubercle.  Skin and subcutaneous tissue divided.  Medial arthrotomy, vastus splitting, preserving quad tendon.  Knee exposed.  Remnants of menisci, cruciate ligaments, loose body spurs removed.  Medial capsule release.  Distal femur exposed.  An 8 mm resection 5 degrees of valgus. Using epicondylar axis, the femur was sized, cut, and fitted for posterior stabilized pegged #2  component.  Proximal tibial resection below the defect medially.  Sized to #2 component as well.  Patella exposed.  Posterior 10 mm removed.  Drilled, sized, and fitted for a 29 mm patella.  Trial put in place.  Very pleased by mechanical axis, balancing flexion and extension tracking.  Tibia was marked for rotation and reamed.  All trials removed.  Copious irrigation with a pulse irrigating device.  Cement prepared, placed on all components, firmly seated.  A #2 on the femur, #2 on the tibia, a 9 mm insert.  A 29 mm patellar component.  Once the cement hardened, the knee was irrigated again.  Soft tissues injected with Exparel.  Hemovac was placed. Arthrotomy closed with #1 Vicryl.  Skin and subcutaneous tissue with Vicryl and staples.  Sterile compressive dressing applied.  Tourniquet deflated and removed.  Knee immobilizer applied.  Anesthesia reversed. Brought to the recovery room.  Tolerated surgery well.  No complications.     Loreta Ave, M.D.     DFM/MEDQ  D:  05/08/2013  T:  05/09/2013  Job:  [id number]

## 2013-05-09 NOTE — Progress Notes (Addendum)
ANTICOAGULATION CONSULT NOTE - follow up  Pharmacy Consult for Coumadin Indication: VTE prophylaxis  Allergies  Allergen Reactions  . Sulfa Antibiotics Swelling and Rash  . Amoxicillin Other (See Comments)    REACTION: headache  . Azithromycin Other (See Comments)    REACTION: reddness around mouth  . Penicillins Other (See Comments)    REACTION: headache  . Propoxyphene-Acetaminophen Other (See Comments)    REACTION: mouth sores  . Clarithromycin Rash    REACTION: skin rash with minimal vesicular formation on arms and hands    Patient Measurements: Height: 5\' 3"  (160 cm) Weight: 185 lb 3 oz (84 kg) IBW/kg (Calculated) : 52.4   Vital Signs: Temp: 98.2 F (36.8 C) (05/22 0605) Temp src: Oral (05/22 0605) BP: 125/60 mmHg (05/22 0605) Pulse Rate: 80 (05/22 0605)  Labs:  INR = 0.87  (preadmit labs on 04/29/13)  Recent Labs  05/08/13 1605 05/09/13 0440  HGB 12.4 11.7*  HCT 37.0 35.0*  PLT 234 242  LABPROT  --  13.3  INR  --  1.02  CREATININE 0.67 0.64    Estimated Creatinine Clearance: 72.9 ml/min (by C-G formula based on Cr of 0.64).   Assessment: 67 y.o female on coumadin day # 2 for VTE prophylaxis s/p left TKA.  INR still baseline after first dose of coumadin 5 mg as expected.    She is on doxycycline which can affect INR.  She was on a 10 day course started PTA 04/15/13.  This has been completed and does not need to be continued.  Warfarin predictor point score= 3 points  Goal of Therapy:  INR 2-3 Monitor platelets by anticoagulation protocol: Yes   Plan:  1. Repeat Coumadin 5 mg po tonight x1. 2. On LMWH 30 mg sq q12h per MD 3. PT/INR daily 4. Coumadin book and video for education 5. DC plan is [facility] SN[facility] for rehab 6. I have stopped doxycycline as her 10 day course was completed 04/24/13.  Herby Abraham, Pharm.D. [phone] 05/09/2013 11:03 AM

## 2013-05-09 NOTE — Progress Notes (Signed)
Physical Therapy Treatment Patient Details Name: Summer Jackson MRN: [id number] DOB: 06-12-1946 Today's Date: 05/09/2013 Time: 1040-1105 PT Time Calculation (min): 25 min  PT Assessment / Plan / Recommendation Comments on Treatment Session   Pt improved since eval, able to perform gait and transfers with supervision.  Started written HEP with handout and AROM and AAROM    Follow Up Recommendations  SNF     Does the patient have the potential to tolerate intense rehabilitation     Barriers to Discharge None      Equipment Recommendations       Recommendations for Other Services    Frequency 7X/week   Plan Discharge plan remains appropriate;Frequency remains appropriate    Precautions / Restrictions Precautions Precautions: Knee Required Braces or Orthoses: Knee Immobilizer - Left Knee Immobilizer - Left: On when out of bed or walking Restrictions Weight Bearing Restrictions: Yes LLE Weight Bearing: Weight bearing as tolerated   Pertinent Vitals/Pain Pt c/o 5/10 pain in L knee, repositioned, RN aware    Mobility  Bed Mobility Bed Mobility: Supine to Sit Supine to Sit: 4: Min assist Details for Bed Mobility Assistance: assist to move L LE Transfers Transfers: Editor, commissioning Transfers: 5: Supervision Details for Transfer Assistance: with RW to [facility] and recliner, requires cues for upright posture and safety/sequencing with RW Ambulation/Gait Ambulation/Gait Assistance: 5: Supervision Ambulation Distance (Feet): 20 Feet Assistive device: Rolling walker Ambulation/Gait Assistance Details: step to pattenr, cues for posture Wheelchair Mobility Wheelchair Mobility: No    Exercises Total Joint Exercises Ankle Circles/Pumps: AROM;Both;20 reps Quad Sets: AROM;Left;10 reps Towel Squeeze: AROM;10 reps;Both Short Arc Quad: AAROM;Left;10 reps Heel Slides: AAROM;Left;10 reps Hip ABduction/ADduction: AROM;Left;10 reps   PT Diagnosis: Generalized  weakness;Difficulty walking;Acute pain  PT Problem List: Decreased activity tolerance;Decreased range of motion;Decreased strength;Decreased mobility;Decreased balance;Pain PT Treatment Interventions: DME instruction;Gait training;Stair training;Functional mobility training;Therapeutic activities;Patient/family education;Balance training;Therapeutic exercise;Wheelchair mobility training;Modalities   PT Goals Acute Rehab PT Goals PT Goal Formulation: With patient Time For Goal Achievement: 05/16/13 Potential to Achieve Goals: Good Pt will go Supine/Side to Sit: with modified independence PT Goal: Supine/Side to Sit - Progress: Goal set today Pt will Transfer Bed to Chair/Chair to Bed: with modified independence PT Transfer Goal: Bed to Chair/Chair to Bed - Progress: Goal set today Pt will Ambulate: 51 - 150 feet;with modified independence Pt will Go Up / Down Stairs: 3-5 stairs;with supervision PT Goal: Up/Down Stairs - Progress: Goal set today Pt will Perform Home Exercise Program: Independently PT Goal: Perform Home Exercise Program - Progress: Goal set today  Visit Information  Last PT Received On: 05/09/13 Assistance Needed: +1    Subjective Data  Subjective: I'll do it Patient Stated Goal: Go home   Cognition  Cognition Arousal/Alertness: Awake/alert Behavior During Therapy: WFL for tasks assessed/performed Overall Cognitive Status: Within Functional Limits for tasks assessed    Balance  Static Standing Balance Static Standing - Balance Support: During functional activity Static Standing - Level of Assistance: 5: Stand by assistance Static Standing - Comment/# of Minutes: x 5 minutes for brushing dentures  End of Session PT - End of Session Equipment Utilized During Treatment: Left knee immobilizer Activity Tolerance: Patient tolerated treatment well Patient left: in chair;with call bell/phone within reach;with family/visitor present Nurse Communication: Mobility status    GP     DONAWERTH,KAREN 05/09/2013, 11:09 AM

## 2013-05-09 NOTE — Progress Notes (Signed)
Patient ID: Summer Jackson, female   DOB: 05/23/1946, 67 y.o.   MRN: [id number] Addendum to plan; Patient plans to rehab at [facility]; FL-2 sign by Dr Eulah Pont Hopefully will transfer tomorrow or the following day

## 2013-05-09 NOTE — Progress Notes (Signed)
Occupational Therapy Discharge Patient Details Name: Summer Jackson MRN: [id number] DOB: 03-24-1946 Today's Date: 05/09/2013 Time:  -     Patient discharged from OT services secondary to pt's plan is SNF post D/C from acute and OT eval is not needed since patient's primary insurance is Medicare. OT will defer eval to SNF. Acute OT will sign off.Evette Georges [phone] 05/09/2013, 4:54 PM

## 2013-05-09 NOTE — Progress Notes (Signed)
Clinical Social Work Department  BRIEF PSYCHOSOCIAL ASSESSMENT  Patient:Summer Jackson  Account [id number]  Admit date: 05/08/13 Clinical Social Worker Sabino Niemann, MSW Date/Time: 05/09/2013 Referred by: Physician Date Referred: 05/09/2013 Referred for   SNF Placement   Other Referral:  Interview type: Patient  Other interview type: PSYCHOSOCIAL DATA  Living Status:Family Admitted from facility:  Level of care:  Primary support name: Cates,Henry Primary support relationship to patient: Significant Other Degree of support available:  Fair  CURRENT CONCERNS  Current Concerns   Post-Acute Placement   Other Concerns:  SOCIAL WORK ASSESSMENT / PLAN  CSW met with pt re: PT recommendation for SNF.   Pt lives with her significant other and daughter  CSW explained placement process and answered questions.   Pt reports [facility]  as her preference    CSW completed FL2 and initiated SNF search.     Assessment/plan status: Information/Referral to [facility]  Other assessment/ plan:  Information/referral to community resources:  SNF     PATIENT'S/FAMILY'S RESPONSE TO PLAN OF CARE:  Pt  reports she is agreeable to [facility] SN[facility] in order to increase strength and independence with mobility prior to returning home  Pt verbalized understanding of placement process and appreciation for CSW assist.   Sabino Niemann, MSW [phone]

## 2013-05-09 NOTE — Care Management Note (Unsigned)
    Page 1 of 1   05/09/2013     5:00:45 PM   CARE MANAGEMENT NOTE 05/09/2013  Patient:  Summer Jackson, Summer Jackson   Account Number:  [id number]  Date Initiated:  05/09/2013  Documentation initiated by:  Letha Cape  Subjective/Objective Assessment:   dx tkr  admit- lives with sign other.     Action/Plan:   pt/ot eval- recs snf.   Anticipated DC Date:  05/11/2013   Anticipated DC Plan:  SKILLED NURSING FACILITY  In-house referral  Clinical Social Worker      DC Planning Services  CM consult      Choice offered to / List presented to:             Status of service:  In process, will continue to follow Medicare Important Message given?   (If response is "NO", the following Medicare IM given date fields will be blank) Date Medicare IM given:   Date Additional Medicare IM given:    Discharge Disposition:    Per UR Regulation:    If discussed at Long Length of Stay Meetings, dates discussed:    Comments:  05/09/13 16:59 Letha Cape RN, BSN [phone] patient lives with sign other, per physical therapy recs SNF, CSW referral.

## 2013-05-09 NOTE — Progress Notes (Signed)
Subjective: 1 Day Post-Op Procedure(s) (LRB): TOTAL KNEE ARTHROPLASTY (Left) Patient reports pain as 8 on 0-10 scale.    Objective: Vital signs in last 24 hours: Temp:  [97.3 F (36.3 C)-98.5 F (36.9 C)] 98.1 F (36.7 C) (05/22 1334) Pulse Rate:  [69-96] 79 (05/22 1334) Resp:  [10-16] 15 (05/22 1334) BP: (125-155)/(60-76) 147/72 mmHg (05/22 1334) SpO2:  [96 %-98 %] 98 % (05/22 1334) Weight:  [84 kg (185 lb 3 oz)] 84 kg (185 lb 3 oz) (05/21 1500)  Intake/Output from previous day: 05/21 0701 - 05/22 0700 In: 1420 [P.O.:120; I.V.:1300] Out: 3650 [Urine:3250; Drains:375; Blood:25] Intake/Output this shift: Total I/O In: 720 [P.O.:720] Out: -    Recent Labs  05/08/13 1605 05/09/13 0440  HGB 12.4 11.7*    Recent Labs  05/08/13 1605 05/09/13 0440  WBC 17.4* 10.9*  RBC 4.44 4.23  HCT 37.0 35.0*  PLT 234 242    Recent Labs  05/08/13 1605 05/09/13 0440  NA  --  140  K  --  4.1  CL  --  104  CO2  --  22  BUN  --  14  CREATININE 0.67 0.64  GLUCOSE  --  142*  CALCIUM  --  8.7    Recent Labs  05/09/13 0440  INR 1.02    ABD soft Neurovascular intact Sensation intact distally Dorsiflexion/Plantar flexion intact Compartment soft  Assessment/Plan: 1 Day Post-Op Procedure(s) (LRB): TOTAL KNEE ARTHROPLASTY (Left) Advance diet Up with therapy D/C IV fluids Plan for discharge tomorrow hemovac discontinued without complication  ROBERTS,JANE B 05/09/2013, 1:40 PM

## 2013-05-10 DIAGNOSIS — M79609 Pain in unspecified limb: Secondary | ICD-10-CM

## 2013-05-10 LAB — CBC
HCT: 31.4 % — ABNORMAL LOW (ref 36.0–46.0)
Hemoglobin: 10.4 g/dL — ABNORMAL LOW (ref 12.0–15.0)
MCH: 27.7 pg (ref 26.0–34.0)
MCHC: 33.1 g/dL (ref 30.0–36.0)
MCV: 83.7 fL (ref 78.0–100.0)
Platelets: 227 10*3/uL (ref 150–400)
RBC: 3.75 MIL/uL — ABNORMAL LOW (ref 3.87–5.11)
RDW: 14.6 % (ref 11.5–15.5)
WBC: 14.4 10*3/uL — ABNORMAL HIGH (ref 4.0–10.5)

## 2013-05-10 LAB — BASIC METABOLIC PANEL
BUN: 14 mg/dL (ref 6–23)
CO2: 25 mEq/L (ref 19–32)
Calcium: 8.5 mg/dL (ref 8.4–10.5)
Chloride: 105 mEq/L (ref 96–112)
Creatinine, Ser: 0.74 mg/dL (ref 0.50–1.10)
GFR calc Af Amer: >90 mL/min (ref >90)
GFR calc non Af Amer: 88 mL/min — ABNORMAL LOW (ref >90)
Glucose, Bld: 114 mg/dL — ABNORMAL HIGH (ref 70–99)
Potassium: 4 mEq/L (ref 3.5–5.1)
Sodium: 139 mEq/L (ref 135–145)

## 2013-05-10 LAB — PROTIME-INR
INR: 2.09 — ABNORMAL HIGH (ref 0.00–1.49)
Prothrombin Time: 22.6 seconds — ABNORMAL HIGH (ref 11.6–15.2)

## 2013-05-10 MED ORDER — WARFARIN SODIUM 1 MG PO TABS
1.0000 mg | ORAL_TABLET | Freq: Once | ORAL | Status: AC
  Administered 2013-05-10: 1 mg via ORAL
  Filled 2013-05-10: qty 1

## 2013-05-10 MED ORDER — DSS 100 MG PO CAPS: 100.0000 mg | ORAL_CAPSULE | Freq: Two times a day (BID) | ORAL | Status: DC

## 2013-05-10 MED ORDER — OXYCODONE HCL 5 MG PO TABS: 5.0000 mg | ORAL_TABLET | ORAL | Status: DC | PRN

## 2013-05-10 MED ORDER — WARFARIN SODIUM 5 MG PO TABS: 5.0000 mg | ORAL_TABLET | Freq: Every day | ORAL | Status: DC

## 2013-05-10 MED ORDER — BISACODYL 10 MG RE SUPP: 10.0000 mg | Freq: Every day | RECTAL | Status: DC | PRN

## 2013-05-10 NOTE — Care Management Note (Signed)
CARE MANAGEMENT NOTE 05/10/2013  Patient:  Summer Jackson,Summer Jackson   Account Number:  [id number]  Date Initiated:  05/10/2013  Documentation initiated by:  Harlie Buening  Subjective/Objective Assessment:   67 yr old female s/p right total shoulder arthroplasty.     Action/Plan:   CM spoke with patient and her son concerning home health needs at discharge. Son will stay with patient until next week wed. Friends will assist also.Wil need PT for safety eval and NA for bathing.  Choice offered.   Anticipated DC Date:  05/11/2013   Anticipated DC Plan:  HOME W HOME HEALTH SERVICES      DC Planning Services  CM consult      PAC Choice  HOME HEALTH   Choice offered to / List presented to:  C-1 Patient        HH arranged  HH-2 PT  HH-4 NURSE'S AIDE      HH agency  [facility]   Status of service:  Completed, signed off Medicare Important Message given?   (If response is "NO", the following Medicare IM given date fields will be blank) Date Medicare IM given:   Date Additional Medicare IM given:    Discharge Disposition:  HOME W HOME HEALTH SERVICES  Per UR Regulation:    If discussed at Long Length of Stay Meetings, dates discussed:    Comments:    

## 2013-05-10 NOTE — Progress Notes (Signed)
ANTICOAGULATION CONSULT NOTE - follow up  Pharmacy Consult for Coumadin Indication: VTE prophylaxis  Allergies  Allergen Reactions  . Sulfa Antibiotics Swelling and Rash  . Amoxicillin Other (See Comments)    REACTION: headache  . Azithromycin Other (See Comments)    REACTION: reddness around mouth  . Penicillins Other (See Comments)    REACTION: headache  . Propoxyphene-Acetaminophen Other (See Comments)    REACTION: mouth sores  . Clarithromycin Rash    REACTION: skin rash with minimal vesicular formation on arms and hands    Patient Measurements: Height: 5\' 3"  (160 cm) Weight: 185 lb 3 oz (84 kg) IBW/kg (Calculated) : 52.4   Vital Signs: Temp: 98 F (36.7 C) (05/23 0525) Temp src: Oral (05/23 0525) BP: 133/65 mmHg (05/23 0525) Pulse Rate: 77 (05/23 0525)  Labs:  INR = 0.87  (preadmit labs on 04/29/13)  Recent Labs  05/08/13 1605 05/09/13 0440 05/10/13 0604  HGB 12.4 11.7* 10.4*  HCT 37.0 35.0* 31.4*  PLT 234 242 227  LABPROT  --  13.3 22.6*  INR  --  1.02 2.09*  CREATININE 0.67 0.64 0.74    Estimated Creatinine Clearance: 72.9 ml/min (by C-G formula based on Cr of 0.74).   Assessment: 67 y.o female on coumadin day # 3 for VTE prophylaxis s/p left TKA.  INR jumped up to 2.09 afater 2 doses of 5 mg.  Huge 9.3 second jump in protime.  No bleeding reported.  Got LMWH 30 mg this am - will stop now that INR > 1.8 per MD orders.  No bleeding reported.   Warfarin predictor point score= 3 points  Goal of Therapy:  INR 2-3 Monitor platelets by anticoagulation protocol: Yes   Plan:  1. Coumadin 1 mg po tonight x1. 2. I stopped LMWH 30 mg sq q12h 2nd to INR > 1.8 per MD orders 3. PT/INR daily 4. Coumadin book and video for education completed and documented by RN 5. DC plan is [facility] for rehab 6. I  stopped doxycycline as her 10 day course was completed 04/24/13.  Herby Abraham, Pharm.D. [phone] 05/10/2013 11:32 AM

## 2013-05-10 NOTE — Progress Notes (Signed)
Subjective: 2 Days Post-Op Procedure(s) (LRB): TOTAL KNEE ARTHROPLASTY (Left) Patient reports pain as 7 on 0-10 scale.    Objective: Vital signs in last 24 hours: Temp:  [97.9 F (36.6 C)-98.1 F (36.7 C)] 98 F (36.7 C) (05/23 0525) Pulse Rate:  [77-80] 77 (05/23 0525) Resp:  [15-20] 18 (05/23 0525) BP: (133-147)/(64-72) 133/65 mmHg (05/23 0525) SpO2:  [97 %-98 %] 97 % (05/23 0525)  Intake/Output from previous day: 05/22 0701 - 05/23 0700 In: 840 [P.O.:840] Out: 600 [Urine:600] Intake/Output this shift:     Recent Labs  05/08/13 1605 05/09/13 0440 05/10/13 0604  HGB 12.4 11.7* 10.4*    Recent Labs  05/09/13 0440 05/10/13 0604  WBC 10.9* 14.4*  RBC 4.23 3.75*  HCT 35.0* 31.4*  PLT 242 227    Recent Labs  05/09/13 0440 05/10/13 0604  NA 140 139  K 4.1 4.0  CL 104 105  CO2 22 25  BUN 14 14  CREATININE 0.64 0.74  GLUCOSE 142* 114*  CALCIUM 8.7 8.5    Recent Labs  05/09/13 0440 05/10/13 0604  INR 1.02 2.09*    Neurovascular intact Incision: scant drainage Compartment soft but tender  Assessment/Plan: 2 Days Post-Op Procedure(s) (LRB): TOTAL KNEE ARTHROPLASTY (Left) Up with therapy Discharge to SNF when bed available Will check leg with venous doppler due to increasing pain throughout leg  ROBERTS,JANE B 05/10/2013, 8:20 AM

## 2013-05-10 NOTE — Progress Notes (Signed)
*  Preliminary Results* Left lower extremity venous duplex completed. Visualized veins of the left lower extremity are negative for deep vein thrombosis.   05/10/2013 10:09 AM Gertie Fey, RDMS, RDCS

## 2013-05-10 NOTE — Discharge Summary (Signed)
Physician Discharge Summary  Patient ID: Summer Jackson MRN: [id number] DOB/AGE: 65-Jun-1947 85 y.o.  Admit date: 05/08/2013 Discharge date: 05/10/2013  Admission Diagnoses:  Discharge Diagnoses:  Principal Problem:   OSTEOARTHRITIS, KNEE, LEFT   Discharged Condition: good  Hospital Course: Patient admitted on date of surgery for left total knee arthroplasty. Perioperative antibiotics, post operative dvt prophylaxis, PT for WBAT ambulation with uneventful hospital course.  Consults: None  Significant Diagnostic Studies: labs: post operative blood levels; venous doppler negative left leg  Treatments: antibiotics:vancomycin, anticoagulation: LMW heparin and warfarin and surgery: left total knee arthroplasty  Discharge Exam: Blood pressure 139/70, pulse 83, temperature 98 F (36.7 C), temperature source Oral, resp. rate 18, height 5\' 3"  (1.6 m), weight 84 kg (185 lb 3 oz), SpO2 98.00%. Incision/Wound:clean and dry no sign of infection  Disposition: 01-Home or Self Care  Discharge Orders   Future Orders Complete By Expires     CPM  As directed     Comments:      CPM 0-60 for 6 hrs a day.  Can do in 2 hour increments.  Increase by 10 degrees a day until 0-90.    Call MD / Call 911  As directed     Comments:      If you experience chest pain or shortness of breath, CALL 911 and be transported to the hospital emergency room.  If you develope a fever above 101 F, pus (white drainage) or increased drainage or redness at the wound, or calf pain, call your surgeon's office.    Change dressing  As directed     Comments:      Change the dressing daily with sterile 4 x 4 inch gauze dressing and apply TED hose.  You may clean the incision with alcohol prior to redressing.    Constipation Prevention  As directed     Comments:      Drink plenty of fluids.  Prune juice may be helpful.  You may use a stool softener, such as Colace (over the counter) 100 mg twice a day.  Use MiraLax (over the  counter) for constipation as needed.    Diet - low sodium heart healthy  As directed     Discharge instructions  As directed     Comments:      Keep knee immobilizer on at all times except cpm May shower post op day 5 Continue PT for wbat ambulation and modalities    Do not put a pillow under the knee. Place it under the heel.  As directed     Comments:      Place yellow block under heel at all times except when up walking or in CPM.  You must sleep in it at night    Increase activity slowly as tolerated  As directed     TED hose  As directed     Comments:      Use stockings (TED hose) for 2 weeks on both leg(s).  You may remove them at night for sleeping.        Medication List    STOP taking these medications       doxycycline 100 MG tablet  Commonly known as:  VIBRA-TABS      TAKE these medications       albuterol 108 (90 BASE) MCG/ACT inhaler  Commonly known as:  PROVENTIL HFA;VENTOLIN HFA  Inhale 2 puffs into the lungs every 6 (six) hours as needed. Shortness of breath     bisacodyl  10 MG suppository  Commonly known as:  DULCOLAX  Place 1 suppository (10 mg total) rectally daily as needed.     cetirizine 10 MG tablet  Commonly known as:  ZYRTEC  Take 10 mg by mouth every evening.     DSS 100 MG Caps  Take 100 mg by mouth 2 (two) times daily.     esomeprazole 40 MG capsule  Commonly known as:  NEXIUM  Take 40 mg by mouth daily before breakfast.     famotidine 20 MG tablet  Commonly known as:  PEPCID  Take 20 mg by mouth 2 (two) times daily as needed. For heartburn     losartan 50 MG tablet  Commonly known as:  COZAAR  Take 1 tablet (50 mg total) by mouth daily.     oxyCODONE 5 MG immediate release tablet  Commonly known as:  Oxy IR/ROXICODONE  Take 1-2 tablets (5-10 mg total) by mouth every 4 (four) hours as needed.     simvastatin 10 MG tablet  Commonly known as:  ZOCOR  Take 10 mg by mouth at bedtime.     topiramate 50 MG tablet  Commonly known as:   TOPAMAX  Take 50 mg by mouth 2 (two) times daily as needed. For headache pain     warfarin 5 MG tablet  Commonly known as:  COUMADIN  Take 1 tablet (5 mg total) by mouth daily. Unless instructed otherwise by pharmacy managing your treatment. continue for 4 weeks post operatively unless instructed otherwise           Follow-up Information   Follow up with [facility], MD. Schedule an appointment as soon as possible for a visit in 11 days.   Contact information:   [address] [place] [address] [phone]       Signed: Clarene Critchley 05/10/2013, 2:22 PM

## 2013-05-10 NOTE — Progress Notes (Signed)
Physical Therapy Treatment Patient Details Name: Summer Jackson MRN: [id number] DOB: June 04, 1946 Today's Date: 05/10/2013 Time: 4098-1191 PT Time Calculation (min): 25 min  PT Assessment / Plan / Recommendation Comments on Treatment Session  Pt currently supervision level with gait and mobility.  Pain limits activity tolerance for all functional and therapeutic activities.    Follow Up Recommendations  SNF     Does the patient have the potential to tolerate intense rehabilitation     Barriers to Discharge        Equipment Recommendations       Recommendations for Other Services    Frequency 7X/week   Plan Discharge plan remains appropriate;Frequency remains appropriate    Precautions / Restrictions Precautions Precautions: Knee Required Braces or Orthoses: Knee Immobilizer - Left Knee Immobilizer - Left: On when out of bed or walking Restrictions LLE Weight Bearing: Weight bearing as tolerated   Pertinent Vitals/Pain Pt c/o 8/10 pain L knee, RN made aware.    Mobility  Bed Mobility Supine to Sit: 5: Supervision Transfers Transfers: Sit to Stand Sit to Stand: 5: Supervision Stand Pivot Transfers: 5: Supervision Details for Transfer Assistance: cues for UE/LE placement Ambulation/Gait Ambulation/Gait Assistance: 5: Supervision Ambulation Distance (Feet): 125 Feet Assistive device: Rolling walker Ambulation/Gait Assistance Details: pt with step to pattern, forward flexed posture.  Able to improve to step through pattern with cues but returns to step to pattern when fatigued, unable to correct fwd flexed posture due to pain    Exercises     PT Diagnosis:    PT Problem List:   PT Treatment Interventions:     PT Goals Acute Rehab PT Goals PT Goal: Supine/Side to Sit - Progress: Progressing toward goal PT Transfer Goal: Bed to Chair/Chair to Bed - Progress: Progressing toward goal PT Goal: Ambulate - Progress: Progressing toward goal  Visit Information  Last PT  Received On: 05/10/13 Assistance Needed: +1    Subjective Data  Subjective: I didn't sleep all night   Cognition       Balance  Static Standing Balance Static Standing - Balance Support: Right upper extremity supported;During functional activity Static Standing - Level of Assistance: 5: Stand by assistance Static Standing - Comment/# of Minutes: Pt able to stand to wash up with supervision, standing tolerance limited by pt with increased fatigue today due to not sleeping well last night  End of Session PT - End of Session Equipment Utilized During Treatment: Left knee immobilizer Activity Tolerance: Patient tolerated treatment well Patient left: in bed;with call bell/phone within reach   GP     [facility] 05/10/2013, 11:14 AM

## 2013-05-11 LAB — BASIC METABOLIC PANEL
BUN: 14 mg/dL (ref 6–23)
CO2: 24 mEq/L (ref 19–32)
Calcium: 8.6 mg/dL (ref 8.4–10.5)
Chloride: 101 mEq/L (ref 96–112)
Creatinine, Ser: 0.72 mg/dL (ref 0.50–1.10)
GFR calc Af Amer: >90 mL/min (ref >90)
GFR calc non Af Amer: 89 mL/min — ABNORMAL LOW (ref >90)
Glucose, Bld: 133 mg/dL — ABNORMAL HIGH (ref 70–99)
Potassium: 3.9 mEq/L (ref 3.5–5.1)
Sodium: 138 mEq/L (ref 135–145)

## 2013-05-11 LAB — CBC
HCT: 32.6 % — ABNORMAL LOW (ref 36.0–46.0)
Hemoglobin: 10.9 g/dL — ABNORMAL LOW (ref 12.0–15.0)
MCH: 28.1 pg (ref 26.0–34.0)
MCHC: 33.4 g/dL (ref 30.0–36.0)
MCV: 84 fL (ref 78.0–100.0)
Platelets: 207 10*3/uL (ref 150–400)
RBC: 3.88 MIL/uL (ref 3.87–5.11)
RDW: 15 % (ref 11.5–15.5)
WBC: 10.8 10*3/uL — ABNORMAL HIGH (ref 4.0–10.5)

## 2013-05-11 LAB — PROTIME-INR
INR: 2.08 — ABNORMAL HIGH (ref 0.00–1.49)
Prothrombin Time: 22.5 seconds — ABNORMAL HIGH (ref 11.6–15.2)

## 2013-05-11 MED ORDER — WARFARIN SODIUM 5 MG PO TABS
5.0000 mg | ORAL_TABLET | Freq: Every day | ORAL | Status: DC
  Filled 2013-05-11: qty 1

## 2013-05-11 NOTE — Progress Notes (Signed)
ANTICOAGULATION CONSULT NOTE - follow up  Pharmacy Consult for Coumadin Indication: VTE prophylaxis  Allergies  Allergen Reactions  . Sulfa Antibiotics Swelling and Rash  . Amoxicillin Other (See Comments)    REACTION: headache  . Azithromycin Other (See Comments)    REACTION: reddness around mouth  . Penicillins Other (See Comments)    REACTION: headache  . Propoxyphene-Acetaminophen Other (See Comments)    REACTION: mouth sores  . Clarithromycin Rash    REACTION: skin rash with minimal vesicular formation on arms and hands    Patient Measurements: Height: 5\' 3"  (160 cm) Weight: 185 lb 3 oz (84 kg) IBW/kg (Calculated) : 52.4   Vital Signs: Temp: 98.5 F (36.9 C) (05/24 0519) Temp src: Oral (05/24 0519) BP: 138/77 mmHg (05/24 0519) Pulse Rate: 78 (05/24 0519)  Labs:  INR = 0.87  (preadmit labs on 04/29/13)  Recent Labs  05/09/13 0440 05/10/13 0604 05/11/13 0446  HGB 11.7* 10.4* 10.9*  HCT 35.0* 31.4* 32.6*  PLT 242 227 207  LABPROT 13.3 22.6* 22.5*  INR 1.02 2.09* 2.08*  CREATININE 0.64 0.74 0.72    Estimated Creatinine Clearance: 72.9 ml/min (by C-G formula based on Cr of 0.72).   Assessment: 67 y.o female on coumadin day # 4 for VTE prophylaxis s/p left TKA.  INR 2.08 afater 2 doses of 5 mg and 1 mg dose yesterday.  INR tx.   No bleeding reported.  Warfarin predictor point score= 3 points  Goal of Therapy:  INR 2-3 Monitor platelets by anticoagulation protocol: Yes   Plan:  1. She has order to DC to [facility] on coumadin 5 mg po daily with dose to be adjusted for INR 2-3  Herby Abraham, Pharm.D. [phone] 05/11/2013 12:10 PM

## 2013-05-11 NOTE — Progress Notes (Signed)
Physical Therapy Treatment Patient Details Name: Summer Jackson MRN: [id number] DOB: September 21, 1946 Today's Date: 05/11/2013 Time: 7829-5621 PT Time Calculation (min): 24 min  PT Assessment / Plan / Recommendation Comments on Treatment Session  Pt. progressing steadily but is still limited by pain.  Needs encouragement to do all she can for herself.    Follow Up Recommendations  SNF     Does the patient have the potential to tolerate intense rehabilitation     Barriers to Discharge        Equipment Recommendations       Recommendations for Other Services    Frequency 7X/week   Plan Discharge plan remains appropriate;Frequency remains appropriate    Precautions / Restrictions Precautions Precautions: Knee Required Braces or Orthoses: Knee Immobilizer - Left Knee Immobilizer - Left: On when out of bed or walking Restrictions Weight Bearing Restrictions: Yes LLE Weight Bearing: Weight bearing as tolerated   Pertinent Vitals/Pain See vitals tab     Mobility  Bed Mobility Bed Mobility: Sit to Supine Supine to Sit: 4: Min assist Details for Bed Mobility Assistance: assist for L LE to edge of bed Transfers Transfers: Sit to Stand;Stand to Sit Sit to Stand: 4: Min guard Stand to Sit: 4: Min guard Details for Transfer Assistance: cues for technique and safety Ambulation/Gait Ambulation/Gait Assistance: 4: Min guard Ambulation Distance (Feet): 120 Feet Assistive device: Rolling walker Ambulation/Gait Assistance Details: cues for erect stance and symmetrical weight shifting Gait Pattern: Step-through pattern;Trunk flexed;Antalgic Gait velocity: decreased Stairs: No    Exercises Total Joint Exercises Ankle Circles/Pumps: AROM;Both;20 reps Quad Sets: AROM;Left;10 reps Short Arc QuadBarbaraann Boys;Left;10 reps Knee Flexion: AAROM;Left;5 reps;Seated Goniometric ROM: 0-55   PT Diagnosis:    PT Problem List:   PT Treatment Interventions:     PT Goals Acute Rehab PT Goals Pt  will go Supine/Side to Sit: with modified independence PT Goal: Supine/Side to Sit - Progress: Progressing toward goal Pt will Ambulate: 51 - 150 feet;with modified independence PT Goal: Ambulate - Progress: Progressing toward goal Pt will Perform Home Exercise Program: Independently PT Goal: Perform Home Exercise Program - Progress: Progressing toward goal  Visit Information  Last PT Received On: 05/11/13 Assistance Needed: +1    Subjective Data  Subjective: I think I am going to [employer] today   Cognition  Cognition Arousal/Alertness: Awake/alert Behavior During Therapy: WFL for tasks assessed/performed Overall Cognitive Status: Within Functional Limits for tasks assessed    Balance     End of Session PT - End of Session Equipment Utilized During Treatment: Left knee immobilizer Activity Tolerance: Patient tolerated treatment well Patient left: in chair;with call bell/phone within reach Nurse Communication: Mobility status   GP     Ferman Hamming 05/11/2013, 9:19 AM Weldon Picking PT Acute Rehab Services [phone] Bee[facility] [phone]

## 2013-05-11 NOTE — Progress Notes (Signed)
Subjective: 3 Days Post-Op Procedure(s) (LRB): TOTAL KNEE ARTHROPLASTY (Left) Patient reports pain as 4 on 0-10 scale. Had bowel movement last night  Venous doppler negative   Objective: Vital signs in last 24 hours: Temp:  [98 F (36.7 C)-98.5 F (36.9 C)] 98.5 F (36.9 C) (05/24 0519) Pulse Rate:  [78-88] 78 (05/24 0519) Resp:  [18] 18 (05/24 0519) BP: (138-147)/(70-77) 138/77 mmHg (05/24 0519) SpO2:  [97 %-98 %] 97 % (05/24 0519)  Intake/Output from previous day: 05/23 0701 - 05/24 0700 In: 960 [P.O.:960] Out: -  Intake/Output this shift: Total I/O In: 480 [P.O.:480] Out: -    Recent Labs  05/08/13 1605 05/09/13 0440 05/10/13 0604 05/11/13 0446  HGB 12.4 11.7* 10.4* 10.9*    Recent Labs  05/10/13 0604 05/11/13 0446  WBC 14.4* 10.8*  RBC 3.75* 3.88  HCT 31.4* 32.6*  PLT 227 207    Recent Labs  05/10/13 0604 05/11/13 0446  NA 139 138  K 4.0 3.9  CL 105 101  CO2 25 24  BUN 14 14  CREATININE 0.74 0.72  GLUCOSE 114* 133*  CALCIUM 8.5 8.6    Recent Labs  05/10/13 0604 05/11/13 0446  INR 2.09* 2.08*    ABD soft Neurovascular intact Dorsiflexion/Plantar flexion intact Incision: no drainage Compartment soft  Assessment/Plan: 3 Days Post-Op Procedure(s) (LRB): TOTAL KNEE ARTHROPLASTY (Left) Up with therapy Discharge to SNF  ROBERTS,JANE B 05/11/2013, 9:21 AM

## 2013-05-15 ENCOUNTER — Non-Acute Institutional Stay (SKILLED_NURSING_FACILITY): Payer: Medicare Other | Admitting: Internal Medicine

## 2013-05-15 DIAGNOSIS — IMO0002 Reserved for concepts with insufficient information to code with codable children: Secondary | ICD-10-CM

## 2013-05-15 DIAGNOSIS — J449 Chronic obstructive pulmonary disease, unspecified: Secondary | ICD-10-CM

## 2013-05-15 DIAGNOSIS — M25562 Pain in left knee: Secondary | ICD-10-CM

## 2013-05-15 DIAGNOSIS — M1710 Unilateral primary osteoarthritis, unspecified knee: Secondary | ICD-10-CM

## 2013-05-15 DIAGNOSIS — M25569 Pain in unspecified knee: Secondary | ICD-10-CM

## 2013-05-15 DIAGNOSIS — I10 Essential (primary) hypertension: Secondary | ICD-10-CM

## 2013-05-29 ENCOUNTER — Encounter: Payer: Self-pay | Admitting: Adult Health

## 2013-05-29 ENCOUNTER — Non-Acute Institutional Stay (SKILLED_NURSING_FACILITY): Payer: 59 | Admitting: Adult Health

## 2013-05-29 DIAGNOSIS — J449 Chronic obstructive pulmonary disease, unspecified: Secondary | ICD-10-CM

## 2013-05-29 DIAGNOSIS — Z9109 Other allergy status, other than to drugs and biological substances: Secondary | ICD-10-CM | POA: Insufficient documentation

## 2013-05-29 DIAGNOSIS — K5900 Constipation, unspecified: Secondary | ICD-10-CM

## 2013-05-29 DIAGNOSIS — IMO0002 Reserved for concepts with insufficient information to code with codable children: Secondary | ICD-10-CM

## 2013-05-29 DIAGNOSIS — M1710 Unilateral primary osteoarthritis, unspecified knee: Secondary | ICD-10-CM

## 2013-05-29 DIAGNOSIS — I10 Essential (primary) hypertension: Secondary | ICD-10-CM

## 2013-05-29 DIAGNOSIS — J301 Allergic rhinitis due to pollen: Secondary | ICD-10-CM

## 2013-05-29 DIAGNOSIS — E785 Hyperlipidemia, unspecified: Secondary | ICD-10-CM

## 2013-05-29 DIAGNOSIS — K219 Gastro-esophageal reflux disease without esophagitis: Secondary | ICD-10-CM

## 2013-05-29 HISTORY — DX: Constipation, unspecified: K59.00

## 2013-05-29 HISTORY — DX: Other allergy status, other than to drugs and biological substances: Z91.09

## 2013-05-29 NOTE — Progress Notes (Signed)
  Subjective:    Patient ID: Summer Jackson, female    DOB: 1946-05-18, 67 y.o.   MRN: [id number]  HPI This is a 67 year old female who is for discharge home with outpatient rehabilitation. DME: cane and 3-in-1 due to unsteady gait. She has been admitted to [facility] on 05/11/[facility] with Osteoarthritis S/P left total knee replacement. She has been admitted for a short-term rehabilitation.   Review of Systems  Constitutional: Negative.   HENT: Negative.   Eyes: Negative.   Respiratory: Negative for cough and shortness of breath.   Cardiovascular: Negative for leg swelling.  Gastrointestinal: Negative.   Endocrine: Negative.   Genitourinary: Negative.   Neurological: Negative.   Hematological: Negative for adenopathy. Does not bruise/bleed easily.  Psychiatric/Behavioral: Negative.        Objective:   Physical Exam  Nursing note and vitals reviewed. Constitutional: She is oriented to person, place, and time. She appears well-developed and well-nourished.  HENT:  Head: Normocephalic and atraumatic.  Right Ear: External ear normal.  Left Ear: External ear normal.  Eyes: Conjunctivae are normal. Pupils are equal, round, and reactive to light.  Neck: Normal range of motion. Neck supple.  Cardiovascular: Normal rate, regular rhythm and normal heart sounds.   Pulmonary/Chest: Effort normal and breath sounds normal.  Abdominal: Soft. Bowel sounds are normal.  Musculoskeletal: She exhibits no edema and no tenderness.  Neurological: She is alert and oriented to person, place, and time.  Skin: Skin is warm and dry.  Psychiatric: She has a normal mood and affect. Her behavior is normal. Judgment and thought content normal.   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

## 2013-06-06 NOTE — Progress Notes (Signed)
Patient ID: Summer Jackson, female   DOB: 1946-02-21, 67 y.o.   MRN: [id number]        HISTORY & PHYSICAL  DATE: 05/15/2013   FACILITY: [facility]  LEVEL OF CARE: SNF (31)  ALLERGIES:  Allergies  Allergen Reactions  . Sulfa Antibiotics Swelling and Rash  . Amoxicillin Other (See Comments)    REACTION: headache  . Azithromycin Other (See Comments)    REACTION: reddness around mouth  . Penicillins Other (See Comments)    REACTION: headache  . Propoxyphene-Acetaminophen Other (See Comments)    REACTION: mouth sores  . Clarithromycin Rash    REACTION: skin rash with minimal vesicular formation on arms and hands    CHIEF COMPLAINT:  Manage left knee osteoarthritis, hypertension, and COPD.    HISTORY OF PRESENT ILLNESS:  The patient is a 67 year-old, Caucasian female.    KNEE OSTEOARTHRITIS: Patient had a history of pain and functional disability in the knee due to end-stage osteoarthritis and has failed nonsurgical conservative treatments. Patient had worsening of pain with activity and weight bearing, pain that interfered with activities of daily living & pain with passive range of motion. Therefore patient underwent total knee arthroplasty and tolerated the procedure well. Patient is admitted to this facility for sort short-term rehabilitation. Patient is complaining of uncontrolled left knee pain.    HTN: Pt 's HTN remains stable.  Denies CP, sob, DOE, pedal edema, headaches, dizziness or visual disturbances.  No complications from the medications currently being used.  Last BP :  124/69, 136/68, 130/77.  COPD: the COPD remains stable.  Pt denies sob, cough, wheezing or declining exercise tolerance.  No complications from the medications presently being used.   PAST MEDICAL HISTORY :  Past Medical History  Diagnosis Date  . Hypertension   . Hyperlipemia   . GERD (gastroesophageal reflux disease)   . Rectal bleeding   . Blood transfusion ~ 1955    "before  heart OR" (05/09/2013)  . COPD (chronic obstructive pulmonary disease)   . Weight loss   . Nasal congestion   . Wheezing   . Asthma   . Hemorrhoids   . Urinary frequency   . Hypertension   . Exertional shortness of breath   . History of stomach ulcers 1990's  . Migraines   . Arthritis     "knees" (05/09/2013)    PAST SURGICAL HISTORY: Past Surgical History  Procedure Laterality Date  . Heart surgery at 54yr  1955    "blood was going thru my lungs but not thru my heart like it should" (05/09/2013)  . Appendectomy  1970's  . Colonoscopy    . Left oophorectomy Left ~ 1985  . Colonoscopy  12/22/2011    Procedure: COLONOSCOPY;  Surgeon: Shirley Friar, MD;  Location: [facility];  Service: Endoscopy;  Laterality: N/A;  . Tumor excision Left ~ 1984    "under breast" (05/09/2013)  . Knee arthroscopy Left ~ 2004; ~ 2006  . Lipoma excision  02/15/2012    Procedure: EXCISION LIPOMA;  Surgeon: Clovis Pu. Cornett, MD;  Location: [facility];  Service: General;  Laterality: Left;  excision lipoma Left Lower abdomen  . 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 Tubal ligation Left ~ 1985  . Total knee arthroplasty Left 05/08/2013    Procedure: TOTAL KNEE ARTHROPLASTY;  Surgeon: Loreta Ave, MD;  Location: [facility] OR;  Service: Orthopedics;  Laterality: Left;    SOCIAL HISTORY:  reports that she  quit smoking about 21 months ago. Her smoking use included Cigarettes. She has a 70 pack-year smoking history. She has never used smokeless tobacco. She reports that she does not drink alcohol or use illicit drugs.  FAMILY HISTORY:  Family History  Problem Relation Age of Onset  . Colon cancer Father   . Cancer Father     colon  . Heart disease Father   . Malignant hyperthermia Neg Hx   . Heart disease Paternal Uncle   . Diabetes Sister     CURRENT MEDICATIONS: Reviewed per [facility]  REVIEW OF SYSTEMS:  See HPI otherwise 14 point ROS is negative.  PHYSICAL EXAMINATION  VS:  T  97.6       P 93       RR 20      BP 124/69      POX 96%        WT (Lb)  GENERAL: no acute distress, moderately obese body habitus SKIN: warm & dry, no suspicious lesions or rashes, no excessive dryness, left knee incision clean and dry, staples are in place  EYES: conjunctivae normal, sclerae normal, normal eye lids MOUTH/THROAT: lips without lesions,no lesions in the mouth,tongue is without lesions,uvula elevates in midline NECK: supple, trachea midline, no neck masses, no thyroid tenderness, no thyromegaly LYMPHATICS: no LAN in the neck, no supraclavicular LAN RESPIRATORY: breathing is even & unlabored, BS CTAB CARDIAC: RRR, no murmur,no extra heart sounds EDEMA/VARICOSITIES:  +2 bilateral lower extremity edema  ARTERIAL:  pedal pulses nonpalpable   GI:  ABDOMEN: abdomen soft, normal BS, no masses, no tenderness  LIVER/SPLEEN: no hepatomegaly, no splenomegaly MUSCULOSKELETAL: HEAD: normal to inspection & palpation BACK: no kyphosis, scoliosis or spinal processes tenderness EXTREMITIES: LEFT UPPER EXTREMITY: full range of motion, normal strength & tone RIGHT UPPER EXTREMITY:  full range of motion, normal strength & tone LEFT LOWER EXTREMITY: strength intact, range of motion not tested due to surgery  RIGHT LOWER EXTREMITY:  full range of motion, normal strength & tone PSYCHIATRIC: the patient is alert & oriented to person, affect & behavior appropriate  LABS/RADIOLOGY: Hemoglobin 10.9, WBC 10.8, platelets 207.    Glucose 133, otherwise BMP normal.    INR 2.08.    Left lower extremity venous doppler negative for DVT.    ASSESSMENT/PLAN:  Left knee osteoarthritis.  Status post left total knee arthroplasty.   Continue rehabilitation.  Hypertension.  Well controlled.   COPD.  Well compensated.    Left knee pain.  Uncontrolled problem.  OxyIR was increased.    Allergic rhinitis.  Currently on Zyrtec.    Check CBC and BMP.    I have reviewed patient's medical records  received at admission/from hospitalization.  CPT CODE: 16109

## 2013-06-16 ENCOUNTER — Emergency Department (HOSPITAL_COMMUNITY): Payer: PRIVATE HEALTH INSURANCE

## 2013-06-16 ENCOUNTER — Emergency Department (HOSPITAL_COMMUNITY)
Admission: EM | Admit: 2013-06-16 | Discharge: 2013-06-17 | Disposition: A | Discharge status: Home / Self Care | Payer: PRIVATE HEALTH INSURANCE | Attending: Emergency Medicine | Admitting: Emergency Medicine

## 2013-06-16 DIAGNOSIS — R52 Pain, unspecified: Secondary | ICD-10-CM | POA: Insufficient documentation

## 2013-06-16 DIAGNOSIS — M25562 Pain in left knee: Secondary | ICD-10-CM

## 2013-06-16 DIAGNOSIS — J449 Chronic obstructive pulmonary disease, unspecified: Secondary | ICD-10-CM | POA: Insufficient documentation

## 2013-06-16 DIAGNOSIS — I10 Essential (primary) hypertension: Secondary | ICD-10-CM | POA: Insufficient documentation

## 2013-06-16 DIAGNOSIS — Z8679 Personal history of other diseases of the circulatory system: Secondary | ICD-10-CM | POA: Insufficient documentation

## 2013-06-16 DIAGNOSIS — J4489 Other specified chronic obstructive pulmonary disease: Secondary | ICD-10-CM | POA: Insufficient documentation

## 2013-06-16 DIAGNOSIS — Z880 Allergy status to penicillin: Secondary | ICD-10-CM | POA: Insufficient documentation

## 2013-06-16 DIAGNOSIS — Z87891 Personal history of nicotine dependence: Secondary | ICD-10-CM | POA: Insufficient documentation

## 2013-06-16 DIAGNOSIS — Z96659 Presence of unspecified artificial knee joint: Secondary | ICD-10-CM | POA: Insufficient documentation

## 2013-06-16 DIAGNOSIS — E785 Hyperlipidemia, unspecified: Secondary | ICD-10-CM | POA: Insufficient documentation

## 2013-06-16 DIAGNOSIS — M25569 Pain in unspecified knee: Secondary | ICD-10-CM | POA: Insufficient documentation

## 2013-06-16 DIAGNOSIS — Z8719 Personal history of other diseases of the digestive system: Secondary | ICD-10-CM | POA: Insufficient documentation

## 2013-06-16 DIAGNOSIS — M1710 Unilateral primary osteoarthritis, unspecified knee: Secondary | ICD-10-CM | POA: Insufficient documentation

## 2013-06-16 DIAGNOSIS — Z9889 Other specified postprocedural states: Secondary | ICD-10-CM | POA: Insufficient documentation

## 2013-06-16 DIAGNOSIS — Z79899 Other long term (current) drug therapy: Secondary | ICD-10-CM | POA: Insufficient documentation

## 2013-06-16 DIAGNOSIS — K219 Gastro-esophageal reflux disease without esophagitis: Secondary | ICD-10-CM | POA: Insufficient documentation

## 2013-06-16 LAB — CBC WITH DIFFERENTIAL/PLATELET
Basophils Absolute: 0.1 10*3/uL (ref 0.0–0.1)
Basophils Relative: 1 % (ref 0–1)
Eosinophils Absolute: 0.3 10*3/uL (ref 0.0–0.7)
Eosinophils Relative: 3 % (ref 0–5)
HCT: 36 % (ref 36.0–46.0)
Hemoglobin: 11.3 g/dL — ABNORMAL LOW (ref 12.0–15.0)
Lymphocytes Relative: 31 % (ref 12–46)
Lymphs Abs: 2.6 10*3/uL (ref 0.7–4.0)
MCH: 26.2 pg (ref 26.0–34.0)
MCHC: 31.4 g/dL (ref 30.0–36.0)
MCV: 83.5 fL (ref 78.0–100.0)
Monocytes Absolute: 0.5 10*3/uL (ref 0.1–1.0)
Monocytes Relative: 6 % (ref 3–12)
Neutro Abs: 5.1 10*3/uL (ref 1.7–7.7)
Neutrophils Relative %: 60 % (ref 43–77)
Platelets: 264 10*3/uL (ref 150–400)
RBC: 4.31 MIL/uL (ref 3.87–5.11)
RDW: 14.3 % (ref 11.5–15.5)
WBC: 8.5 10*3/uL (ref 4.0–10.5)

## 2013-06-16 LAB — BASIC METABOLIC PANEL
BUN: 13 mg/dL (ref 6–23)
CO2: 27 mEq/L (ref 19–32)
Calcium: 9.6 mg/dL (ref 8.4–10.5)
Chloride: 102 mEq/L (ref 96–112)
Creatinine, Ser: 0.59 mg/dL (ref 0.50–1.10)
GFR calc Af Amer: >90 mL/min (ref >90)
GFR calc non Af Amer: >90 mL/min (ref >90)
Glucose, Bld: 101 mg/dL — ABNORMAL HIGH (ref 70–99)
Potassium: 4 mEq/L (ref 3.5–5.1)
Sodium: 137 mEq/L (ref 135–145)

## 2013-06-16 MED ORDER — HYDROCODONE-ACETAMINOPHEN 5-325 MG PO TABS
1.0000 | ORAL_TABLET | Freq: Once | ORAL | Status: AC
  Administered 2013-06-16: 1 via ORAL
  Filled 2013-06-16: qty 1

## 2013-06-16 MED ORDER — MORPHINE SULFATE 4 MG/ML IJ SOLN
4.0000 mg | Freq: Once | INTRAMUSCULAR | Status: AC
Start: 2013-06-16 — End: 2013-06-16
  Administered 2013-06-16: 4 mg via INTRAMUSCULAR
  Filled 2013-06-16: qty 1

## 2013-06-16 MED ORDER — ONDANSETRON 8 MG PO TBDP
8.0000 mg | ORAL_TABLET | Freq: Once | ORAL | Status: AC
  Administered 2013-06-16: 8 mg via ORAL
  Filled 2013-06-16: qty 1

## 2013-06-16 NOTE — Consult Note (Signed)
Reason for Consult:Left knee pain and swelling 5 weeks out from a total knee Referring Physician: ER  Summer Jackson is an 67 y.o. female.  HPI: 5 weeks out from L TKA by Dr. Richardson Landry.  She has had consistent pain and swelling since discharge.  She had a slight increase in swelling and came to the ER for evaluation.  Past Medical History  Diagnosis Date  . Hypertension   . Hyperlipemia   . GERD (gastroesophageal reflux disease)   . Rectal bleeding   . Blood transfusion ~ 1955    "before heart OR" (05/09/2013)  . COPD (chronic obstructive pulmonary disease)   . Weight loss   . Nasal congestion   . Wheezing   . Asthma   . Hemorrhoids   . Urinary frequency   . Hypertension   . Exertional shortness of breath   . History of stomach ulcers 1990's  . Migraines   . Arthritis     "knees" (05/09/2013)    Past Surgical History  Procedure Laterality Date  . Heart surgery at 67yr  1955    "blood was going thru my lungs but not thru my heart like it should" (05/09/2013)  . Appendectomy  1970's  . Colonoscopy    . Left oophorectomy Left ~ 1985  . Colonoscopy  12/22/2011    Procedure: COLONOSCOPY;  Surgeon: Shirley Friar, MD;  Location: [facility];  Service: Endoscopy;  Laterality: N/A;  . Tumor excision Left ~ 1984    "under breast" (05/09/2013)  . Knee arthroscopy Left ~ 2004; ~ 2006  . Lipoma excision  02/15/2012    Procedure: EXCISION LIPOMA;  Surgeon: Clovis Pu. Cornett, MD;  Location: [facility];  Service: General;  Laterality: Left;  excision lipoma Left Lower abdomen  . 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 Tubal ligation Left ~ 1985  . Total knee arthroplasty Left 05/08/2013    Procedure: TOTAL KNEE ARTHROPLASTY;  Surgeon: Loreta Ave, MD;  Location: [facility] OR;  Service: Orthopedics;  Laterality: Left;    Family History  Problem Relation Age of Onset  . Colon cancer Father   . Cancer Father     colon  . Heart disease Father   . Malignant hyperthermia  Neg Hx   . Heart disease Paternal Uncle   . Diabetes Sister     Social History:  reports that she quit smoking about 21 months ago. Her smoking use included Cigarettes. She has a 70 pack-year smoking history. She has never used smokeless tobacco. She reports that she does not drink alcohol or use illicit drugs.  Allergies:  Allergies  Allergen Reactions  . Sulfa Antibiotics Swelling and Rash  . Amoxicillin Other (See Comments)    REACTION: headache  . Azithromycin Other (See Comments)    REACTION: reddness around mouth  . Penicillins Other (See Comments)    REACTION: headache  . Propoxyphene-Acetaminophen Other (See Comments)    REACTION: mouth sores  . Clarithromycin Rash    REACTION: skin rash with minimal vesicular formation on arms and hands    Medications: I have reviewed the patient's current medications.  Results for orders placed during the hospital encounter of 06/16/13 (from the past 48 hour(s))  CBC WITH DIFFERENTIAL     Status: Abnormal   Collection Time    06/16/13  8:44 PM      Result Value Range   WBC 8.5  4.0 - 10.5 K/uL   RBC 4.31  3.87 - 5.11 MIL/uL  Hemoglobin 11.3 (*) 12.0 - 15.0 g/dL   HCT 14.7  82.9 - 56.2 %   MCV 83.5  78.0 - 100.0 fL   MCH 26.2  26.0 - 34.0 pg   MCHC 31.4  30.0 - 36.0 g/dL   RDW 13.0  86.5 - 78.4 %   Platelets 264  150 - 400 K/uL   Neutrophils Relative % 60  43 - 77 %   Neutro Abs 5.1  1.7 - 7.7 K/uL   Lymphocytes Relative 31  12 - 46 %   Lymphs Abs 2.6  0.7 - 4.0 K/uL   Monocytes Relative 6  3 - 12 %   Monocytes Absolute 0.5  0.1 - 1.0 K/uL   Eosinophils Relative 3  0 - 5 %   Eosinophils Absolute 0.3  0.0 - 0.7 K/uL   Basophils Relative 1  0 - 1 %   Basophils Absolute 0.1  0.0 - 0.1 K/uL  BASIC METABOLIC PANEL     Status: Abnormal   Collection Time    06/16/13  8:44 PM      Result Value Range   Sodium 137  135 - 145 mEq/L   Potassium 4.0  3.5 - 5.1 mEq/L   Chloride 102  96 - 112 mEq/L   CO2 27  19 - 32 mEq/L    Glucose, Bld 101 (*) 70 - 99 mg/dL   BUN 13  6 - 23 mg/dL   Creatinine, Ser 6.96  0.50 - 1.10 mg/dL   Calcium 9.6  8.4 - 29.5 mg/dL   GFR calc non Af Amer >90  >90 mL/min   GFR calc Af Amer >90  >90 mL/min   Comment:            The eGFR has been calculated     using the CKD EPI equation.     This calculation has not been     validated in all clinical     situations.     eGFR's persistently     <90 mL/min signify     possible Chronic Kidney Disease.    Dg Knee Complete 4 Views Left  06/16/2013   *RADIOLOGY REPORT*  Clinical Data: Left knee pain for 4 days, worse today.  Left knee arthroplasty approximately 6 weeks ago.  LEFT KNEE - COMPLETE 4+ VIEW  Comparison: Postoperative radiographs 05/08/2013.  Findings: The left total knee arthroplasty has a stable appearance without evidence of loosening.  There is no evidence of acute fracture or dislocation.  The surgical drain and skin staples have been removed.  There is no longer any soft tissue emphysema, although there may be residual soft tissue swelling anteriorly and a knee joint effusion.  A soft tissue calcification in the distal lateral thigh is unchanged. Chronic deformity of the proximal fibula is unchanged.  IMPRESSION:  1.  No acute osseous findings or prosthetic loosening status post total knee arthroplasty. 2.  Anterior soft tissue swelling and knee joint effusion.   Original Report Authenticated By: Carey Bullocks, M.D.    Review of Systems  All other systems reviewed and are negative.   Blood pressure 124/48, pulse 88, temperature 98.7 F (37.1 C), temperature source Oral, resp. rate 20, SpO2 100.00%. Physical Exam  Assessment/Plan: I aspirated the knee and got about 4 cc of clotted blood.  I think she has a intra-articular hematoma.  We sent the fluid for cell count with differential and gram stain.  She will call Dr. Greig Right office tomorrow for follow up.  SDL  Erven Ramson,STEPHEN D 06/16/2013, 11:19 PM

## 2013-06-16 NOTE — ED Provider Notes (Signed)
History    This chart was scribed for non-physician practitioner, Mora Bellman, PA-C, working with [name], MD by Melba Coon, ED Scribe. This patient was seen in room WTR7/WTR7 and the patient's care was started at 7:03PM.  CSN: [id number] Arrival date & time 06/16/13  1851  First MD Initiated Contact with Patient 06/16/13 1905     Chief Complaint  Patient presents with  . Knee Pain   (Consider location/radiation/quality/duration/timing/severity/associated sxs/prior Treatment) The history is provided by the patient. No language interpreter was used.   HPI Comments: Summer Jackson is a 67 y.o. female who presents to the Emergency Department complaining of constant, moderate to severe, throbbing, left knee pain with an onset 5 weeks ago that has gotten progressively worse. She has a history of left knee replacement about 5 weeks ago as well as a history of arthritis in her knees. Since her surgery, she has had increased pain with radiation from her left knee to her left hip. She was able to bend her knee when she left rehab, but it has been getting increasingly more stiff over the past 3 weeks. She has a history of a previous surigcal site infection and is concerned today because of increased redness and warmth to the knee. Denies fever. No other pertinent medical symptoms.  Past Medical History  Diagnosis Date  . Hypertension   . Hyperlipemia   . GERD (gastroesophageal reflux disease)   . Rectal bleeding   . Blood transfusion ~ 1955    "before heart OR" (05/09/2013)  . COPD (chronic obstructive pulmonary disease)   . Weight loss   . Nasal congestion   . Wheezing   . Asthma   . Hemorrhoids   . Urinary frequency   . Hypertension   . Exertional shortness of breath   . History of stomach ulcers 1990's  . Migraines   . Arthritis     "knees" (05/09/2013)   Past Surgical History  Procedure Laterality Date  . Heart surgery at 75yr  1955    "blood was going thru my  lungs but not thru my heart like it should" (05/09/2013)  . Appendectomy  1970's  . Colonoscopy    . Left oophorectomy Left ~ 1985  . Colonoscopy  12/22/2011    Procedure: COLONOSCOPY;  Surgeon: Shirley Friar, MD;  Location: [facility];  Service: Endoscopy;  Laterality: N/A;  . Tumor excision Left ~ 1984    "under breast" (05/09/2013)  . Knee arthroscopy Left ~ 2004; ~ 2006  . Lipoma excision  02/15/2012    Procedure: EXCISION LIPOMA;  Surgeon: Clovis Pu. Cornett, MD;  Location: [facility];  Service: General;  Laterality: Left;  excision lipoma Left Lower abdomen  . 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 Tubal ligation Left ~ 1985  . Total knee arthroplasty Left 05/08/2013    Procedure: TOTAL KNEE ARTHROPLASTY;  Surgeon: Loreta Ave, MD;  Location: [facility] OR;  Service: Orthopedics;  Laterality: Left;   Family History  Problem Relation Age of Onset  . Colon cancer Father   . Cancer Father     colon  . Heart disease Father   . Malignant hyperthermia Neg Hx   . Heart disease Paternal Uncle   . Diabetes Sister    History  Substance Use Topics  . Smoking status: Former Smoker -- 2.00 packs/day for 35 years    Types: Cigarettes    Quit date: 08/23/2011  . Smokeless tobacco: Never Used  .  Alcohol Use: No   OB History   Grav Para Term Preterm Abortions TAB SAB Ect Mult Living                 Review of Systems  Constitutional: Negative for fever, appetite change and fatigue.  HENT: Negative for congestion, sinus pressure and ear discharge.   Eyes: Negative for discharge.  Respiratory: Negative for cough.   Cardiovascular: Negative for chest pain.  Gastrointestinal: Negative for abdominal pain and diarrhea.  Genitourinary: Negative for frequency and hematuria.  Musculoskeletal: Positive for joint swelling and arthralgias. Negative for back pain.  Skin: Negative for rash.  Neurological: Negative for seizures and headaches.  Psychiatric/Behavioral: Negative  for hallucinations.  All other systems reviewed and are negative.    Allergies  Sulfa antibiotics; Amoxicillin; Azithromycin; Penicillins; Propoxyphene-acetaminophen; and Clarithromycin  Home Medications   Current Outpatient Rx  Name  Route  Sig  Dispense  Refill  . albuterol (PROVENTIL HFA;VENTOLIN HFA) 108 (90 BASE) MCG/ACT inhaler   Inhalation   Inhale 2 puffs into the lungs every 6 (six) hours as needed. Shortness of breath         . cetirizine (ZYRTEC) 10 MG tablet   Oral   Take 10 mg by mouth every evening.          Marland Kitchen esomeprazole (NEXIUM) 40 MG capsule   Oral   Take 40 mg by mouth daily before breakfast.         . HYDROmorphone (DILAUDID) 2 MG tablet   Oral   Take 2 mg by mouth every 4 (four) hours as needed for pain.         Marland Kitchen losartan (COZAAR) 50 MG tablet   Oral   Take 1 tablet (50 mg total) by mouth daily.   30 tablet   11   . simvastatin (ZOCOR) 10 MG tablet   Oral   Take 10 mg by mouth at bedtime.         . topiramate (TOPAMAX) 50 MG tablet   Oral   Take 50 mg by mouth 2 (two) times daily as needed. For headache pain          BP 124/48  Pulse 88  Temp(Src) 98.7 F (37.1 C) (Oral)  Resp 20  SpO2 100% Physical Exam  Nursing note and vitals reviewed. Constitutional: She is oriented to person, place, and time. She appears well-developed and well-nourished. No distress.  HENT:  Head: Normocephalic and atraumatic.  Right Ear: External ear normal.  Left Ear: External ear normal.  Nose: Nose normal.  Mouth/Throat: Oropharynx is clear and moist.  Eyes: Conjunctivae are normal.  Neck: Normal range of motion.  Cardiovascular: Normal rate, regular rhythm and normal heart sounds.   Pulmonary/Chest: Effort normal and breath sounds normal. No stridor. No respiratory distress. She has no wheezes. She has no rales.  Abdominal: Soft. She exhibits no distension.  Musculoskeletal:       Left knee: She exhibits decreased range of motion, swelling,  effusion and erythema. Tenderness found.  Left knee is warm, swollen, and erythematous  Neurological: She is alert and oriented to person, place, and time. She has normal strength.  Skin: Skin is warm and dry. She is not diaphoretic. No erythema.  Psychiatric: She has a normal mood and affect. Her behavior is normal.    ED Course  Procedures (including critical care time)  COORDINATION OF CARE:  7:08PM - since she is age 67 and is on Medicare, will consult with attending physician,  Dr. Rosalia Hammers.  8:30PM - morphine, Zofran, CBC with differential, BMP, and left knee XR will be ordered for [employer] [facility].   10:00PM - imaging and lab results reviewed and are overall negative for fracture. Her condition has improved with treatment here at the ED.  10:08PM - Dr Sherlean Foot, on call orthopedic specialist, is consulted. He will come by to check for infection aspirate joint.  Labs Reviewed  CBC WITH DIFFERENTIAL - Abnormal; Notable for the following:    Hemoglobin 11.3 (*)    All other components within normal limits  BASIC METABOLIC PANEL - Abnormal; Notable for the following:    Glucose, Bld 101 (*)    All other components within normal limits   Dg Knee Complete 4 Views Left  06/16/2013   *RADIOLOGY REPORT*  Clinical Data: Left knee pain for 4 days, worse today.  Left knee arthroplasty approximately 6 weeks ago.  LEFT KNEE - COMPLETE 4+ VIEW  Comparison: Postoperative radiographs 05/08/2013.  Findings: The left total knee arthroplasty has a stable appearance without evidence of loosening.  There is no evidence of acute fracture or dislocation.  The surgical drain and skin staples have been removed.  There is no longer any soft tissue emphysema, although there may be residual soft tissue swelling anteriorly and a knee joint effusion.  A soft tissue calcification in the distal lateral thigh is unchanged. Chronic deformity of the proximal fibula is unchanged.  IMPRESSION:  1.  No acute osseous findings or  prosthetic loosening status post total knee arthroplasty. 2.  Anterior soft tissue swelling and knee joint effusion.   Original Report Authenticated By: Carey Bullocks, M.D.   1. Knee pain, acute, left     MDM  Patient presents with hot, swollen knee 5 weeks after a TKA. Surgery done by Dr. Eulah Pont. Dr. Sherlean Foot on call for group. He evaluated patient in ED and drained knee. He believes she has an intra-articular hematoma. Gram stain and cell count will be sent to lab. Patient will follow up as an outpatient. Return instructions given. Vital signs stable for discharge. Patient / Family / Caregiver informed of clinical course, understand medical decision-making process, and agree with plan.   I personally performed the services described in this documentation, which was scribed in my presence. The recorded information has been reviewed and is accurate.      Mora Bellman, PA-C 06/17/13 [phone]

## 2013-06-16 NOTE — ED Notes (Signed)
Pt has edema  and erythema to the left knee.  Pt had knee replacement surgery may 21, 14 . One Steri strip still attached to incision on left knee. Incision well approximated. Pt unable to bend left knee.

## 2013-06-17 MED ORDER — OXYCODONE-ACETAMINOPHEN 5-325 MG PO TABS: 2.0000 | ORAL_TABLET | Freq: Four times a day (QID) | ORAL | Status: DC | PRN

## 2013-06-17 MED ORDER — HYDROCODONE-ACETAMINOPHEN 5-325 MG PO TABS
1.0000 | ORAL_TABLET | Freq: Once | ORAL | Status: AC
  Administered 2013-06-17: 1 via ORAL
  Filled 2013-06-17: qty 1

## 2013-06-17 MED ORDER — PROMETHAZINE HCL 25 MG PO TABS: 25.0000 mg | ORAL_TABLET | Freq: Four times a day (QID) | ORAL | Status: DC | PRN

## 2013-06-17 NOTE — ED Notes (Signed)
Summer Jackson from lab called and stated there was not enough knee fluid to run a cell and diff count.

## 2013-06-18 NOTE — ED Provider Notes (Signed)
67 y.o. Female s/p knee replacement four weeks ago of left knee.  Now with increasing pain, redness, and warmth.   PE Left knee is warm with some swelling and decreasd rom.    [name], MD 06/18/13 [phone]

## 2013-06-20 LAB — BODY FLUID CULTURE
Culture: NO GROWTH
Gram Stain: NONE SEEN
Special Requests: NORMAL

## 2013-07-02 ENCOUNTER — Other Ambulatory Visit: Payer: Self-pay | Admitting: Geriatric Medicine

## 2013-10-15 ENCOUNTER — Other Ambulatory Visit: Payer: Self-pay | Admitting: Orthopedic Surgery

## 2013-10-15 DIAGNOSIS — R52 Pain, unspecified: Secondary | ICD-10-CM

## 2013-10-16 ENCOUNTER — Ambulatory Visit
Admission: RE | Admit: 2013-10-16 | Discharge: 2013-10-16 | Disposition: A | Discharge status: Home / Self Care | Payer: PRIVATE HEALTH INSURANCE | Source: Ambulatory Visit | Attending: Orthopedic Surgery | Admitting: Orthopedic Surgery

## 2013-10-16 DIAGNOSIS — R52 Pain, unspecified: Secondary | ICD-10-CM

## 2014-01-31 ENCOUNTER — Ambulatory Visit (INDEPENDENT_AMBULATORY_CARE_PROVIDER_SITE_OTHER): Payer: 59 | Admitting: Emergency Medicine

## 2014-01-31 VITALS — BP 124/80 | HR 73 | Temp 98.0°F | Resp 16 | Ht 61.5 in | Wt 171.0 lb

## 2014-01-31 DIAGNOSIS — L988 Other specified disorders of the skin and subcutaneous tissue: Secondary | ICD-10-CM

## 2014-01-31 DIAGNOSIS — J111 Influenza due to unidentified influenza virus with other respiratory manifestations: Secondary | ICD-10-CM

## 2014-01-31 DIAGNOSIS — R238 Other skin changes: Secondary | ICD-10-CM

## 2014-01-31 MED ORDER — DEXTROMETHORPHAN-GUAIFENESIN 10-200 MG PO CAPS: 1.0000 | ORAL_CAPSULE | Freq: Four times a day (QID) | ORAL | Status: DC | PRN

## 2014-01-31 MED ORDER — OSELTAMIVIR PHOSPHATE 75 MG PO CAPS: 75.0000 mg | ORAL_CAPSULE | Freq: Two times a day (BID) | ORAL | Status: DC

## 2014-01-31 NOTE — Progress Notes (Signed)
[facility] [address] [place] [address] [phone]  Date:  01/31/2014   Name:  Summer Jackson   DOB:  02-10-1946   MRN:  [id number]  PCP:  Elyn Peers, MD    Chief Complaint: Headache and Cough   History of Present Illness:  Summer Jackson is a 68 y.o. very pleasant female patient who presents with the following:  Ill since yesterday with sudden nasal congestion, drainage and a headache in frontal area.  No fever or chills.  Has a cough productive of scant mucoid sputum.  No shortness of breath but does wheeze at times.  No improvement with over the counter medications or other home remedies. Has a tender painful eruption in intergluteal cleft.  Denies history of STD.  Denies other complaint or health concern today.   Patient Active Problem List   Diagnosis Date Noted  . Constipation 05/29/2013  . Allergy to pollen 05/29/2013  . Hypertension   . Hyperlipemia   . Headache(784.0)   . GERD (gastroesophageal reflux disease)   . COPD (chronic obstructive pulmonary disease)   . Weight loss   . Nasal congestion   . Left shoulder pain 02/05/2013  . Chronic cough 01/24/2013  . Prediabetes 01/10/2013  . Lipoma of abdominal wall 01/23/2012  . Hemorrhoids 01/23/2012  . Rectal bleeding 12/22/2011  . Family history of malignant neoplasm of gastrointestinal tract 12/22/2011  . OSTEOARTHRITIS, KNEE, LEFT 09/10/2009  . COPD 08/25/2009  . GERD 08/25/2009  . SHINGLES, RECURRENT 09/30/2008  . HYPERLIPIDEMIA 08/25/2007  . COMMON MIGRAINE 08/09/2007  . HYPERTENSION 08/09/2007  . CONGENITAL HEART DISEASE, 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    Past Medical History  Diagnosis Date  . Hypertension   . Hyperlipemia   . GERD (gastroesophageal reflux disease)   . Rectal bleeding   . Blood transfusion ~ 1955    "before heart OR" (05/09/2013)  . COPD (chronic obstructive pulmonary disease)   . Weight loss   . Nasal congestion   . Wheezing   . Asthma   . Hemorrhoids   .  Urinary frequency   . Hypertension   . Exertional shortness of breath   . History of stomach ulcers 1990's  . Migraines   . Arthritis     "knees" (05/09/2013)    Past Surgical History  Procedure Laterality Date  . Heart surgery at 49yr  1955    "blood was going thru my lungs but not thru my heart like it should" (05/09/2013)  . Appendectomy  1970's  . Colonoscopy    . Left oophorectomy Left ~ 1985  . Colonoscopy  12/22/2011    Procedure: COLONOSCOPY;  Surgeon: Lear Ng, MD;  Location: [facility];  Service: Endoscopy;  Laterality: N/A;  . Tumor excision Left ~ 1984    "under breast" (05/09/2013)  . Knee arthroscopy Left ~ 2004; ~ 2006  . Lipoma excision  02/15/2012    Procedure: EXCISION LIPOMA;  Surgeon: Joyice Faster. Cornett, MD;  Location: [place];  Service: General;  Laterality: Left;  excision lipoma Left Lower abdomen  . 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 Tubal ligation Left ~ 1985  . Total knee arthroplasty Left 05/08/2013    Procedure: TOTAL KNEE ARTHROPLASTY;  Surgeon: Ninetta Lights, MD;  Location: [place];  Service: Orthopedics;  Laterality: Left;    History  Substance Use Topics  . Smoking status: Current Every Day Smoker -- 2.00 packs/day for 35 years    Types: Cigarettes  Last Attempt to Quit: 08/23/2011  . Smokeless tobacco: Never Used  . Alcohol Use: No    Family History  Problem Relation Age of Onset  . Colon cancer Father   . Cancer Father     colon  . Heart disease Father   . Malignant hyperthermia Neg Hx   . Heart disease Paternal Uncle   . Diabetes Sister     Allergies  Allergen Reactions  . Sulfa Antibiotics Swelling and Rash  . Amoxicillin Other (See Comments)    REACTION: headache  . Azithromycin Other (See Comments)    REACTION: reddness around mouth  . Penicillins Other (See Comments)    REACTION: headache  . Propoxyphene N-Acetaminophen Other (See Comments)    REACTION: mouth sores  . Clarithromycin Rash     REACTION: skin rash with minimal vesicular formation on arms and hands    Medication list has been reviewed and updated.  Current Outpatient Prescriptions on File Prior to Visit  Medication Sig Dispense Refill  . albuterol (PROVENTIL HFA;VENTOLIN HFA) 108 (90 BASE) MCG/ACT inhaler Inhale 2 puffs into the lungs every 6 (six) hours as needed. Shortness of breath      . cetirizine (ZYRTEC) 10 MG tablet Take 10 mg by mouth every evening.       Marland Kitchen esomeprazole (NEXIUM) 40 MG capsule Take 40 mg by mouth daily before breakfast.      . losartan (COZAAR) 50 MG tablet Take 1 tablet (50 mg total) by mouth daily.  30 tablet  11  . promethazine (PHENERGAN) 25 MG tablet Take 1 tablet (25 mg total) by mouth every 6 (six) hours as needed for nausea.  12 tablet  0  . simvastatin (ZOCOR) 10 MG tablet Take 10 mg by mouth at bedtime.      . topiramate (TOPAMAX) 50 MG tablet Take 50 mg by mouth 2 (two) times daily as needed. For headache pain      . HYDROmorphone (DILAUDID) 2 MG tablet Take 2 mg by mouth every 4 (four) hours as needed for pain.      Marland Kitchen oxyCODONE-acetaminophen (PERCOCET/ROXICET) 5-325 MG per tablet Take 2 tablets by mouth every 6 (six) hours as needed for pain.  12 tablet  0   No current facility-administered medications on file prior to visit.    Review of Systems:  As per HPI, otherwise negative.    Physical Examination: Filed Vitals:   01/31/14 1715  BP: 124/80  Pulse: 73  Temp: 98 F (36.7 C)  Resp: 16   Filed Vitals:   01/31/14 1715  Height: 5' 1.5" (1.562 m)  Weight: 171 lb (77.565 kg)   Body mass index is 31.79 kg/(m^2). Ideal Body Weight: Weight in (lb) to have BMI = 25: 134.2  GEN: WDWN, NAD, Non-toxic, A & O x 3 HEENT: Atraumatic, Normocephalic. Neck supple. No masses, No LAD. Ears and Nose: No external deformity. CV: RRR, No M/G/R. No JVD. No thrill. No extra heart sounds. PULM: CTA B, no wheezes, crackles, rhonchi. No retractions. No resp. distress. No accessory  muscle use. ABD: S, NT, ND, +BS. No rebound. No HSM. EXTR: No c/c/e NEURO Normal gait.  PSYCH: Normally interactive. Conversant. Not depressed or anxious appearing.  Calm demeanor.  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  Assessment and Plan: ?herpes Influenza tamiflu Coricidin HBP  Signed,  Ellison Carwin, MD

## 2014-01-31 NOTE — Patient Instructions (Signed)

## 2014-02-03 LAB — HERPES SIMPLEX VIRUS CULTURE: Organism ID, Bacteria: NOT DETECTED

## 2014-02-09 ENCOUNTER — Encounter (HOSPITAL_COMMUNITY): Payer: Self-pay | Admitting: Emergency Medicine

## 2014-02-09 ENCOUNTER — Emergency Department (HOSPITAL_COMMUNITY): Payer: PRIVATE HEALTH INSURANCE

## 2014-02-09 ENCOUNTER — Emergency Department (HOSPITAL_COMMUNITY)
Admission: EM | Admit: 2014-02-09 | Discharge: 2014-02-09 | Disposition: A | Discharge status: Home / Self Care | Payer: PRIVATE HEALTH INSURANCE | Attending: Emergency Medicine | Admitting: Emergency Medicine

## 2014-02-09 DIAGNOSIS — J449 Chronic obstructive pulmonary disease, unspecified: Secondary | ICD-10-CM | POA: Insufficient documentation

## 2014-02-09 DIAGNOSIS — Z880 Allergy status to penicillin: Secondary | ICD-10-CM | POA: Insufficient documentation

## 2014-02-09 DIAGNOSIS — Z8711 Personal history of peptic ulcer disease: Secondary | ICD-10-CM | POA: Insufficient documentation

## 2014-02-09 DIAGNOSIS — B9789 Other viral agents as the cause of diseases classified elsewhere: Secondary | ICD-10-CM

## 2014-02-09 DIAGNOSIS — Z9889 Other specified postprocedural states: Secondary | ICD-10-CM | POA: Insufficient documentation

## 2014-02-09 DIAGNOSIS — J988 Other specified respiratory disorders: Secondary | ICD-10-CM

## 2014-02-09 DIAGNOSIS — Z79899 Other long term (current) drug therapy: Secondary | ICD-10-CM | POA: Insufficient documentation

## 2014-02-09 DIAGNOSIS — E785 Hyperlipidemia, unspecified: Secondary | ICD-10-CM | POA: Insufficient documentation

## 2014-02-09 DIAGNOSIS — M1710 Unilateral primary osteoarthritis, unspecified knee: Secondary | ICD-10-CM | POA: Insufficient documentation

## 2014-02-09 DIAGNOSIS — I10 Essential (primary) hypertension: Secondary | ICD-10-CM | POA: Insufficient documentation

## 2014-02-09 DIAGNOSIS — J4489 Other specified chronic obstructive pulmonary disease: Secondary | ICD-10-CM | POA: Insufficient documentation

## 2014-02-09 DIAGNOSIS — J069 Acute upper respiratory infection, unspecified: Secondary | ICD-10-CM | POA: Insufficient documentation

## 2014-02-09 DIAGNOSIS — G43909 Migraine, unspecified, not intractable, without status migrainosus: Secondary | ICD-10-CM | POA: Insufficient documentation

## 2014-02-09 DIAGNOSIS — IMO0002 Reserved for concepts with insufficient information to code with codable children: Secondary | ICD-10-CM

## 2014-02-09 DIAGNOSIS — K219 Gastro-esophageal reflux disease without esophagitis: Secondary | ICD-10-CM | POA: Insufficient documentation

## 2014-02-09 DIAGNOSIS — Z87891 Personal history of nicotine dependence: Secondary | ICD-10-CM | POA: Insufficient documentation

## 2014-02-09 LAB — BASIC METABOLIC PANEL
BUN: 14 mg/dL (ref 6–23)
CO2: 25 mEq/L (ref 19–32)
Calcium: 9.3 mg/dL (ref 8.4–10.5)
Chloride: 103 mEq/L (ref 96–112)
Creatinine, Ser: 0.57 mg/dL (ref 0.50–1.10)
GFR calc Af Amer: >90 mL/min (ref >90)
GFR calc non Af Amer: >90 mL/min (ref >90)
Glucose, Bld: 101 mg/dL — ABNORMAL HIGH (ref 70–99)
Potassium: 4.1 mEq/L (ref 3.7–5.3)
Sodium: 142 mEq/L (ref 137–147)

## 2014-02-09 LAB — I-STAT TROPONIN, ED: Troponin i, poc: 0.01 ng/mL (ref 0.00–0.08)

## 2014-02-09 LAB — CBC WITH DIFFERENTIAL/PLATELET
Basophils Absolute: 0.1 10*3/uL (ref 0.0–0.1)
Basophils Relative: 1 % (ref 0–1)
Eosinophils Absolute: 0.3 10*3/uL (ref 0.0–0.7)
Eosinophils Relative: 4 % (ref 0–5)
HCT: 36.3 % (ref 36.0–46.0)
Hemoglobin: 11.9 g/dL — ABNORMAL LOW (ref 12.0–15.0)
Lymphocytes Relative: 36 % (ref 12–46)
Lymphs Abs: 3 10*3/uL (ref 0.7–4.0)
MCH: 27.9 pg (ref 26.0–34.0)
MCHC: 32.8 g/dL (ref 30.0–36.0)
MCV: 85.2 fL (ref 78.0–100.0)
Monocytes Absolute: 0.6 10*3/uL (ref 0.1–1.0)
Monocytes Relative: 7 % (ref 3–12)
Neutro Abs: 4.4 10*3/uL (ref 1.7–7.7)
Neutrophils Relative %: 53 % (ref 43–77)
Platelets: 218 10*3/uL (ref 150–400)
RBC: 4.26 MIL/uL (ref 3.87–5.11)
RDW: 13.7 % (ref 11.5–15.5)
WBC: 8.3 10*3/uL (ref 4.0–10.5)

## 2014-02-09 MED ORDER — DOXYCYCLINE HYCLATE 100 MG PO CAPS: 100.0000 mg | ORAL_CAPSULE | Freq: Two times a day (BID) | ORAL | Status: DC

## 2014-02-09 MED ORDER — HYDROCOD POLST-CHLORPHEN POLST 10-8 MG/5ML PO LQCR: 5.0000 mL | Freq: Two times a day (BID) | ORAL | Status: DC | PRN

## 2014-02-09 NOTE — ED Provider Notes (Signed)
CSN: [id number]     Arrival date & time 02/09/14  0145 History   First MD Initiated Contact with Patient 02/09/14 0255     Chief Complaint  Patient presents with  . Influenza     (Consider location/radiation/quality/duration/timing/severity/associated sxs/prior Treatment) HPI History provided by pt.   Pt was diagnosed w/ influenza at an urgent care 8 days ago and prescribed tamiflu.  Took last dose yesterday am, and has not had any improvement in symptoms.  Sx include chills, myalgias, frontal headache, nasal congestion, rhinorrhea, mild sore throat, cough productive of pink-tinged sputum.  Headache started 3 days ago. Has had pain L anterior chest as well.  Constant x 3 days.  No aggravating/alleviating factors.  No known fever and denies SOB, abd pain, vomiting and diarrhea.  PMH sig for tobacco abuse, HTN, hyperlipidemia and COPD. Past Medical History  Diagnosis Date  . Hypertension   . Hyperlipemia   . GERD (gastroesophageal reflux disease)   . Rectal bleeding   . Blood transfusion ~ 1955    "before heart OR" (05/09/2013)  . COPD (chronic obstructive pulmonary disease)   . Weight loss   . Nasal congestion   . Wheezing   . Asthma   . Hemorrhoids   . Urinary frequency   . Hypertension   . Exertional shortness of breath   . History of stomach ulcers 1990's  . Migraines   . Arthritis     "knees" (05/09/2013)   Past Surgical History  Procedure Laterality Date  . Heart surgery at 64yr  1955    "blood was going thru my lungs but not thru my heart like it should" (05/09/2013)  . Appendectomy  1970's  . Colonoscopy    . Left oophorectomy Left ~ 1985  . Colonoscopy  12/22/2011    Procedure: COLONOSCOPY;  Surgeon: Shirley Friar, MD;  Location: [facility];  Service: Endoscopy;  Laterality: N/A;  . Tumor excision Left ~ 1984    "under breast" (05/09/2013)  . Knee arthroscopy Left ~ 2004; ~ 2006  . Lipoma excision  02/15/2012    Procedure: EXCISION LIPOMA;  Surgeon: Clovis Pu.  Cornett, MD;  Location: [facility];  Service: General;  Laterality: Left;  excision lipoma Left Lower abdomen  . 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 Tubal ligation Left ~ 1985  . Total knee arthroplasty Left 05/08/2013    Procedure: TOTAL KNEE ARTHROPLASTY;  Surgeon: Loreta Ave, MD;  Location: [facility];  Service: Orthopedics;  Laterality: Left;   Family History  Problem Relation Age of Onset  . Colon cancer Father   . Cancer Father     colon  . Heart disease Father   . Malignant hyperthermia Neg Hx   . Heart disease Paternal Uncle   . Diabetes Sister    History  Substance Use Topics  . Smoking status: Former Smoker -- 2.00 packs/day for 35 years    Types: Cigarettes    Quit date: 08/23/2011  . Smokeless tobacco: Never Used  . Alcohol Use: No   OB History   Grav Para Term Preterm Abortions TAB SAB Ect Mult Living                 Review of Systems  All other systems reviewed and are negative.      Allergies  Sulfa antibiotics; Amoxicillin; Azithromycin; Penicillins; Propoxyphene n-acetaminophen; and Clarithromycin  Home Medications   Current Outpatient Rx  Name  Route  Sig  Dispense  Refill  .  albuterol (PROVENTIL HFA;VENTOLIN HFA) 108 (90 BASE) MCG/ACT inhaler   Inhalation   Inhale 2 puffs into the lungs every 6 (six) hours as needed. Shortness of breath         . cetirizine (ZYRTEC) 10 MG tablet   Oral   Take 10 mg by mouth every evening.          Marland Kitchen CRESTOR 20 MG tablet   Oral   Take 20 mg by mouth daily.         Marland Kitchen Dextromethorphan-Guaifenesin 10-200 MG CAPS   Oral   Take 1 capsule by mouth 4 (four) times daily as needed (for cough).         . esomeprazole (NEXIUM) 40 MG capsule   Oral   Take 40 mg by mouth daily before breakfast.         . losartan (COZAAR) 50 MG tablet   Oral   Take 1 tablet (50 mg total) by mouth daily.   30 tablet   11   . oseltamivir (TAMIFLU) 75 MG capsule   Oral   Take 75 mg by mouth 2 (two)  times daily. Patient completed therapy on 02/08/2013         . promethazine (PHENERGAN) 25 MG tablet   Oral   Take 1 tablet (25 mg total) by mouth every 6 (six) hours as needed for nausea.   12 tablet   0   . topiramate (TOPAMAX) 50 MG tablet   Oral   Take 50 mg by mouth 2 (two) times daily as needed. For headache pain          BP 129/78  Pulse 69  Temp(Src) 97.9 F (36.6 C) (Oral)  Resp 18  Ht 5\' 4"  (1.626 m)  Wt 167 lb (75.751 kg)  BMI 28.65 kg/m2  SpO2 95% Physical Exam  Nursing note and vitals reviewed. Constitutional: She is oriented to person, place, and time. She appears well-developed and well-nourished. No distress.  HENT:  Head: Normocephalic and atraumatic.  Bilateral frontal sinus ttp.  Erythema soft palate and posterior pharynx.  No tonsillar exudate.  Uvula mid-line and no trismus.   Eyes:  Normal appearance  Neck: Normal range of motion.  No meningismus  Cardiovascular: Normal rate, regular rhythm and intact distal pulses.   Pulmonary/Chest: Effort normal and breath sounds normal. She has no wheezes.  Upper airway sounds.  No rash of chest.  Tenderness L anterior chest and pain in same location w/ passive ROM LUE.    Abdominal: Soft. Bowel sounds are normal. She exhibits no distension. There is no tenderness.  obese  Musculoskeletal: Normal range of motion.  No peripheral edema or calf ttp  Lymphadenopathy:    She has no cervical adenopathy.  Neurological: She is alert and oriented to person, place, and time. No sensory deficit. Coordination normal.  CN 3-12 intact.  No nystagmus. 5/5 and equal upper and lower extremity strength.  No past pointing.     Skin: Skin is warm and dry. No rash noted.  Psychiatric: She has a normal mood and affect. Her behavior is normal.    ED Course  Procedures (including critical care time) Labs Review Labs Reviewed  [place], ED   Imaging Review Dg Chest 2  View  02/09/2014   CLINICAL DATA:  Shortness of breath, left-sided chest pain, body aches, congestion and cough.  EXAM: CHEST  2 VIEW  COMPARISON:  Chest radiograph performed 01/04/2013  FINDINGS:  The lungs are well-aerated. Minimal left basilar opacity likely reflects atelectasis. There is no evidence of pleural effusion or pneumothorax.  The heart is normal in size; the mediastinal contour is within normal limits. No acute osseous abnormalities are seen.  IMPRESSION: Minimal left basilar airspace opacity likely reflects atelectasis; lungs otherwise clear.   Electronically Signed   By: Garald Balding M.D.   On: 02/09/2014 03:01    EKG Interpretation   None       MDM   Final diagnoses:  Viral respiratory illness   67yo former smoker w/ HTN, hyperlipidemia and COPD recently treated w/ tamiflu for viral respiratory illness, and presents today w/ persistent sx.  Has had pain in L anterior chest for the past 3 days.  Despite risk factors, doubt ACS; pain atypical, in particular, it is constant and non-exertional, it is reproducible on exam, EKG non-ischemic and troponin neg.  Afebrile, VS w/in nml range, no respiratory distress and lungs clear on exam.  CXR w/out infiltrate. Pt reassured that pain is likely musculoskelatal secondary to coughing and respiratory symptoms viral.  Discussed case w/ Dr. Sabra Heck and he suggested abx d/t patient's age, h/o COPD and duration of sx.  Prescribed doxycycline as well as tussionex.  Pt has f/u w/ her PCP tomorrow.  Return precautions discussed.    Remer Macho, PA-C 02/09/14 1505

## 2014-02-09 NOTE — Discharge Instructions (Signed)
Take antibiotic as prescribed.  Take tussionex at night as needed for cough.  Do not drive within four hours of taking this medication (may cause drowsiness or confusion).   Get rest and drink plenty of fluids. Follow up with your primary care doctor as scheduled on Monday.  Return to the ER if you have worsening chest pain or develop associated shortness of breath.

## 2014-02-09 NOTE — ED Notes (Signed)
Pt states she was seen by Urgent Care dx with flu, pt states she is no better, body aches, congestion, cough with dark pink sputum. Pt took last Tamiflu today.

## 2014-02-10 ENCOUNTER — Encounter: Payer: Self-pay | Admitting: Gastroenterology

## 2014-02-10 NOTE — ED Provider Notes (Signed)
Medical screening examination/treatment/procedure(s) were performed by non-physician practitioner and as supervising physician I was immediately available for consultation/collaboration.    Johnna Acosta, MD 02/10/14 [phone]

## 2014-02-17 ENCOUNTER — Telehealth: Payer: Self-pay | Admitting: Gastroenterology

## 2014-02-18 NOTE — Telephone Encounter (Signed)
Pt is a Dr Michail Sermon pt, Left message on machine to call back regarding follow up with Dr Michail Sermon and not Dr Ardis Hughs

## 2014-02-18 NOTE — Telephone Encounter (Signed)
[phone] return call

## 2014-02-18 NOTE — Telephone Encounter (Signed)
Pt wants to switch to Dr Ardis Hughs and will have her records sent for review.  Appt will stay as scheduled

## 2014-02-21 ENCOUNTER — Ambulatory Visit (INDEPENDENT_AMBULATORY_CARE_PROVIDER_SITE_OTHER): Payer: 59 | Admitting: Physician Assistant

## 2014-02-21 VITALS — BP 126/74 | HR 78 | Temp 97.4°F | Resp 16 | Ht 61.5 in | Wt 170.0 lb

## 2014-02-21 DIAGNOSIS — L309 Dermatitis, unspecified: Secondary | ICD-10-CM

## 2014-02-21 DIAGNOSIS — L259 Unspecified contact dermatitis, unspecified cause: Secondary | ICD-10-CM

## 2014-02-21 DIAGNOSIS — R0981 Nasal congestion: Secondary | ICD-10-CM

## 2014-02-21 DIAGNOSIS — J3489 Other specified disorders of nose and nasal sinuses: Secondary | ICD-10-CM

## 2014-02-21 DIAGNOSIS — H9209 Otalgia, unspecified ear: Secondary | ICD-10-CM

## 2014-02-21 DIAGNOSIS — H9202 Otalgia, left ear: Secondary | ICD-10-CM

## 2014-02-21 MED ORDER — IPRATROPIUM BROMIDE 0.03 % NA SOLN: 2.0000 | Freq: Two times a day (BID) | NASAL | Status: DC

## 2014-02-21 MED ORDER — GUAIFENESIN ER 1200 MG PO TB12: 1.0000 | ORAL_TABLET | Freq: Two times a day (BID) | ORAL | Status: DC | PRN

## 2014-02-21 MED ORDER — VALACYCLOVIR HCL 500 MG PO TABS: ORAL_TABLET | ORAL | Status: DC

## 2014-02-21 NOTE — Patient Instructions (Signed)
Get plenty of rest and drink at least 64 ounces of water daily. 

## 2014-02-21 NOTE — Progress Notes (Signed)
Subjective:    Patient ID: Summer Jackson, female    DOB: 04/28/1946, 68 y.o.   MRN: [id number]   PCP: Elyn Peers, MD  Chief Complaint  Patient presents with  . Otalgia    left, today  . Headache    left side    Medications, allergies, past medical history, surgical history, family history, social history and problem list reviewed and updated.  Prior to Admission medications   Medication Sig Start Date End Date Taking? Authorizing Provider  albuterol (PROVENTIL HFA;VENTOLIN HFA) 108 (90 BASE) MCG/ACT inhaler Inhale 2 puffs into the lungs every 6 (six) hours as needed. Shortness of breath 09/11/12  Yes Gregor Hams, MD  cetirizine (ZYRTEC) 10 MG tablet Take 10 mg by mouth every evening.  09/14/12  Yes Barton Fanny, MD  CRESTOR 20 MG tablet Take 20 mg by mouth daily. 12/21/13  Yes Historical Provider, MD  Dextromethorphan-Guaifenesin 10-200 MG CAPS Take 1 capsule by mouth 4 (four) times daily as needed (for cough). 01/31/14  Yes Ellison Carwin, MD  esomeprazole (NEXIUM) 40 MG capsule Take 40 mg by mouth daily before breakfast.   Yes Historical Provider, MD  losartan (COZAAR) 50 MG tablet Take 1 tablet (50 mg total) by mouth daily. 04/17/13  Yes Collene Gobble, MD  promethazine (PHENERGAN) 25 MG tablet Take 1 tablet (25 mg total) by mouth every 6 (six) hours as needed for nausea. 06/17/13  Yes Elwyn Lade, PA-C  topiramate (TOPAMAX) 50 MG tablet Take 50 mg by mouth 2 (two) times daily as needed. For headache pain   Yes Historical Provider, MD    HPI  Presents with 1 day of LEFT ear pain and HA on the left.  She has a congested and runny nose.  No increased SOB from her baseline. She reports concern that her mouth and throat aren't as red as she would expect.  She also reports a new cluster of lesions on the RIGHT buttock, similar to what she had "in my crack" at her last visit here. That visit was 01/31/2014 for sudden onset of illness suspected to be influenza.  The  tender eruption was suspected herpes, though culture was negative (she has not yet been made aware of the results, so we reviewed them together).  Review of Systems As above. No vision changes.  No dizziness.    Objective:   Physical Exam  Vitals reviewed. Constitutional: She is oriented to person, place, and time. Vital signs are normal. She appears well-developed and well-nourished. She is active and cooperative. No distress.  BP 126/74  Pulse 78  Temp(Src) 97.4 F (36.3 C)  Resp 16  Ht 5' 1.5" (1.562 m)  Wt 170 lb (77.111 kg)  BMI 31.60 kg/m2  SpO2 96%   HENT:  Head: Normocephalic and atraumatic.  Right Ear: Hearing, tympanic membrane, external ear and ear canal normal.  Left Ear: Hearing, tympanic membrane, external ear and ear canal normal.  Nose: Mucosal edema and rhinorrhea present.  No foreign bodies. Right sinus exhibits no maxillary sinus tenderness and no frontal sinus tenderness. Left sinus exhibits no maxillary sinus tenderness and no frontal sinus tenderness.  Mouth/Throat: Uvula is midline, oropharynx is clear and moist and mucous membranes are normal. She has dentures (fully compensated edentula). No oral lesions. No uvula swelling. No oropharyngeal exudate, posterior oropharyngeal edema, posterior oropharyngeal erythema or tonsillar abscesses.  Eyes: Conjunctivae and EOM are normal. Pupils are equal, round, and reactive to light. Right eye exhibits no discharge.  Left eye exhibits no discharge. No scleral icterus.  Neck: Trachea normal, normal range of motion and full passive range of motion without pain. Neck supple. No mass and no thyromegaly present.  Cardiovascular: Normal rate, regular rhythm and normal heart sounds.   Pulmonary/Chest: Effort normal and breath sounds normal.  Lymphadenopathy:       Head (right side): No submandibular, no tonsillar, no preauricular, no posterior auricular and no occipital adenopathy present.       Head (left side): No submandibular,  no tonsillar, no preauricular and no occipital adenopathy present.    She has no cervical adenopathy.       Right: No supraclavicular adenopathy present.       Left: No supraclavicular adenopathy present.  Neurological: She is alert and oriented to person, place, and time. She has normal strength. No cranial nerve deficit or sensory deficit.  Skin: Skin is warm, dry and intact. No rash noted.     Psychiatric: She has a normal mood and affect. Her speech is normal and behavior is normal.          Assessment & Plan:  1. Otalgia of left ear Suspect ETD from nasal congestion.    2. Nasal congestion Given the brief duration of her symptoms, elect to avoid antibiotics now.  SHe also has multiple antibiotic allergies.  Wonder if she would benefit from a steroid nasal spray. - ipratropium (ATROVENT) 0.03 % nasal spray; Place 2 sprays into both nostrils 2 (two) times daily.  Dispense: 30 mL; Refill: 0 - Guaifenesin (MUCINEX MAXIMUM STRENGTH) 1200 MG TB12; Take 1 tablet (1,200 mg total) by mouth every 12 (twelve) hours as needed.  Dispense: 14 tablet; Refill: 1  3. Dermatitis Appearance consistent with HSV.  Would repeat culture if she can present with early lesions. - valACYclovir (VALTREX) 500 MG tablet; Take 1 tablet twice each day for 3 days, as needed for herpes.  Dispense: 30 tablet; Refill: 5   Fara Chute, PA-C Physician Assistant-Certified [facility]

## 2014-03-14 ENCOUNTER — Ambulatory Visit: Payer: Medicaid Other | Admitting: Gastroenterology

## 2014-05-13 ENCOUNTER — Ambulatory Visit (INDEPENDENT_AMBULATORY_CARE_PROVIDER_SITE_OTHER): Payer: PRIVATE HEALTH INSURANCE | Admitting: Gastroenterology

## 2014-05-13 ENCOUNTER — Encounter: Payer: Self-pay | Admitting: Gastroenterology

## 2014-05-13 VITALS — BP 110/70 | HR 70 | Ht 61.5 in | Wt 167.0 lb

## 2014-05-13 DIAGNOSIS — K219 Gastro-esophageal reflux disease without esophagitis: Secondary | ICD-10-CM

## 2014-05-13 DIAGNOSIS — K625 Hemorrhage of anus and rectum: Secondary | ICD-10-CM

## 2014-05-13 DIAGNOSIS — Z800 Family history of malignant neoplasm of digestive organs: Secondary | ICD-10-CM

## 2014-05-13 NOTE — Patient Instructions (Addendum)
One of your biggest health concerns is your smoking.  This increases your risk for most cancers and serious cardiovascular diseases such as strokes, heart attacks.  You should try your best to stop.  If you need assistance, please contact your PCP or Smoking Cessation Class at [facility] [phone]) or [place] [facility] (1-800-QUIT-NOW). Try desonex or "butt paste" to your backside irritation. Recall colonoscopy in 02/2017 for FH of colon cancer. Retry OTC nexium.  One pill 20-30 min before breakfast and dinner meals.

## 2014-05-13 NOTE — Progress Notes (Signed)
HPI: This is a   pleasant 68 year old woman who is switching her care from [facility] gastroenterology.   Colonoscopy January 2013, Dr. Evette Doffing schooler, done for rectal bleeding. He described a poor prep and left-sided diverticulosis. He ordered a followup area of enema due to her poor prep and it was normal 2013. Her bleeding he felt were from itnernal hemorrhoids which she noted during the colonoscopy. He recommended high fiber diet.  Colonoscopy 2007 Dr. Michail Sermon for [place] of colon cancer: found diverticulosis, no polyps  Cbc 02/2014 showed Hb 11.9, normocytic.  She has been having rectal bleeding for "a long time." for at least 2 years, this is why the colon  Father had stomach cancer, but "probably was colon cancer."  He died in early 49s, shortly after diagnosed with the cancer.  She also believes he had aneurism.  She has lost 10 pounds in 6 months, intentionally.  She also mentioned GERD symptoms that a single Nexium is not helping. She has no alarm symptoms. She describes to GERD as a burning in her chest.  She is not really ever constipated.  She will see blood in underpants about 2-3 times per month.      Review of systems: Pertinent positive and negative review of systems were noted in the above HPI section. Complete review of systems was performed and was otherwise normal.    Past Medical History  Diagnosis Date  . Hypertension   . Hyperlipemia   . GERD (gastroesophageal reflux disease)   . Rectal bleeding   . Blood transfusion ~ 1955    "before heart OR" (05/09/2013)  . COPD (chronic obstructive pulmonary disease)   . Weight loss   . Nasal congestion   . Wheezing   . Asthma   . Hemorrhoids   . Urinary frequency   . Hypertension   . Exertional shortness of breath   . History of stomach ulcers 1990's  . Migraines   . Arthritis     "knees" (05/09/2013)    Past Surgical History  Procedure Laterality Date  . Heart surgery at 68yr  1955    "blood was going thru my  lungs but not thru my heart like it should" (05/09/2013)  . Appendectomy  1970's  . Colonoscopy    . Left oophorectomy Left ~ 1985  . Colonoscopy  12/22/2011    Procedure: COLONOSCOPY;  Surgeon: Lear Ng, MD;  Location: [facility];  Service: Endoscopy;  Laterality: N/A;  . Tumor excision Left ~ 1984    "under breast" (05/09/2013)  . Knee arthroscopy Left ~ 2004; ~ 2006  . Lipoma excision  02/15/2012    Procedure: EXCISION LIPOMA;  Surgeon: Joyice Faster. Cornett, MD;  Location: [place];  Service: General;  Laterality: Left;  excision lipoma Left Lower abdomen  . 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 Tubal ligation Left ~ 1985  . Total knee arthroplasty Left 05/08/2013    Procedure: TOTAL KNEE ARTHROPLASTY;  Surgeon: Ninetta Lights, MD;  Location: [place];  Service: Orthopedics;  Laterality: Left;    Current Outpatient Prescriptions  Medication Sig Dispense Refill  . albuterol (PROVENTIL HFA;VENTOLIN HFA) 108 (90 BASE) MCG/ACT inhaler Inhale 2 puffs into the lungs every 6 (six) hours as needed. Shortness of breath      . cetirizine (ZYRTEC) 10 MG tablet Take 10 mg by mouth every evening.       Marland Kitchen CRESTOR 20 MG tablet Take 20 mg by mouth daily.      Marland Kitchen  esomeprazole (NEXIUM) 40 MG capsule Take 40 mg by mouth daily before breakfast.      . ipratropium (ATROVENT) 0.03 % nasal spray Place 2 sprays into both nostrils 2 (two) times daily.  30 mL  0  . losartan (COZAAR) 50 MG tablet Take 1 tablet (50 mg total) by mouth daily.  30 tablet  11  . promethazine (PHENERGAN) 25 MG tablet Take 1 tablet (25 mg total) by mouth every 6 (six) hours as needed for nausea.  12 tablet  0  . topiramate (TOPAMAX) 50 MG tablet Take 50 mg by mouth 2 (two) times daily as needed. For headache pain      . valACYclovir (VALTREX) 500 MG tablet Take 1 tablet twice each day for 3 days, as needed for herpes.  30 tablet  5   No current facility-administered medications for this visit.    Allergies as  of 05/13/2014 - Review Complete 05/13/2014  Allergen Reaction Noted  . Sulfa antibiotics Swelling and Rash 04/11/2012  . Amoxicillin Other (See Comments) 11/10/2010  . Azithromycin Other (See Comments) 02/26/2008  . Penicillins Other (See Comments) 09/28/2007  . Propoxyphene n-acetaminophen Other (See Comments) 11/10/2010  . Clarithromycin Rash 11/19/2010    Family History  Problem Relation Age of Onset  . Colon cancer Father   . Cancer Father     colon  . Heart disease Father   . Malignant hyperthermia Neg Hx   . Heart disease Paternal Uncle   . Diabetes Sister     History   Social History  . Marital Status: Divorced    Spouse Name: N/A    Number of Children: 2  . Years of Education: N/A   Occupational History  . cleaner     cleans apts and homes   Social History Main Topics  . Smoking status: Current Some Day Smoker -- 2.00 packs/day for 35 years    Types: Cigarettes  . Smokeless tobacco: Never Used  . Alcohol Use: No  . Drug Use: No  . Sexual Activity: Yes    Birth Control/ Protection: None   Other Topics Concern  . Not on file   Social History Narrative   Lives with her daughter's father.       Physical Exam: BP 110/70  Pulse 70  Ht 5' 1.5" (1.562 m)  Wt 167 lb (75.751 kg)  BMI 31.05 kg/m2 Constitutional: generally well-appearing Psychiatric: alert and oriented x3 Eyes: extraocular movements intact Mouth: oral pharynx moist, no lesions Neck: supple no lymphadenopathy Cardiovascular: heart regular rate and rhythm Lungs: clear to auscultation bilaterally Abdomen: soft, nontender, nondistended, no obvious ascites, no peritoneal signs, normal bowel sounds Extremities: no lower extremity edema bilaterally Skin: no lesions on visible extremities Rectal examination with female assistant in the room: No external hemorrhoids, no distal rectal masses, stool is brown and and not checked for Hemoccult. She had slightly excoriatedperianal skin along the  crease of her buttocks.   Assessment and plan: 68 y.o. female with mild rectal bleeding from either internal hemorrhoids or excoriated perianal skin, family history colon cancer, Chronic GERD without alarm symptoms,   First I recommended Desenex, butt  fpast  for the excoriated perianal skin. I reassured her that she is not anemic. She had a colonoscopy 2 years ago I do not think that needs to be repeated now. Will put her in our recall system. Repeat colonoscopy in about 3 years from now. She is going to increase her proton pump inhibitor to twice daily. She also  knows taht losing weight and quitting smoking can also help her GERD.

## 2014-06-07 IMAGING — CR DG KNEE 1-2V PORT*L*
1 series · 1 of 1 positions shown · non-contrast
Comparison: 09/03/2009.

CLINICAL DATA: Postop left knee.

PORTABLE LEFT KNEE - 1-2 VIEW

[AP]
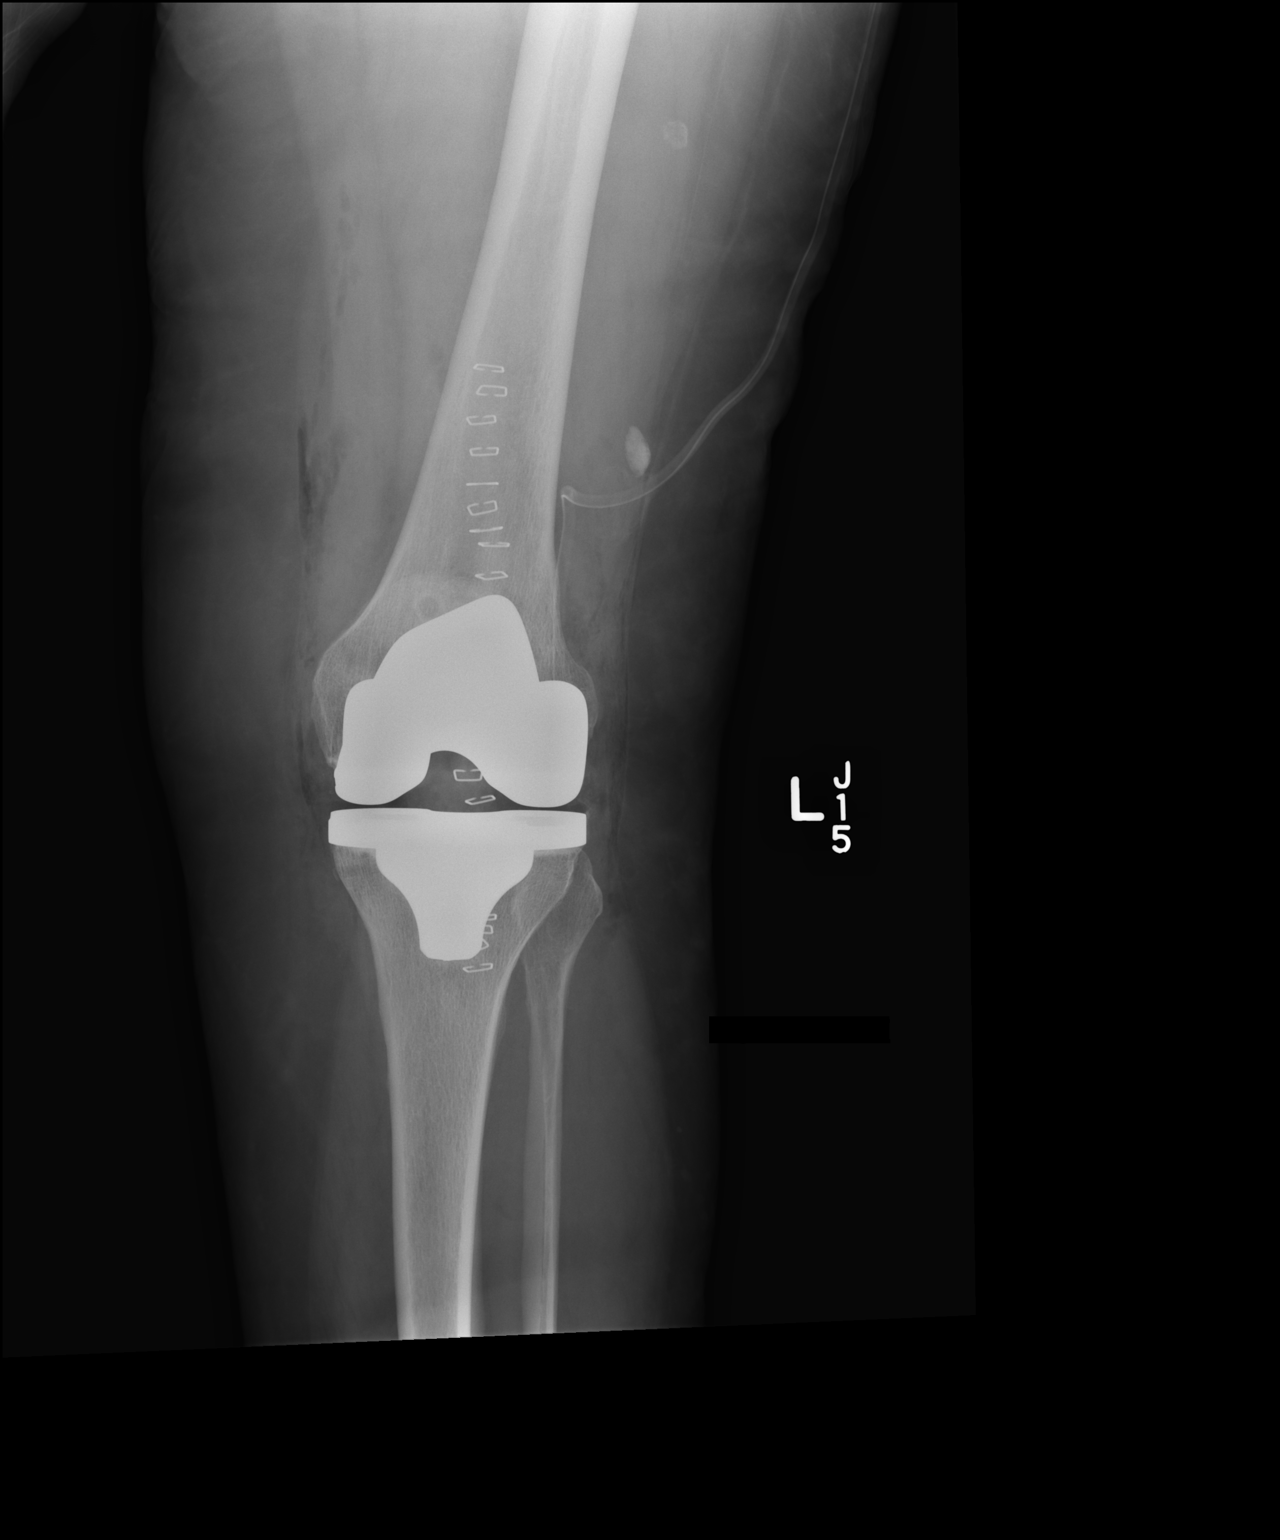

[1 of 1 positions shown; findings below may reference images not displayed]

FINDINGS: Post total left knee replacement which appears in
satisfactory position without complication noted.  Surgical drain
is in place.

Radiopaque structures lateral aspect of the lower thigh unchanged.
IMPRESSION: Total knee replacement appears in satisfactory position without
complication noted.

## 2014-07-30 ENCOUNTER — Ambulatory Visit: Payer: 59 | Admitting: Podiatry

## 2014-08-06 ENCOUNTER — Telehealth: Payer: Self-pay | Admitting: Emergency Medicine

## 2014-08-06 NOTE — Telephone Encounter (Signed)
Made in error

## 2014-08-09 ENCOUNTER — Emergency Department (HOSPITAL_COMMUNITY)
Admission: EM | Admit: 2014-08-09 | Discharge: 2014-08-09 | Disposition: A | Discharge status: Home / Self Care | Payer: PRIVATE HEALTH INSURANCE | Attending: Emergency Medicine | Admitting: Emergency Medicine

## 2014-08-09 ENCOUNTER — Emergency Department (HOSPITAL_COMMUNITY): Payer: PRIVATE HEALTH INSURANCE

## 2014-08-09 ENCOUNTER — Encounter (HOSPITAL_COMMUNITY): Payer: Self-pay | Admitting: Emergency Medicine

## 2014-08-09 DIAGNOSIS — Z8719 Personal history of other diseases of the digestive system: Secondary | ICD-10-CM | POA: Insufficient documentation

## 2014-08-09 DIAGNOSIS — G43909 Migraine, unspecified, not intractable, without status migrainosus: Secondary | ICD-10-CM | POA: Insufficient documentation

## 2014-08-09 DIAGNOSIS — F17200 Nicotine dependence, unspecified, uncomplicated: Secondary | ICD-10-CM | POA: Diagnosis not present

## 2014-08-09 DIAGNOSIS — I10 Essential (primary) hypertension: Secondary | ICD-10-CM | POA: Insufficient documentation

## 2014-08-09 DIAGNOSIS — J4489 Other specified chronic obstructive pulmonary disease: Secondary | ICD-10-CM | POA: Insufficient documentation

## 2014-08-09 DIAGNOSIS — Z880 Allergy status to penicillin: Secondary | ICD-10-CM | POA: Diagnosis not present

## 2014-08-09 DIAGNOSIS — J449 Chronic obstructive pulmonary disease, unspecified: Secondary | ICD-10-CM | POA: Diagnosis not present

## 2014-08-09 DIAGNOSIS — Z8739 Personal history of other diseases of the musculoskeletal system and connective tissue: Secondary | ICD-10-CM | POA: Insufficient documentation

## 2014-08-09 DIAGNOSIS — Z79899 Other long term (current) drug therapy: Secondary | ICD-10-CM | POA: Insufficient documentation

## 2014-08-09 DIAGNOSIS — G43709 Chronic migraine without aura, not intractable, without status migrainosus: Secondary | ICD-10-CM

## 2014-08-09 DIAGNOSIS — E785 Hyperlipidemia, unspecified: Secondary | ICD-10-CM | POA: Diagnosis not present

## 2014-08-09 LAB — CBC
HCT: 37.7 % (ref 36.0–46.0)
Hemoglobin: 12.6 g/dL (ref 12.0–15.0)
MCH: 28.7 pg (ref 26.0–34.0)
MCHC: 33.4 g/dL (ref 30.0–36.0)
MCV: 85.9 fL (ref 78.0–100.0)
Platelets: 231 10*3/uL (ref 150–400)
RBC: 4.39 MIL/uL (ref 3.87–5.11)
RDW: 13.5 % (ref 11.5–15.5)
WBC: 8.8 10*3/uL (ref 4.0–10.5)

## 2014-08-09 LAB — BASIC METABOLIC PANEL
Anion gap: 15 (ref 5–15)
BUN: 13 mg/dL (ref 6–23)
CO2: 24 mEq/L (ref 19–32)
Calcium: 8.9 mg/dL (ref 8.4–10.5)
Chloride: 100 mEq/L (ref 96–112)
Creatinine, Ser: 0.74 mg/dL (ref 0.50–1.10)
GFR calc Af Amer: >90 mL/min (ref >90)
GFR calc non Af Amer: 87 mL/min — ABNORMAL LOW (ref >90)
Glucose, Bld: 88 mg/dL (ref 70–99)
Potassium: 3 mEq/L — ABNORMAL LOW (ref 3.7–5.3)
Sodium: 139 mEq/L (ref 137–147)

## 2014-08-09 MED ORDER — DEXAMETHASONE SODIUM PHOSPHATE 10 MG/ML IJ SOLN
10.0000 mg | Freq: Once | INTRAMUSCULAR | Status: AC
  Administered 2014-08-09: 10 mg via INTRAVENOUS
  Filled 2014-08-09: qty 1

## 2014-08-09 MED ORDER — DIPHENHYDRAMINE HCL 50 MG/ML IJ SOLN
25.0000 mg | Freq: Once | INTRAMUSCULAR | Status: AC
  Administered 2014-08-09: 25 mg via INTRAVENOUS
  Filled 2014-08-09: qty 1

## 2014-08-09 MED ORDER — KETOROLAC TROMETHAMINE 30 MG/ML IJ SOLN
30.0000 mg | Freq: Once | INTRAMUSCULAR | Status: AC
  Administered 2014-08-09: 30 mg via INTRAVENOUS
  Filled 2014-08-09: qty 1

## 2014-08-09 MED ORDER — METOCLOPRAMIDE HCL 5 MG/ML IJ SOLN
10.0000 mg | Freq: Once | INTRAMUSCULAR | Status: AC
  Administered 2014-08-09: 10 mg via INTRAVENOUS
  Filled 2014-08-09: qty 2

## 2014-08-09 NOTE — ED Notes (Signed)
Pt w/ hx of migraine c/o migraine x 2 days w/ vomiting.  Sensitivity to light and sound.

## 2014-08-09 NOTE — ED Provider Notes (Signed)
CSN: [id number]     Arrival date & time 08/09/14  1646 History   First MD Initiated Contact with Patient 08/09/14 1856     Chief Complaint  Patient presents with  . Migraine     (Consider location/radiation/quality/duration/timing/severity/associated sxs/prior Treatment) Patient is a 68 y.o. female presenting with migraines. The history is provided by the patient.  Migraine This is a chronic problem. The current episode started 2 days ago. The problem occurs constantly. The problem has not changed since onset.Pertinent negatives include no abdominal pain and no shortness of breath. Exacerbated by: light and sound. Nothing relieves the symptoms.    Past Medical History  Diagnosis Date  . Hypertension   . Hyperlipemia   . GERD (gastroesophageal reflux disease)   . Rectal bleeding   . Blood transfusion ~ 1955    "before heart OR" (05/09/2013)  . COPD (chronic obstructive pulmonary disease)   . Weight loss   . Nasal congestion   . Wheezing   . Asthma   . Hemorrhoids   . Urinary frequency   . Hypertension   . Exertional shortness of breath   . History of stomach ulcers 1990's  . Migraines   . Arthritis     "knees" (05/09/2013)   Past Surgical History  Procedure Laterality Date  . Heart surgery at 62yr  1955    "blood was going thru my lungs but not thru my heart like it should" (05/09/2013)  . Appendectomy  1970's  . Colonoscopy    . Left oophorectomy Left ~ 1985  . Colonoscopy  12/22/2011    Procedure: COLONOSCOPY;  Surgeon: Lear Ng, MD;  [facility]: [facility];  Service: Endoscopy;  Laterality: N/A;  . Tumor excision Left ~ 1984    "under breast" (05/09/2013)  . Knee arthroscopy Left ~ 2004; ~ 2006  . Lipoma excision  02/15/2012    Procedure: EXCISION LIPOMA;  Surgeon: Joyice Faster. Cornett, MD;  Location: [place];  Service: General;  Laterality: Left;  excision lipoma Left Lower abdomen  . 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 Tubal ligation  Left ~ 1985  . Total knee arthroplasty Left 05/08/2013    Procedure: TOTAL KNEE ARTHROPLASTY;  Surgeon: Ninetta Lights, MD;  Location: [place];  Service: Orthopedics;  Laterality: Left;   Family History  Problem Relation Age of Onset  . Colon cancer Father   . Cancer Father     colon  . Heart disease Father   . Malignant hyperthermia Neg Hx   . Heart disease Paternal Uncle   . Diabetes Sister    History  Substance Use Topics  . Smoking status: Current Some Day Smoker -- 2.00 packs/day for 35 years    Types: Cigarettes  . Smokeless tobacco: Never Used  . Alcohol Use: No   OB History   Grav Para Term Preterm Abortions TAB SAB Ect Mult Living                 Review of Systems  Constitutional: Negative for fever and chills.  Respiratory: Negative for cough and shortness of breath.   Gastrointestinal: Negative for vomiting and abdominal pain.  All other systems reviewed and are negative.     Allergies  Sulfa antibiotics; Amoxicillin; Azithromycin; Penicillins; Propoxyphene n-acetaminophen; and Clarithromycin  Home Medications   Prior to Admission medications   Medication Sig Start Date End Date Taking? Authorizing Provider  albuterol (PROVENTIL HFA;VENTOLIN HFA) 108 (90 BASE) MCG/ACT inhaler Inhale 2 puffs into the lungs  every 6 (six) hours as needed. Shortness of breath 09/11/12  Yes Gregor Hams, MD  CRESTOR 20 MG tablet Take 20 mg by mouth daily. 12/21/13  Yes Historical Provider, MD  losartan (COZAAR) 50 MG tablet Take 1 tablet (50 mg total) by mouth daily. 04/17/13  Yes Collene Gobble, MD  promethazine (PHENERGAN) 25 MG tablet Take 1 tablet (25 mg total) by mouth every 6 (six) hours as needed for nausea. 06/17/13  Yes Elwyn Lade, PA-C  topiramate (TOPAMAX) 50 MG tablet Take 50 mg by mouth 2 (two) times daily as needed. For headache pain   Yes Historical Provider, MD   BP 128/79  Pulse 76  Temp(Src) 98.1 F (36.7 C) (Oral)  Resp 18  SpO2 100% Physical Exam   Nursing note and vitals reviewed. Constitutional: She is oriented to person, place, and time. She appears well-developed and well-nourished. No distress.  HENT:  Head: Normocephalic and atraumatic.  Mouth/Throat: Oropharynx is clear and moist.  Eyes: EOM are normal. Pupils are equal, round, and reactive to light.  Neck: Normal range of motion. Neck supple.  Cardiovascular: Normal rate and regular rhythm.  Exam reveals no friction rub.   No murmur heard. Pulmonary/Chest: Effort normal and breath sounds normal. No respiratory distress. She has no wheezes. She has no rales.  Abdominal: Soft. She exhibits no distension. There is no tenderness. There is no rebound.  Musculoskeletal: Normal range of motion. She exhibits no edema.  Neurological: She is alert and oriented to person, place, and time. No cranial nerve deficit. She exhibits normal muscle tone.  Skin: She is not diaphoretic.    ED Course  Procedures (including critical care time) Labs Review Labs Reviewed  CBC  BASIC METABOLIC PANEL    Imaging Review Ct Head Wo Contrast  08/09/2014   CLINICAL DATA:  Migraine-type headache with photophobia and nausea  EXAM: CT HEAD WITHOUT CONTRAST  TECHNIQUE: Contiguous axial images were obtained from the base of the skull through the vertex without intravenous contrast.  COMPARISON:  October 23, 2010  FINDINGS: The ventricles are normal in size and configuration. There is evidence of a degree of empty sella. There is no mass, hemorrhage, extra-axial fluid collection, or midline shift. There is mild small vessel disease in the corona radiata anteriorly bilaterally. There is no new gray-white compartment lesion. No apparent acute infarct. Bony calvarium appears intact. The mastoid air cells are clear. There is mucosal thickening in the lateral right maxillary antrum.  IMPRESSION: Mild periventricular small vessel disease, stable. No intracranial mass, hemorrhage, or acute appearing infarct. Mild empty  sella. Mild right maxillary sinus disease.   Electronically Signed   By: Lowella Grip M.D.   On: 08/09/2014 20:31     EKG Interpretation None      MDM   Final diagnoses:  Chronic migraine without aura without status migrainosus, not intractable    73F here with migraine. Hx of HA, gradual onset 2 days ago. Somewhat similar to prior migraines. No fevers. Associated vomiting, photophobia, and phonophobia. Neurologically intact here. Will CT her head and obtain labs.  CT ok. Feeling better after meds, stable for discharge.  Evelina Bucy, MD 08/10/14 [phone]

## 2014-08-09 NOTE — Discharge Instructions (Signed)

## 2014-08-09 NOTE — ED Notes (Signed)
Bed: [facility] Expected date:  Expected time:  Means of arrival:  Comments: 

## 2014-08-20 ENCOUNTER — Encounter: Payer: Self-pay | Admitting: Emergency Medicine

## 2014-08-20 ENCOUNTER — Ambulatory Visit (INDEPENDENT_AMBULATORY_CARE_PROVIDER_SITE_OTHER): Payer: Medicare Other | Admitting: Emergency Medicine

## 2014-08-20 VITALS — BP 142/80 | HR 68 | Ht 61.0 in | Wt 175.0 lb

## 2014-08-20 DIAGNOSIS — R05 Cough: Secondary | ICD-10-CM

## 2014-08-20 DIAGNOSIS — R053 Chronic cough: Secondary | ICD-10-CM

## 2014-08-20 DIAGNOSIS — R059 Cough, unspecified: Secondary | ICD-10-CM

## 2014-08-20 NOTE — Assessment & Plan Note (Signed)
Due to rhinitis and GERD. Her gerd has been refractory to PPI + H2 blockade. She mentions a possible hiatal hernia. Will refer her to GI for further eval . ACE-i changed to ARB. Her PFT are reassuring.

## 2014-08-20 NOTE — Progress Notes (Signed)
Subjective:    Patient ID: Summer Jackson, female    DOB: 1946/06/12, 68 y.o.   MRN: [id number]  HPI 30 former tobacco (38 pk-yrs, quit 2012), HTN, GERD, nasal congestion. Probable COPD with spiro done 01/10/13 at her PCP Dr Summer Jackson office. No curves available but raw data looks like mixed disease with a moderate decrease in FEV1 at 1.74L. The eval initiated due to persistent cough, occasional wheeze, exertional SOB. She c/o persistent cough, productive of thick tan mucous, GERD that has been refractory to PPI + H2 blocker. She is on zyrtec. She is on ACE-I.   She has been started on Spiriva , QVAR, about 1 month ago. She has been on albuterol for over 2 years, using prn with relief of exertional SOB and wheeze. She feels that the spiriva + QVAR may have helped >> less wheeze, better exertional tolerance.   She is interested in pulm rehab  ROV 03/12/13 -- follows up for COPD and chronic cough. We temporarily stopped QVAR for UA issues, continued spiriva. She is not sure that the spiriva is doing much for her. PFT 2/25 >> possible subtle AFL. She is c/o GERD that is breaking through meds (Dexilant + pepcid). She is on lisinopril. Taking zyrtec. She doesn't have a PCP, is asking for help obtaining one.   ROV 04/17/13 -- ? COPD and chronic cough. Summer Jackson has shown possible subtle AFL, but most of her sx have involved cough. We started fluticasone + zyrtec, dexilant + pepcid for GERD, did not see GI. She still gets heartburn. She is coughing much less, but still has the cough. Breathing well. Still on ACE-I.   ROV 08/20/14 -- follow up visit for cough. She has been treated for GERD and rhinitis as above. Last time changed ACE-i to cozaar. She has very subtle AFL on spiro. She continues to have hoarse voice, cough may be a little better. She still has severe GERD - not clear that she is taking her PPI reliably. Not on pepcid regularly.  She is concerned about her exercise tolerance    Review of Systems   Constitutional: Negative for fever and unexpected weight change.  HENT: Positive for sneezing. Negative for congestion, dental problem, ear pain, nosebleeds, postnasal drip, rhinorrhea, sinus pressure, sore throat and trouble swallowing.   Eyes: Negative for redness and itching.  Respiratory: Positive for cough, chest tightness, shortness of breath and wheezing.   Cardiovascular: Negative for palpitations and leg swelling.  Gastrointestinal: Negative for nausea and vomiting.  Genitourinary: Negative for dysuria.  Musculoskeletal: Negative for joint swelling.  Skin: Negative for rash.  Neurological: Positive for headaches.  Hematological: Does not bruise/bleed easily.  Psychiatric/Behavioral: Negative for dysphoric mood. The patient is not nervous/anxious.       Objective:   Physical Exam Filed Vitals:   08/20/14 1128  BP: 142/80  Pulse: 68   Gen: Pleasant, overwt, in no distress,  normal affect  ENT: No lesions,  mouth clear,  oropharynx clear, no postnasal drip, hoarse voice  Neck: No JVD, no TMG, no carotid bruits  Lungs: No use of accessory muscles, soft B basilar insp crackles, no wheeze  Cardiovascular: RRR, heart sounds normal, no murmur or gallops, no peripheral edema  Musculoskeletal: No deformities, no cyanosis or clubbing  Neuro: alert, non focal  Skin: Warm, no lesions or rashes     Assessment & Plan:  Chronic cough Due to rhinitis and GERD. Her gerd has been refractory to PPI + H2 blockade. She mentions a  possible hiatal hernia. Will refer her to GI for further eval . ACE-i changed to ARB. Her PFT are reassuring.

## 2014-08-20 NOTE — Patient Instructions (Signed)
Please continue your dexilant once a day Continue cozaar every day Take zyrtec once a day Restart fluticasone nasal spray, 2 sprays each nostril daily We will you to see a gastroenterologist because your reflux has not improved on the usual medications.  Follow with Dr Lamonte Sakai in 6 months or sooner if you have any problems

## 2014-08-29 ENCOUNTER — Other Ambulatory Visit (HOSPITAL_COMMUNITY): Payer: Self-pay | Admitting: Family Medicine

## 2014-08-29 DIAGNOSIS — Z1231 Encounter for screening mammogram for malignant neoplasm of breast: Secondary | ICD-10-CM

## 2014-09-03 ENCOUNTER — Ambulatory Visit (INDEPENDENT_AMBULATORY_CARE_PROVIDER_SITE_OTHER): Payer: 59 | Admitting: Emergency Medicine

## 2014-09-03 VITALS — BP 152/100 | HR 81 | Temp 97.5°F | Resp 16 | Ht 62.0 in | Wt 174.4 lb

## 2014-09-03 DIAGNOSIS — G43109 Migraine with aura, not intractable, without status migrainosus: Secondary | ICD-10-CM

## 2014-09-03 MED ORDER — KETOROLAC TROMETHAMINE 60 MG/2ML IM SOLN
60.0000 mg | Freq: Once | INTRAMUSCULAR | Status: AC
  Administered 2014-09-03: 60 mg via INTRAMUSCULAR

## 2014-09-03 MED ORDER — PROMETHAZINE HCL 25 MG/ML IJ SOLN
25.0000 mg | Freq: Once | INTRAMUSCULAR | Status: AC
  Administered 2014-09-03: 25 mg via INTRAMUSCULAR

## 2014-09-03 NOTE — Progress Notes (Addendum)
[facility] [address] [place] [address] [phone]  Date:  09/03/2014   Name:  Summer Jackson   DOB:  25-Oct-1946   MRN:  [id number]  PCP:  Elyn Peers, MD    Chief Complaint: Nausea, Emesis and Migraine   History of Present Illness:  Summer Jackson is a 68 y.o. very pleasant female patient who presents with the following:  Patient with a history of migraines.  Prophylaxed with topamax which she does not take every day as prescribed, but rather as a treatment. Says her headache is present for the past three days and is across her face and over the top of her head to the neck. She is photophobic and nauseated.  Vomited several times.  No fever or chills no history of antecedent illness or injury.  Says headache is typical of her usual headache. No improvement with over the counter medications or other home remedies.  Denies other complaint or health concern today.    Patient Active Problem List   Diagnosis Date Noted  . Constipation 05/29/2013  . Allergy to pollen 05/29/2013  . Hypertension   . Hyperlipemia   . Headache(784.0)   . GERD (gastroesophageal reflux disease)   . Weight loss   . Nasal congestion   . Left shoulder pain 02/05/2013  . Chronic cough 01/24/2013  . Prediabetes 01/10/2013  . Lipoma of abdominal wall 01/23/2012  . Hemorrhoids 01/23/2012  . Rectal bleeding 12/22/2011  . Family history of malignant neoplasm of gastrointestinal tract 12/22/2011  . OSTEOARTHRITIS, KNEE, LEFT 09/10/2009  . GERD 08/25/2009  . SHINGLES, RECURRENT 09/30/2008  . HYPERLIPIDEMIA 08/25/2007  . COMMON MIGRAINE 08/09/2007  . HYPERTENSION 08/09/2007  . CONGENITAL HEART DISEASE, 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    Past Medical History  Diagnosis Date  . Hypertension   . Hyperlipemia   . GERD (gastroesophageal reflux disease)   . Rectal bleeding   . Blood transfusion ~ 1955    "before heart OR" (05/09/2013)  . COPD (chronic obstructive pulmonary disease)    . Weight loss   . Nasal congestion   . Wheezing   . Asthma   . Hemorrhoids   . Urinary frequency   . Hypertension   . Exertional shortness of breath   . History of stomach ulcers 1990's  . Migraines   . Arthritis     "knees" (05/09/2013)    Past Surgical History  Procedure Laterality Date  . Heart surgery at 64yr  1955    "blood was going thru my lungs but not thru my heart like it should" (05/09/2013)  . Appendectomy  1970's  . Colonoscopy    . Left oophorectomy Left ~ 1985  . Colonoscopy  12/22/2011    Procedure: COLONOSCOPY;  Surgeon: Lear Ng, MD;  Location: [facility];  Service: Endoscopy;  Laterality: N/A;  . Tumor excision Left ~ 1984    "under breast" (05/09/2013)  . Knee arthroscopy Left ~ 2004; ~ 2006  . Lipoma excision  02/15/2012    Procedure: EXCISION LIPOMA;  Surgeon: Joyice Faster. Cornett, MD;  Location: [place];  Service: General;  Laterality: Left;  excision lipoma Left Lower abdomen  . 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 Tubal ligation Left ~ 1985  . Total knee arthroplasty Left 05/08/2013    Procedure: TOTAL KNEE ARTHROPLASTY;  Surgeon: Ninetta Lights, MD;  Location: [place];  Service: Orthopedics;  Laterality: Left;    History  Substance Use Topics  .  Smoking status: Former Smoker -- 2.00 packs/day for 35 years    Types: Cigarettes    Quit date: 08/20/2013  . Smokeless tobacco: Never Used     Comment: Currently exposed to 2nd hand smoke in home   . Alcohol Use: No    Family History  Problem Relation Age of Onset  . Colon cancer Father   . Cancer Father     colon  . Heart disease Father   . Malignant hyperthermia Neg Hx   . Heart disease Paternal Uncle   . Diabetes Sister     Allergies  Allergen Reactions  . Sulfa Antibiotics Swelling and Rash  . Amoxicillin Other (See Comments)    REACTION: headache  . Azithromycin Other (See Comments)    REACTION: reddness around mouth  . Penicillins Other (See Comments)     REACTION: headache  . Propoxyphene N-Acetaminophen Other (See Comments)    REACTION: mouth sores  . Clarithromycin Rash    REACTION: skin rash with minimal vesicular formation on arms and hands    Medication list has been reviewed and updated.  Current Outpatient Prescriptions on File Prior to Visit  Medication Sig Dispense Refill  . albuterol (PROVENTIL HFA;VENTOLIN HFA) 108 (90 BASE) MCG/ACT inhaler Inhale 2 puffs into the lungs every 6 (six) hours as needed. Shortness of breath      . CRESTOR 20 MG tablet Take 20 mg by mouth daily.      Marland Kitchen losartan (COZAAR) 50 MG tablet Take 1 tablet (50 mg total) by mouth daily.  30 tablet  11  . promethazine (PHENERGAN) 25 MG tablet Take 1 tablet (25 mg total) by mouth every 6 (six) hours as needed for nausea.  12 tablet  0  . topiramate (TOPAMAX) 50 MG tablet Take 50 mg by mouth 2 (two) times daily as needed. For headache pain       No current facility-administered medications on file prior to visit.    Review of Systems:  As per HPI, otherwise negative.    Physical Examination: Filed Vitals:   09/03/14 1842  BP: 202/100  Pulse: 81  Temp: 97.5 F (36.4 C)  Resp: 16   Filed Vitals:   09/03/14 1842  Height: 5\' 2"  (1.575 m)  Weight: 174 lb 6.4 oz (79.107 kg)   Body mass index is 31.89 kg/(m^2). Ideal Body Weight: Weight in (lb) to have BMI = 25: 136.4  GEN: WDWN, moderate distress, Non-toxic, A & O x 3 HEENT: Atraumatic, Normocephalic. Neck supple. No masses, No LAD. Ears and Nose: No external deformity. CV: RRR, No M/G/R. No JVD. No thrill. No extra heart sounds. PULM: CTA B, no wheezes, crackles, rhonchi. No retractions. No resp. distress. No accessory muscle use. ABD: S, NT, ND, +BS. No rebound. No HSM. EXTR: No c/c/e NEURO Normal gait. PRRERLA EOMI CN 2-12 intact PSYCH: Normally interactive. Conversant. Not depressed or anxious appearing.  Calm demeanor.    Assessment and  Plan: Migraine toradol Phenergan Decadron Elevated BP.   Follow up with family physician  Signed,  Ellison Carwin, MD   Little improvement with medication.  Home and to ER for new or worsened symptoms.

## 2014-09-03 NOTE — Patient Instructions (Signed)
Migraine Migraine Headache A migraine headache is an intense, throbbing pain on one or both sides of your head. A migraine can last for 30 minutes to several hours. CAUSES  The exact cause of a migraine headache is not always known. However, a migraine may be caused when nerves in the brain become irritated and release chemicals that cause inflammation. This causes pain. Certain things may also trigger migraines, such as:  Alcohol.  Smoking.  Stress.  Menstruation.  Aged cheeses.  Foods or drinks that contain nitrates, glutamate, aspartame, or tyramine.  Lack of sleep.  Chocolate.  Caffeine.  Hunger.  Physical exertion.  Fatigue.  Medicines used to treat chest pain (nitroglycerine), birth control pills, estrogen, and some blood pressure medicines. SIGNS AND SYMPTOMS  Pain on one or both sides of your head.  Pulsating or throbbing pain.  Severe pain that prevents daily activities.  Pain that is aggravated by any physical activity.  Nausea, vomiting, or both.  Dizziness.  Pain with exposure to bright lights, loud noises, or activity.  General sensitivity to bright lights, loud noises, or smells. Before you get a migraine, you may get warning signs that a migraine is coming (aura). An aura may include:  Seeing flashing lights.  Seeing bright spots, halos, or zigzag lines.  Having tunnel vision or blurred vision.  Having feelings of numbness or tingling.  Having trouble talking.  Having muscle weakness. DIAGNOSIS  A migraine headache is often diagnosed based on:  Symptoms.  Physical exam.  A CT scan or MRI of your head. These imaging tests cannot diagnose migraines, but they can help rule out other causes of headaches. TREATMENT Medicines may be given for pain and nausea. Medicines can also be given to help prevent recurrent migraines.  HOME CARE INSTRUCTIONS  Only take over-the-counter or prescription medicines for pain or discomfort as directed  by your health care provider. The use of long-term narcotics is not recommended.  Lie down in a dark, quiet room when you have a migraine.  Keep a journal to find out what may trigger your migraine headaches. For example, write down:  What you eat and drink.  How much sleep you get.  Any change to your diet or medicines.  Limit alcohol consumption.  Quit smoking if you smoke.  Get 7-9 hours of sleep, or as recommended by your health care provider.  Limit stress.  Keep lights dim if bright lights bother you and make your migraines worse. SEEK IMMEDIATE MEDICAL CARE IF:   Your migraine becomes severe.  You have a fever.  You have a stiff neck.  You have vision loss.  You have muscular weakness or loss of muscle control.  You start losing your balance or have trouble walking.  You feel faint or pass out.  You have severe symptoms that are different from your first symptoms. MAKE SURE YOU:   Understand these instructions.  Will watch your condition.  Will get help right away if you are not doing well or get worse. Document Released: 12/05/2005 Document Revised: 04/21/2014 Document Reviewed: 08/12/2013 [facility] Patient Information 2015 [place], [place]. This information is not intended to replace advice given to you by your health care provider. Make sure you discuss any questions you have with your health care provider.

## 2014-09-04 ENCOUNTER — Emergency Department (HOSPITAL_COMMUNITY)
Admission: EM | Admit: 2014-09-04 | Discharge: 2014-09-04 | Disposition: A | Discharge status: Home / Self Care | Payer: Medicare Other | Attending: Emergency Medicine | Admitting: Emergency Medicine

## 2014-09-04 ENCOUNTER — Emergency Department (HOSPITAL_COMMUNITY): Payer: Medicare Other

## 2014-09-04 ENCOUNTER — Encounter (HOSPITAL_COMMUNITY): Payer: Self-pay | Admitting: Emergency Medicine

## 2014-09-04 DIAGNOSIS — J4489 Other specified chronic obstructive pulmonary disease: Secondary | ICD-10-CM | POA: Insufficient documentation

## 2014-09-04 DIAGNOSIS — I10 Essential (primary) hypertension: Secondary | ICD-10-CM | POA: Diagnosis not present

## 2014-09-04 DIAGNOSIS — J449 Chronic obstructive pulmonary disease, unspecified: Secondary | ICD-10-CM | POA: Insufficient documentation

## 2014-09-04 DIAGNOSIS — Z8739 Personal history of other diseases of the musculoskeletal system and connective tissue: Secondary | ICD-10-CM | POA: Diagnosis not present

## 2014-09-04 DIAGNOSIS — Z79899 Other long term (current) drug therapy: Secondary | ICD-10-CM | POA: Insufficient documentation

## 2014-09-04 DIAGNOSIS — Z87891 Personal history of nicotine dependence: Secondary | ICD-10-CM | POA: Diagnosis not present

## 2014-09-04 DIAGNOSIS — E785 Hyperlipidemia, unspecified: Secondary | ICD-10-CM | POA: Insufficient documentation

## 2014-09-04 DIAGNOSIS — R51 Headache: Secondary | ICD-10-CM | POA: Insufficient documentation

## 2014-09-04 DIAGNOSIS — Z8719 Personal history of other diseases of the digestive system: Secondary | ICD-10-CM | POA: Insufficient documentation

## 2014-09-04 DIAGNOSIS — G43809 Other migraine, not intractable, without status migrainosus: Secondary | ICD-10-CM | POA: Insufficient documentation

## 2014-09-04 DIAGNOSIS — Z880 Allergy status to penicillin: Secondary | ICD-10-CM | POA: Diagnosis not present

## 2014-09-04 DIAGNOSIS — G43109 Migraine with aura, not intractable, without status migrainosus: Secondary | ICD-10-CM

## 2014-09-04 LAB — CBC WITH DIFFERENTIAL/PLATELET
Basophils Absolute: 0.1 10*3/uL (ref 0.0–0.1)
Basophils Relative: 1 % (ref 0–1)
Eosinophils Absolute: 0.2 10*3/uL (ref 0.0–0.7)
Eosinophils Relative: 3 % (ref 0–5)
HCT: 39.4 % (ref 36.0–46.0)
Hemoglobin: 13.1 g/dL (ref 12.0–15.0)
Lymphocytes Relative: 26 % (ref 12–46)
Lymphs Abs: 2.2 10*3/uL (ref 0.7–4.0)
MCH: 28.4 pg (ref 26.0–34.0)
MCHC: 33.2 g/dL (ref 30.0–36.0)
MCV: 85.3 fL (ref 78.0–100.0)
Monocytes Absolute: 0.5 10*3/uL (ref 0.1–1.0)
Monocytes Relative: 6 % (ref 3–12)
Neutro Abs: 5.6 10*3/uL (ref 1.7–7.7)
Neutrophils Relative %: 64 % (ref 43–77)
Platelets: 251 10*3/uL (ref 150–400)
RBC: 4.62 MIL/uL (ref 3.87–5.11)
RDW: 13.4 % (ref 11.5–15.5)
WBC: 8.6 10*3/uL (ref 4.0–10.5)

## 2014-09-04 LAB — URINALYSIS, ROUTINE W REFLEX MICROSCOPIC
Bilirubin Urine: NEGATIVE
Glucose, UA: NEGATIVE mg/dL
Ketones, ur: NEGATIVE mg/dL
Leukocytes, UA: NEGATIVE
Nitrite: NEGATIVE
Protein, ur: NEGATIVE mg/dL
Specific Gravity, Urine: 1.013 (ref 1.005–1.030)
Urobilinogen, UA: 0.2 mg/dL (ref 0.0–1.0)
pH: 6.5 (ref 5.0–8.0)

## 2014-09-04 LAB — URINE MICROSCOPIC-ADD ON

## 2014-09-04 LAB — I-STAT CHEM 8, ED
BUN: 16 mg/dL (ref 6–23)
Calcium, Ion: 1.15 mmol/L (ref 1.13–1.30)
Chloride: 105 mEq/L (ref 96–112)
Creatinine, Ser: 0.7 mg/dL (ref 0.50–1.10)
Glucose, Bld: 126 mg/dL — ABNORMAL HIGH (ref 70–99)
HCT: 40 % (ref 36.0–46.0)
Hemoglobin: 13.6 g/dL (ref 12.0–15.0)
Potassium: 3.6 mEq/L — ABNORMAL LOW (ref 3.7–5.3)
Sodium: 139 mEq/L (ref 137–147)
TCO2: 24 mmol/L (ref 0–100)

## 2014-09-04 MED ORDER — KETOROLAC TROMETHAMINE 30 MG/ML IJ SOLN
30.0000 mg | Freq: Once | INTRAMUSCULAR | Status: AC
  Administered 2014-09-04: 30 mg via INTRAVENOUS
  Filled 2014-09-04: qty 1

## 2014-09-04 MED ORDER — DIPHENHYDRAMINE HCL 50 MG/ML IJ SOLN: 12.5000 mg | Freq: Once | INTRAMUSCULAR | Status: DC

## 2014-09-04 MED ORDER — DEXAMETHASONE SODIUM PHOSPHATE 10 MG/ML IJ SOLN
10.0000 mg | Freq: Once | INTRAMUSCULAR | Status: AC
  Administered 2014-09-04: 10 mg via INTRAVENOUS
  Filled 2014-09-04: qty 1

## 2014-09-04 MED ORDER — HYDROCHLOROTHIAZIDE 12.5 MG PO TABS: 12.5000 mg | ORAL_TABLET | Freq: Every day | ORAL | Status: DC

## 2014-09-04 MED ORDER — HYDROCHLOROTHIAZIDE 12.5 MG PO CAPS
12.5000 mg | ORAL_CAPSULE | Freq: Once | ORAL | Status: AC
  Administered 2014-09-04: 12.5 mg via ORAL
  Filled 2014-09-04: qty 1

## 2014-09-04 MED ORDER — METOCLOPRAMIDE HCL 5 MG/ML IJ SOLN
10.0000 mg | Freq: Once | INTRAMUSCULAR | Status: AC
  Administered 2014-09-04: 10 mg via INTRAVENOUS
  Filled 2014-09-04: qty 2

## 2014-09-04 NOTE — ED Notes (Signed)
Patient transported to CT 

## 2014-09-04 NOTE — ED Notes (Signed)
Bed: WTR5 Expected date:  Expected time:  Means of arrival:  Comments: 

## 2014-09-04 NOTE — ED Provider Notes (Signed)
CSN: [id number]     Arrival date & time 09/04/14  0053 History   First MD Initiated Contact with Patient 09/04/14 0142     Chief Complaint  Patient presents with  . Hypertension  . Headache     (Consider location/radiation/quality/duration/timing/severity/associated sxs/prior Treatment) Patient is a 68 y.o. female presenting with headaches and migraines. The history is provided by the patient.  Headache Associated symptoms: no abdominal pain   Migraine This is a recurrent problem. The current episode started more than 2 days ago. The problem occurs constantly. The problem has not changed since onset.Associated symptoms include headaches. Pertinent negatives include no chest pain, no abdominal pain and no shortness of breath. Nothing aggravates the symptoms. Nothing relieves the symptoms. Treatments tried: meds at urgent care. The treatment provided no relief.  Also notes her BP is up today.    Past Medical History  Diagnosis Date  . Hypertension   . Hyperlipemia   . GERD (gastroesophageal reflux disease)   . Rectal bleeding   . Blood transfusion ~ 1955    "before heart OR" (05/09/2013)  . COPD (chronic obstructive pulmonary disease)   . Weight loss   . Nasal congestion   . Wheezing   . Asthma   . Hemorrhoids   . Urinary frequency   . Hypertension   . Exertional shortness of breath   . History of stomach ulcers 1990's  . Migraines   . Arthritis     "knees" (05/09/2013)   Past Surgical History  Procedure Laterality Date  . Heart surgery at 63yr  1955    "blood was going thru my lungs but not thru my heart like it should" (05/09/2013)  . Appendectomy  1970's  . Colonoscopy    . Left oophorectomy Left ~ 1985  . Colonoscopy  12/22/2011    Procedure: COLONOSCOPY;  Surgeon: Lear Ng, MD;  Location: [facility];  Service: Endoscopy;  Laterality: N/A;  . Tumor excision Left ~ 1984    "under breast" (05/09/2013)  . Knee arthroscopy Left ~ 2004; ~ 2006  . Lipoma excision   02/15/2012    Procedure: EXCISION LIPOMA;  Surgeon: Joyice Faster. Cornett, MD;  Location: [facility];  Service: General;  Laterality: Left;  excision lipoma Left Lower abdomen  . 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 Tubal ligation Left ~ 1985  . 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 Colon cancer Father   . Cancer Father     colon  . Heart disease Father   . Malignant hyperthermia Neg Hx   . Heart disease Paternal Uncle   . Diabetes Sister    History  Substance Use Topics  . Smoking status: Former Smoker -- 2.00 packs/day for 35 years    Types: Cigarettes    Quit date: 08/20/2013  . Smokeless tobacco: Never Used     Comment: Currently exposed to 2nd hand smoke in home   . Alcohol Use: No   OB History   Grav Para Term Preterm Abortions TAB SAB Ect Mult Living                 Review of Systems  Respiratory: Negative for shortness of breath.   Cardiovascular: Negative for chest pain.  Gastrointestinal: Negative for abdominal pain.  Neurological: Positive for headaches.  All other systems reviewed and are negative.     Allergies  Sulfa antibiotics; Amoxicillin; Azithromycin; Penicillins; Propoxyphene n-acetaminophen; and Clarithromycin  Home Medications   Prior to Admission medications   Medication Sig Start Date End Date Taking? Authorizing Provider  albuterol (PROVENTIL HFA;VENTOLIN HFA) 108 (90 BASE) MCG/ACT inhaler Inhale 2 puffs into the lungs every 6 (six) hours as needed. Shortness of breath 09/11/12  Yes Gregor Hams, MD  CRESTOR 20 MG tablet Take 20 mg by mouth daily. 12/21/13  Yes Historical Provider, MD  ipratropium (ATROVENT) 0.03 % nasal spray Place 2 sprays into both nostrils every 12 (twelve) hours.   Yes Historical Provider, MD  losartan (COZAAR) 50 MG tablet Take  1 tablet (50 mg total) by mouth daily. 04/17/13  Yes Collene Gobble, MD  promethazine (PHENERGAN) 25 MG tablet Take 1 tablet (25 mg total) by mouth every 6 (six) hours as needed for nausea. 06/17/13  Yes Elwyn Lade, PA-C  Tiotropium Bromide-Olodaterol (STIOLTO RESPIMAT) 2.5-2.5 MCG/ACT AERS Inhale 2 puffs into the lungs daily.   Yes Historical Provider, MD  topiramate (TOPAMAX) 50 MG tablet Take 50 mg by mouth 2 (two) times daily as needed. For headache pain   Yes Historical Provider, MD   BP 175/88  Pulse 83  Temp(Src) 97.8 F (36.6 C) (Oral)  Resp 20  SpO2 99% Physical Exam  Constitutional: She is oriented to person, place, and time. She appears well-developed and well-nourished. No distress.  HENT:  Head: Normocephalic.  Mouth/Throat: Oropharynx is clear and moist.  Eyes: Conjunctivae are normal. Pupils are equal, round, and reactive to light.  Neck: Normal range of motion. Neck supple.  Cardiovascular: Normal rate, regular rhythm and intact distal pulses.   Pulmonary/Chest: Effort normal and breath sounds normal. She has no wheezes. She has no rales.  Abdominal: Soft. Bowel sounds are normal. There is no tenderness. There is no rebound and no guarding.  Musculoskeletal: Normal range of motion. She exhibits no edema and no tenderness.  Lymphadenopathy:    She has no cervical adenopathy.  Neurological: She is alert and oriented to person, place, and time. She has normal strength and normal reflexes. She displays normal reflexes. No cranial nerve deficit. She displays a negative Romberg sign. Coordination normal. GCS eye subscore is 4. GCS verbal subscore is 5. GCS motor subscore is 6.  Skin: Skin is warm and dry.  Psychiatric: She has a normal mood and affect.    ED Course  Procedures (including critical care time) Labs Review Labs Reviewed  URINALYSIS, ROUTINE W REFLEX MICROSCOPIC - Abnormal; Notable for the following:    Hgb urine dipstick MODERATE (*)    All other  components within normal limits  I-STAT CHEM 8, ED - Abnormal; Notable for the following:    Potassium 3.6 (*)    Glucose, Bld 126 (*)    All other components within normal limits  CBC WITH DIFFERENTIAL  URINE MICROSCOPIC-ADD ON    Imaging Review No results found.   EKG Interpretation None      MDM   Final diagnoses:  None   Patient is well appearing sitting in the room with all the lights on.  She drove her self to the urgent care and then the ED  The headache preceded the HTN suspect HTN is related to pain and pain is markedly improved.  Will need to follow up with her PMD regarding suppressive medications for migraines and HTN.  Will prescribe HCTZ until she can be seen.  There are no neuro symptoms and there are no fevers rashes nor neck stiffness no travel or tick exposure.  No indication for LP at this time    Keven Osborn Alfonso Patten, MD 09/04/14 3007

## 2014-09-04 NOTE — ED Notes (Signed)
Pt reports elevated BP all day yesterday with a h/a and lightheadedness x 2 days.  Pt reports she's been under a lot of stress lately.  Was seen at [facility] last night and was given "2 shots" without relief.

## 2014-09-05 ENCOUNTER — Ambulatory Visit (HOSPITAL_COMMUNITY)
Admission: RE | Admit: 2014-09-05 | Discharge: 2014-09-05 | Disposition: A | Discharge status: Home / Self Care | Payer: PRIVATE HEALTH INSURANCE | Source: Ambulatory Visit | Attending: Family Medicine | Admitting: Family Medicine

## 2014-09-05 DIAGNOSIS — Z1231 Encounter for screening mammogram for malignant neoplasm of breast: Secondary | ICD-10-CM

## 2014-10-04 ENCOUNTER — Emergency Department (HOSPITAL_COMMUNITY)
Admission: EM | Admit: 2014-10-04 | Discharge: 2014-10-04 | Disposition: A | Discharge status: Home / Self Care | Payer: Medicare Other | Attending: Emergency Medicine | Admitting: Emergency Medicine

## 2014-10-04 ENCOUNTER — Emergency Department (HOSPITAL_COMMUNITY): Payer: Medicare Other

## 2014-10-04 ENCOUNTER — Encounter (HOSPITAL_COMMUNITY): Payer: Self-pay | Admitting: Emergency Medicine

## 2014-10-04 DIAGNOSIS — M170 Bilateral primary osteoarthritis of knee: Secondary | ICD-10-CM | POA: Diagnosis not present

## 2014-10-04 DIAGNOSIS — I10 Essential (primary) hypertension: Secondary | ICD-10-CM | POA: Diagnosis not present

## 2014-10-04 DIAGNOSIS — Z8719 Personal history of other diseases of the digestive system: Secondary | ICD-10-CM | POA: Insufficient documentation

## 2014-10-04 DIAGNOSIS — J441 Chronic obstructive pulmonary disease with (acute) exacerbation: Secondary | ICD-10-CM | POA: Insufficient documentation

## 2014-10-04 DIAGNOSIS — E785 Hyperlipidemia, unspecified: Secondary | ICD-10-CM | POA: Diagnosis not present

## 2014-10-04 DIAGNOSIS — R05 Cough: Secondary | ICD-10-CM | POA: Diagnosis present

## 2014-10-04 DIAGNOSIS — Z79899 Other long term (current) drug therapy: Secondary | ICD-10-CM | POA: Diagnosis not present

## 2014-10-04 DIAGNOSIS — J40 Bronchitis, not specified as acute or chronic: Secondary | ICD-10-CM

## 2014-10-04 DIAGNOSIS — Z87891 Personal history of nicotine dependence: Secondary | ICD-10-CM | POA: Diagnosis not present

## 2014-10-04 DIAGNOSIS — G43909 Migraine, unspecified, not intractable, without status migrainosus: Secondary | ICD-10-CM | POA: Diagnosis not present

## 2014-10-04 DIAGNOSIS — Z880 Allergy status to penicillin: Secondary | ICD-10-CM | POA: Diagnosis not present

## 2014-10-04 LAB — CBC
HCT: 41.1 % (ref 36.0–46.0)
Hemoglobin: 13.8 g/dL (ref 12.0–15.0)
MCH: 29.1 pg (ref 26.0–34.0)
MCHC: 33.6 g/dL (ref 30.0–36.0)
MCV: 86.7 fL (ref 78.0–100.0)
Platelets: 216 10*3/uL (ref 150–400)
RBC: 4.74 MIL/uL (ref 3.87–5.11)
RDW: 13.6 % (ref 11.5–15.5)
WBC: 9.3 10*3/uL (ref 4.0–10.5)

## 2014-10-04 LAB — COMPREHENSIVE METABOLIC PANEL
ALT: 12 U/L (ref 0–35)
AST: 16 U/L (ref 0–37)
Albumin: 4 g/dL (ref 3.5–5.2)
Alkaline Phosphatase: 69 U/L (ref 39–117)
Anion gap: 13 (ref 5–15)
BUN: 22 mg/dL (ref 6–23)
CO2: 25 mEq/L (ref 19–32)
Calcium: 9.2 mg/dL (ref 8.4–10.5)
Chloride: 101 mEq/L (ref 96–112)
Creatinine, Ser: 0.8 mg/dL (ref 0.50–1.10)
GFR calc Af Amer: 87 mL/min — ABNORMAL LOW (ref >90)
GFR calc non Af Amer: 75 mL/min — ABNORMAL LOW (ref >90)
Glucose, Bld: 113 mg/dL — ABNORMAL HIGH (ref 70–99)
Potassium: 4.1 mEq/L (ref 3.7–5.3)
Sodium: 139 mEq/L (ref 137–147)
Total Bilirubin: 0.3 mg/dL (ref 0.3–1.2)
Total Protein: 7.2 g/dL (ref 6.0–8.3)

## 2014-10-04 LAB — I-STAT TROPONIN, ED: Troponin i, poc: 0 ng/mL (ref 0.00–0.08)

## 2014-10-04 LAB — D-DIMER, QUANTITATIVE (NOT AT ARMC): D-Dimer, Quant: 0.61 ug/mL-FEU — ABNORMAL HIGH (ref 0.00–0.48)

## 2014-10-04 MED ORDER — IPRATROPIUM-ALBUTEROL 0.5-2.5 (3) MG/3ML IN SOLN
3.0000 mL | Freq: Once | RESPIRATORY_TRACT | Status: AC
Start: 2014-10-04 — End: 2014-10-04
  Administered 2014-10-04: 3 mL via RESPIRATORY_TRACT
  Filled 2014-10-04: qty 3

## 2014-10-04 MED ORDER — IOHEXOL 350 MG/ML SOLN
100.0000 mL | Freq: Once | INTRAVENOUS | Status: AC | PRN
  Administered 2014-10-04: 100 mL via INTRAVENOUS

## 2014-10-04 MED ORDER — PREDNISONE 20 MG PO TABS: 40.0000 mg | ORAL_TABLET | Freq: Every day | ORAL | Status: DC

## 2014-10-04 MED ORDER — METHYLPREDNISOLONE SODIUM SUCC 125 MG IJ SOLR
125.0000 mg | Freq: Once | INTRAMUSCULAR | Status: AC
  Administered 2014-10-04: 125 mg via INTRAVENOUS
  Filled 2014-10-04: qty 2

## 2014-10-04 MED ORDER — ALBUTEROL SULFATE (2.5 MG/3ML) 0.083% IN NEBU: 2.5000 mg | INHALATION_SOLUTION | Freq: Four times a day (QID) | RESPIRATORY_TRACT | Status: AC | PRN

## 2014-10-04 MED ORDER — ALBUTEROL SULFATE HFA 108 (90 BASE) MCG/ACT IN AERS: 1.0000 | INHALATION_SPRAY | Freq: Four times a day (QID) | RESPIRATORY_TRACT | Status: DC | PRN

## 2014-10-04 MED ORDER — IPRATROPIUM-ALBUTEROL 0.5-2.5 (3) MG/3ML IN SOLN
3.0000 mL | Freq: Once | RESPIRATORY_TRACT | Status: AC
  Administered 2014-10-04: 3 mL via RESPIRATORY_TRACT
  Filled 2014-10-04: qty 3

## 2014-10-04 NOTE — ED Provider Notes (Addendum)
Medical screening examination/treatment/procedure(s) were conducted as a shared visit with non-physician practitioner(s) and myself.  I personally evaluated the patient during the encounter.   Date: 10/04/2014  Rate: 80  Rhythm: normal sinus rhythm  QRS Axis: normal  Intervals: normal  ST/T Wave abnormalities: low voltage precordial elads  Conduction Disutrbances:none  Narrative Interpretation:   Old EKG Reviewed: unchanged     EKG Interpretation None      Pt presents with 1-2 days of cough, congestion, rhinorrhea, chills. Denies fever, CP. She has had some SOB/wheezing. She is a long-term smoker who quit 1 year ago. On PE, VSS, afebrile, in NAD. She has scattered wheezing throughout which improved with duoneb. D-dimer was ordered from triage and was elevated, but CTA negative for PE. Will d/ chome w/ albuterol & prednisone for likely bronchitis w/ COPD exacerbation.    Ernestina Patches, MD 10/04/14 [id number]  Ernestina Patches, MD 10/04/14 [phone]

## 2014-10-04 NOTE — ED Notes (Signed)
Patient transported to CT 

## 2014-10-04 NOTE — ED Notes (Signed)
Respiratory called, will come to give treatment.

## 2014-10-04 NOTE — ED Notes (Signed)
Pt awake. Verbally responsive. Resp even and unlabored. Auscultated clear breath sounds bil and throughout. ABC's intact. Pt ambulated to bathroom with steady gait.

## 2014-10-04 NOTE — ED Notes (Signed)
Pt has nonproductive wet cough. Auscultated wheezing to LLL.

## 2014-10-04 NOTE — ED Notes (Signed)
Patient transported to X-ray 

## 2014-10-04 NOTE — ED Notes (Signed)
Per pt, states cough and congestion and body aches since yesterday

## 2014-10-04 NOTE — ED Provider Notes (Signed)
CSN: [id number]     Arrival date & time 10/04/14  1733 History   First MD Initiated Contact with Patient 10/04/14 [place]     Chief Complaint  Patient presents with  . Nasal Congestion  . Cough     (Consider location/radiation/quality/duration/timing/severity/associated sxs/prior Treatment) HPI Comments: Patient is a 68 year old female past medical history significant for hypertension, hyperlipidemia, COPD, asthma presenting to the emergency department for 2 days of nasal congestion, nonproductive cough, wheezing and chest tightness. Patient states he has attempted using her home inhaler without improvement. She has not tried any other over-the-counter cough or cold medications. No known sick contacts. No recent admissions for COPD or asthma exacerbations. No history of PE/DVT.    Past Medical History  Diagnosis Date  . Hypertension   . Hyperlipemia   . GERD (gastroesophageal reflux disease)   . Rectal bleeding   . Blood transfusion ~ 1955    "before heart OR" (05/09/2013)  . COPD (chronic obstructive pulmonary disease)   . Weight loss   . Nasal congestion   . Wheezing   . Asthma   . Hemorrhoids   . Urinary frequency   . Hypertension   . Exertional shortness of breath   . History of stomach ulcers 1990's  . Migraines   . Arthritis     "knees" (05/09/2013)   Past Surgical History  Procedure Laterality Date  . Heart surgery at 60yr  1955    "blood was going thru my lungs but not thru my heart like it should" (05/09/2013)  . Appendectomy  1970's  . Colonoscopy    . Left oophorectomy Left ~ 1985  . Colonoscopy  12/22/2011    Procedure: COLONOSCOPY;  Surgeon: Lear Ng, MD;  Location: [facility];  Service: Endoscopy;  Laterality: N/A;  . Tumor excision Left ~ 1984    "under breast" (05/09/2013)  . Knee arthroscopy Left ~ 2004; ~ 2006  . Lipoma excision  02/15/2012    Procedure: EXCISION LIPOMA;  Surgeon: Joyice Faster. Cornett, MD;  Location: [facility];   Service: General;  Laterality: Left;  excision lipoma Left Lower abdomen  . 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 Tubal ligation Left ~ 1985  . 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 Colon cancer Father   . Cancer Father     colon  . Heart disease Father   . Malignant hyperthermia Neg Hx   . Heart disease Paternal Uncle   . Diabetes Sister    History  Substance Use Topics  . Smoking status: Former Smoker -- 2.00 packs/day for 35 years    Types: Cigarettes    Quit date: 08/20/2013  . Smokeless tobacco: Never Used     Comment: Currently exposed to 2nd hand smoke in home   . Alcohol Use: No   OB History   Grav Para Term Preterm Abortions TAB SAB Ect Mult Living                 Review of Systems  Constitutional: Positive for chills. Negative for fever.  Respiratory: Positive for cough, chest tightness and wheezing. Negative for shortness of breath.   Cardiovascular: Negative for chest pain and leg swelling.  All other systems reviewed and are negative.     Allergies  Sulfa antibiotics; Amoxicillin; Azithromycin; Penicillins;  Propoxyphene n-acetaminophen; and Clarithromycin  Home Medications   Prior to Admission medications   Medication Sig Start Date End Date Taking? Authorizing Provider  albuterol (PROVENTIL HFA;VENTOLIN HFA) 108 (90 BASE) MCG/ACT inhaler Inhale 2 puffs into the lungs every 6 (six) hours as needed for wheezing (wheezing). Shortness of breath 09/11/12  Yes Gregor Hams, MD  CRESTOR 20 MG tablet Take 20 mg by mouth daily. 12/21/13  Yes Historical Provider, MD  hydrochlorothiazide (HYDRODIURIL) 12.5 MG tablet Take 1 tablet (12.5 mg total) by mouth daily. 09/04/14  Yes April K Palumbo-Rasch, MD  ipratropium (ATROVENT) 0.03 % nasal spray Place 2 sprays into both nostrils every 12  (twelve) hours.   Yes Historical Provider, MD  losartan (COZAAR) 50 MG tablet Take 1 tablet (50 mg total) by mouth daily. 04/17/13  Yes Collene Gobble, MD  Tiotropium Bromide-Olodaterol (STIOLTO RESPIMAT) 2.5-2.5 MCG/ACT AERS Inhale 2 puffs into the lungs daily.   Yes Historical Provider, MD  topiramate (TOPAMAX) 50 MG tablet Take 50 mg by mouth 2 (two) times daily as needed (headache & migraine pain). For headache pain   Yes Historical Provider, MD  albuterol (PROVENTIL HFA;VENTOLIN HFA) 108 (90 BASE) MCG/ACT inhaler Inhale 1-2 puffs into the lungs every 6 (six) hours as needed for wheezing or shortness of breath. 10/04/14   Babatunde Seago L Miles Leyda, PA-C  albuterol (PROVENTIL) (2.5 MG/3ML) 0.083% nebulizer solution Take 3 mLs (2.5 mg total) by nebulization every 6 (six) hours as needed for wheezing or shortness of breath. 10/04/14   Kitti Mcclish L Tayvin Preslar, PA-C  predniSONE (DELTASONE) 20 MG tablet Take 2 tablets (40 mg total) by mouth daily. 10/04/14   Bren Borys L Mikhi Athey, PA-C   BP 123/76  Pulse 89  Temp(Src) 98.3 F (36.8 C) (Oral)  Resp 21  SpO2 100% Physical Exam  Nursing note and vitals reviewed. Constitutional: She is oriented to person, place, and time. She appears well-developed and well-nourished. No distress.  HENT:  Head: Normocephalic and atraumatic.  Right Ear: External ear normal.  Left Ear: External ear normal.  Mouth/Throat: Oropharynx is clear and moist. No oropharyngeal exudate.  Eyes: Conjunctivae are normal.  Neck: Neck supple.  Cardiovascular: Normal rate, regular rhythm and normal heart sounds.   Pulmonary/Chest: Effort normal. No respiratory distress. She has wheezes (diffuse inspiratory and expiratory). She has no rales. She exhibits no tenderness.  Abdominal: Soft. Bowel sounds are normal. There is no tenderness.  Musculoskeletal: Normal range of motion. She exhibits no edema.  Neurological: She is alert and oriented to person, place, and time.  Skin: Skin is  warm and dry. She is not diaphoretic.    ED Course  Procedures (including critical care time) Medications  methylPREDNISolone sodium succinate (SOLU-MEDROL) 125 mg/2 mL injection 125 mg (125 mg Intravenous Given 10/04/14 1939)  ipratropium-albuterol (DUONEB) 0.5-2.5 (3) MG/3ML nebulizer solution 3 mL (3 mLs Nebulization Given 10/04/14 1940)  iohexol (OMNIPAQUE) 350 MG/ML injection 100 mL (100 mLs Intravenous Contrast Given 10/04/14 2042)  ipratropium-albuterol (DUONEB) 0.5-2.5 (3) MG/3ML nebulizer solution 3 mL (3 mLs Nebulization Given 10/04/14 2144)    Labs Review Labs Reviewed  COMPREHENSIVE METABOLIC PANEL - Abnormal; Notable for the following:    Glucose, Bld 113 (*)    GFR calc non Af Amer 75 (*)    GFR calc Af Amer 87 (*)    All other components within normal limits  D-DIMER, QUANTITATIVE - Abnormal; Notable for the following:    D-Dimer, Quant 0.61 (*)    All other components within normal limits  CBC  I-STAT TROPOININ, ED    Imaging Review Dg Chest 2 View  10/04/2014   CLINICAL DATA:  Cough, congestion body aches since 10/03/2014.  EXAM: CHEST  2 VIEW  COMPARISON:  PA and lateral chest 02/09/2014 and 01/04/2013.  FINDINGS: The lungs are clear. Heart size is normal. There is no pneumothorax or pleural effusion. Postoperative change left shoulder is noted.  IMPRESSION: No acute disease.   Electronically Signed   By: Inge Rise M.D.   On: 10/04/2014 18:49   Ct Angio Chest W/cm &/or Wo Cm  10/04/2014   CLINICAL DATA:  Cough, chest congestion and myalgias. Elevated D-dimer.  EXAM: CT ANGIOGRAPHY CHEST WITH CONTRAST  TECHNIQUE: Multidetector CT imaging of the chest was performed using the standard protocol during bolus administration of intravenous contrast. Multiplanar CT image reconstructions and MIPs were obtained to evaluate the vascular anatomy.  CONTRAST:  158mL OMNIPAQUE IOHEXOL 350 MG/ML SOLN  COMPARISON:  Chest radiographs obtained earlier today. Chest CTA dated  03/06/2007.  FINDINGS: There are small left lower lobe bronchial mucus plugs. The pulmonary arteries are normally opacified with no pulmonary arterial filling defects seen. There are Mild bullous changes bilaterally and mildly prominent interstitial markings with mild diffuse peribronchial thickening. No areas of bronchiectasis are seen. There is a small amount of linear atelectasis or scarring in the left lower lobe. No lung nodules or enlarged lymph nodes. The upper abdomen is unremarkable. Thoracic spine degenerative changes and mild scoliosis.  Review of the MIP images confirms the above findings.  IMPRESSION: 1. No pulmonary emboli. 2. Mild bronchitic changes and mild changes of COPD with small left lower lobe mucus plugs.   Electronically Signed   By: Enrique Sack M.D.   On: 10/04/2014 21:22     EKG Interpretation None      9:26 PM Patient wheezing improved, but still with left sided inspiratory and expiratory wheezing.   MDM   Final diagnoses:  Bronchitis    Filed Vitals:   10/04/14 2232  BP:   Pulse:   Temp: 98.3 F (36.8 C)  Resp:    Afebrile, NAD, non-toxic appearing, AAOx4.   I have reviewed nursing notes, vital signs, and all appropriate lab and imaging results for this patient. Wheezing improved after 2 DuoNeb's and IV Solu-Medrol administration. Patient endorses significant improvement in her breathing and wishes to be discharged. Symptoms consistent with bronchitis with asthma exacerbation. Will refill inhaler and nebulizer. At home measures discussed with patient. Return precautions discussed. Patient is agreeable to plan. Patient is stable at time of discharge. Patient d/w with Dr. Tawnya Crook, agrees with plan.          Harlow Mares, PA-C 10/04/14 2309

## 2014-10-04 NOTE — Discharge Instructions (Signed)
Please follow up with your primary care physician in 1-2 days. If you do not have one please call the [place] number listed above. Please use your inhaler two puffs every four to six hours for cough, wheezing, and shortness of breath. Please read all discharge instructions and return precautions.   Acute Bronchitis Bronchitis is inflammation of the airways that extend from the windpipe into the lungs (bronchi). The inflammation often causes mucus to develop. This leads to a cough, which is the most common symptom of bronchitis.  In acute bronchitis, the condition usually develops suddenly and goes away over time, usually in a couple weeks. Smoking, allergies, and asthma can make bronchitis worse. Repeated episodes of bronchitis may cause further lung problems.  CAUSES Acute bronchitis is most often caused by the same virus that causes a cold. The virus can spread from person to person (contagious) through coughing, sneezing, and touching contaminated objects. SIGNS AND SYMPTOMS   Cough.   Fever.   Coughing up mucus.   Body aches.   Chest congestion.   Chills.   Shortness of breath.   Sore throat.  DIAGNOSIS  Acute bronchitis is usually diagnosed through a physical exam. Your health care provider will also ask you questions about your medical history. Tests, such as chest X-rays, are sometimes done to rule out other conditions.  TREATMENT  Acute bronchitis usually goes away in a couple weeks. Oftentimes, no medical treatment is necessary. Medicines are sometimes given for relief of fever or cough. Antibiotic medicines are usually not needed but may be prescribed in certain situations. In some cases, an inhaler may be recommended to help reduce shortness of breath and control the cough. A cool mist vaporizer may also be used to help thin bronchial secretions and make it easier to clear the chest.  HOME CARE INSTRUCTIONS  Get plenty of rest.   Drink  enough fluids to keep your urine clear or pale yellow (unless you have a medical condition that requires fluid restriction). Increasing fluids may help thin your respiratory secretions (sputum) and reduce chest congestion, and it will prevent dehydration.   Take medicines only as directed by your health care provider.  If you were prescribed an antibiotic medicine, finish it all even if you start to feel better.  Avoid smoking and secondhand smoke. Exposure to cigarette smoke or irritating chemicals will make bronchitis worse. If you are a smoker, consider using nicotine gum or skin patches to help control withdrawal symptoms. Quitting smoking will help your lungs heal faster.   Reduce the chances of another bout of acute bronchitis by washing your hands frequently, avoiding people with cold symptoms, and trying not to touch your hands to your mouth, nose, or eyes.   Keep all follow-up visits as directed by your health care provider.  SEEK MEDICAL CARE IF: Your symptoms do not improve after 1 week of treatment.  SEEK IMMEDIATE MEDICAL CARE IF:  You develop an increased fever or chills.   You have chest pain.   You have severe shortness of breath.  You have bloody sputum.   You develop dehydration.  You faint or repeatedly feel like you are going to pass out.  You develop repeated vomiting.  You develop a severe headache. MAKE SURE YOU:   Understand these instructions.  Will watch your condition.  Will get help right away if you are not doing well or get worse. Document Released: 01/12/2005 Document Revised: 04/21/2014 Document Reviewed: 05/28/2013 Exit[employer] Patient Information  2015 [employer]. This information is not intended to replace advice given to you by your health care provider. Make sure you discuss any questions you have with your health care provider.

## 2014-10-04 NOTE — ED Notes (Signed)
Patient returned from CT scan and ambulated to bathroom.

## 2014-10-06 ENCOUNTER — Telehealth: Payer: Self-pay | Admitting: Emergency Medicine

## 2014-10-06 NOTE — Telephone Encounter (Signed)
Please advise Dr Lamonte Sakai. Pt is requesting a permanent parking placard. Currently has a temporary placard. Pt dropped off form. Form is in your look at folder.

## 2014-10-10 NOTE — Telephone Encounter (Signed)
RB, handicapped form/placard is on your computer in your office. Please advise. Thanks .

## 2014-10-13 NOTE — Telephone Encounter (Signed)
Pt would like a status update & can be reached at [phone].  Satira Anis

## 2014-10-13 NOTE — Telephone Encounter (Signed)
Spoke with pt in lobby-Explained to the patient that Dr Lamonte Sakai is not in office to fill out and will be addressed when back in office 10/27. Will send to Dr Lamonte Sakai as high priority as the patient is wanting this done ASAP.

## 2014-10-13 NOTE — Telephone Encounter (Signed)
PT IN LOBBY.  Pt states she missed a returned phone call b/c she was on the line w/ another call.  Satira Anis

## 2014-10-13 NOTE — Telephone Encounter (Signed)
lmtcb x1 for pt. 

## 2014-10-14 NOTE — Telephone Encounter (Signed)
Completed, signed at front to pick up.  Patient notified via voicemail that form is ready to pick up.  Nothing further required.

## 2014-10-20 ENCOUNTER — Ambulatory Visit: Payer: Medicare Other | Admitting: Gastroenterology

## 2014-12-09 ENCOUNTER — Encounter (HOSPITAL_COMMUNITY): Payer: Self-pay | Admitting: *Deleted

## 2014-12-09 ENCOUNTER — Emergency Department (INDEPENDENT_AMBULATORY_CARE_PROVIDER_SITE_OTHER)
Admission: EM | Admit: 2014-12-09 | Discharge: 2014-12-09 | Disposition: A | Discharge status: Home / Self Care | Payer: Medicare Other | Source: Home / Self Care | Attending: Family Medicine | Admitting: Family Medicine

## 2014-12-09 DIAGNOSIS — J069 Acute upper respiratory infection, unspecified: Secondary | ICD-10-CM

## 2014-12-09 MED ORDER — DOXYCYCLINE HYCLATE 100 MG PO CAPS: 100.0000 mg | ORAL_CAPSULE | Freq: Two times a day (BID) | ORAL | Status: DC

## 2014-12-09 MED ORDER — PREDNISONE 50 MG PO TABS: 50.0000 mg | ORAL_TABLET | Freq: Every day | ORAL | Status: DC

## 2014-12-09 MED ORDER — ALBUTEROL SULFATE HFA 108 (90 BASE) MCG/ACT IN AERS: 2.0000 | INHALATION_SPRAY | RESPIRATORY_TRACT | Status: DC | PRN

## 2014-12-09 NOTE — ED Notes (Signed)
Pt  Reports  Symptoms  Of  Cough  /  Congestion       With  Scratchy  Throat        Greenish  Sputum  Production        pt  Reports  The  Symptoms  X  4  Days        Pt  Also  Reports  Drainage  From  Both  Eyes   The  Eyes  She  Reported  Have  Been matted

## 2014-12-09 NOTE — Discharge Instructions (Signed)
Upper Respiratory Infection, Adult An upper respiratory infection (URI) is also sometimes known as the common cold. The upper respiratory tract includes the nose, sinuses, throat, trachea, and bronchi. Bronchi are the airways leading to the lungs. Most people improve within 1 week, but symptoms can last up to 2 weeks. A residual cough may last even longer.  CAUSES Many different viruses can infect the tissues lining the upper respiratory tract. The tissues become irritated and inflamed and often become very moist. Mucus production is also common. A cold is contagious. You can easily spread the virus to others by oral contact. This includes kissing, sharing a glass, coughing, or sneezing. Touching your mouth or nose and then touching a surface, which is then touched by another person, can also spread the virus. SYMPTOMS  Symptoms typically develop 1 to 3 days after you come in contact with a cold virus. Symptoms vary from person to person. They may include:  Runny nose.  Sneezing.  Nasal congestion.  Sinus irritation.  Sore throat.  Loss of voice (laryngitis).  Cough.  Fatigue.  Muscle aches.  Loss of appetite.  Headache.  Low-grade fever. DIAGNOSIS  You might diagnose your own cold based on familiar symptoms, since most people get a cold 2 to 3 times a year. Your caregiver can confirm this based on your exam. Most importantly, your caregiver can check that your symptoms are not due to another disease such as strep throat, sinusitis, pneumonia, asthma, or epiglottitis. Blood tests, throat tests, and X-rays are not necessary to diagnose a common cold, but they may sometimes be helpful in excluding other more serious diseases. Your caregiver will decide if any further tests are required. RISKS AND COMPLICATIONS  You may be at risk for a more severe case of the common cold if you smoke cigarettes, have chronic heart disease (such as heart failure) or lung disease (such as asthma), or if  you have a weakened immune system. The very young and very old are also at risk for more serious infections. Bacterial sinusitis, middle ear infections, and bacterial pneumonia can complicate the common cold. The common cold can worsen asthma and chronic obstructive pulmonary disease (COPD). Sometimes, these complications can require emergency medical care and may be life-threatening. PREVENTION  The best way to protect against getting a cold is to practice good hygiene. Avoid oral or hand contact with people with cold symptoms. Wash your hands often if contact occurs. There is no clear evidence that vitamin C, vitamin E, echinacea, or exercise reduces the chance of developing a cold. However, it is always recommended to get plenty of rest and practice good nutrition. TREATMENT  Treatment is directed at relieving symptoms. There is no cure. Antibiotics are not effective, because the infection is caused by a virus, not by bacteria. Treatment may include:  Increased fluid intake. Sports drinks offer valuable electrolytes, sugars, and fluids.  Breathing heated mist or steam (vaporizer or shower).  Eating chicken soup or other clear broths, and maintaining good nutrition.  Getting plenty of rest.  Using gargles or lozenges for comfort.  Controlling fevers with ibuprofen or acetaminophen as directed by your caregiver.  Increasing usage of your inhaler if you have asthma. Zinc gel and zinc lozenges, taken in the first 24 hours of the common cold, can shorten the duration and lessen the severity of symptoms. Pain medicines may help with fever, muscle aches, and throat pain. A variety of non-prescription medicines are available to treat congestion and runny nose. Your caregiver   can make recommendations and may suggest nasal or lung inhalers for other symptoms.  HOME CARE INSTRUCTIONS   Only take over-the-counter or prescription medicines for pain, discomfort, or fever as directed by your  caregiver.  Use a warm mist humidifier or inhale steam from a shower to increase air moisture. This may keep secretions moist and make it easier to breathe.  Drink enough water and fluids to keep your urine clear or pale yellow.  Rest as needed.  Return to work when your temperature has returned to normal or as your caregiver advises. You may need to stay home longer to avoid infecting others. You can also use a face mask and careful hand washing to prevent spread of the virus. SEEK MEDICAL CARE IF:   After the first few days, you feel you are getting worse rather than better.  You need your caregiver's advice about medicines to control symptoms.  You develop chills, worsening shortness of breath, or brown or red sputum. These may be signs of pneumonia.  You develop yellow or brown nasal discharge or pain in the face, especially when you bend forward. These may be signs of sinusitis.  You develop a fever, swollen neck glands, pain with swallowing, or white areas in the back of your throat. These may be signs of strep throat. SEEK IMMEDIATE MEDICAL CARE IF:   You have a fever.  You develop severe or persistent headache, ear pain, sinus pain, or chest pain.  You develop wheezing, a prolonged cough, cough up blood, or have a change in your usual mucus (if you have chronic lung disease).  You develop sore muscles or a stiff neck. Document Released: 05/31/2001 Document Revised: 02/27/2012 Document Reviewed: 03/12/2014 Exit[employer] Patient Information 2015 [employer]. This information is not intended to replace advice given to you by your health care provider. Make sure you discuss any questions you have with your health care provider.  

## 2014-12-09 NOTE — ED Notes (Signed)
Pt. asked about her eyes.  Thedore Mins said he thinks its related to her sinus infection.  She can use warm compresses in the morning to clear the matting.

## 2014-12-09 NOTE — ED Provider Notes (Signed)
CSN: [id number]     Arrival date & time 12/09/14  1630 History   First MD Initiated Contact with Patient 12/09/14 1659     Chief Complaint  Patient presents with  . Cough   (Consider location/radiation/quality/duration/timing/severity/associated sxs/prior Treatment) HPI          68 year old female presents for evaluation of cough, congestion, sinus pressure, sore throat, and draining from her eyes. This is been present for 4 days. She saw her primary care doctor 2 days ago who gave her some kind of pill to take, she has no idea what pill was and she didn't bring it with her, she just knows it didn't work. She does not know what type of medicine that was. She feels like she is getting worse. She has a long smoking history but no documented history of COPD. No chest pain or shortness of breath. No recent travel or sick contacts.  Past Medical History  Diagnosis Date  . Hypertension   . Hyperlipemia   . GERD (gastroesophageal reflux disease)   . Rectal bleeding   . Blood transfusion ~ 1955    "before heart OR" (05/09/2013)  . COPD (chronic obstructive pulmonary disease)   . Weight loss   . Nasal congestion   . Wheezing   . Asthma   . Hemorrhoids   . Urinary frequency   . Hypertension   . Exertional shortness of breath   . History of stomach ulcers 1990's  . Migraines   . Arthritis     "knees" (05/09/2013)   Past Surgical History  Procedure Laterality Date  . Heart surgery at 57yr  1955    "blood was going thru my lungs but not thru my heart like it should" (05/09/2013)  . Appendectomy  1970's  . Colonoscopy    . Left oophorectomy Left ~ 1985  . Colonoscopy  12/22/2011    Procedure: COLONOSCOPY;  Surgeon: Lear Ng, MD;  Location: [facility];  Service: Endoscopy;  Laterality: N/A;  . Tumor excision Left ~ 1984    "under breast" (05/09/2013)  . Knee arthroscopy Left ~ 2004; ~ 2006  . Lipoma excision  02/15/2012    Procedure: EXCISION LIPOMA;  Surgeon: Joyice Faster. Cornett,  MD;  Location: [facility];  Service: General;  Laterality: Left;  excision lipoma Left Lower abdomen  . 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 Tubal ligation Left ~ 1985  . 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 Colon cancer Father   . Cancer Father     colon  . Heart disease Father   . Malignant hyperthermia Neg Hx   . Heart disease Paternal Uncle   . Diabetes Sister    History  Substance Use Topics  . Smoking status: Former Smoker -- 2.00 packs/day for 35 years    Types: Cigarettes    Quit date: 08/20/2013  . Smokeless tobacco: Never Used     Comment: Currently exposed to 2nd hand smoke in home   . Alcohol Use: No   OB History    No data available     Review of Systems  Constitutional: Negative for fever and chills.  HENT: Positive for congestion, rhinorrhea and sore throat. Negative for ear pain.   Respiratory: Positive for cough. Negative for shortness of breath.   Cardiovascular:  Negative for chest pain.  Gastrointestinal: Negative for nausea, vomiting, abdominal pain and diarrhea.  All other systems reviewed and are negative.   Allergies  Sulfa antibiotics; Amoxicillin; Azithromycin; Penicillins; Propoxyphene n-acetaminophen; and Clarithromycin  Home Medications   Prior to Admission medications   Medication Sig Start Date End Date Taking? Authorizing Provider  albuterol (PROVENTIL HFA;VENTOLIN HFA) 108 (90 BASE) MCG/ACT inhaler Inhale 2 puffs into the lungs every 6 (six) hours as needed for wheezing (wheezing). Shortness of breath 09/11/12   Gregor Hams, MD  albuterol (PROVENTIL HFA;VENTOLIN HFA) 108 (90 BASE) MCG/ACT inhaler Inhale 1-2 puffs into the lungs every 6 (six) hours as needed for wheezing or shortness of breath. 10/04/14   Jennifer L Piepenbrink, PA-C   albuterol (PROVENTIL HFA;VENTOLIN HFA) 108 (90 BASE) MCG/ACT inhaler Inhale 2 puffs into the lungs every 4 (four) hours as needed for wheezing. 12/09/14   Liam Graham, PA-C  albuterol (PROVENTIL) (2.5 MG/3ML) 0.083% nebulizer solution Take 3 mLs (2.5 mg total) by nebulization every 6 (six) hours as needed for wheezing or shortness of breath. 10/04/14   Jennifer L Piepenbrink, PA-C  CRESTOR 20 MG tablet Take 20 mg by mouth daily. 12/21/13   Historical Provider, MD  doxycycline (VIBRAMYCIN) 100 MG capsule Take 1 capsule (100 mg total) by mouth 2 (two) times daily. 12/09/14   Liam Graham, PA-C  hydrochlorothiazide (HYDRODIURIL) 12.5 MG tablet Take 1 tablet (12.5 mg total) by mouth daily. 09/04/14   April K Palumbo-Rasch, MD  ipratropium (ATROVENT) 0.03 % nasal spray Place 2 sprays into both nostrils every 12 (twelve) hours.    Historical Provider, MD  losartan (COZAAR) 50 MG tablet Take 1 tablet (50 mg total) by mouth daily. 04/17/13   Collene Gobble, MD  predniSONE (DELTASONE) 20 MG tablet Take 2 tablets (40 mg total) by mouth daily. 10/04/14   Jennifer L Piepenbrink, PA-C  predniSONE (DELTASONE) 50 MG tablet Take 1 tablet (50 mg total) by mouth daily with breakfast. 12/09/14   Liam Graham, PA-C  Tiotropium Bromide-Olodaterol (STIOLTO RESPIMAT) 2.5-2.5 MCG/ACT AERS Inhale 2 puffs into the lungs daily.    Historical Provider, MD  topiramate (TOPAMAX) 50 MG tablet Take 50 mg by mouth 2 (two) times daily as needed (headache & migraine pain). For headache pain    Historical Provider, MD   BP 152/87 mmHg  Pulse 73  Temp(Src) 97.9 F (36.6 C) (Oral)  Resp 16  SpO2 98% Physical Exam  Constitutional: She is oriented to person, place, and time. Vital signs are normal. She appears well-developed and well-nourished. No distress.  HENT:  Head: Normocephalic and atraumatic.  Right Ear: Tympanic membrane, external ear and ear canal normal.  Left Ear: Tympanic membrane, external ear and ear canal  normal.  Nose: Nose normal. Right sinus exhibits no maxillary sinus tenderness and no frontal sinus tenderness. Left sinus exhibits no maxillary sinus tenderness and no frontal sinus tenderness.  Mouth/Throat: Uvula is midline, oropharynx is clear and moist and mucous membranes are normal. No oropharyngeal exudate.  Eyes: Conjunctivae are normal. Right eye exhibits no discharge. Left eye exhibits no discharge.  Neck: Normal range of motion. Neck supple.  Pulmonary/Chest: Effort normal. No respiratory distress. She has wheezes (very slight, scattered, expiratory). She has no rhonchi. She has no rales.  Lymphadenopathy:    She has no cervical adenopathy.  Neurological: She is alert and oriented to person, place, and time. She has normal strength. Coordination normal.  Skin: Skin is warm and dry. No rash  noted. She is not diaphoretic.  Psychiatric: She has a normal mood and affect. Judgment normal.  Nursing note and vitals reviewed.   ED Course  Procedures (including critical care time) Labs Review Labs Reviewed - No data to display  Imaging Review No results found.   MDM   1. URI (upper respiratory infection)    Treat his mild COPD exacerbation with prednisone, antibiotics, inhaler. Follow up when necessary   Meds ordered this encounter  Medications  . predniSONE (DELTASONE) 50 MG tablet    Sig: Take 1 tablet (50 mg total) by mouth daily with breakfast.    Dispense:  5 tablet    Refill:  0  . albuterol (PROVENTIL HFA;VENTOLIN HFA) 108 (90 BASE) MCG/ACT inhaler    Sig: Inhale 2 puffs into the lungs every 4 (four) hours as needed for wheezing.    Dispense:  1 Inhaler    Refill:  0  . doxycycline (VIBRAMYCIN) 100 MG capsule    Sig: Take 1 capsule (100 mg total) by mouth 2 (two) times daily.    Dispense:  20 capsule    Refill:  Storla Toby Ayad, PA-C 12/09/14 1742

## 2014-12-24 ENCOUNTER — Emergency Department (INDEPENDENT_AMBULATORY_CARE_PROVIDER_SITE_OTHER)
Admission: EM | Admit: 2014-12-24 | Discharge: 2014-12-24 | Disposition: A | Discharge status: Home / Self Care | Payer: Medicare Other | Source: Home / Self Care | Attending: Family Medicine | Admitting: Family Medicine

## 2014-12-24 ENCOUNTER — Ambulatory Visit (HOSPITAL_COMMUNITY): Payer: Medicare Other | Attending: Family Medicine

## 2014-12-24 ENCOUNTER — Encounter (HOSPITAL_COMMUNITY): Payer: Self-pay | Admitting: Emergency Medicine

## 2014-12-24 DIAGNOSIS — I10 Essential (primary) hypertension: Secondary | ICD-10-CM | POA: Insufficient documentation

## 2014-12-24 DIAGNOSIS — J449 Chronic obstructive pulmonary disease, unspecified: Secondary | ICD-10-CM | POA: Diagnosis not present

## 2014-12-24 DIAGNOSIS — R05 Cough: Secondary | ICD-10-CM | POA: Diagnosis not present

## 2014-12-24 DIAGNOSIS — R059 Cough, unspecified: Secondary | ICD-10-CM

## 2014-12-24 DIAGNOSIS — Z720 Tobacco use: Secondary | ICD-10-CM

## 2014-12-24 DIAGNOSIS — R079 Chest pain, unspecified: Secondary | ICD-10-CM | POA: Insufficient documentation

## 2014-12-24 DIAGNOSIS — J410 Simple chronic bronchitis: Secondary | ICD-10-CM

## 2014-12-24 DIAGNOSIS — J40 Bronchitis, not specified as acute or chronic: Secondary | ICD-10-CM

## 2014-12-24 MED ORDER — IPRATROPIUM BROMIDE 0.06 % NA SOLN: 2.0000 | Freq: Four times a day (QID) | NASAL | Status: DC

## 2014-12-24 MED ORDER — GUAIFENESIN-CODEINE 100-10 MG/5ML PO SYRP: 10.0000 mL | ORAL_SOLUTION | Freq: Four times a day (QID) | ORAL | Status: DC | PRN

## 2014-12-24 MED ORDER — LEVOFLOXACIN 500 MG PO TABS: 500.0000 mg | ORAL_TABLET | Freq: Every day | ORAL | Status: DC

## 2014-12-24 NOTE — ED Notes (Signed)
Pt states that she was seen 12/09/2014 pt states that her symptoms have not worsened but have yet to resolve.

## 2014-12-24 NOTE — ED Provider Notes (Addendum)
CSN: [id number]     Arrival date & time 12/24/14  15 History   First MD Initiated Contact with Patient 12/24/14 1650     Chief Complaint  Patient presents with  . Cough  . Wheezing  . Nasal Congestion   (Consider location/radiation/quality/duration/timing/severity/associated sxs/prior Treatment) Patient is a 69 y.o. female presenting with cough. The history is provided by the patient.  Cough Cough characteristics:  Productive Sputum characteristics:  Yellow Severity:  Moderate Onset quality:  Gradual Duration:  2 weeks Progression:  Unchanged Chronicity:  Chronic Smoker: no (qiut 22mos ago)   Context comment:  Left pectoral cp to palpation  from coughing, continues to cough, and feel sob. Associated symptoms: chest pain, rhinorrhea, shortness of breath and wheezing   Associated symptoms: no fever     Past Medical History  Diagnosis Date  . Hypertension   . Hyperlipemia   . GERD (gastroesophageal reflux disease)   . Rectal bleeding   . Blood transfusion ~ 1955    "before heart OR" (05/09/2013)  . COPD (chronic obstructive pulmonary disease)   . Weight loss   . Nasal congestion   . Wheezing   . Asthma   . Hemorrhoids   . Urinary frequency   . Hypertension   . Exertional shortness of breath   . History of stomach ulcers 1990's  . Migraines   . Arthritis     "knees" (05/09/2013)   Past Surgical History  Procedure Laterality Date  . Heart surgery at 59yr  1955    "blood was going thru my lungs but not thru my heart like it should" (05/09/2013)  . Appendectomy  1970's  . Colonoscopy    . Left oophorectomy Left ~ 1985  . Colonoscopy  12/22/2011    Procedure: COLONOSCOPY;  Surgeon: Lear Ng, MD;  Location: [facility];  Service: Endoscopy;  Laterality: N/A;  . Tumor excision Left ~ 1984    "under breast" (05/09/2013)  . Knee arthroscopy Left ~ 2004; ~ 2006  . Lipoma excision  02/15/2012    Procedure: EXCISION LIPOMA;  Surgeon: Joyice Faster. Cornett, MD;  Location:  [place];  Service: General;  Laterality: Left;  excision lipoma Left Lower abdomen  . 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 Tubal ligation Left ~ 1985  . 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 Colon cancer Father   . Cancer Father     colon  . Heart disease Father   . Malignant hyperthermia Neg Hx   . Heart disease Paternal Uncle   . Diabetes Sister    History  Substance Use Topics  . Smoking status: Former Smoker -- 2.00 packs/day for 35 years    Types: Cigarettes    Quit date: 08/20/2013  . Smokeless tobacco: Never Used     Comment: Currently exposed to 2nd hand smoke in home   . Alcohol Use: No   OB History    No data available     Review of Systems  Constitutional: Negative.  Negative for fever.  HENT: Positive for rhinorrhea.   Respiratory: Positive for cough, shortness of breath and wheezing.   Cardiovascular: Positive for chest pain. Negative for palpitations and leg swelling.    Allergies  Sulfa antibiotics; Amoxicillin; Azithromycin; Penicillins; Propoxyphene n-acetaminophen; and Clarithromycin  Home Medications  Prior to Admission medications   Medication Sig Start Date End Date Taking? Authorizing Provider  albuterol (PROVENTIL HFA;VENTOLIN HFA) 108 (90 BASE) MCG/ACT inhaler Inhale 2 puffs into the lungs every 6 (six) hours as needed for wheezing (wheezing). Shortness of breath 09/11/12   Gregor Hams, MD  albuterol (PROVENTIL HFA;VENTOLIN HFA) 108 (90 BASE) MCG/ACT inhaler Inhale 1-2 puffs into the lungs every 6 (six) hours as needed for wheezing or shortness of breath. 10/04/14   Jennifer L Piepenbrink, PA-C  albuterol (PROVENTIL HFA;VENTOLIN HFA) 108 (90 BASE) MCG/ACT inhaler Inhale 2 puffs into the lungs every 4 (four) hours as needed for wheezing. 12/09/14    Liam Graham, PA-C  albuterol (PROVENTIL) (2.5 MG/3ML) 0.083% nebulizer solution Take 3 mLs (2.5 mg total) by nebulization every 6 (six) hours as needed for wheezing or shortness of breath. 10/04/14   Jennifer L Piepenbrink, PA-C  CRESTOR 20 MG tablet Take 20 mg by mouth daily. 12/21/13   Historical Provider, MD  doxycycline (VIBRAMYCIN) 100 MG capsule Take 1 capsule (100 mg total) by mouth 2 (two) times daily. 12/09/14   Freeman Caldron Baker, PA-C  guaiFENesin-codeine (ROBITUSSIN AC) 100-10 MG/5ML syrup Take 10 mLs by mouth 4 (four) times daily as needed for cough. 12/24/14   Billy Fischer, MD  hydrochlorothiazide (HYDRODIURIL) 12.5 MG tablet Take 1 tablet (12.5 mg total) by mouth daily. 09/04/14   April K Palumbo-Rasch, MD  ipratropium (ATROVENT) 0.06 % nasal spray Place 2 sprays into both nostrils 4 (four) times daily. 12/24/14   Billy Fischer, MD  levofloxacin (LEVAQUIN) 500 MG tablet Take 1 tablet (500 mg total) by mouth daily. 12/24/14   Billy Fischer, MD  losartan (COZAAR) 50 MG tablet Take 1 tablet (50 mg total) by mouth daily. 04/17/13   Collene Gobble, MD  predniSONE (DELTASONE) 20 MG tablet Take 2 tablets (40 mg total) by mouth daily. 10/04/14   Jennifer L Piepenbrink, PA-C  predniSONE (DELTASONE) 50 MG tablet Take 1 tablet (50 mg total) by mouth daily with breakfast. 12/09/14   Liam Graham, PA-C  Tiotropium Bromide-Olodaterol (STIOLTO RESPIMAT) 2.5-2.5 MCG/ACT AERS Inhale 2 puffs into the lungs daily.    Historical Provider, MD  topiramate (TOPAMAX) 50 MG tablet Take 50 mg by mouth 2 (two) times daily as needed (headache & migraine pain). For headache pain    Historical Provider, MD   BP 116/77 mmHg  Pulse 83  Temp(Src) 97.8 F (36.6 C) (Oral)  Resp 16  SpO2 97% Physical Exam  Constitutional: She is oriented to person, place, and time. She appears well-developed and well-nourished. No distress.  HENT:  Head: Normocephalic.  Right Ear: External ear normal.  Left Ear: External ear normal.   Mouth/Throat: Oropharynx is clear and moist.  Neck: Normal range of motion. Neck supple.  Cardiovascular: Normal rate, normal heart sounds and intact distal pulses.   Pulmonary/Chest: Effort normal. She has wheezes. She exhibits tenderness.  Lymphadenopathy:    She has no cervical adenopathy.  Neurological: She is alert and oriented to person, place, and time.  Skin: Skin is warm and dry.  Nursing note and vitals reviewed.   ED Course  Procedures (including critical care time) Labs Review Labs Reviewed - No data to display  Imaging Review Dg Chest 2 View  12/24/2014   CLINICAL DATA:  Chest pain and cough for 2 weeks. Hypertension. COPD.  EXAM: CHEST  2 VIEW  COMPARISON:  Multiple exams, including 10/04/2014  FINDINGS: Mild scarring along the lingula.  The lungs appear otherwise clear. Cardiac and mediastinal margins appear normal. Mild thoracic spondylosis and scoliosis. No pleural effusion.  IMPRESSION: 1. Chronic mild lingular scarring. Otherwise, no significant abnormalities are observed.   Electronically Signed   By: Sherryl Barters M.D.   On: 12/24/2014 18:08   X-rays reviewed and report per radiologist.   MDM   1. Bronchitis due to tobacco use   2. Coughing        Billy Fischer, MD 12/24/14 1719  Billy Fischer, MD 12/24/14 [phone]

## 2014-12-24 NOTE — Discharge Instructions (Signed)
Take all of medicine, drink lots of fluids, no more smoking, see your doctor if further problems  °

## 2015-01-14 ENCOUNTER — Encounter (HOSPITAL_COMMUNITY): Payer: Self-pay | Admitting: Emergency Medicine

## 2015-01-14 ENCOUNTER — Emergency Department (HOSPITAL_COMMUNITY)
Admission: EM | Admit: 2015-01-14 | Discharge: 2015-01-14 | Disposition: A | Discharge status: Home / Self Care | Payer: Medicare Other | Attending: Emergency Medicine | Admitting: Emergency Medicine

## 2015-01-14 ENCOUNTER — Emergency Department (HOSPITAL_COMMUNITY): Payer: Medicare Other

## 2015-01-14 DIAGNOSIS — Z8639 Personal history of other endocrine, nutritional and metabolic disease: Secondary | ICD-10-CM | POA: Insufficient documentation

## 2015-01-14 DIAGNOSIS — Y9289 Other specified places as the place of occurrence of the external cause: Secondary | ICD-10-CM | POA: Insufficient documentation

## 2015-01-14 DIAGNOSIS — J449 Chronic obstructive pulmonary disease, unspecified: Secondary | ICD-10-CM | POA: Diagnosis not present

## 2015-01-14 DIAGNOSIS — Z87891 Personal history of nicotine dependence: Secondary | ICD-10-CM | POA: Diagnosis not present

## 2015-01-14 DIAGNOSIS — S22089A Unspecified fracture of T11-T12 vertebra, initial encounter for closed fracture: Secondary | ICD-10-CM

## 2015-01-14 DIAGNOSIS — Z792 Long term (current) use of antibiotics: Secondary | ICD-10-CM | POA: Insufficient documentation

## 2015-01-14 DIAGNOSIS — G43909 Migraine, unspecified, not intractable, without status migrainosus: Secondary | ICD-10-CM | POA: Insufficient documentation

## 2015-01-14 DIAGNOSIS — S8991XA Unspecified injury of right lower leg, initial encounter: Secondary | ICD-10-CM

## 2015-01-14 DIAGNOSIS — W19XXXA Unspecified fall, initial encounter: Secondary | ICD-10-CM

## 2015-01-14 DIAGNOSIS — I10 Essential (primary) hypertension: Secondary | ICD-10-CM | POA: Diagnosis not present

## 2015-01-14 DIAGNOSIS — Z8659 Personal history of other mental and behavioral disorders: Secondary | ICD-10-CM | POA: Insufficient documentation

## 2015-01-14 DIAGNOSIS — Z8719 Personal history of other diseases of the digestive system: Secondary | ICD-10-CM | POA: Diagnosis not present

## 2015-01-14 DIAGNOSIS — Y9389 Activity, other specified: Secondary | ICD-10-CM | POA: Diagnosis not present

## 2015-01-14 DIAGNOSIS — Z880 Allergy status to penicillin: Secondary | ICD-10-CM | POA: Insufficient documentation

## 2015-01-14 DIAGNOSIS — Y998 Other external cause status: Secondary | ICD-10-CM | POA: Diagnosis not present

## 2015-01-14 DIAGNOSIS — W01198A Fall on same level from slipping, tripping and stumbling with subsequent striking against other object, initial encounter: Secondary | ICD-10-CM | POA: Insufficient documentation

## 2015-01-14 DIAGNOSIS — S3992XA Unspecified injury of lower back, initial encounter: Secondary | ICD-10-CM | POA: Diagnosis present

## 2015-01-14 DIAGNOSIS — Z7951 Long term (current) use of inhaled steroids: Secondary | ICD-10-CM | POA: Diagnosis not present

## 2015-01-14 DIAGNOSIS — Z7952 Long term (current) use of systemic steroids: Secondary | ICD-10-CM | POA: Diagnosis not present

## 2015-01-14 MED ORDER — HYDROCODONE-ACETAMINOPHEN 5-325 MG PO TABS: 1.0000 | ORAL_TABLET | Freq: Four times a day (QID) | ORAL | Status: DC | PRN

## 2015-01-14 MED ORDER — ACETAMINOPHEN 325 MG PO TABS
650.0000 mg | ORAL_TABLET | Freq: Once | ORAL | Status: AC
  Administered 2015-01-14: 650 mg via ORAL
  Filled 2015-01-14: qty 2

## 2015-01-14 NOTE — ED Notes (Signed)
Pt fell at [facility] and hit right knee and coccyx. Now having right knee, back pain, and tailbone pain. Hasn't taken anything for pain. Is currently holding ice on right knee. Ambulatory. RR even/unlabored.

## 2015-01-14 NOTE — ED Provider Notes (Signed)
CSN: [id number]     Arrival date & time 01/14/15  1654 History  This chart was scribed for non-physician practitioner Domenic Moras, PA-C working with Ephraim Hamburger, MD by Zola Button, ED Scribe. This patient was seen in room WTR6/WTR6 and the patient's care was started at 5:12 PM.     Chief Complaint  Patient presents with  . Fall  . Knee Pain    right  . Tailbone Pain  . Back Pain   The history is provided by the patient. No language interpreter was used.    HPI Comments: Summer Jackson is a 69 y.o. female who presents to the Emergency Department complaining of a fall that occurred around 3:45 PM today at [facility] when she slipped on an orange sample on the floor. Patient states she fell backwards striking her back against ground.  Pt reports having sharp pain in her right knee, back, and tailbone. She states there is some swelling in her right knee. The back pain is the most severe of her pains. Patient states she is able to ambulate, but has pain when doing so. She has not tried anything for her pain. Patient denies LOC and head injury. She also denies being on blood thinners. She notes having allergies to Darvoset, amoxicillin, and penicillin. Patient did drive herself here today. She has a PSHx of left knee arthroplasty in 2014.    Past Medical History  Diagnosis Date  . Hypertension   . Hyperlipemia   . GERD (gastroesophageal reflux disease)   . Rectal bleeding   . Blood transfusion ~ 1955    "before heart OR" (05/09/2013)  . COPD (chronic obstructive pulmonary disease)   . Weight loss   . Nasal congestion   . Wheezing   . Asthma   . Hemorrhoids   . Urinary frequency   . Hypertension   . Exertional shortness of breath   . History of stomach ulcers 1990's  . Migraines   . Arthritis     "knees" (05/09/2013)   Past Surgical History  Procedure Laterality Date  . Heart surgery at 29yr  1955    "blood was going thru my lungs but not thru my heart like it should"  (05/09/2013)  . Appendectomy  1970's  . Colonoscopy    . Left oophorectomy Left ~ 1985  . Colonoscopy  12/22/2011    Procedure: COLONOSCOPY;  Surgeon: Lear Ng, MD;  Location: [facility];  Service: Endoscopy;  Laterality: N/A;  . Tumor excision Left ~ 1984    "under breast" (05/09/2013)  . Knee arthroscopy Left ~ 2004; ~ 2006  . Lipoma excision  02/15/2012    Procedure: EXCISION LIPOMA;  Surgeon: Joyice Faster. Cornett, MD;  Location: [place];  Service: General;  Laterality: Left;  excision lipoma Left Lower abdomen  . 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 Tubal ligation Left ~ 1985  . 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 Colon cancer Father   . Cancer Father     colon  . Heart disease Father   . Malignant hyperthermia Neg Hx   . Heart disease Paternal Uncle   . Diabetes Sister    History  Substance Use Topics  . Smoking status: Former Smoker -- 2.00 packs/day for 35 years  Types: Cigarettes    Quit date: 08/20/2013  . Smokeless tobacco: Never Used     Comment: Currently exposed to 2nd hand smoke in home   . Alcohol Use: No   OB History    No data available     Review of Systems  Musculoskeletal: Positive for back pain and arthralgias.  Neurological: Negative for syncope and headaches.      Allergies  Sulfa antibiotics; Amoxicillin; Azithromycin; Penicillins; Propoxyphene n-acetaminophen; and Clarithromycin  Home Medications   Prior to Admission medications   Medication Sig Start Date End Date Taking? Authorizing Provider  albuterol (PROVENTIL HFA;VENTOLIN HFA) 108 (90 BASE) MCG/ACT inhaler Inhale 2 puffs into the lungs every 6 (six) hours as needed for wheezing (wheezing). Shortness of breath 09/11/12   Gregor Hams, MD  albuterol (PROVENTIL HFA;VENTOLIN  HFA) 108 (90 BASE) MCG/ACT inhaler Inhale 1-2 puffs into the lungs every 6 (six) hours as needed for wheezing or shortness of breath. 10/04/14   Jennifer L Piepenbrink, PA-C  albuterol (PROVENTIL HFA;VENTOLIN HFA) 108 (90 BASE) MCG/ACT inhaler Inhale 2 puffs into the lungs every 4 (four) hours as needed for wheezing. 12/09/14   Liam Graham, PA-C  albuterol (PROVENTIL) (2.5 MG/3ML) 0.083% nebulizer solution Take 3 mLs (2.5 mg total) by nebulization every 6 (six) hours as needed for wheezing or shortness of breath. 10/04/14   Jennifer L Piepenbrink, PA-C  CRESTOR 20 MG tablet Take 20 mg by mouth daily. 12/21/13   Historical Provider, MD  doxycycline (VIBRAMYCIN) 100 MG capsule Take 1 capsule (100 mg total) by mouth 2 (two) times daily. 12/09/14   Freeman Caldron Baker, PA-C  guaiFENesin-codeine (ROBITUSSIN AC) 100-10 MG/5ML syrup Take 10 mLs by mouth 4 (four) times daily as needed for cough. 12/24/14   Billy Fischer, MD  hydrochlorothiazide (HYDRODIURIL) 12.5 MG tablet Take 1 tablet (12.5 mg total) by mouth daily. 09/04/14   April K Palumbo-Rasch, MD  ipratropium (ATROVENT) 0.06 % nasal spray Place 2 sprays into both nostrils 4 (four) times daily. 12/24/14   Billy Fischer, MD  levofloxacin (LEVAQUIN) 500 MG tablet Take 1 tablet (500 mg total) by mouth daily. 12/24/14   Billy Fischer, MD  losartan (COZAAR) 50 MG tablet Take 1 tablet (50 mg total) by mouth daily. 04/17/13   Collene Gobble, MD  predniSONE (DELTASONE) 20 MG tablet Take 2 tablets (40 mg total) by mouth daily. 10/04/14   Jennifer L Piepenbrink, PA-C  predniSONE (DELTASONE) 50 MG tablet Take 1 tablet (50 mg total) by mouth daily with breakfast. 12/09/14   Liam Graham, PA-C  Tiotropium Bromide-Olodaterol (STIOLTO RESPIMAT) 2.5-2.5 MCG/ACT AERS Inhale 2 puffs into the lungs daily.    Historical Provider, MD  topiramate (TOPAMAX) 50 MG tablet Take 50 mg by mouth 2 (two) times daily as needed (headache & migraine pain). For headache pain    Historical  Provider, MD   BP 136/70 mmHg  Pulse 79  Temp(Src) 97.6 F (36.4 C) (Oral)  Resp 16  SpO2 100% Physical Exam  Constitutional: She is oriented to person, place, and time. She appears well-developed and well-nourished. No distress.  HENT:  Head: Normocephalic and atraumatic.  Mouth/Throat: Oropharynx is clear and moist. No oropharyngeal exudate.  Eyes: Pupils are equal, round, and reactive to light.  Neck: Neck supple.  Cardiovascular: Normal rate.   Pulmonary/Chest: Effort normal.  Musculoskeletal: She exhibits tenderness. She exhibits no edema.  TTP lumbar and lumbosacral region. No overlying skin changes or crepitus. Right knee: Point tenderness  to infrapattellar region. No obvious deformity. Normal knee flexion and extension.   Neurological: She is alert and oriented to person, place, and time. No cranial nerve deficit.  Skin: Skin is warm and dry. No rash noted.  Psychiatric: She has a normal mood and affect. Her behavior is normal.  Nursing note and vitals reviewed.   ED Course  Procedures  DIAGNOSTIC STUDIES: Oxygen Saturation is 100% on room air, normal by my interpretation.    COORDINATION OF CARE: 5:16 PM-Discussed treatment plan which includes XR of lumbar spine and knee with pt at bedside and pt agreed to plan.   6:42 PM Lumbar x-ray demonstrated evidence of a questionable T12 fracture. This is a closed fracture. On reexamination patient does have tenderness overlying the affected area. Patient will benefit from pain medication and close follow-up with neurosurgery for further management. She has no focal neuro deficit on exam. She also sprained her right knee, x-ray of the right knee show no evidence of acute fracture or dislocation. Knee sleeve provided for support. Pain medication prescribed. Return precautions discussed. Patient is able to ambulate, voice understanding and agrees with plan.  Care discussed with Dr. Regenia Skeeter who has evaluated patient and agrees with  plan.   Labs Review Labs Reviewed - No data to display  Imaging Review Dg Lumbar Spine Complete  01/14/2015   CLINICAL DATA:  Initial encounter for sloping on 1 sheath heel today at grocery store and falling. Landed on right knee and injured low back. Pain in low back and tail bone.  EXAM: LUMBAR SPINE - COMPLETE 4+ VIEW  COMPARISON:  None.  FINDINGS: Four views study shows no gross fracture. There is superior endplate irregularity at T12 raising the question of fracture without appreciable loss of vertebral body height at this level. Loss of disc height is seen at L3-4 and L4-5. SI joints are normal. Arcuate lines of the sacrum are preserved.  IMPRESSION: Question fracture of the T12 superior endplate without appreciable loss of vertebral body height.   Electronically Signed   By: Misty Stanley M.D.   On: 01/14/2015 18:04   Dg Knee Complete 4 Views Right  01/14/2015   CLINICAL DATA:  Slipped on an orange peel at grocery store and fell. Landed on right knee. Low back pain.  EXAM: RIGHT KNEE - COMPLETE 4+ VIEW  COMPARISON:  None.  FINDINGS: No fracture or dislocation. Generalized osteopenia. No significant joint effusion. 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  IMPRESSION: No acute osseous injury of the right knee.   Electronically Signed   By: Kathreen Devoid   On: 01/14/2015 18:07     EKG Interpretation None      MDM   Final diagnoses:  Fall  T12 vertebral fracture, closed, initial encounter  Right knee injury, initial encounter    BP 136/70 mmHg  Pulse 79  Temp(Src) 97.6 F (36.4 C) (Oral)  Resp 16  SpO2 100%  I have reviewed nursing notes and vital signs. I personally reviewed the imaging tests through PACS system  I reviewed available ER/hospitalization records thought the EMR   I personally performed the services described in this documentation, which was scribed in my presence. The recorded information has been reviewed and is accurate.     Domenic Moras,  PA-C 01/14/15 1844  Carmin Muskrat, MD 01/15/15 [phone]

## 2015-01-14 NOTE — Discharge Instructions (Signed)
You have been evaluated for a fall.  It appears you have broken your spine at the level of T12.  Take pain medication as needed.  Follow up with a neurosurgeon for further care.    Vertebral Fracture You have a fracture of one or more vertebra. These are the bony parts that form the spine. Minor vertebral fractures happen when people fall. Osteoporosis is associated with many of these fractures. Hospital care may not be necessary for minor compression fractures that are stable. However, multiple fractures of the spine or unstable injuries can cause severe pain and even damage the spinal cord. A spinal cord injury may cause paralysis, numbness, or loss of normal bowel and bladder control.  Normally there is pain and stiffness in the back for 3 to 6 weeks after a vertebral fracture. Bed rest for several days, pain medicine, and a slow return to activity is often the only treatment that is needed depending on the location of the fracture. Neck and back braces may be helpful in reducing pain and increasing mobility. When your pain allows, you should begin walking or swimming to help maintain your endurance. Exercises to improve motion and to strengthen the back may also be useful after the initial pain improves. Treatment for osteoporosis may be essential for full recovery. This will help reduce your risk of vertebral fractures with a future fall. During the first few days after a spine fracture you may feel nauseated or vomit. If this is severe, hospital care with IV fluids will be needed.  Arrange for follow-up care as recommended to assure proper long-term care and prevention of further spine injury.  SEEK IMMEDIATE MEDICAL CARE IF:  You have increasing pain, vomiting, or are unable to move around at all.  You develop numbness, tingling, weakness, or paralysis of any part of your body.  You develop a loss of normal bowel or bladder control.  You have difficulty breathing, cough, fever, chest or  abdominal pain. MAKE SURE YOU:   Understand these instructions.  Will watch your condition.  Will get help right away if you are not doing well or get worse. Document Released: 01/12/2005 Document Revised: 02/27/2012 Document Reviewed: 07/28/2009 [facility] Patient Information 2015 [place], [place]. This information is not intended to replace advice given to you by your health care provider. Make sure you discuss any questions you have with your health care provider.

## 2015-02-25 ENCOUNTER — Ambulatory Visit (INDEPENDENT_AMBULATORY_CARE_PROVIDER_SITE_OTHER): Payer: Medicare Other | Admitting: Emergency Medicine

## 2015-02-25 ENCOUNTER — Ambulatory Visit (INDEPENDENT_AMBULATORY_CARE_PROVIDER_SITE_OTHER): Payer: Medicare Other

## 2015-02-25 VITALS — BP 140/90 | HR 80 | Temp 98.1°F | Resp 20 | Ht 61.0 in | Wt 186.0 lb

## 2015-02-25 DIAGNOSIS — S9032XA Contusion of left foot, initial encounter: Secondary | ICD-10-CM

## 2015-02-25 MED ORDER — ACETAMINOPHEN-CODEINE #3 300-30 MG PO TABS: 1.0000 | ORAL_TABLET | ORAL | Status: DC | PRN

## 2015-02-25 NOTE — Patient Instructions (Signed)

## 2015-02-25 NOTE — Progress Notes (Signed)
[facility] ([place]) [address], [place] [address] [phone]  Date:  02/25/2015   Name:  Summer Jackson   DOB:  1946/08/01   MRN:  [id number]  PCP:  Elyn Peers, MD    Chief Complaint: Foot Injury   History of Present Illness:  Summer Jackson is a 69 y.o. very pleasant female patient who presents with the following:  Patient dropped a carrier full of 2 liter soft drinks on her left foot a week ago Still has pain, swelling and ecchymosis of dorsum of foot. Pain increases with weight bearing.  Less at rest. Has not improved during interval No improvement with over the counter medications or other home remedies.  Denies other complaint or health concern today.   Patient Active Problem List   Diagnosis Date Noted  . Constipation 05/29/2013  . Allergy to pollen 05/29/2013  . Hypertension   . Hyperlipemia   . Headache(784.0)   . GERD (gastroesophageal reflux disease)   . Weight loss   . Nasal congestion   . Left shoulder pain 02/05/2013  . Chronic cough 01/24/2013  . Prediabetes 01/10/2013  . Lipoma of abdominal wall 01/23/2012  . Hemorrhoids 01/23/2012  . Rectal bleeding 12/22/2011  . Family history of malignant neoplasm of gastrointestinal tract 12/22/2011  . OSTEOARTHRITIS, KNEE, LEFT 09/10/2009  . GERD 08/25/2009  . SHINGLES, RECURRENT 09/30/2008  . HYPERLIPIDEMIA 08/25/2007  . COMMON MIGRAINE 08/09/2007  . HYPERTENSION 08/09/2007  . CONGENITAL HEART DISEASE, 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    Past Medical History  Diagnosis Date  . Hypertension   . Hyperlipemia   . GERD (gastroesophageal reflux disease)   . Rectal bleeding   . Blood transfusion ~ 1955    "before heart OR" (05/09/2013)  . COPD (chronic obstructive pulmonary disease)   . Weight loss   . Nasal congestion   . Wheezing   . Asthma   . Hemorrhoids   . Urinary frequency   . Hypertension   . Exertional shortness of breath   . History of stomach ulcers 1990's  . Migraines   .  Arthritis     "knees" (05/09/2013)    Past Surgical History  Procedure Laterality Date  . Heart surgery at 64yr  1955    "blood was going thru my lungs but not thru my heart like it should" (05/09/2013)  . Appendectomy  1970's  . Colonoscopy    . Left oophorectomy Left ~ 1985  . Colonoscopy  12/22/2011    Procedure: COLONOSCOPY;  Surgeon: Lear Ng, MD;  Location: [facility];  Service: Endoscopy;  Laterality: N/A;  . Tumor excision Left ~ 1984    "under breast" (05/09/2013)  . Knee arthroscopy Left ~ 2004; ~ 2006  . Lipoma excision  02/15/2012    Procedure: EXCISION LIPOMA;  Surgeon: Joyice Faster. Cornett, MD;  Location: [place];  Service: General;  Laterality: Left;  excision lipoma Left Lower abdomen  . 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 Tubal ligation Left ~ 1985  . Total knee arthroplasty Left 05/08/2013    Procedure: TOTAL KNEE ARTHROPLASTY;  Surgeon: Ninetta Lights, MD;  Location: [place];  Service: Orthopedics;  Laterality: Left;    History  Substance Use Topics  . Smoking status: Former Smoker -- 2.00 packs/day for 35 years    Types: Cigarettes    Quit date: 08/20/2013  . Smokeless tobacco: Never Used     Comment: Currently exposed to 2nd hand smoke in home   .  Alcohol Use: No    Family History  Problem Relation Age of Onset  . Colon cancer Father   . Cancer Father     colon  . Heart disease Father   . Malignant hyperthermia Neg Hx   . Heart disease Paternal Uncle   . Diabetes Sister     Allergies  Allergen Reactions  . Sulfa Antibiotics Swelling and Rash  . Amoxicillin Other (See Comments)    REACTION: headache  . Azithromycin Other (See Comments)    REACTION: reddness around mouth  . Penicillins Other (See Comments)    REACTION: headache  . Propoxyphene N-Acetaminophen Other (See Comments)    REACTION: mouth sores  . Clarithromycin Rash    REACTION: skin rash with minimal vesicular formation on arms and hands    Medication  list has been reviewed and updated.  Current Outpatient Prescriptions on File Prior to Visit  Medication Sig Dispense Refill  . albuterol (PROVENTIL HFA;VENTOLIN HFA) 108 (90 BASE) MCG/ACT inhaler Inhale 2 puffs into the lungs every 6 (six) hours as needed for wheezing (wheezing). Shortness of breath    . albuterol (PROVENTIL) (2.5 MG/3ML) 0.083% nebulizer solution Take 3 mLs (2.5 mg total) by nebulization every 6 (six) hours as needed for wheezing or shortness of breath. 75 mL 12  . CRESTOR 20 MG tablet Take 20 mg by mouth daily.    Marland Kitchen guaiFENesin-codeine (ROBITUSSIN AC) 100-10 MG/5ML syrup Take 10 mLs by mouth 4 (four) times daily as needed for cough. 180 mL 0  . HYDROcodone-acetaminophen (NORCO/VICODIN) 5-325 MG per tablet Take 1 tablet by mouth every 6 (six) hours as needed for moderate pain or severe pain. 20 tablet 0  . ipratropium (ATROVENT) 0.06 % nasal spray Place 2 sprays into both nostrils 4 (four) times daily. 15 mL 1  . predniSONE (DELTASONE) 20 MG tablet Take 2 tablets (40 mg total) by mouth daily. 10 tablet 0  . predniSONE (DELTASONE) 50 MG tablet Take 1 tablet (50 mg total) by mouth daily with breakfast. 5 tablet 0  . Tiotropium Bromide-Olodaterol (STIOLTO RESPIMAT) 2.5-2.5 MCG/ACT AERS Inhale 2 puffs into the lungs daily.    Marland Kitchen topiramate (TOPAMAX) 50 MG tablet Take 50 mg by mouth 2 (two) times daily as needed (headache & migraine pain). For headache pain    . hydrochlorothiazide (HYDRODIURIL) 12.5 MG tablet Take 1 tablet (12.5 mg total) by mouth daily. (Patient not taking: Reported on 02/25/2015) 15 tablet 0  . losartan (COZAAR) 50 MG tablet Take 1 tablet (50 mg total) by mouth daily. (Patient not taking: Reported on 02/25/2015) 30 tablet 11   No current facility-administered medications on file prior to visit.    Review of Systems:  As per HPI, otherwise negative.    Physical Examination: Filed Vitals:   02/25/15 1853  BP: 140/90  Pulse: 80  Temp: 98.1 F (36.7 C)  Resp:  20   Filed Vitals:   02/25/15 1853  Height: 5\' 1"  (1.549 m)  Weight: 186 lb (84.369 kg)   Body mass index is 35.16 kg/(m^2). Ideal Body Weight: Weight in (lb) to have BMI = 25: 132   GEN: WDWN, NAD, Non-toxic, Alert & Oriented x 3 HEENT: Atraumatic, Normocephalic.  Ears and Nose: No external deformity. EXTR: No clubbing/cyanosis/edema NEURO: Normal gait.  PSYCH: Normally interactive. Conversant. Not depressed or anxious appearing.  Calm demeanor.  LEFT foot:  Ecchymosis and swelling dorsum from midfoot to toes.  Very tender and guards.  No deformity  Assessment and Plan: Contusion foot  Elevation  tyl #3  Signed,  Ellison Carwin, MD   [facility] reading (PRIMARY) by  Dr. Ouida Sills.  Negative other than STS.

## 2015-03-06 ENCOUNTER — Ambulatory Visit: Payer: Medicare Other | Admitting: Emergency Medicine

## 2015-04-28 ENCOUNTER — Emergency Department (HOSPITAL_COMMUNITY)
Admission: EM | Admit: 2015-04-28 | Discharge: 2015-04-29 | Disposition: A | Discharge status: Home / Self Care | Payer: Medicare Other | Attending: Emergency Medicine | Admitting: Emergency Medicine

## 2015-04-28 ENCOUNTER — Encounter (HOSPITAL_COMMUNITY): Payer: Self-pay

## 2015-04-28 DIAGNOSIS — Z79899 Other long term (current) drug therapy: Secondary | ICD-10-CM | POA: Insufficient documentation

## 2015-04-28 DIAGNOSIS — J449 Chronic obstructive pulmonary disease, unspecified: Secondary | ICD-10-CM | POA: Diagnosis not present

## 2015-04-28 DIAGNOSIS — Z8719 Personal history of other diseases of the digestive system: Secondary | ICD-10-CM | POA: Diagnosis not present

## 2015-04-28 DIAGNOSIS — E785 Hyperlipidemia, unspecified: Secondary | ICD-10-CM | POA: Insufficient documentation

## 2015-04-28 DIAGNOSIS — M170 Bilateral primary osteoarthritis of knee: Secondary | ICD-10-CM | POA: Diagnosis not present

## 2015-04-28 DIAGNOSIS — G43909 Migraine, unspecified, not intractable, without status migrainosus: Secondary | ICD-10-CM | POA: Insufficient documentation

## 2015-04-28 DIAGNOSIS — Z87891 Personal history of nicotine dependence: Secondary | ICD-10-CM | POA: Insufficient documentation

## 2015-04-28 DIAGNOSIS — M7989 Other specified soft tissue disorders: Secondary | ICD-10-CM | POA: Diagnosis not present

## 2015-04-28 DIAGNOSIS — Z880 Allergy status to penicillin: Secondary | ICD-10-CM | POA: Diagnosis not present

## 2015-04-28 DIAGNOSIS — I10 Essential (primary) hypertension: Secondary | ICD-10-CM | POA: Insufficient documentation

## 2015-04-28 DIAGNOSIS — Z7952 Long term (current) use of systemic steroids: Secondary | ICD-10-CM | POA: Diagnosis not present

## 2015-04-28 NOTE — ED Notes (Signed)
Pt complains of bilateral feet swelling for two days, the left is worse than the right, pt states that one month ago some 2 liter drinks fell on the left foot and it has been sore but the redness and swelling started two days ago

## 2015-04-29 ENCOUNTER — Emergency Department (HOSPITAL_COMMUNITY): Payer: Medicare Other

## 2015-04-29 MED ORDER — HYDROCODONE-ACETAMINOPHEN 5-325 MG PO TABS: 1.0000 | ORAL_TABLET | ORAL | Status: DC | PRN

## 2015-04-29 MED ORDER — IBUPROFEN 600 MG PO TABS: 600.0000 mg | ORAL_TABLET | Freq: Four times a day (QID) | ORAL | Status: DC | PRN

## 2015-04-29 MED ORDER — HYDROCODONE-ACETAMINOPHEN 5-325 MG PO TABS
1.0000 | ORAL_TABLET | Freq: Once | ORAL | Status: AC
  Administered 2015-04-29: 1 via ORAL
  Filled 2015-04-29: qty 1

## 2015-04-29 NOTE — Discharge Instructions (Signed)
Cryotherapy °Cryotherapy means treatment with cold. Ice or gel packs can be used to reduce both pain and swelling. Ice is the most helpful within the first 24 to 48 hours after an injury or flare-up from overusing a muscle or joint. Sprains, strains, spasms, burning pain, shooting pain, and aches can all be eased with ice. Ice can also be used when recovering from surgery. Ice is effective, has very few side effects, and is safe for most people to use. °PRECAUTIONS  °Ice is not a safe treatment option for people with: °· Raynaud phenomenon. This is a condition affecting small blood vessels in the extremities. Exposure to cold may cause your problems to return. °· Cold hypersensitivity. There are many forms of cold hypersensitivity, including: °¨ Cold urticaria. Red, itchy hives appear on the skin when the tissues begin to warm after being iced. °¨ Cold erythema. This is a red, itchy rash caused by exposure to cold. °¨ Cold hemoglobinuria. Red blood cells break down when the tissues begin to warm after being iced. The hemoglobin that carry oxygen are passed into the urine because they cannot combine with blood proteins fast enough. °· Numbness or altered sensitivity in the area being iced. °If you have any of the following conditions, do not use ice until you have discussed cryotherapy with your caregiver: °· Heart conditions, such as arrhythmia, angina, or chronic heart disease. °· High blood pressure. °· Healing wounds or open skin in the area being iced. °· Current infections. °· Rheumatoid arthritis. °· Poor circulation. °· Diabetes. °Ice slows the blood flow in the region it is applied. This is beneficial when trying to stop inflamed tissues from spreading irritating chemicals to surrounding tissues. However, if you expose your skin to cold temperatures for too long or without the proper protection, you can damage your skin or nerves. Watch for signs of skin damage due to cold. °HOME CARE INSTRUCTIONS °Follow  these tips to use ice and cold packs safely. °· Place a dry or damp towel between the ice and skin. A damp towel will cool the skin more quickly, so you may need to shorten the time that the ice is used. °· For a more rapid response, add gentle compression to the ice. °· Ice for no more than 10 to 20 minutes at a time. The bonier the area you are icing, the less time it will take to get the benefits of ice. °· Check your skin after 5 minutes to make sure there are no signs of a poor response to cold or skin damage. °· Rest 20 minutes or more between uses. °· Once your skin is numb, you can end your treatment. You can test numbness by very lightly touching your skin. The touch should be so light that you do not see the skin dimple from the pressure of your fingertip. When using ice, most people will feel these normal sensations in this order: cold, burning, aching, and numbness. °· Do not use ice on someone who cannot communicate their responses to pain, such as small children or people with dementia. °HOW TO MAKE AN ICE PACK °Ice packs are the most common way to use ice therapy. Other methods include ice massage, ice baths, and cryosprays. Muscle creams that cause a cold, tingly feeling do not offer the same benefits that ice offers and should not be used as a substitute unless recommended by your caregiver. °To make an ice pack, do one of the following: °· Place crushed ice or a   bag of frozen vegetables in a sealable plastic bag. Squeeze out the excess air. Place this bag inside another plastic bag. Slide the bag into a pillowcase or place a damp towel between your skin and the bag. °· Mix 3 parts water with 1 part rubbing alcohol. Freeze the mixture in a sealable plastic bag. When you remove the mixture from the freezer, it will be slushy. Squeeze out the excess air. Place this bag inside another plastic bag. Slide the bag into a pillowcase or place a damp towel between your skin and the bag. °SEEK MEDICAL CARE  IF: °· You develop white spots on your skin. This may give the skin a blotchy (mottled) appearance. °· Your skin turns blue or pale. °· Your skin becomes waxy or hard. °· Your swelling gets worse. °MAKE SURE YOU:  °· Understand these instructions. °· Will watch your condition. °· Will get help right away if you are not doing well or get worse. °Document Released: 08/01/2011 Document Revised: 04/21/2014 Document Reviewed: 08/01/2011 °ExitCare® Patient Information ©2015 [employer]. This information is not intended to replace advice given to you by your health care provider. Make sure you discuss any questions you have with your health care provider. ° °

## 2015-04-29 NOTE — ED Provider Notes (Signed)
CSN: [id number]     Arrival date & time 04/28/15  2303 History   First MD Initiated Contact with Patient 04/29/15 0016     Chief Complaint  Patient presents with  . Foot Swelling     (Consider location/radiation/quality/duration/timing/severity/associated sxs/prior Treatment) Patient is a 69 y.o. female presenting with lower extremity pain. The history is provided by the patient. No language interpreter was used.  Foot Pain Pertinent negatives include no chills, fever or numbness. Associated symptoms comments: Left foot pain and swelling for the past one month since several 2-liter drink bottles fell from a shelf landing on the foot. She states the x-ray done at that time was negative for fracture but the foot remains swollen and painful. No fever, calf pain or wound. She states she has been unable to rest or elevate the foot due to family member that requires her care. .    Past Medical History  Diagnosis Date  . Hypertension   . Hyperlipemia   . GERD (gastroesophageal reflux disease)   . Rectal bleeding   . Blood transfusion ~ 1955    "before heart OR" (05/09/2013)  . COPD (chronic obstructive pulmonary disease)   . Weight loss   . Nasal congestion   . Wheezing   . Asthma   . Hemorrhoids   . Urinary frequency   . Hypertension   . Exertional shortness of breath   . History of stomach ulcers 1990's  . Migraines   . Arthritis     "knees" (05/09/2013)   Past Surgical History  Procedure Laterality Date  . Heart surgery at 6yr  1955    "blood was going thru my lungs but not thru my heart like it should" (05/09/2013)  . Appendectomy  1970's  . Colonoscopy    . Left oophorectomy Left ~ 1985  . Colonoscopy  12/22/2011    Procedure: COLONOSCOPY;  Surgeon: Lear Ng, MD;  Location: [facility];  Service: Endoscopy;  Laterality: N/A;  . Tumor excision Left ~ 1984    "under breast" (05/09/2013)  . Knee arthroscopy Left ~ 2004; ~ 2006  . Lipoma excision  02/15/2012     Procedure: EXCISION LIPOMA;  Surgeon: Joyice Faster. Cornett, MD;  Location: [place];  Service: General;  Laterality: Left;  excision lipoma Left Lower abdomen  . 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 Tubal ligation Left ~ 1985  . Total knee arthroplasty Left 05/08/2013    Procedure: TOTAL KNEE ARTHROPLASTY;  Surgeon: Ninetta Lights, MD;  Location: [place];  Service: Orthopedics;  Laterality: Left;   Family History  Problem Relation Age of Onset  . Colon cancer Father   . Cancer Father     colon  . Heart disease Father   . Malignant hyperthermia Neg Hx   . Heart disease Paternal Uncle   . Diabetes Sister    History  Substance Use Topics  . Smoking status: Former Smoker -- 2.00 packs/day for 35 years    Types: Cigarettes    Quit date: 08/20/2013  . Smokeless tobacco: Never Used     Comment: Currently exposed to 2nd hand smoke in home   . Alcohol Use: No   OB History    No data available     Review of Systems  Constitutional: Negative for fever and chills.  Musculoskeletal:       See HPI.  Skin: Negative.  Negative for color change and wound.  Neurological: Negative.  Negative for numbness.  Allergies  Sulfa antibiotics; Amoxicillin; Azithromycin; Penicillins; Propoxyphene n-acetaminophen; and Clarithromycin  Home Medications   Prior to Admission medications   Medication Sig Start Date End Date Taking? Authorizing Provider  albuterol (PROVENTIL HFA;VENTOLIN HFA) 108 (90 BASE) MCG/ACT inhaler Inhale 2 puffs into the lungs every 6 (six) hours as needed for wheezing (wheezing). Shortness of breath 09/11/12  Yes Gregor Hams, MD  losartan (COZAAR) 50 MG tablet Take 50 mg by mouth daily.   Yes Historical Provider, MD  rosuvastatin (CRESTOR) 20 MG tablet Take 20 mg by mouth daily.   Yes Historical Provider, MD  Tiotropium Bromide-Olodaterol (STIOLTO RESPIMAT) 2.5-2.5 MCG/ACT AERS Inhale 2 puffs into the lungs daily.   Yes Historical Provider, MD   acetaminophen-codeine (TYLENOL #3) 300-30 MG per tablet Take 1-2 tablets by mouth every 4 (four) hours as needed. Patient not taking: Reported on 04/29/2015 02/25/15   Roselee Culver, MD  albuterol (PROVENTIL) (2.5 MG/3ML) 0.083% nebulizer solution Take 3 mLs (2.5 mg total) by nebulization every 6 (six) hours as needed for wheezing or shortness of breath. Patient not taking: Reported on 04/29/2015 10/04/14   Baron Sane, PA-C  guaiFENesin-codeine (ROBITUSSIN AC) 100-10 MG/5ML syrup Take 10 mLs by mouth 4 (four) times daily as needed for cough. Patient not taking: Reported on 04/29/2015 12/24/14   Billy Fischer, MD  hydrochlorothiazide (HYDRODIURIL) 12.5 MG tablet Take 1 tablet (12.5 mg total) by mouth daily. Patient not taking: Reported on 02/25/2015 09/04/14   April Palumbo, MD  HYDROcodone-acetaminophen (NORCO/VICODIN) 5-325 MG per tablet Take 1 tablet by mouth every 6 (six) hours as needed for moderate pain or severe pain. Patient not taking: Reported on 04/29/2015 01/14/15   Domenic Moras, PA-C  ipratropium (ATROVENT) 0.06 % nasal spray Place 2 sprays into both nostrils 4 (four) times daily. Patient not taking: Reported on 04/29/2015 12/24/14   Billy Fischer, MD  losartan (COZAAR) 50 MG tablet Take 1 tablet (50 mg total) by mouth daily. Patient not taking: Reported on 02/25/2015 04/17/13   Collene Gobble, MD  predniSONE (DELTASONE) 20 MG tablet Take 2 tablets (40 mg total) by mouth daily. Patient not taking: Reported on 04/29/2015 10/04/14   Baron Sane, PA-C  predniSONE (DELTASONE) 50 MG tablet Take 1 tablet (50 mg total) by mouth daily with breakfast. Patient not taking: Reported on 04/29/2015 12/09/14   Liam Graham, PA-C  topiramate (TOPAMAX) 50 MG tablet Take 50 mg by mouth 2 (two) times daily as needed (headache & migraine pain). For headache pain    Historical Provider, MD   BP 175/84 mmHg  Pulse 85  Temp(Src) 97.9 F (36.6 C) (Oral)  Resp 18  Ht 5' (1.524 m)  Wt 186 lb  (84.369 kg)  BMI 36.33 kg/m2  SpO2 98% Physical Exam  Constitutional: She is oriented to person, place, and time. She appears well-developed and well-nourished.  Neck: Normal range of motion.  Pulmonary/Chest: Effort normal.  Musculoskeletal:  Left foot swollen over dorsum without pitting. Non-tender. FROM all toes and ankle. No calf tenderness.   Neurological: She is alert and oriented to person, place, and time.  Skin: Skin is warm and dry.    ED Course  Procedures (including critical care time) Labs Review Labs Reviewed - No data to display  Imaging Review Dg Foot Complete Left  04/29/2015   CLINICAL DATA:  Bilateral foot swelling for 2 days, left worse than right. 2 mm drink fell ulna left foot 1 month ago. Sore since then.  EXAM: LEFT  FOOT - COMPLETE 3+ VIEW  COMPARISON:  02/25/2015  FINDINGS: Diffuse bone demineralization. Degenerative changes in the interphalangeal joints and first metatarsal-phalangeal joint. Soft tissue swelling over the forefoot. No evidence of acute fracture or dislocation. No focal bone lesion or bone destruction. Achilles and plantar spurs of the calcaneus. 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  IMPRESSION: Soft tissue swelling over the forefoot. No acute bony abnormalities. Degenerative changes.   Electronically Signed   By: Lucienne Capers M.D.   On: 04/29/2015 01:58     EKG Interpretation None      MDM   Final diagnoses:  None    1. Left foot swelling  Likely continued swelling due to being unable to rest or elevate the foot. Will refer to ortho, provide crutches, encourage rest and elevation.     Charlann Lange, PA-C 04/29/15 [id number]  Debby Freiberg, MD 05/01/15 [phone]

## 2015-06-12 ENCOUNTER — Emergency Department (HOSPITAL_COMMUNITY)
Admission: EM | Admit: 2015-06-12 | Discharge: 2015-06-12 | Disposition: A | Discharge status: Home / Self Care | Payer: Medicare Other | Attending: Emergency Medicine | Admitting: Emergency Medicine

## 2015-06-12 ENCOUNTER — Encounter (HOSPITAL_COMMUNITY): Payer: Self-pay | Admitting: Emergency Medicine

## 2015-06-12 ENCOUNTER — Emergency Department (HOSPITAL_COMMUNITY): Payer: Medicare Other

## 2015-06-12 DIAGNOSIS — R062 Wheezing: Secondary | ICD-10-CM | POA: Diagnosis not present

## 2015-06-12 DIAGNOSIS — E785 Hyperlipidemia, unspecified: Secondary | ICD-10-CM | POA: Diagnosis not present

## 2015-06-12 DIAGNOSIS — Z79899 Other long term (current) drug therapy: Secondary | ICD-10-CM | POA: Diagnosis not present

## 2015-06-12 DIAGNOSIS — G43909 Migraine, unspecified, not intractable, without status migrainosus: Secondary | ICD-10-CM | POA: Insufficient documentation

## 2015-06-12 DIAGNOSIS — J441 Chronic obstructive pulmonary disease with (acute) exacerbation: Secondary | ICD-10-CM | POA: Diagnosis not present

## 2015-06-12 DIAGNOSIS — M170 Bilateral primary osteoarthritis of knee: Secondary | ICD-10-CM | POA: Diagnosis not present

## 2015-06-12 DIAGNOSIS — H9202 Otalgia, left ear: Secondary | ICD-10-CM | POA: Diagnosis not present

## 2015-06-12 DIAGNOSIS — J029 Acute pharyngitis, unspecified: Secondary | ICD-10-CM | POA: Insufficient documentation

## 2015-06-12 DIAGNOSIS — R05 Cough: Secondary | ICD-10-CM | POA: Diagnosis not present

## 2015-06-12 DIAGNOSIS — Z8719 Personal history of other diseases of the digestive system: Secondary | ICD-10-CM | POA: Diagnosis not present

## 2015-06-12 DIAGNOSIS — I10 Essential (primary) hypertension: Secondary | ICD-10-CM | POA: Insufficient documentation

## 2015-06-12 DIAGNOSIS — R079 Chest pain, unspecified: Secondary | ICD-10-CM | POA: Diagnosis present

## 2015-06-12 DIAGNOSIS — R1110 Vomiting, unspecified: Secondary | ICD-10-CM | POA: Diagnosis not present

## 2015-06-12 DIAGNOSIS — Z880 Allergy status to penicillin: Secondary | ICD-10-CM | POA: Diagnosis not present

## 2015-06-12 DIAGNOSIS — Z7952 Long term (current) use of systemic steroids: Secondary | ICD-10-CM | POA: Insufficient documentation

## 2015-06-12 DIAGNOSIS — Z87891 Personal history of nicotine dependence: Secondary | ICD-10-CM | POA: Insufficient documentation

## 2015-06-12 LAB — CBC WITH DIFFERENTIAL/PLATELET
Basophils Absolute: 0.1 10*3/uL (ref 0.0–0.1)
Basophils Relative: 1 % (ref 0–1)
Eosinophils Absolute: 0.3 10*3/uL (ref 0.0–0.7)
Eosinophils Relative: 3 % (ref 0–5)
HCT: 38.8 % (ref 36.0–46.0)
Hemoglobin: 12.6 g/dL (ref 12.0–15.0)
Lymphocytes Relative: 36 % (ref 12–46)
Lymphs Abs: 3.6 10*3/uL (ref 0.7–4.0)
MCH: 27.6 pg (ref 26.0–34.0)
MCHC: 32.5 g/dL (ref 30.0–36.0)
MCV: 85.1 fL (ref 78.0–100.0)
Monocytes Absolute: 0.7 10*3/uL (ref 0.1–1.0)
Monocytes Relative: 7 % (ref 3–12)
Neutro Abs: 5.5 10*3/uL (ref 1.7–7.7)
Neutrophils Relative %: 53 % (ref 43–77)
Platelets: 248 10*3/uL (ref 150–400)
RBC: 4.56 MIL/uL (ref 3.87–5.11)
RDW: 13.7 % (ref 11.5–15.5)
WBC: 10.1 10*3/uL (ref 4.0–10.5)

## 2015-06-12 LAB — BASIC METABOLIC PANEL
Anion gap: 7 (ref 5–15)
BUN: 26 mg/dL — ABNORMAL HIGH (ref 6–20)
CO2: 29 mmol/L (ref 22–32)
Calcium: 8.9 mg/dL (ref 8.9–10.3)
Chloride: 101 mmol/L (ref 101–111)
Creatinine, Ser: 0.77 mg/dL (ref 0.44–1.00)
GFR calc Af Amer: >60 mL/min (ref >60)
GFR calc non Af Amer: >60 mL/min (ref >60)
Glucose, Bld: 86 mg/dL (ref 65–99)
Potassium: 3.8 mmol/L (ref 3.5–5.1)
Sodium: 137 mmol/L (ref 135–145)

## 2015-06-12 LAB — I-STAT TROPONIN, ED
Troponin i, poc: 0 ng/mL (ref 0.00–0.08)
Troponin i, poc: 0 ng/mL (ref 0.00–0.08)

## 2015-06-12 MED ORDER — PANTOPRAZOLE SODIUM 20 MG PO TBEC: 20.0000 mg | DELAYED_RELEASE_TABLET | Freq: Every day | ORAL | Status: DC

## 2015-06-12 MED ORDER — FAMOTIDINE 20 MG PO TABS
20.0000 mg | ORAL_TABLET | Freq: Once | ORAL | Status: DC
  Filled 2015-06-12: qty 1

## 2015-06-12 MED ORDER — GI COCKTAIL ~~LOC~~
30.0000 mL | Freq: Once | ORAL | Status: DC
  Filled 2015-06-12: qty 30

## 2015-06-12 MED ORDER — ONDANSETRON 4 MG PO TBDP
4.0000 mg | ORAL_TABLET | Freq: Once | ORAL | Status: AC
  Administered 2015-06-12: 4 mg via ORAL
  Filled 2015-06-12: qty 1

## 2015-06-12 MED ORDER — PANTOPRAZOLE SODIUM 40 MG PO TBEC
40.0000 mg | DELAYED_RELEASE_TABLET | Freq: Once | ORAL | Status: DC
  Filled 2015-06-12: qty 1

## 2015-06-12 MED ORDER — ASPIRIN 325 MG PO TABS
325.0000 mg | ORAL_TABLET | Freq: Once | ORAL | Status: AC
  Administered 2015-06-12: 325 mg via ORAL
  Filled 2015-06-12: qty 1

## 2015-06-12 NOTE — ED Provider Notes (Signed)
CSN: [id number]     Arrival date & time 06/12/15  1800 History  This chart was scribed for non-physician practitioner, Noland Fordyce, PA-C, working with Evelina Bucy, MD by [name], ED Scribe. This patient was seen in room WTR8/WTR8 and the patient's care was started at 6:33 PM.    Chief Complaint  Patient presents with  . Sore Throat    The history is provided by the patient. No language interpreter was used.   Summer Jackson is a 69 y.o. female  With PMHx of GERD, COPD, asthma, and HTN who presents to the Emergency Department complaining of constant sore throat with onset 2 days ago. Pt states that it feels as if  "something" is "stuck" in her throat and rates the pain 10/10 in severity. Associated symptoms include subjective throat swelling, throat redness, non-productive cough, left ear pain, vomiting that she associates with acid reflux, and left-sided chest pain that is described as sore.  Pt denies fever, SOB, choking, hematemesis, abdominal pain, and known sick contact. She took nothing for pain PTA and denies increased inhaler use. Reports hx of COPD but denies hx of CAD. Pt states she has seen her PCP recently who is scheduling her for an endoscopy for July 5th but states she is not sure she can wait that long due to the pain. She has not been prescribed anything for her acid reflux.  Past Medical History  Diagnosis Date  . Hypertension   . Hyperlipemia   . GERD (gastroesophageal reflux disease)   . Rectal bleeding   . Blood transfusion ~ 1955    "before heart OR" (05/09/2013)  . COPD (chronic obstructive pulmonary disease)   . Weight loss   . Nasal congestion   . Wheezing   . Asthma   . Hemorrhoids   . Urinary frequency   . Hypertension   . Exertional shortness of breath   . History of stomach ulcers 1990's  . Migraines   . Arthritis     "knees" (05/09/2013)   Past Surgical History  Procedure Laterality Date  . Heart surgery at 44yr  1955    "blood was going  thru my lungs but not thru my heart like it should" (05/09/2013)  . Appendectomy  1970's  . Colonoscopy    . Left oophorectomy Left ~ 1985  . Colonoscopy  12/22/2011    Procedure: COLONOSCOPY;  Surgeon: Lear Ng, MD;  Location: [facility];  Service: Endoscopy;  Laterality: N/A;  . Tumor excision Left ~ 1984    "under breast" (05/09/2013)  . Knee arthroscopy Left ~ 2004; ~ 2006  . Lipoma excision  02/15/2012    Procedure: EXCISION LIPOMA;  Surgeon: Joyice Faster. Cornett, MD;  Location: [place];  Service: General;  Laterality: Left;  excision lipoma Left Lower abdomen  . 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 Tubal ligation Left ~ 1985  . 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 Colon cancer Father   . Cancer Father     colon  . Heart disease Father   . Malignant hyperthermia Neg Hx   . Heart disease Paternal Uncle   . Diabetes Sister    History  Substance Use Topics  . Smoking status: Former Smoker -- 2.00 packs/day for 35 years    Types:  Cigarettes    Quit date: 08/20/2013  . Smokeless tobacco: Never Used     Comment: Currently exposed to 2nd hand smoke in home   . Alcohol Use: No   OB History    No data available     Review of Systems  HENT: Positive for ear pain (left) and sore throat.        Throat swelling Throat redness  Respiratory: Positive for cough and wheezing. Negative for choking and shortness of breath.   Cardiovascular: Positive for chest pain.  Gastrointestinal: Positive for vomiting. Negative for nausea.      Allergies  Sulfa antibiotics; Amoxicillin; Azithromycin; Penicillins; Propoxyphene n-acetaminophen; and Clarithromycin  Home Medications   Prior to Admission medications   Medication Sig Start Date End Date Taking? Authorizing Provider   acetaminophen-codeine (TYLENOL #3) 300-30 MG per tablet Take 1-2 tablets by mouth every 4 (four) hours as needed. Patient not taking: Reported on 04/29/2015 02/25/15   Roselee Culver, MD  albuterol (PROVENTIL HFA;VENTOLIN HFA) 108 (90 BASE) MCG/ACT inhaler Inhale 2 puffs into the lungs every 6 (six) hours as needed for wheezing (wheezing). Shortness of breath 09/11/12   Gregor Hams, MD  albuterol (PROVENTIL) (2.5 MG/3ML) 0.083% nebulizer solution Take 3 mLs (2.5 mg total) by nebulization every 6 (six) hours as needed for wheezing or shortness of breath. Patient not taking: Reported on 04/29/2015 10/04/14   Baron Sane, PA-C  guaiFENesin-codeine (ROBITUSSIN AC) 100-10 MG/5ML syrup Take 10 mLs by mouth 4 (four) times daily as needed for cough. Patient not taking: Reported on 04/29/2015 12/24/14   Billy Fischer, MD  hydrochlorothiazide (HYDRODIURIL) 12.5 MG tablet Take 1 tablet (12.5 mg total) by mouth daily. Patient not taking: Reported on 02/25/2015 09/04/14   April Palumbo, MD  HYDROcodone-acetaminophen (NORCO/VICODIN) 5-325 MG per tablet Take 1-2 tablets by mouth every 4 (four) hours as needed. 04/29/15   Charlann Lange, PA-C  ibuprofen (ADVIL,MOTRIN) 600 MG tablet Take 1 tablet (600 mg total) by mouth every 6 (six) hours as needed. 04/29/15   Charlann Lange, PA-C  ipratropium (ATROVENT) 0.06 % nasal spray Place 2 sprays into both nostrils 4 (four) times daily. Patient not taking: Reported on 04/29/2015 12/24/14   Billy Fischer, MD  losartan (COZAAR) 50 MG tablet Take 1 tablet (50 mg total) by mouth daily. Patient not taking: Reported on 02/25/2015 04/17/13   Collene Gobble, MD  losartan (COZAAR) 50 MG tablet Take 50 mg by mouth daily.    Historical Provider, MD  predniSONE (DELTASONE) 20 MG tablet Take 2 tablets (40 mg total) by mouth daily. Patient not taking: Reported on 04/29/2015 10/04/14   Baron Sane, PA-C  predniSONE (DELTASONE) 50 MG tablet Take 1 tablet (50 mg total) by mouth daily  with breakfast. Patient not taking: Reported on 04/29/2015 12/09/14   Liam Graham, PA-C  rosuvastatin (CRESTOR) 20 MG tablet Take 20 mg by mouth daily.    Historical Provider, MD  Tiotropium Bromide-Olodaterol (STIOLTO RESPIMAT) 2.5-2.5 MCG/ACT AERS Inhale 2 puffs into the lungs daily.    Historical Provider, MD  topiramate (TOPAMAX) 50 MG tablet Take 50 mg by mouth 2 (two) times daily as needed (headache & migraine pain). For headache pain    Historical Provider, MD   BP 147/69 mmHg  Pulse 73  Temp(Src) 98.1 F (36.7 C) (Oral)  Resp 18  SpO2 100% on RA Physical Exam  Constitutional: She is oriented to person, place, and time. She appears well-developed and well-nourished. No distress.  HENT:  Head: Normocephalic and atraumatic.  Right Ear: Hearing, external ear and ear canal normal. Tympanic membrane is erythematous.  Left Ear: Hearing, tympanic membrane, external ear and ear canal normal.  Nose: Mucosal edema present. Right sinus exhibits no maxillary sinus tenderness and no frontal sinus tenderness. Left sinus exhibits no maxillary sinus tenderness and no frontal sinus tenderness.  Mouth/Throat: Uvula is midline and mucous membranes are normal. Posterior oropharyngeal erythema present.  Eyes: Conjunctivae and EOM are normal.  Neck: Neck supple. No tracheal deviation present.  Cardiovascular: Normal rate and regular rhythm.   Pulmonary/Chest: Effort normal. No stridor. No respiratory distress. She has no wheezes. She has no rales. She exhibits tenderness (left anterior).  Abdominal: Soft. There is no tenderness.  Musculoskeletal: Normal range of motion.  Neurological: She is alert and oriented to person, place, and time.  Skin: Skin is warm and dry.  Psychiatric: She has a normal mood and affect. Her behavior is normal.  Nursing note and vitals reviewed.   ED Course  Procedures (including critical care time)  Coordination of Care 6:45 PM Discussed treatment plan which  includes XR with pt at bedside and pt agreed to plan.  Labs Review Labs Reviewed - No data to display  Imaging Review No results found.   EKG Interpretation None      MDM   Final diagnoses:  None    Pt is a 69yo female presenting to ED with c/o sore throat for 2 days, non-productive cough and left ear pain.  Pt feels symptoms are due to her acid reflux.  Plain films of soft tissue neck and chest performed to ensure no airway swelling. Plan was to tx for GERD with GI cocktail, pepcid and protonix but by the time RN retrieved medications and plain films resulted, pt vomited several times.   Given c/o Left sided chest pain along with nausea and vomiting and pt's age, concern for atypical ACS presentation.  Will start cardiac workup with EKG, troponin and basic labs- CBC and BMP.   Discussed pt with Dr. Mingo Amber who will resume care of pt and f/u on cardiac workup. Pt moved to higher acuity room.   I personally performed the services described in this documentation, which was scribed in my presence. The recorded information has been reviewed and is accurate.    Noland Fordyce, PA-C 06/12/15 2012  Evelina Bucy, MD 06/13/15 [phone]

## 2015-06-12 NOTE — ED Notes (Signed)
Per pt, states sore throat since yesterday-states today she feels like something in stuck in throat

## 2015-06-12 NOTE — ED Notes (Signed)
Bed: PT47 Expected date:  Expected time:  Means of arrival:  Comments: Hold for T8

## 2015-06-12 NOTE — Discharge Instructions (Signed)

## 2015-06-12 NOTE — ED Notes (Signed)
Pt is unable at this time d/t N/V

## 2015-08-18 ENCOUNTER — Other Ambulatory Visit: Payer: Self-pay | Admitting: Obstetrics & Gynecology

## 2015-08-18 DIAGNOSIS — R928 Other abnormal and inconclusive findings on diagnostic imaging of breast: Secondary | ICD-10-CM

## 2015-08-21 ENCOUNTER — Ambulatory Visit
Admission: RE | Admit: 2015-08-21 | Discharge: 2015-08-21 | Disposition: A | Discharge status: Home / Self Care | Payer: Medicare Other | Source: Ambulatory Visit | Attending: Obstetrics & Gynecology | Admitting: Obstetrics & Gynecology

## 2015-08-21 DIAGNOSIS — R928 Other abnormal and inconclusive findings on diagnostic imaging of breast: Secondary | ICD-10-CM

## 2015-09-08 IMAGING — CT CT HEAD W/O CM
2 series · 16 of 30 positions shown, 20 images · non-contrast
Comparison: October 23, 2010

CLINICAL DATA: Migraine-type headache with photophobia and nausea

EXAM:
CT HEAD WITHOUT CONTRAST
TECHNIQUE: Contiguous axial images were obtained from the base of the skull
through the vertex without intravenous contrast.

[Series 2: head w/o · axial · non-contrast · 0.45mm/px · z∈[+1423,+1543]mm · 13 of 29 slices shown, 17 images]
[im 3/29  brain]
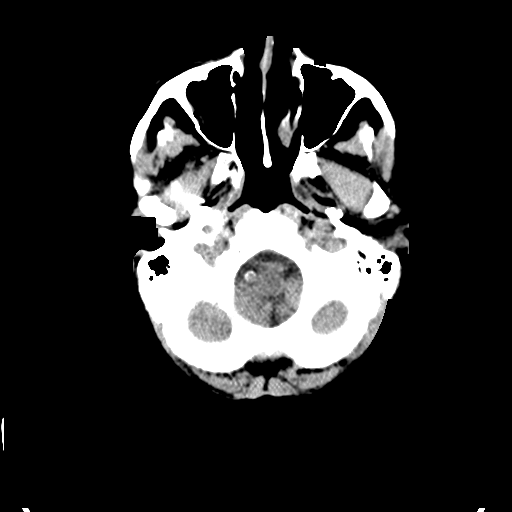
[im 3/29  bone]
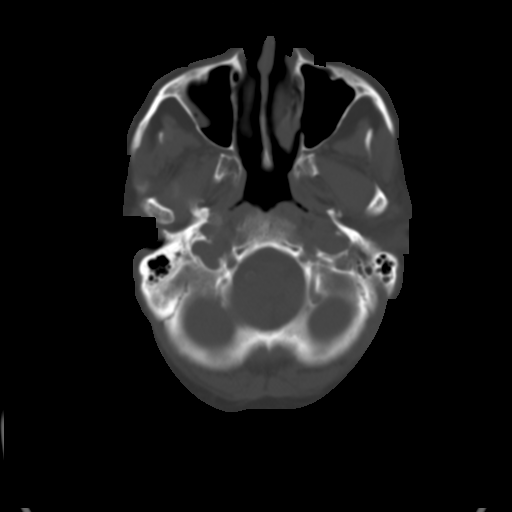
[im 5/29  brain]
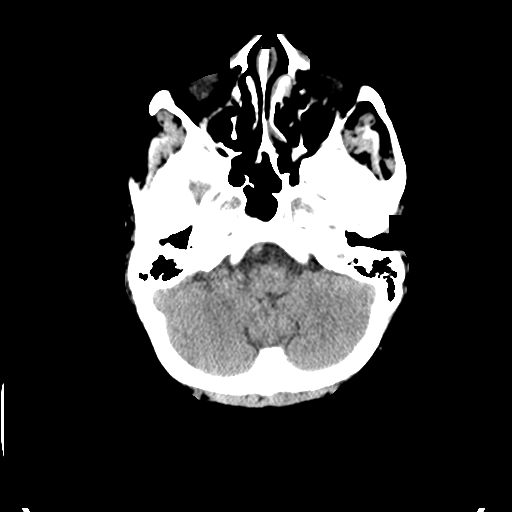
[im 7/29  brain]
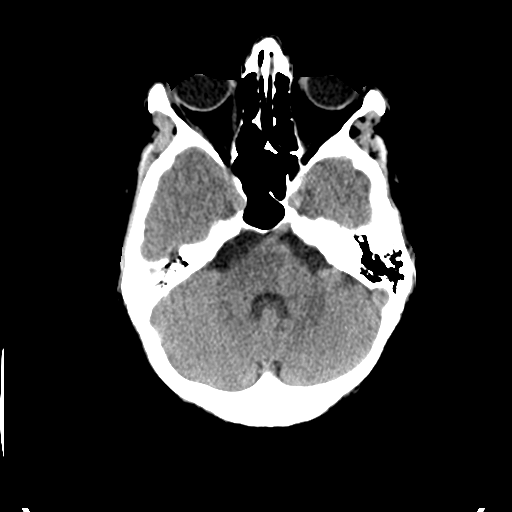
[im 9/29  brain]
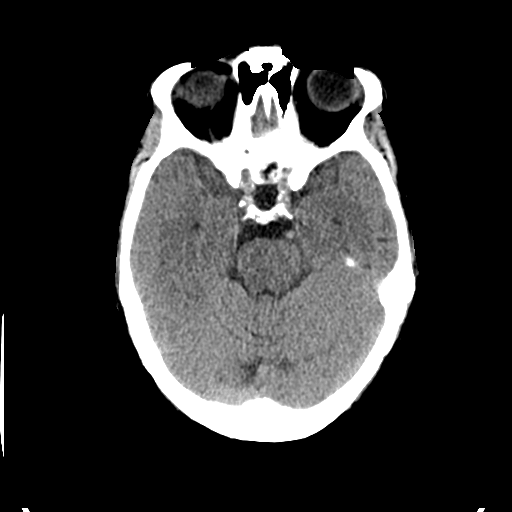
[im 11/29  brain]
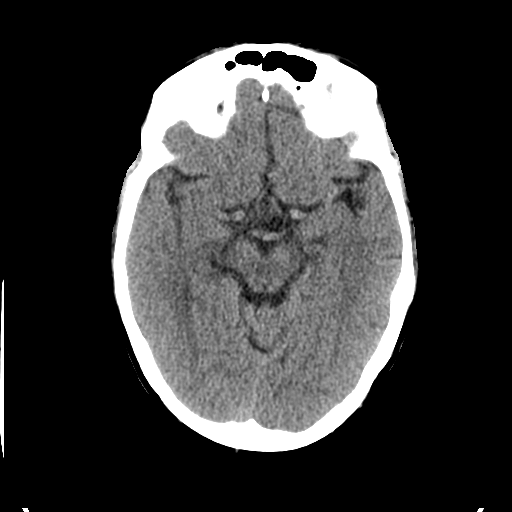
[im 11/29  bone]
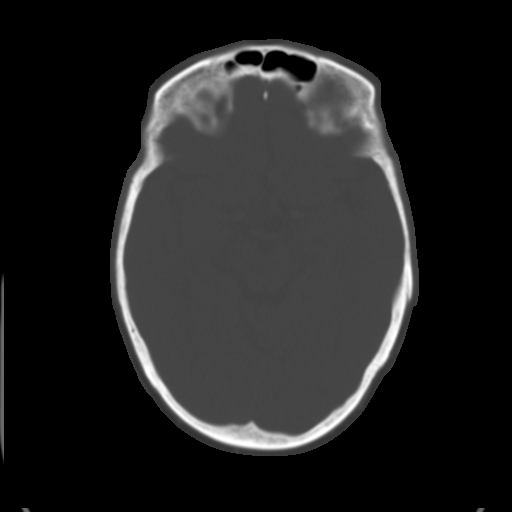
[im 13/29  brain]
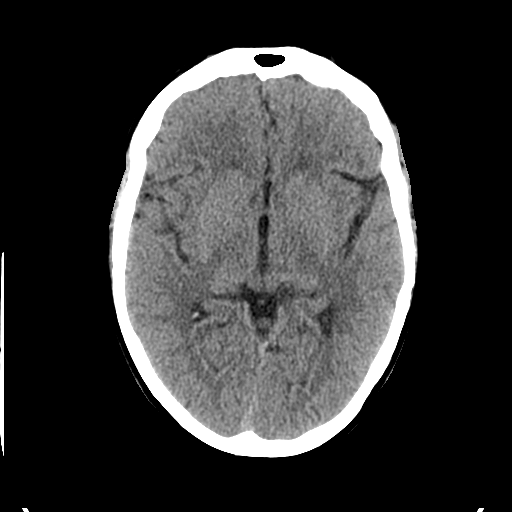
[im 15/29  brain]
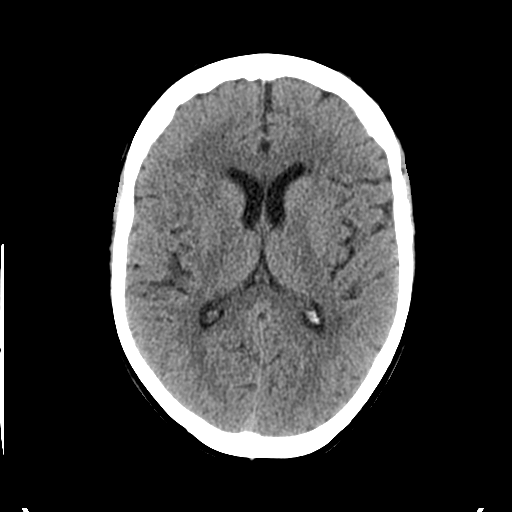
[im 17/29  brain]
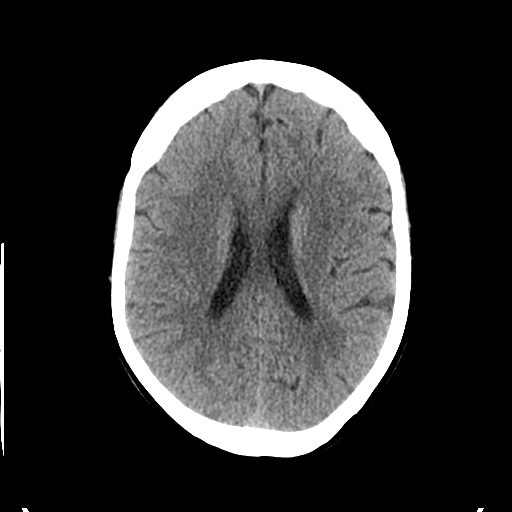
[im 19/29  brain]
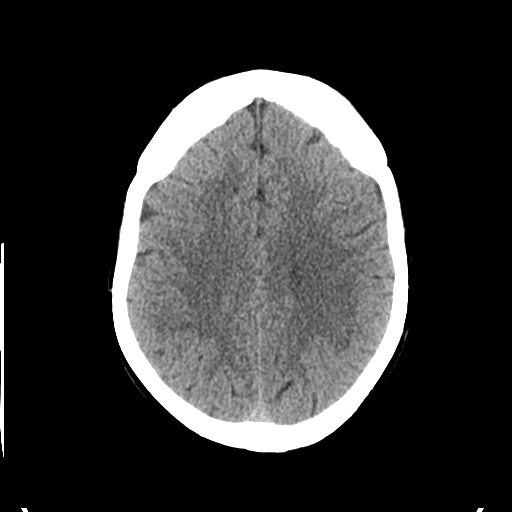
[im 19/29  bone]
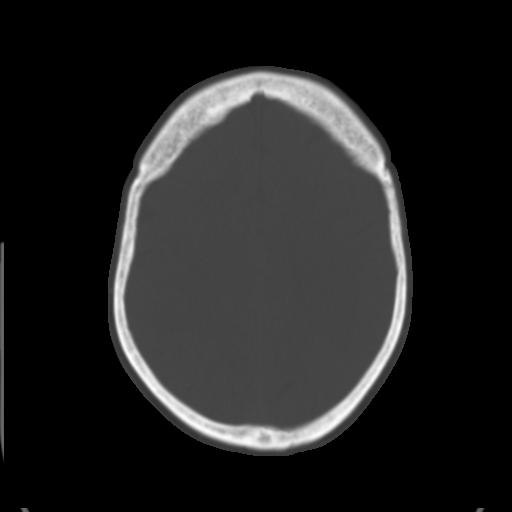
[im 21/29  brain]
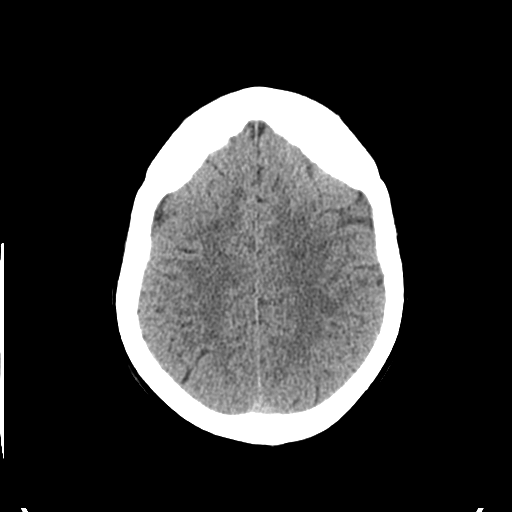
[im 23/29  brain]
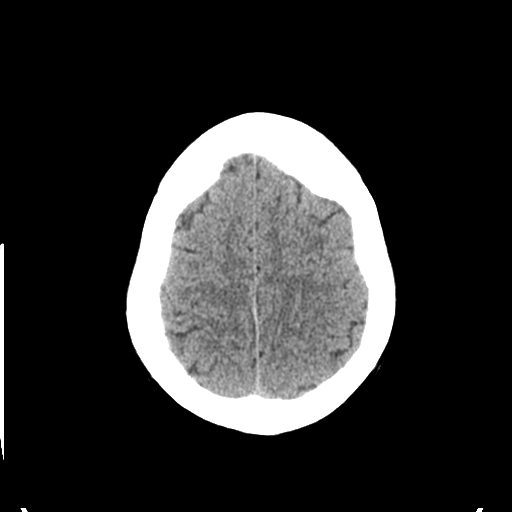
[im 25/29  brain]
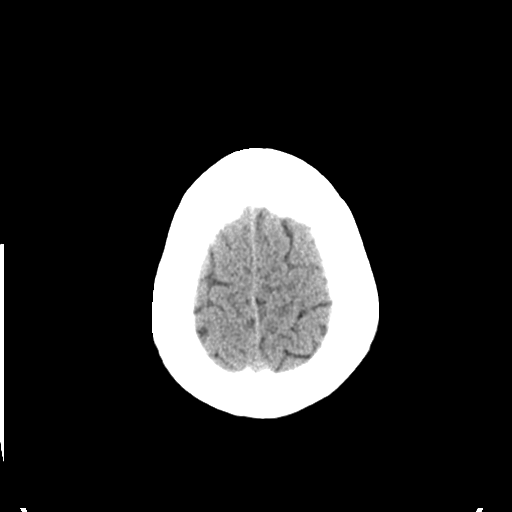
[im 27/29  brain]
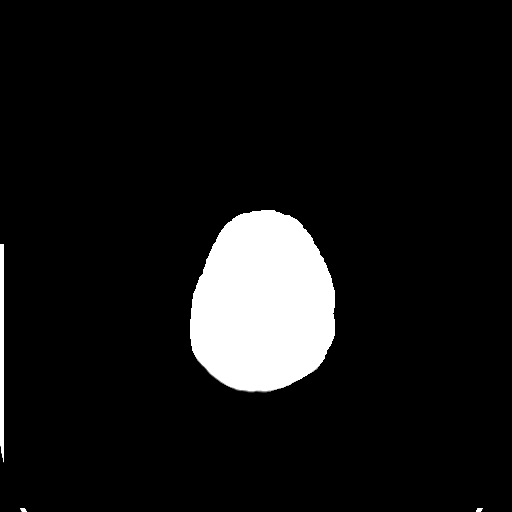
[im 27/29  bone]
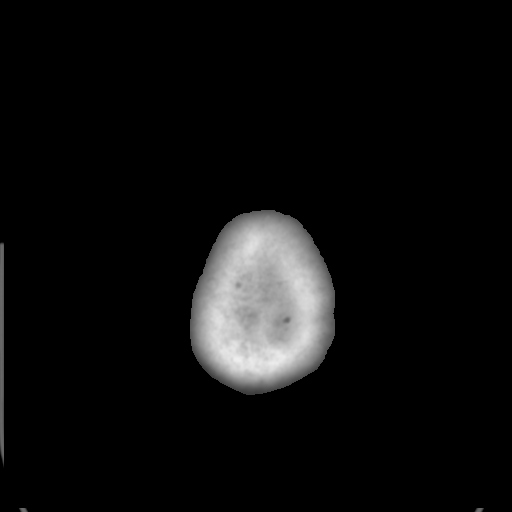

[Series 3: bone windows · axial · 0.45mm/px · z∈[+1423,+1463]mm · 3 of 29 slices shown]
[im 3/29  bone]
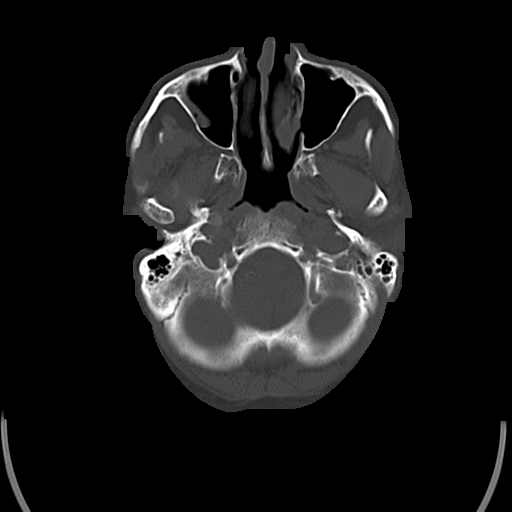
[im 7/29  bone]
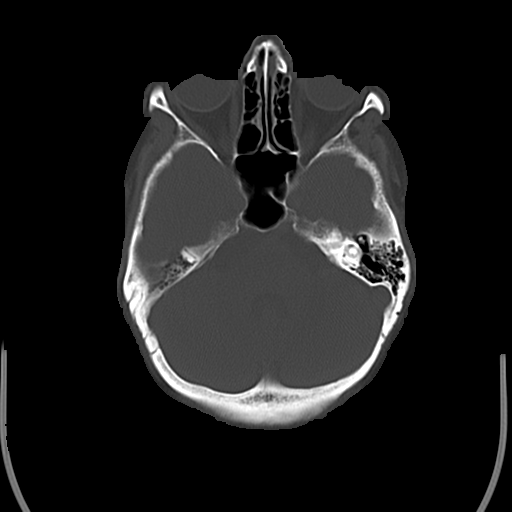
[im 11/29  bone]
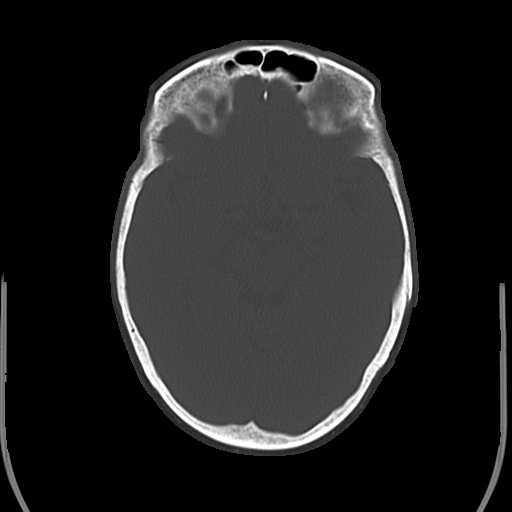

[16 of 30 positions shown; findings below may reference images not displayed]

FINDINGS: The ventricles are normal in size and configuration. There is
evidence of a degree of empty sella. There is no mass, hemorrhage,
extra-axial fluid collection, or midline shift. There is mild small
vessel disease in the corona radiata anteriorly bilaterally. There
is no new gray-white compartment lesion. No apparent acute infarct.
Bony calvarium appears intact. The mastoid air cells are clear.
There is mucosal thickening in the lateral right maxillary antrum.
IMPRESSION: Mild periventricular small vessel disease, stable. No intracranial
mass, hemorrhage, or acute appearing infarct. Mild empty sella. Mild
right maxillary sinus disease.

## 2015-10-12 ENCOUNTER — Ambulatory Visit: Payer: Medicare Other | Admitting: Gastroenterology

## 2015-10-14 ENCOUNTER — Telehealth: Payer: Self-pay | Admitting: Family Medicine

## 2015-10-14 NOTE — Telephone Encounter (Signed)
Spoke with patient and she is coming in on 10/27/15 for a CPE with Tor Netters and to close gaps for [facility].

## 2015-10-25 ENCOUNTER — Ambulatory Visit (INDEPENDENT_AMBULATORY_CARE_PROVIDER_SITE_OTHER): Payer: Medicare Other | Admitting: Family Medicine

## 2015-10-25 VITALS — BP 118/80 | HR 81 | Temp 98.4°F | Resp 18 | Ht 62.0 in | Wt 189.4 lb

## 2015-10-25 DIAGNOSIS — B029 Zoster without complications: Secondary | ICD-10-CM | POA: Diagnosis not present

## 2015-10-25 MED ORDER — VALACYCLOVIR HCL 1 G PO TABS: 1000.0000 mg | ORAL_TABLET | Freq: Three times a day (TID) | ORAL | Status: DC

## 2015-10-25 MED ORDER — HYDROXYZINE HCL 10 MG PO TABS: 10.0000 mg | ORAL_TABLET | Freq: Three times a day (TID) | ORAL | Status: DC | PRN

## 2015-10-25 NOTE — Patient Instructions (Signed)

## 2015-10-25 NOTE — Progress Notes (Signed)
@UMFCLOGO @  This chart was scribed for Robyn Haber, MD by Thea Alken, ED Scribe. This patient was seen in room 14 and the patient's care was started at 3:39 PM.  Patient ID: Summer Jackson MRN: [id number], DOB: Aug 12, 1946, 69 y.o. Date of Encounter: 10/25/2015, 3:38 PM  Primary Physician: Elyn Peers, MD  Chief Complaint:  Chief Complaint  Patient presents with   OTHER    itching, redness right abdominal side and right leg     HPI: 69 y.o. year old female with history below presents with an erythematous rash along posterior right hip, right buttock, and right leg that began 2 days ago. Pt was seen by Dr. Nancy Fetter 1 week ago for itching to the same area and was prescribed a cream. The actual rash did not appear until 2 days ago. States rash is beginning to itch and burn.    Past Medical History  Diagnosis Date   Hypertension    Hyperlipemia    GERD (gastroesophageal reflux disease)    Rectal bleeding    Blood transfusion ~ 1955    "before heart OR" (05/09/2013)   COPD (chronic obstructive pulmonary disease) (HCC)    Weight loss    Nasal congestion    Wheezing    Asthma    Hemorrhoids    Urinary frequency    Hypertension    Exertional shortness of breath    History of stomach ulcers 1990's   Migraines    Arthritis     "knees" (05/09/2013)     Home Meds: Prior to Admission medications   Medication Sig Start Date End Date Taking? Authorizing Provider  acetaminophen-codeine (TYLENOL #3) 300-30 MG per tablet Take 1-2 tablets by mouth every 4 (four) hours as needed. 02/25/15  Yes Roselee Culver, MD  albuterol (PROVENTIL) (2.5 MG/3ML) 0.083% nebulizer solution Take 3 mLs (2.5 mg total) by nebulization every 6 (six) hours as needed for wheezing or shortness of breath. 10/04/14  Yes Jennifer Piepenbrink, PA-C  guaiFENesin-codeine (ROBITUSSIN AC) 100-10 MG/5ML syrup Take 10 mLs by mouth 4 (four) times daily as needed for cough. 12/24/14  Yes Billy Fischer, MD   hydrochlorothiazide (HYDRODIURIL) 12.5 MG tablet Take 1 tablet (12.5 mg total) by mouth daily. 09/04/14  Yes April Palumbo, MD  HYDROcodone-acetaminophen (NORCO/VICODIN) 5-325 MG per tablet Take 1-2 tablets by mouth every 4 (four) hours as needed. 04/29/15  Yes Shari Upstill, PA-C  ibuprofen (ADVIL,MOTRIN) 600 MG tablet Take 1 tablet (600 mg total) by mouth every 6 (six) hours as needed. 04/29/15  Yes Shari Upstill, PA-C  ipratropium (ATROVENT) 0.06 % nasal spray Place 2 sprays into both nostrils 4 (four) times daily. 12/24/14  Yes Billy Fischer, MD  pantoprazole (PROTONIX) 20 MG tablet Take 1 tablet (20 mg total) by mouth daily. 06/12/15  Yes Evelina Bucy, MD  predniSONE (DELTASONE) 20 MG tablet Take 2 tablets (40 mg total) by mouth daily. 10/04/14  Yes Jennifer Piepenbrink, PA-C  predniSONE (DELTASONE) 50 MG tablet Take 1 tablet (50 mg total) by mouth daily with breakfast. 12/09/14  Yes Liam Graham, PA-C  Tiotropium Bromide-Olodaterol (STIOLTO RESPIMAT) 2.5-2.5 MCG/ACT AERS Inhale 2 puffs into the lungs daily.   Yes Historical Provider, MD  albuterol (PROVENTIL HFA;VENTOLIN HFA) 108 (90 BASE) MCG/ACT inhaler Inhale 2 puffs into the lungs every 6 (six) hours as needed for wheezing (wheezing). Shortness of breath 09/11/12   Gregor Hams, MD  losartan (COZAAR) 50 MG tablet Take 1 tablet (50 mg total) by mouth daily. Patient  not taking: Reported on 10/25/2015 04/17/13   Collene Gobble, MD    Allergies:  Allergies  Allergen Reactions   Sulfa Antibiotics Swelling and Rash   Amoxicillin Other (See Comments)    REACTION: headache   Azithromycin Other (See Comments)    REACTION: reddness around mouth   Penicillins Other (See Comments)    REACTION: headache   Propoxyphene N-Acetaminophen Other (See Comments)    REACTION: mouth sores   Clarithromycin Rash    REACTION: skin rash with minimal vesicular formation on arms and hands    Social History   Social History   Marital Status: Divorced     Spouse Name: N/A   Number of Children: 2   Years of Education: N/A   Occupational History   Engineer, 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     Review of Systems: Constitutional: negative for chills, fever, night sweats, weight changes, or fatigue  HEENT: negative for vision changes, hearing loss, congestion, rhinorrhea, ST, epistaxis, or sinus pressure Cardiovascular: negative for chest pain or palpitations Respiratory: negative for hemoptysis, wheezing, shortness of breath, or cough Abdominal: negative for abdominal pain, nausea, vomiting, diarrhea, or constipation Dermatological: Positive for rash.  Neurologic: negative for headache, dizziness, or syncope All other systems reviewed and are otherwise negative with the exception to those above and in the HPI.   Physical Exam: Blood pressure 118/80, pulse 81, temperature 98.4 F (36.9 C), temperature source Oral, resp. rate 18, height 5\' 2"  (1.575 m), weight 189 lb 6.4 oz (85.911 kg), SpO2 96 %., Body mass index is 34.63 kg/(m^2). General: Well developed, well nourished, in no acute distress. Head: Normocephalic, atraumatic, eyes without discharge, sclera non-icteric, nares are without discharge. Bilateral auditory canals clear, TM's are without perforation, pearly grey and translucent with reflective cone of light bilaterally. Oral cavity moist, posterior pharynx without exudate, erythema, peritonsillar abscess, or post nasal drip.  Neck: Supple. No thyromegaly. Full ROM. No lymphadenopathy. Lungs: Clear  bilaterally to auscultation without wheezes, rales, or rhonchi. Breathing is unlabored. Heart: RRR with S1 S2. No murmurs, rubs, or gallops appreciated. Abdomen: Soft, non-tender, non-distended with normoactive bowel sounds. No hepatomegaly. No rebound/guarding. No obvious abdominal masses. Msk:  Strength and tone normal for age. Extremities/Skin: Warm and dry. No clubbing or cyanosis. No edema. No rashes or suspicious lesions. Vesicular dermatologic rash on right hip that goes down right leg Neuro: Alert and oriented X 3. Moves all extremities spontaneously. Gait is normal. CNII-XII grossly in tact. Psych:  Responds to questions appropriately with a normal affect.     ASSESSMENT AND PLAN:  69 y.o. year old female with dermatomal right side and hip rash c/w shingles This chart was scribed in my presence and reviewed by me personally.    ICD-9-CM ICD-10-CM   1. Shingles 053.9 B02.9 hydrOXYzine (ATARAX/VISTARIL) 10 MG tablet     Signed, Robyn Haber, MD   By signing my name below, I, Raven Small, attest that  this documentation has been prepared under the direction and in the presence of Robyn Haber, MD.  Electronically Signed: Thea Alken, ED Scribe. 10/25/2015. 3:46 PM.  Signed, Robyn Haber, MD 10/25/2015 3:38 PM

## 2015-10-27 ENCOUNTER — Encounter: Payer: Self-pay | Admitting: Family Medicine

## 2015-11-06 ENCOUNTER — Emergency Department (HOSPITAL_COMMUNITY): Payer: Medicare Other

## 2015-11-06 ENCOUNTER — Emergency Department (HOSPITAL_COMMUNITY)
Admission: EM | Admit: 2015-11-06 | Discharge: 2015-11-06 | Disposition: A | Discharge status: Home / Self Care | Payer: Medicare Other | Attending: Emergency Medicine | Admitting: Emergency Medicine

## 2015-11-06 ENCOUNTER — Encounter (HOSPITAL_COMMUNITY): Payer: Self-pay | Admitting: *Deleted

## 2015-11-06 DIAGNOSIS — J449 Chronic obstructive pulmonary disease, unspecified: Secondary | ICD-10-CM | POA: Insufficient documentation

## 2015-11-06 DIAGNOSIS — Y9289 Other specified places as the place of occurrence of the external cause: Secondary | ICD-10-CM | POA: Insufficient documentation

## 2015-11-06 DIAGNOSIS — K219 Gastro-esophageal reflux disease without esophagitis: Secondary | ICD-10-CM | POA: Diagnosis not present

## 2015-11-06 DIAGNOSIS — M170 Bilateral primary osteoarthritis of knee: Secondary | ICD-10-CM | POA: Insufficient documentation

## 2015-11-06 DIAGNOSIS — X58XXXA Exposure to other specified factors, initial encounter: Secondary | ICD-10-CM | POA: Insufficient documentation

## 2015-11-06 DIAGNOSIS — Z79899 Other long term (current) drug therapy: Secondary | ICD-10-CM | POA: Insufficient documentation

## 2015-11-06 DIAGNOSIS — I10 Essential (primary) hypertension: Secondary | ICD-10-CM | POA: Insufficient documentation

## 2015-11-06 DIAGNOSIS — S0083XA Contusion of other part of head, initial encounter: Secondary | ICD-10-CM | POA: Insufficient documentation

## 2015-11-06 DIAGNOSIS — Z7952 Long term (current) use of systemic steroids: Secondary | ICD-10-CM | POA: Diagnosis not present

## 2015-11-06 DIAGNOSIS — E785 Hyperlipidemia, unspecified: Secondary | ICD-10-CM | POA: Diagnosis not present

## 2015-11-06 DIAGNOSIS — Y998 Other external cause status: Secondary | ICD-10-CM | POA: Insufficient documentation

## 2015-11-06 DIAGNOSIS — Y9389 Activity, other specified: Secondary | ICD-10-CM | POA: Diagnosis not present

## 2015-11-06 DIAGNOSIS — Z87891 Personal history of nicotine dependence: Secondary | ICD-10-CM | POA: Diagnosis not present

## 2015-11-06 DIAGNOSIS — R220 Localized swelling, mass and lump, head: Secondary | ICD-10-CM | POA: Diagnosis present

## 2015-11-06 DIAGNOSIS — Z880 Allergy status to penicillin: Secondary | ICD-10-CM | POA: Diagnosis not present

## 2015-11-06 DIAGNOSIS — S0093XA Contusion of unspecified part of head, initial encounter: Secondary | ICD-10-CM

## 2015-11-06 NOTE — ED Provider Notes (Signed)
CSN: [id number]     Arrival date & time 11/06/15  1526 History  By signing my name below, I, Erling Conte, attest that this documentation has been prepared under the direction and in the presence of Alyse Low, PA-C Electronically Signed: Erling Conte, ED Scribe. 11/06/2015. 4:53 PM.    Chief Complaint  Patient presents with  . knot on head    The history is provided by the patient. No language interpreter was used.   HPI Comments: Summer Jackson is a 69 y.o. female who presents to the Emergency Department complaining of a painful, "knot" to her posterior scalp that she noticed last night. She denies any head injury or fall. She states she lives with her daughter and her daughter has not noticed any falls or head injuries. Pt has not taken any medications or any other treatments for the area. She notes the pain is exacerbated with applied pressure to the "knot". Pt notes she was recently diagnosed with shingles and has been taking medication which she has been compliant with. She denies any prior cysts to the area. Pt denies any injuries or bruising to her body   Past Medical History  Diagnosis Date  . Hypertension   . Hyperlipemia   . GERD (gastroesophageal reflux disease)   . Rectal bleeding   . Blood transfusion ~ 1955    "before heart OR" (05/09/2013)  . COPD (chronic obstructive pulmonary disease) ([place])   . Weight loss   . Nasal congestion   . Wheezing   . Asthma   . Hemorrhoids   . Urinary frequency   . Hypertension   . Exertional shortness of breath   . History of stomach ulcers 1990's  . Migraines   . Arthritis     "knees" (05/09/2013)   Past Surgical History  Procedure Laterality Date  . Heart surgery at 69yr  1955    "blood was going thru my lungs but not thru my heart like it should" (05/09/2013)  . Appendectomy  1970's  . Colonoscopy    . Left oophorectomy Left ~ 1985  . Colonoscopy  12/22/2011    Procedure: COLONOSCOPY;  Surgeon: Lear Ng, MD;   Location: [facility];  Service: Endoscopy;  Laterality: N/A;  . Tumor excision Left ~ 1984    "under breast" (05/09/2013)  . Knee arthroscopy Left ~ 2004; ~ 2006  . Lipoma excision  02/15/2012    Procedure: EXCISION LIPOMA;  Surgeon: Joyice Faster. Cornett, MD;  Location: [facility];  Service: General;  Laterality: Left;  excision lipoma Left Lower abdomen  . 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 Tubal ligation Left ~ 1985  . 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 Colon cancer Father   . Cancer Father     colon  . Heart disease Father   . Malignant hyperthermia Neg Hx   . Heart disease Paternal Uncle   . Diabetes Sister    Social History  Substance Use Topics  . Smoking status: Former Smoker -- 2.00 packs/day for 35 years    Types: Cigarettes    Quit date: 08/20/2013  . Smokeless tobacco: Never Used     Comment: Currently exposed to 2nd hand smoke in home   . Alcohol Use: No   OB History    No  data available     Review of Systems  All other systems reviewed and are negative.     Allergies  Sulfa antibiotics; Amoxicillin; Azithromycin; Penicillins; Propoxyphene n-acetaminophen; and Clarithromycin  Home Medications   Prior to Admission medications   Medication Sig Start Date End Date Taking? Authorizing Provider  acetaminophen-codeine (TYLENOL #3) 300-30 MG per tablet Take 1-2 tablets by mouth every 4 (four) hours as needed. 02/25/15   Roselee Culver, MD  albuterol (PROVENTIL HFA;VENTOLIN HFA) 108 (90 BASE) MCG/ACT inhaler Inhale 2 puffs into the lungs every 6 (six) hours as needed for wheezing (wheezing). Shortness of breath 09/11/12   Gregor Hams, MD  albuterol (PROVENTIL) (2.5 MG/3ML) 0.083% nebulizer solution Take 3 mLs (2.5 mg total) by nebulization every 6 (six) hours as  needed for wheezing or shortness of breath. 10/04/14   Jennifer Piepenbrink, PA-C  guaiFENesin-codeine (ROBITUSSIN AC) 100-10 MG/5ML syrup Take 10 mLs by mouth 4 (four) times daily as needed for cough. 12/24/14   Billy Fischer, MD  hydrochlorothiazide (HYDRODIURIL) 12.5 MG tablet Take 1 tablet (12.5 mg total) by mouth daily. 09/04/14   April Palumbo, MD  HYDROcodone-acetaminophen (NORCO/VICODIN) 5-325 MG per tablet Take 1-2 tablets by mouth every 4 (four) hours as needed. 04/29/15   Charlann Lange, PA-C  hydrOXYzine (ATARAX/VISTARIL) 10 MG tablet Take 1 tablet (10 mg total) by mouth 3 (three) times daily as needed. 10/25/15   Robyn Haber, MD  ibuprofen (ADVIL,MOTRIN) 600 MG tablet Take 1 tablet (600 mg total) by mouth every 6 (six) hours as needed. 04/29/15   Charlann Lange, PA-C  ipratropium (ATROVENT) 0.06 % nasal spray Place 2 sprays into both nostrils 4 (four) times daily. 12/24/14   Billy Fischer, MD  losartan (COZAAR) 50 MG tablet Take 1 tablet (50 mg total) by mouth daily. Patient not taking: Reported on 10/25/2015 04/17/13   Collene Gobble, MD  pantoprazole (PROTONIX) 20 MG tablet Take 1 tablet (20 mg total) by mouth daily. 06/12/15   Evelina Bucy, MD  predniSONE (DELTASONE) 20 MG tablet Take 2 tablets (40 mg total) by mouth daily. 10/04/14   Jennifer Piepenbrink, PA-C  predniSONE (DELTASONE) 50 MG tablet Take 1 tablet (50 mg total) by mouth daily with breakfast. 12/09/14   Liam Graham, PA-C  Tiotropium Bromide-Olodaterol (STIOLTO RESPIMAT) 2.5-2.5 MCG/ACT AERS Inhale 2 puffs into the lungs daily.    Historical Provider, MD  valACYclovir (VALTREX) 1000 MG tablet Take 1 tablet (1,000 mg total) by mouth 3 (three) times daily. 10/25/15   Robyn Haber, MD   Triage Vitals: BP 175/79 mmHg  Pulse 80  Temp(Src) 98.2 F (36.8 C) (Oral)  Resp 18  SpO2 95%  Physical Exam  Constitutional: She is oriented to person, place, and time. She appears well-developed and well-nourished. No distress.  HENT:   Head: Normocephalic and atraumatic.  2x2cm swollen area to occipital scalp, erythematous and looks consistent with a hematoma  Eyes: Conjunctivae and EOM are normal.  Neck: Neck supple. No tracheal deviation present.  Cardiovascular: Normal rate.   Pulmonary/Chest: Effort normal. No respiratory distress.  Musculoskeletal: Normal range of motion.  Neurological: She is alert and oriented to person, place, and time.  Skin: Skin is warm and dry.  Psychiatric: She has a normal mood and affect. Her behavior is normal.  Nursing note and vitals reviewed.   ED Course  Procedures (including critical care time)  DIAGNOSTIC STUDIES: Oxygen Saturation is 95% on RA, normal by my interpretation.    COORDINATION OF CARE:  4:53 PM- Will order head CT w/o contrast.  Pt advised of plan for treatment and pt agrees.  Labs Review Labs Reviewed - No data to display  Imaging Review Ct Head Wo Contrast  11/06/2015  CLINICAL DATA:  Patient with painful knot to the posterior scalp. No injury or fall. EXAM: CT HEAD WITHOUT CONTRAST TECHNIQUE: Contiguous axial images were obtained from the base of the skull through the vertex without intravenous contrast. COMPARISON:  Brain CT 09/04/2014 FINDINGS: Ventricles and sulci are appropriate for patient's age. No evidence for acute cortically based infarct, intracranial hemorrhage, mass lesion or mass effect. Periventricular and subcortical white matter hypodensity compatible with chronic small vessel ischemic changes. Orbits are unremarkable. Paranasal sinuses are well aerated. Mastoid air cells unremarkable. Calvarium is intact. Scalp soft tissues unremarkable. IMPRESSION: No acute intracranial process. Chronic small vessel ischemic changes. Electronically Signed   By: Lovey Newcomer M.D.   On: 11/06/2015 17:21   I have personally reviewed and evaluated these images as part of my medical decision-making.   EKG Interpretation None      MDM   Final diagnoses:   Contusion of head, initial encounter    Pt counseled on ct results. Pt advised to follow up with her Md for recheck to make sure area of swelling resolves in 1 week   Fransico Meadow, PA-C 11/06/15 Hubbard Hartshorn, MD 11/07/15 [phone]

## 2015-11-06 NOTE — Discharge Instructions (Signed)

## 2015-11-06 NOTE — ED Notes (Signed)
Pt complains of a knot on her posterior skull that she noticed last night. Pt denies head injury. Pt states the knot is tender to touch.

## 2015-12-17 ENCOUNTER — Other Ambulatory Visit: Payer: Self-pay | Admitting: Otolaryngology

## 2016-02-09 ENCOUNTER — Emergency Department (HOSPITAL_COMMUNITY)
Admission: EM | Admit: 2016-02-09 | Discharge: 2016-02-09 | Disposition: A | Discharge status: Home / Self Care | Payer: Medicare Other | Attending: Emergency Medicine | Admitting: Emergency Medicine

## 2016-02-09 ENCOUNTER — Encounter (HOSPITAL_COMMUNITY): Payer: Self-pay | Admitting: Family Medicine

## 2016-02-09 DIAGNOSIS — I10 Essential (primary) hypertension: Secondary | ICD-10-CM | POA: Diagnosis not present

## 2016-02-09 DIAGNOSIS — M170 Bilateral primary osteoarthritis of knee: Secondary | ICD-10-CM | POA: Insufficient documentation

## 2016-02-09 DIAGNOSIS — J449 Chronic obstructive pulmonary disease, unspecified: Secondary | ICD-10-CM | POA: Insufficient documentation

## 2016-02-09 DIAGNOSIS — L03113 Cellulitis of right upper limb: Secondary | ICD-10-CM

## 2016-02-09 DIAGNOSIS — Z79899 Other long term (current) drug therapy: Secondary | ICD-10-CM | POA: Insufficient documentation

## 2016-02-09 DIAGNOSIS — K219 Gastro-esophageal reflux disease without esophagitis: Secondary | ICD-10-CM | POA: Diagnosis not present

## 2016-02-09 DIAGNOSIS — Z87891 Personal history of nicotine dependence: Secondary | ICD-10-CM | POA: Diagnosis not present

## 2016-02-09 DIAGNOSIS — Z7952 Long term (current) use of systemic steroids: Secondary | ICD-10-CM | POA: Diagnosis not present

## 2016-02-09 DIAGNOSIS — Z8639 Personal history of other endocrine, nutritional and metabolic disease: Secondary | ICD-10-CM | POA: Insufficient documentation

## 2016-02-09 DIAGNOSIS — L988 Other specified disorders of the skin and subcutaneous tissue: Secondary | ICD-10-CM | POA: Diagnosis present

## 2016-02-09 DIAGNOSIS — Z880 Allergy status to penicillin: Secondary | ICD-10-CM | POA: Diagnosis not present

## 2016-02-09 MED ORDER — CLINDAMYCIN HCL 300 MG PO CAPS: 300.0000 mg | ORAL_CAPSULE | Freq: Four times a day (QID) | ORAL | Status: DC

## 2016-02-09 MED ORDER — TETANUS-DIPHTH-ACELL PERTUSSIS 5-2.5-18.5 LF-MCG/0.5 IM SUSP
0.5000 mL | Freq: Once | INTRAMUSCULAR | Status: DC
  Filled 2016-02-09: qty 0.5

## 2016-02-09 MED ORDER — CLINDAMYCIN HCL 300 MG PO CAPS
300.0000 mg | ORAL_CAPSULE | Freq: Once | ORAL | Status: AC
  Administered 2016-02-09: 300 mg via ORAL
  Filled 2016-02-09: qty 1

## 2016-02-09 MED ORDER — DICLOFENAC SODIUM ER 100 MG PO TB24: 100.0000 mg | ORAL_TABLET | Freq: Every day | ORAL | Status: DC

## 2016-02-09 MED ORDER — NAPROXEN 500 MG PO TABS
500.0000 mg | ORAL_TABLET | Freq: Once | ORAL | Status: AC
  Administered 2016-02-09: 500 mg via ORAL
  Filled 2016-02-09: qty 1

## 2016-02-09 NOTE — Discharge Instructions (Signed)

## 2016-02-09 NOTE — ED Notes (Signed)
Pt's right upper arm is swollen, red, and warm to touch. Pt reports she noticed it yesterday evening. Denies taking any medication or using any creams for the symptoms. Denies fever.

## 2016-02-09 NOTE — ED Notes (Signed)
MD at bedside. 

## 2016-02-09 NOTE — ED Provider Notes (Addendum)
CSN: [id number]     Arrival date & time 02/09/16  0002 History  By signing my name below, I, Summer Jackson, attest that this documentation has been prepared under the direction and in the presence of Summer Karpowicz, MD. Electronically Signed: Altamease Jackson, ED Scribe. 02/09/2016. 2:19 AM   Chief Complaint  Patient presents with  . Cellulitis    Patient is a 70 y.o. female presenting with general illness. The history is provided by the patient. No language interpreter was used.  Illness Location:  RUE Quality:  Redness, warmth, pain Severity:  Moderate Onset quality:  Gradual Duration:  1 day Timing:  Constant Progression:  Worsening Chronicity:  New Relieved by:  Nothing Worsened by:  Nothing Ineffective treatments:  None tried Associated symptoms: no chest pain, no ear pain, no fatigue, no fever, no vomiting and no wheezing    Summer Jackson is a 70 y.o. female who presents to the Emergency Department complaining of an area of increasing pain, redness, and warmth at the right bicep with onset yesterday.  Pt states that the area started small like an insect bite but has increased in size over the last 24 hours. No intervention was performed to relive her symptoms at home. She notes that she had a similar area at her right leg 1 month ago that was treated with abx. Associated symptoms include headache. Pt denies fever and vomiting.  Past Medical History  Diagnosis Date  . Hypertension   . Hyperlipemia   . GERD (gastroesophageal reflux disease)   . Rectal bleeding   . Blood transfusion ~ 1955    "before heart OR" (05/09/2013)  . COPD (chronic obstructive pulmonary disease) ([place])   . Weight loss   . Nasal congestion   . Wheezing   . Asthma   . Hemorrhoids   . Urinary frequency   . Hypertension   . Exertional shortness of breath   . History of stomach ulcers 1990's  . Migraines   . Arthritis     "knees" (05/09/2013)   Past Surgical History  Procedure Laterality Date   . Heart surgery at 73yr  1955    "blood was going thru my lungs but not thru my heart like it should" (05/09/2013)  . Appendectomy  1970's  . Colonoscopy    . Left oophorectomy Left ~ 1985  . Colonoscopy  12/22/2011    Procedure: COLONOSCOPY;  Surgeon: Lear Ng, MD;  Location[facility];  Service: Endoscopy;  Laterality: N/A;  . Tumor excision Left ~ 1984    "under breast" (05/09/2013)  . Knee arthroscopy Left ~ 2004; ~ 2006  . Lipoma excision  02/15/2012    Procedure: EXCISION LIPOMA;  Surgeon: Joyice Faster. Cornett, MD;  Location: [facility];  Service: General;  Laterality: Left;  excision lipoma Left Lower abdomen  . 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 Tubal ligation Left ~ 1985  . 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 Colon cancer Father   . Cancer Father     colon  . Heart disease Father   . Malignant hyperthermia Neg Hx   . Heart disease Paternal Uncle   . Diabetes Sister    Social History  Substance Use Topics  . Smoking status: Former Smoker -- 2.00 packs/day for 35 years  Types: Cigarettes    Quit date: 08/20/2013  . Smokeless tobacco: Never Used     Comment: Currently exposed to 2nd hand smoke in home   . Alcohol Use: No   OB History    No data available     Review of Systems  Constitutional: Negative for fever and fatigue.  HENT: Negative for ear pain.   Respiratory: Negative for wheezing.   Cardiovascular: Negative for chest pain.  Gastrointestinal: Negative for vomiting.  Skin: Positive for color change.       An area of redness, warmth, and pain at the right bicep  Hematological: Negative for adenopathy.  All other systems reviewed and are negative.  Allergies  Sulfa antibiotics; Amoxicillin; Azithromycin; Penicillins; Propoxyphene  n-acetaminophen; and Clarithromycin  Home Medications   Prior to Admission medications   Medication Sig Start Date End Date Taking? Authorizing Provider  acetaminophen-codeine (TYLENOL #3) 300-30 MG per tablet Take 1-2 tablets by mouth every 4 (four) hours as needed. 02/25/15   Roselee Culver, MD  albuterol (PROVENTIL HFA;VENTOLIN HFA) 108 (90 BASE) MCG/ACT inhaler Inhale 2 puffs into the lungs every 6 (six) hours as needed for wheezing (wheezing). Shortness of breath 09/11/12   Gregor Hams, MD  albuterol (PROVENTIL) (2.5 MG/3ML) 0.083% nebulizer solution Take 3 mLs (2.5 mg total) by nebulization every 6 (six) hours as needed for wheezing or shortness of breath. 10/04/14   Jennifer Piepenbrink, PA-C  guaiFENesin-codeine (ROBITUSSIN AC) 100-10 MG/5ML syrup Take 10 mLs by mouth 4 (four) times daily as needed for cough. 12/24/14   Billy Fischer, MD  hydrochlorothiazide (HYDRODIURIL) 12.5 MG tablet Take 1 tablet (12.5 mg total) by mouth daily. 09/04/14   Amarrion Pastorino, MD  HYDROcodone-acetaminophen (NORCO/VICODIN) 5-325 MG per tablet Take 1-2 tablets by mouth every 4 (four) hours as needed. 04/29/15   Charlann Lange, PA-C  hydrOXYzine (ATARAX/VISTARIL) 10 MG tablet Take 1 tablet (10 mg total) by mouth 3 (three) times daily as needed. 10/25/15   Robyn Haber, MD  ibuprofen (ADVIL,MOTRIN) 600 MG tablet Take 1 tablet (600 mg total) by mouth every 6 (six) hours as needed. 04/29/15   Charlann Lange, PA-C  ipratropium (ATROVENT) 0.06 % nasal spray Place 2 sprays into both nostrils 4 (four) times daily. 12/24/14   Billy Fischer, MD  losartan (COZAAR) 50 MG tablet Take 1 tablet (50 mg total) by mouth daily. Patient not taking: Reported on 10/25/2015 04/17/13   Collene Gobble, MD  pantoprazole (PROTONIX) 20 MG tablet Take 1 tablet (20 mg total) by mouth daily. 06/12/15   Evelina Bucy, MD  predniSONE (DELTASONE) 20 MG tablet Take 2 tablets (40 mg total) by mouth daily. 10/04/14   Jennifer Piepenbrink, PA-C  predniSONE  (DELTASONE) 50 MG tablet Take 1 tablet (50 mg total) by mouth daily with breakfast. 12/09/14   Liam Graham, PA-C  Tiotropium Bromide-Olodaterol (STIOLTO RESPIMAT) 2.5-2.5 MCG/ACT AERS Inhale 2 puffs into the lungs daily.    Historical Provider, MD  valACYclovir (VALTREX) 1000 MG tablet Take 1 tablet (1,000 mg total) by mouth 3 (three) times daily. 10/25/15   Robyn Haber, MD   BP 156/100 mmHg  Pulse 74  Temp(Src) 97.9 F (36.6 C) (Oral)  Resp 20  Ht 5\' 1"  (1.549 m)  Wt 183 lb (83.008 kg)  BMI 34.60 kg/m2  SpO2 100% Physical Exam  Constitutional: She is oriented to person, place, and time. She appears well-developed and well-nourished. No distress.  HENT:  Head: Normocephalic and atraumatic.  Mouth/Throat: Oropharynx is  clear and moist.  Moist mucous membranes No exudate  Eyes: Pupils are equal, round, and reactive to light.  Neck: Normal range of motion. Neck supple.  Trachea midline  Cardiovascular: Normal rate and regular rhythm.   Pulmonary/Chest: Effort normal and breath sounds normal. No stridor. No respiratory distress. She has no wheezes. She has no rales.  CTAB  Abdominal: Soft. Bowel sounds are normal. She exhibits no mass. There is no tenderness. There is no rebound and no guarding.  Musculoskeletal: Normal range of motion.  FROM at RUE  Lymphadenopathy:    She has no cervical adenopathy.  Neurological: She is alert and oriented to person, place, and time.  Skin: Skin is warm and dry.  6X4 inch area of erythema at right bicep  Psychiatric: She has a normal mood and affect. Her behavior is normal.  Nursing note and vitals reviewed.   ED Course  Procedures (including critical care time) DIAGNOSTIC STUDIES: Oxygen Saturation is 100% on RA,  normal by my interpretation.    COORDINATION OF CARE: 2:12 AM Discussed treatment plan which includes clindamycin, naproxen, and Tdap with pt at bedside and pt agreed to plan. Labs Review Labs Reviewed - No data to  display  Imaging Review No results found.   EKG Interpretation None      MDM   Final diagnoses:  None   Well appearing.  Start clindamycin qid x 7 days.  Keep site clean and dry.  Start probiotics while on antibiotics.  Return for fevers, streaking up the arm vomiting diarrhea or any concerns.  Recheck with your PMD in 2 days  I personally performed the services described in this documentation, which was scribed in my presence. The recorded information has been reviewed and is accurate.      Veatrice Kells, MD 02/09/16 0532  Kensleigh Gates, MD 02/09/16 [id number]

## 2016-02-17 ENCOUNTER — Ambulatory Visit: Payer: Medicare Other

## 2016-05-04 ENCOUNTER — Encounter (HOSPITAL_COMMUNITY): Payer: Self-pay

## 2016-05-04 ENCOUNTER — Emergency Department (HOSPITAL_COMMUNITY)
Admission: EM | Admit: 2016-05-04 | Discharge: 2016-05-04 | Disposition: A | Discharge status: Home / Self Care | Payer: Medicare Other | Attending: Emergency Medicine | Admitting: Emergency Medicine

## 2016-05-04 DIAGNOSIS — Z79891 Long term (current) use of opiate analgesic: Secondary | ICD-10-CM | POA: Diagnosis not present

## 2016-05-04 DIAGNOSIS — X58XXXA Exposure to other specified factors, initial encounter: Secondary | ICD-10-CM | POA: Insufficient documentation

## 2016-05-04 DIAGNOSIS — Y999 Unspecified external cause status: Secondary | ICD-10-CM | POA: Insufficient documentation

## 2016-05-04 DIAGNOSIS — K219 Gastro-esophageal reflux disease without esophagitis: Secondary | ICD-10-CM | POA: Insufficient documentation

## 2016-05-04 DIAGNOSIS — Z96652 Presence of left artificial knee joint: Secondary | ICD-10-CM | POA: Insufficient documentation

## 2016-05-04 DIAGNOSIS — I10 Essential (primary) hypertension: Secondary | ICD-10-CM | POA: Diagnosis not present

## 2016-05-04 DIAGNOSIS — Z7952 Long term (current) use of systemic steroids: Secondary | ICD-10-CM | POA: Insufficient documentation

## 2016-05-04 DIAGNOSIS — Y929 Unspecified place or not applicable: Secondary | ICD-10-CM | POA: Insufficient documentation

## 2016-05-04 DIAGNOSIS — J449 Chronic obstructive pulmonary disease, unspecified: Secondary | ICD-10-CM | POA: Insufficient documentation

## 2016-05-04 DIAGNOSIS — Z79899 Other long term (current) drug therapy: Secondary | ICD-10-CM | POA: Diagnosis not present

## 2016-05-04 DIAGNOSIS — E785 Hyperlipidemia, unspecified: Secondary | ICD-10-CM | POA: Diagnosis not present

## 2016-05-04 DIAGNOSIS — Y9301 Activity, walking, marching and hiking: Secondary | ICD-10-CM | POA: Diagnosis not present

## 2016-05-04 DIAGNOSIS — Z792 Long term (current) use of antibiotics: Secondary | ICD-10-CM | POA: Insufficient documentation

## 2016-05-04 DIAGNOSIS — Z7951 Long term (current) use of inhaled steroids: Secondary | ICD-10-CM | POA: Insufficient documentation

## 2016-05-04 DIAGNOSIS — Z7982 Long term (current) use of aspirin: Secondary | ICD-10-CM | POA: Insufficient documentation

## 2016-05-04 DIAGNOSIS — Z791 Long term (current) use of non-steroidal anti-inflammatories (NSAID): Secondary | ICD-10-CM | POA: Diagnosis not present

## 2016-05-04 DIAGNOSIS — M170 Bilateral primary osteoarthritis of knee: Secondary | ICD-10-CM | POA: Diagnosis not present

## 2016-05-04 DIAGNOSIS — T63001A Toxic effect of unspecified snake venom, accidental (unintentional), initial encounter: Secondary | ICD-10-CM

## 2016-05-04 DIAGNOSIS — Z87891 Personal history of nicotine dependence: Secondary | ICD-10-CM | POA: Insufficient documentation

## 2016-05-04 DIAGNOSIS — S91332A Puncture wound without foreign body, left foot, initial encounter: Secondary | ICD-10-CM | POA: Insufficient documentation

## 2016-05-04 NOTE — ED Notes (Signed)
Upon her walking to get her mail, she felt "something" bite her at inner left heel area.  She thinks that perhaps it may've been a snake.  Other than two very sm. Puncture wounds ~2.33mm apart her entire foot is normal in appearance and she is in no distress.

## 2016-05-04 NOTE — Discharge Instructions (Signed)
Snake Bite Snakes may be poisonous (venomous) or nonpoisonous (nonvenomous). A bite from a nonvenomous snake may cause a wound to the skin and possibly to the deeper tissues beneath the skin. A venomous snake will cause a wound and may also inject poison (venom) into the wound.  The effects of snake venom vary depending on the type of snake. In some cases, the effects can be extremely serious or even deadly. A bite from a venomous snake is a medical emergency. Treatment may require the use of antivenom medicine. SYMPTOMS  Symptoms of a snake bite vary depending on the type of snake, whether the snake is venomous, and the severity of the bite. Symptoms for both a venomous or nonvenomous snake may include:   Pain, redness, and swelling at the site of the bite.  Skin discoloration at the site of the bite.   A feeling of nervousness. Symptoms of a venomous snake bite may also include:   Increasing pain and swelling.  Severe anxiety or confusion.  Blood blisters or purple spots in the bite area.   Nausea and vomiting.   Numbness or tingling.   Muscle weakness.   Excessive fatigue or drowsiness.  Excessive sweating.   Difficulty breathing.   Blurred vision.   Bruising and bleeding at the site of the bite.  Feeling faint or light-headed. In some cases, symptoms do not develop until a few hours after the bite.  DIAGNOSIS  This condition may be diagnosed based on symptoms and a physical exam. Your health care provider will examine the bite area and ask for details about the snake to help determine whether it is venomous. You may also have tests, including blood tests. TREATMENT  Treatment depends on the severity of the bite and whether the snake is venomous.  Treatment for nonvenomous snake bites may involve basic wound care. This often includes cleaning the wound and applying a bandage (dressing). In some cases, antibiotic medicine or a tetanus shot may be  given.  Treatment for venomous snake bites may include antivenom medicine in addition to wound care. This medicine needs to be given as soon as possible after the bite. Other treatments may be needed to help control symptoms as they develop. You may need to stay in a hospital so your condition can be monitored. HOME CARE INSTRUCTIONS  Wound Care  Follow instructions from your health care provider about how to take care of your wound. Make sure you:   Wash your hands with soap and water before you change your dressing. If soap and water are not available, use hand sanitizer.  Change your dressing as told by your health care provider.  Keep the bite area clean and dry. Wash the bite area daily with soap and water or an antiseptic as told by your health care provider.  Check your wound every day for signs of infection. Watch for:   Redness, swelling, or pain that is getting worse.   Fluid, blood, or pus.   If you develop blistering at the site of the bite, protect the blisters from breaking. Do not attempt to open a blister. Medicines  Take or apply over-the-counter and prescription medicines only as told by your health care provider.   If you were prescribed an antibiotic, take or apply it as told by your health care provider. Do not stop using the antibiotic even if your condition improves. General Instructions  Keep the affected area raised (elevated) above the level of your heart while you are sitting  or lying down, if possible.  Keep all follow-up visits as told by your health care provider. This is important. SEEK MEDICAL CARE IF:   You have increased redness, swelling, or pain at the site of your wound.  You have fluid, blood, or pus coming from your wound.  You have a fever. SEEK IMMEDIATE MEDICAL CARE IF:   You develop blood blisters or purple spots in the bite area.   You have nausea or vomiting.   You have numbness or tingling.   You have excessive  sweating.   You have trouble breathing.   You have vision problems.   You feel very confused.  You feel faint or light-headed.    This information is not intended to replace advice given to you by your health care provider. Make sure you discuss any questions you have with your health care provider.   Document Released: 12/02/2000 Document Revised: 08/26/2015 Document Reviewed: 04/22/2015 Elsevier Interactive Patient Education [employer].

## 2016-05-04 NOTE — ED Provider Notes (Signed)
CSN: [id number]     Arrival date & time 05/04/16  1544 History   First MD Initiated Contact with Patient 05/04/16 1820     Chief Complaint  Patient presents with  . Snake Bite     HPI Patient presents after possible snake bite to her left foot. States she was not get the mail and felt something sharp in her foot. She did not see anything to it. He states his 2 small holes on her foot and thinks it could've been a snake. She is bitten shortly before arrival but was nearly 3 hours by the time she had been seen in the ER. The pain is been somewhat stable. No swelling. No fevers. No bowel pain. No bruising.   Past Medical History  Diagnosis Date  . Hypertension   . Hyperlipemia   . GERD (gastroesophageal reflux disease)   . Rectal bleeding   . Blood transfusion ~ 1955    "before heart OR" (05/09/2013)  . COPD (chronic obstructive pulmonary disease) ([place])   . Weight loss   . Nasal congestion   . Wheezing   . Asthma   . Hemorrhoids   . Urinary frequency   . Hypertension   . Exertional shortness of breath   . History of stomach ulcers 1990's  . Migraines   . Arthritis     "knees" (05/09/2013)   Past Surgical History  Procedure Laterality Date  . Heart surgery at 77yr  1955    "blood was going thru my lungs but not thru my heart like it should" (05/09/2013)  . Appendectomy  1970's  . Colonoscopy    . Left oophorectomy Left ~ 1985  . Colonoscopy  12/22/2011    Procedure: COLONOSCOPY;  Surgeon: Lear Ng, MD;  Location: [facility];  Service: Endoscopy;  Laterality: N/A;  . Tumor excision Left ~ 1984    "under breast" (05/09/2013)  . Knee arthroscopy Left ~ 2004; ~ 2006  . Lipoma excision  02/15/2012    Procedure: EXCISION LIPOMA;  Surgeon: Joyice Faster. Cornett, MD;  Location: [facility];  Service: General;  Laterality: Left;  excision lipoma Left Lower abdomen  . 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 Tubal ligation Left ~ 1985  . Total knee arthroplasty  Left 05/08/2013    Procedure: TOTAL KNEE ARTHROPLASTY;  Surgeon: Ninetta Lights, MD;  Location: [facility];  Service: Orthopedics;  Laterality: Left;   Family History  Problem Relation Age of Onset  . Colon cancer Father   . Cancer Father     colon  . Heart disease Father   . Malignant hyperthermia Neg Hx   . Heart disease Paternal Uncle   . Diabetes Sister    Social History  Substance Use Topics  . Smoking status: Former Smoker -- 2.00 packs/day for 35 years    Types: Cigarettes    Quit date: 08/20/2013  . Smokeless tobacco: Never Used     Comment: Currently exposed to 2nd hand smoke in home   . Alcohol Use: No   OB History    No data available     Review of Systems  Constitutional: Negative for appetite change.  Respiratory: Negative for shortness of breath.   Cardiovascular: Negative for chest pain.  Gastrointestinal: Negative for abdominal pain.  Musculoskeletal: Negative for joint swelling and gait problem.  Skin: Positive for wound.  Hematological: Negative for adenopathy.      Allergies  Sulfa antibiotics; Amoxicillin; Azithromycin; Penicillins; Propoxyphene n-acetaminophen; and Clarithromycin  Home Medications   Prior to Admission medications   Medication Sig Start Date End Date Taking? Authorizing Provider  albuterol (PROVENTIL HFA;VENTOLIN HFA) 108 (90 BASE) MCG/ACT inhaler Inhale 2 puffs into the lungs every 6 (six) hours as needed for wheezing (wheezing). Shortness of breath 09/11/12  Yes Gregor Hams, MD  albuterol (PROVENTIL) (2.5 MG/3ML) 0.083% nebulizer solution Take 3 mLs (2.5 mg total) by nebulization every 6 (six) hours as needed for wheezing or shortness of breath. 10/04/14  Yes Jennifer Piepenbrink, PA-C  aspirin EC 81 MG tablet Take 81 mg by mouth daily.   Yes Historical Provider, MD  ipratropium (ATROVENT) 0.06 % nasal spray Place 2 sprays into both nostrils 4 (four) times daily. 12/24/14  Yes Billy Fischer, MD  losartan (COZAAR) 100 MG tablet Take 100  mg by mouth daily.  05/02/16  Yes Historical Provider, MD  losartan (COZAAR) 50 MG tablet Take 1 tablet (50 mg total) by mouth daily. 04/17/13  Yes Collene Gobble, MD  acetaminophen-codeine (TYLENOL #3) 300-30 MG per tablet Take 1-2 tablets by mouth every 4 (four) hours as needed. Patient not taking: Reported on 02/09/2016 02/25/15   Roselee Culver, MD  clindamycin (CLEOCIN) 300 MG capsule Take 1 capsule (300 mg total) by mouth 4 (four) times daily. X 7 days Patient not taking: Reported on 05/04/2016 02/09/16   April Palumbo, MD  Diclofenac Sodium CR (VOLTAREN-XR) 100 MG 24 hr tablet Take 1 tablet (100 mg total) by mouth daily. Patient not taking: Reported on 05/04/2016 02/09/16   April Palumbo, MD  guaiFENesin-codeine [facility]) 100-10 MG/5ML syrup Take 10 mLs by mouth 4 (four) times daily as needed for cough. Patient not taking: Reported on 02/09/2016 12/24/14   Billy Fischer, MD  hydrochlorothiazide (HYDRODIURIL) 12.5 MG tablet Take 1 tablet (12.5 mg total) by mouth daily. Patient not taking: Reported on 02/09/2016 09/04/14   April Palumbo, MD  HYDROcodone-acetaminophen (NORCO/VICODIN) 5-325 MG per tablet Take 1-2 tablets by mouth every 4 (four) hours as needed. Patient not taking: Reported on 02/09/2016 04/29/15   Charlann Lange, PA-C  hydrOXYzine (ATARAX/VISTARIL) 10 MG tablet Take 1 tablet (10 mg total) by mouth 3 (three) times daily as needed. Patient not taking: Reported on 02/09/2016 10/25/15   Robyn Haber, MD  ibuprofen (ADVIL,MOTRIN) 600 MG tablet Take 1 tablet (600 mg total) by mouth every 6 (six) hours as needed. Patient not taking: Reported on 02/09/2016 04/29/15   Charlann Lange, PA-C  pantoprazole (PROTONIX) 20 MG tablet Take 1 tablet (20 mg total) by mouth daily. Patient not taking: Reported on 02/09/2016 06/12/15   Evelina Bucy, MD  predniSONE (DELTASONE) 20 MG tablet Take 2 tablets (40 mg total) by mouth daily. Patient not taking: Reported on 02/09/2016 10/04/14   Baron Sane,  PA-C  predniSONE (DELTASONE) 50 MG tablet Take 1 tablet (50 mg total) by mouth daily with breakfast. Patient not taking: Reported on 05/04/2016 12/09/14   Liam Graham, PA-C  valACYclovir (VALTREX) 1000 MG tablet Take 1 tablet (1,000 mg total) by mouth 3 (three) times daily. Patient not taking: Reported on 02/09/2016 10/25/15   Robyn Haber, MD   BP 150/79 mmHg  Pulse 80  Temp(Src) 98.2 F (36.8 C) (Oral)  Resp 22  SpO2 98% Physical Exam  Constitutional: She appears well-developed.  HENT:  Head: Normocephalic.  Pulmonary/Chest: No respiratory distress.  Abdominal: Soft. There is no tenderness.  Musculoskeletal: She exhibits no tenderness.  2 small possible puncture wounds in the left medial lower heel  area. Approximately 3 mm apart. Minimal tenderness at the site. Minimal erythema. Slight swelling. No swelling of the foot otherwise. No swelling of the calf or ankle.  Skin: Skin is warm.    ED Course  Procedures (including critical care time) Labs Review Labs Reviewed - No data to display  Imaging Review No results found. I have personally reviewed and evaluated these images and lab results as part of my medical decision-making.   EKG Interpretation None      MDM   Final diagnoses:  Snake bite, accidental or unintentional, initial encounter    Patient with possible snake bite to left foot. Happened around 3 hours prior to me seeing her. At this point is been stable and does not appear very raised erythematous or tender. Will discharge home. Likely either dry bite or non-poisonous snake. May also not been a snake bite at all.  Davonna Belling, MD 05/04/16 1901

## 2016-12-16 DIAGNOSIS — K2100 Gastro-esophageal reflux disease with esophagitis, without bleeding: Secondary | ICD-10-CM

## 2016-12-16 DIAGNOSIS — R1313 Dysphagia, pharyngeal phase: Secondary | ICD-10-CM

## 2016-12-16 DIAGNOSIS — R0989 Other specified symptoms and signs involving the circulatory and respiratory systems: Secondary | ICD-10-CM | POA: Insufficient documentation

## 2016-12-16 DIAGNOSIS — R09A2 Foreign body sensation, throat: Secondary | ICD-10-CM

## 2016-12-16 HISTORY — DX: Dysphagia, pharyngeal phase: R13.13

## 2016-12-16 HISTORY — DX: Foreign body sensation, throat: R09.A2

## 2016-12-16 HISTORY — DX: Gastro-esophageal reflux disease with esophagitis, without bleeding: K21.00

## 2016-12-16 HISTORY — DX: Other specified symptoms and signs involving the circulatory and respiratory systems: R09.89

## 2017-01-02 ENCOUNTER — Ambulatory Visit: Payer: Medicare Other | Admitting: Pediatrics

## 2017-01-03 ENCOUNTER — Encounter: Payer: Self-pay | Admitting: Gastroenterology

## 2017-01-09 ENCOUNTER — Ambulatory Visit: Payer: Medicare Other | Admitting: Pediatrics

## 2018-03-20 DIAGNOSIS — R519 Headache, unspecified: Secondary | ICD-10-CM | POA: Insufficient documentation

## 2018-03-20 DIAGNOSIS — R51 Headache: Secondary | ICD-10-CM

## 2018-03-20 HISTORY — DX: Headache, unspecified: R51.9

## 2018-03-26 DIAGNOSIS — G2589 Other specified extrapyramidal and movement disorders: Secondary | ICD-10-CM | POA: Insufficient documentation

## 2018-03-26 DIAGNOSIS — R0789 Other chest pain: Secondary | ICD-10-CM

## 2018-03-26 HISTORY — DX: Other chest pain: R07.89

## 2018-03-26 HISTORY — DX: Other specified extrapyramidal and movement disorders: G25.89

## 2018-08-28 DIAGNOSIS — E785 Hyperlipidemia, unspecified: Secondary | ICD-10-CM

## 2018-08-28 DIAGNOSIS — I639 Cerebral infarction, unspecified: Secondary | ICD-10-CM

## 2018-08-28 DIAGNOSIS — R7303 Prediabetes: Secondary | ICD-10-CM

## 2018-08-28 DIAGNOSIS — I10 Essential (primary) hypertension: Secondary | ICD-10-CM

## 2018-08-28 DIAGNOSIS — R200 Anesthesia of skin: Secondary | ICD-10-CM

## 2018-08-28 DIAGNOSIS — E669 Obesity, unspecified: Secondary | ICD-10-CM

## 2018-09-05 DIAGNOSIS — R1032 Left lower quadrant pain: Secondary | ICD-10-CM

## 2018-09-05 HISTORY — DX: Left lower quadrant pain: R10.32

## 2018-11-23 DIAGNOSIS — F17200 Nicotine dependence, unspecified, uncomplicated: Secondary | ICD-10-CM | POA: Insufficient documentation

## 2018-11-23 DIAGNOSIS — E559 Vitamin D deficiency, unspecified: Secondary | ICD-10-CM | POA: Insufficient documentation

## 2018-11-23 HISTORY — DX: Nicotine dependence, unspecified, uncomplicated: F17.200

## 2019-01-18 DIAGNOSIS — M8589 Other specified disorders of bone density and structure, multiple sites: Secondary | ICD-10-CM | POA: Insufficient documentation

## 2019-01-29 ENCOUNTER — Encounter: Payer: Self-pay | Admitting: Podiatry

## 2019-01-29 ENCOUNTER — Ambulatory Visit (INDEPENDENT_AMBULATORY_CARE_PROVIDER_SITE_OTHER): Payer: Medicare Other | Admitting: Podiatry

## 2019-01-29 DIAGNOSIS — L600 Ingrowing nail: Secondary | ICD-10-CM | POA: Diagnosis not present

## 2019-01-29 DIAGNOSIS — M79674 Pain in right toe(s): Secondary | ICD-10-CM | POA: Diagnosis not present

## 2019-01-29 DIAGNOSIS — M79675 Pain in left toe(s): Secondary | ICD-10-CM

## 2019-01-29 DIAGNOSIS — B351 Tinea unguium: Secondary | ICD-10-CM

## 2019-01-29 NOTE — Patient Instructions (Signed)

## 2019-01-29 NOTE — Progress Notes (Signed)
Subjective:    Patient ID: Summer Jackson, female    DOB: [birth year], 73 y.o.   MRN: [id number]  HPI 73 year old female presents the office today for concerns of chronic ingrown toenails to both of her big toes that she wants to have cut out.  The left side is worse than the right.  She gets ingrowing on both of the nail corners.  They are painful with pressure in shoes and this is been a chronic issue.  She denies any redness or drainage or any swelling.  She states her nails been try to cut out on her own but any significant improvement.  She is also asking for other toenails be trimmed today's are thickened elongated she cannot trim them herself. No other concerns.    Review of Systems  All other systems reviewed and are negative.      Objective:   Physical Exam General: AAO x3, NAD  Dermatological:  There is incurvation present to both medial lateral aspects of bilateral hallux toenails with the left side worse than the right.  In general her toenails are hypertrophic with yellow to brown discoloration and dystrophic.  There is tenderness nails 1-5 bilaterally.  To the other toenails except for the hallux toenails is just the tip of the nails.  No open lesions.  Vascular: Dorsalis Pedis artery and Posterior Tibial artery pedal pulses are 2/4 bilateral with immedate capillary fill time. There is no pain with calf compression, swelling, warmth, erythema.   Neruologic: Grossly intact via light touch bilateral.  Protective threshold with Semmes Wienstein monofilament intact to all pedal sites bilateral.   Musculoskeletal: No gross boney pedal deformities bilateral. No pain, crepitus, or limitation noted with foot and ankle range of motion bilateral. Muscular strength 5/5 in all groups tested bilateral.     Assessment & Plan:  Symptomatic bilateral hallux ingrown toenails with onychomycosis -Treatment options discussed including all alternatives, risks, and complications -Etiology of  symptoms were discussed -We discussed partial nail avulsion with chemical matricectomy to bilateral hallux medial, lateral nail corners.  She wants to proceed with this.  See procedure note below.  We will start the left side today and likely to the right side next appointment. -Nails debrided x9 without any complications or bleeding to the other toenails. -At this time, the patient is requesting partial nail removal with chemical matricectomy to the symptomatic portion of the nail. Risks and complications were discussed with the patient for which they understand and written consent was obtained. Under sterile conditions a total of 3 mL of a mixture of 2% lidocaine plain and 0.5% Marcaine plain was infiltrated in a hallux block fashion. Once anesthetized, the skin was prepped in sterile fashion. A tourniquet was then applied. Next the medial and lateral aspect of hallux nail border was then sharply excised making sure to remove the entire offending nail border. Once the nails were ensured to be removed area was debrided and the underlying skin was intact. There is no purulence identified in the procedure. Next phenol was then applied under standard conditions and copiously irrigated. Silvadene was applied. A dry sterile dressing was applied. After application of the dressing the tourniquet was removed and there is found to be an immediate capillary refill time to the digit. The patient tolerated the procedure well any complications. Post procedure instructions were discussed the patient for which he verbally understood. Follow-up in one week for nail check or sooner if any problems are to arise. Discussed signs/symptoms of infection  and directed to call the office immediately should any occur or go directly to the emergency room. In the meantime, encouraged to call the office with any questions, concerns, changes symptoms.  Trula Slade DPM

## 2019-02-06 ENCOUNTER — Encounter: Payer: Self-pay | Admitting: Podiatry

## 2019-02-06 ENCOUNTER — Ambulatory Visit (INDEPENDENT_AMBULATORY_CARE_PROVIDER_SITE_OTHER): Payer: Medicare Other | Admitting: Podiatry

## 2019-02-06 DIAGNOSIS — L600 Ingrowing nail: Secondary | ICD-10-CM | POA: Diagnosis not present

## 2019-02-06 NOTE — Patient Instructions (Signed)

## 2019-02-12 ENCOUNTER — Ambulatory Visit: Payer: Medicare Other | Admitting: Podiatry

## 2019-02-13 NOTE — Progress Notes (Signed)
Subjective: 73 year old female presents the office today for follow-up evaluation of bilateral chronic ingrown toenails.  Last appointment she underwent partial nail avulsion with chemical matricectomy on the left side she states that she is been doing well.  She is been soaking Epson salts and she denies any pain, drainage or pus or any redness or red streaks.  She wants to proceed with the procedure on the right side as it is also very painful for her to both nail corners. Denies any systemic complaints such as fevers, chills, nausea, vomiting. No acute changes since last appointment, and no other complaints at this time.   Objective: AAO x3, NAD DP/PT pulses palpable bilaterally, CRT less than 3 seconds Status post partial nail avulsion to the medial lateral corners left hallux toenail a small scab is present but there is no drainage or pus there is no surrounding erythema, ascending cellulitis.  No fluctuation crepitation.  Appears to be healing very nicely.  There is continuation of chronic ingrowing to the right hallux with medial lateral corners with tenderness palpation.  No drainage or ulcer signs of infection.  No open lesions or pre-ulcerative lesions.  No pain with calf compression, swelling, warmth, erythema  Assessment: Right chronic ingrown toenail; procedure site left side  Plan: -All treatment options discussed with the patient including all alternatives, risks, complications.  -At this time, the patient is requesting partial nail removal with chemical matricectomy to the symptomatic portion of the nail. Risks and complications were discussed with the patient for which they understand and written consent was obtained. Under sterile conditions a total of 3 mL of a mixture of 2% lidocaine plain and 0.5% Marcaine plain was infiltrated in a hallux block fashion. Once anesthetized, the skin was prepped in sterile fashion. A tourniquet was then applied. Next the medial and lateral aspect of  hallux nail border was then sharply excised making sure to remove the entire offending nail border. Once the nails were ensured to be removed area was debrided and the underlying skin was intact. There is no purulence identified in the procedure. Next phenol was then applied under standard conditions and copiously irrigated. Silvadene was applied. A dry sterile dressing was applied. After application of the dressing the tourniquet was removed and there is found to be an immediate capillary refill time to the digit. The patient tolerated the procedure well any complications. Post procedure instructions were discussed the patient for which he verbally understood. Follow-up in one week for nail check or sooner if any problems are to arise. Discussed signs/symptoms of infection and directed to call the office immediately should any occur or go directly to the emergency room. In the meantime, encouraged to call the office with any questions, concerns, changes symptoms. -Continue soaking on the left side apply antibiotic ointment and a bandage in the day.  Can leave open on the left side at nighttime. -Patient encouraged to call the office with any questions, concerns, change in symptoms.   Trula Slade DPM

## 2019-02-21 ENCOUNTER — Ambulatory Visit: Payer: Medicare Other | Admitting: Podiatry

## 2019-02-22 ENCOUNTER — Encounter: Payer: Self-pay | Admitting: Podiatry

## 2019-02-22 ENCOUNTER — Ambulatory Visit (INDEPENDENT_AMBULATORY_CARE_PROVIDER_SITE_OTHER): Payer: Medicare Other | Admitting: Podiatry

## 2019-02-22 ENCOUNTER — Ambulatory Visit (INDEPENDENT_AMBULATORY_CARE_PROVIDER_SITE_OTHER): Payer: Medicare Other

## 2019-02-22 DIAGNOSIS — L600 Ingrowing nail: Secondary | ICD-10-CM

## 2019-02-22 DIAGNOSIS — M7989 Other specified soft tissue disorders: Secondary | ICD-10-CM

## 2019-02-22 DIAGNOSIS — M722 Plantar fascial fibromatosis: Secondary | ICD-10-CM

## 2019-02-22 DIAGNOSIS — M7730 Calcaneal spur, unspecified foot: Secondary | ICD-10-CM | POA: Diagnosis not present

## 2019-02-22 MED ORDER — DICLOFENAC SODIUM 1 % TD GEL: 2.0000 g | Freq: Four times a day (QID) | TRANSDERMAL | 2 refills | Status: DC

## 2019-02-23 ENCOUNTER — Ambulatory Visit: Payer: Medicare Other | Admitting: Podiatry

## 2019-02-25 ENCOUNTER — Telehealth: Payer: Self-pay | Admitting: *Deleted

## 2019-02-25 NOTE — Telephone Encounter (Addendum)
I am calling to reschedule your office surgery.  Dr. Jacqualyn Posey cannot do it on March 11.  His next available date is April 15.  "Schedule me for then but if he has a cancellation, please let me know so I can move up."  I will let you know.  I spoke to Dr. Jacqualyn Posey he said he can do your surgery on March 11 but you will need to be here at 12:30 pm.  Please call me back and let me know that you received this message.

## 2019-02-25 NOTE — Progress Notes (Signed)
Subjective: 73 year old female presents the office today for nail check after having bilateral hallux medial, lateral partial nail avulsions.  She states her nails are doing well she is having no pain he denies any redness or drainage or any swelling.  She has concerns today of pain to the backs of both of her heel which is been a chronic issue for her however the pain is intermittent.  She also has a soft tissue mass on top of her left foot that she wants to have removed.  She said this no other areas that had to be removed and she was have this removed as it does cause discomfort. Denies any systemic complaints such as fevers, chills, nausea, vomiting. No acute changes since last appointment, and no other complaints at this time.   Objective: AAO x3, NAD DP/PT pulses palpable bilaterally, CRT less than 3 seconds Bilateral hallux toenails appear to be doing well.  Scabs are present there is no edema, erythema, drainage or pus there is no clinical signs of infection noted today. On the dorsal aspect the left midfoot is a small mobile firm soft tissue mass consistent with a small cyst.  This is what is causing discomfort for her and that she wants to have removed.  Is no overlying erythema or increase in warmth.  She states that she has had several cysts on her body removed and she wants to have this removed as well. There is tenderness in the posterior aspects of bilateral heels and on prominent bone spur.  Equinus is present.  No pain with lateral compression of calcaneus.  No edema, erythema. No open lesions or pre-ulcerative lesions.  No pain with calf compression, swelling, warmth, erythema  Assessment: 73 year old female with healing procedure sites bilateral hallux; bilateral posterior heel spur; left foot soft tissue mass  Plan: -All treatment options discussed with the patient including all alternatives, risks, complications.  -From a procedure standpoint bilateral hallux she is doing well.   I want her to continue soaking Epson salts as well as keep antibiotic ointment and a bandage to the area. Monitor for any clinical signs or symptoms of infection and directed to call the office immediately should any occur or go to the ER. -X-rays were obtained and reviewed.  Calcaneal spurring is present.  There is no evidence of acute fracture.  To posterior heels we discussed traction, icing exercises daily.  Also she can use Voltaren gel.  I dispensed a heel lift as well as gel offloading pads. -Regards to soft tissue mass we discussed with conservative as well as surgical treatment options.  She wants to have the area removed.  We discussed surgical excision of the area.  Discussed the surgery as well as the postoperative course.  She understands this and she wishes to proceed.  We will plan on doing this under local anesthesia in the office.  -The incision placement as well as the postoperative course was discussed with the patient. I discussed risks of the surgery which include, but not limited to, infection, bleeding, pain, swelling, need for further surgery, delayed or nonhealing, painful or ugly scar, numbness or sensation changes, over/under correction, recurrence, transfer lesions, further deformity, hardware failure, DVT/PE, loss of toe/foot. Patient understands these risks and wishes to proceed with surgery. The surgical consent was reviewed with the patient all 3 pages were signed. No promises or guarantees were given to the outcome of the procedure. All questions were answered to the best of my ability. Before the surgery the  patient was encouraged to call the office if there is any further questions. The surgery will be performed in the office under local anesthesia.  -Patient encouraged to call the office with any questions, concerns, change in symptoms.   Trula Slade DPM

## 2019-02-27 ENCOUNTER — Ambulatory Visit: Payer: Medicare Other | Admitting: Podiatry

## 2019-02-27 NOTE — Telephone Encounter (Signed)
Postop visits have been rescheduled.

## 2019-02-27 NOTE — Telephone Encounter (Signed)
I am calling you in regards to your surgery.  I had left you a message that Dr. Jacqualyn Posey could do it today.  I asked you to be here at 12:30 pm.  "I didn't get that message.  Can you reschedule it?  I'll schedule you for April 15.  "Okay, thank you.  Sorry I can't do it today. I got too much running around to do."

## 2019-03-04 ENCOUNTER — Encounter: Payer: Medicare Other | Admitting: Podiatry

## 2019-03-27 DIAGNOSIS — G9009 Other idiopathic peripheral autonomic neuropathy: Secondary | ICD-10-CM | POA: Insufficient documentation

## 2019-03-27 DIAGNOSIS — M5416 Radiculopathy, lumbar region: Secondary | ICD-10-CM | POA: Insufficient documentation

## 2019-03-27 DIAGNOSIS — G894 Chronic pain syndrome: Secondary | ICD-10-CM | POA: Insufficient documentation

## 2019-03-27 HISTORY — DX: Other idiopathic peripheral autonomic neuropathy: G90.09

## 2019-04-01 ENCOUNTER — Telehealth: Payer: Self-pay | Admitting: *Deleted

## 2019-04-01 NOTE — Telephone Encounter (Signed)
I am calling to see if you intend on coming for your surgery on Wednesday.  "I'm going to have to get back to you.  I'm going to have to ask Dr. Ermalene Postin.  I'm supposed to have some test done on Tuesday."  Please let me know as soon as possible.  "Okay, I will."

## 2019-04-02 ENCOUNTER — Telehealth: Payer: Self-pay | Admitting: *Deleted

## 2019-04-02 NOTE — Telephone Encounter (Signed)
"  I am calling about my surgery for tomorrow.  He said it was going to be done at [facility]."  Your surgery is here in the office on tomorrow.  "He said he wouldn't do it in the office on account of me having an aneuerysm in the past.  Maybe he forgot, can you ask him?"  I asked Dr. Jacqualyn Posey.  He said he told you that if you wanted to have the bone spurs removed, you would need to have it done at [facility].  He said if you are wanting the spurs done, [facility] is not allowing any elective surgeries be done at this time due to the [facility] Virus.  "I'll go ahead and have that procedure done.  Will I need someone to come with me?"  You do not need anyone since the surgery will be done on your left foot.  Dr. Jacqualyn Posey does want to know if we can change your surgery time to 9:30 am or 10 am.  "Why does he want me to come earlier?"  He was scheduled to have surgery at the surgical center previously when you scheduled your appointment.  We had to reschedule those patients.  "I'll try my best to be there at 9:30 am."

## 2019-04-02 NOTE — Telephone Encounter (Signed)
Called and left a message for the patient to call us back to see if she was coming in to have the surgery done with Dr Jacqualyn Posey tomorrow and I left the [place] number [phone]. Lattie Haw

## 2019-04-03 ENCOUNTER — Encounter: Payer: Self-pay | Admitting: Podiatry

## 2019-04-03 ENCOUNTER — Other Ambulatory Visit: Payer: Self-pay | Admitting: Podiatry

## 2019-04-03 ENCOUNTER — Ambulatory Visit (INDEPENDENT_AMBULATORY_CARE_PROVIDER_SITE_OTHER): Payer: Medicare Other | Admitting: Podiatry

## 2019-04-03 ENCOUNTER — Other Ambulatory Visit: Payer: Self-pay

## 2019-04-03 VITALS — Temp 98.2°F

## 2019-04-03 DIAGNOSIS — M7989 Other specified soft tissue disorders: Secondary | ICD-10-CM

## 2019-04-03 DIAGNOSIS — M67472 Ganglion, left ankle and foot: Secondary | ICD-10-CM | POA: Diagnosis not present

## 2019-04-03 MED ORDER — DOXYCYCLINE HYCLATE 100 MG PO TABS: 100.0000 mg | ORAL_TABLET | Freq: Two times a day (BID) | ORAL | 0 refills | Status: DC

## 2019-04-03 MED ORDER — HYDROCODONE-ACETAMINOPHEN 5-325 MG PO TABS: 1.0000 | ORAL_TABLET | Freq: Four times a day (QID) | ORAL | 0 refills | Status: DC | PRN

## 2019-04-04 NOTE — Progress Notes (Signed)
Subjective: 73 year old female presents the office today for concerns of continued cyst on top of her left foot.  She has been doing somewhat better but has a red inside of her shoes.  She tried offloading area and symptom improvement.  Attention was proceed with having the cyst removed and biopsied. Denies any systemic complaints such as fevers, chills, nausea, vomiting. No acute changes since last appointment, and no other complaints at this time.   Objective: AAO x3, NAD DP/PT pulses palpable bilaterally, CRT less than 3 seconds The dorsal aspect of the left foot on central aspect there is a small firm tissue mass with tenderness palpation of slight erythema over the area from irritation.  There is no skin breakdown.  No fluctuation crepitation any clinical signs of infection. No open lesions or pre-ulcerative lesions.  No pain with calf compression, swelling, warmth, erythema  Assessment: Soft tissue mass left foot  Plan: -All treatment options discussed with the patient including all alternatives, risks, complications.  --Discussed with her surgical excision of the area under local anesthesia we discussed the procedure as well as the postoperative course.  We again discussed alternatives, risks, complications.  No promises guarantees.  She wished to proceed with the procedure today.  The skin was prepped with alcohol and mixture of 3 cc of lidocaine with epinephrine was infiltrated in a regional block fashion around the soft tissue mass.  She was then brought back into the procedure suite.  A well-padded pneumatic ankle tourniquet applied to the left ankle.  The left foot was then scrubbed, prepped, draped in normal sterile fashion.  A vertical incision was made approximately 1.5 cm in length overlying the area and soft tissue mass.  Incision was made with a 15 blade scalpel the epidermis and the dermis.  Blunt dissection was then carried down to reveal a firm soft tissue mass.  This was easily  identifiable.  There is been bluntly dissected and was removed.  This was sent to pathology. It measured about 0.8cm.  At this time there is no further soft tissue mass identified.  There is no signs of an abscess or infection.  Incision was irrigated with saline.  The skin was then closed with 5-0 nylon in a horizontal mattress fashion.  Betadine was painted over the incision followed by Adaptic and a dry sterile dressing.  Tourniquet was released and there is found to be an immediate capillary refill time to all the digits.  She tolerated well and complications.  Post procedure instructions were discussed. -Monitor for any clinical signs or symptoms of infection and directed to call the office immediately should any occur or go to the ER. -Patient encouraged to call the office with any questions, concerns, change in symptoms.   Trula Slade DPM

## 2019-04-05 LAB — TISSUE SPECIMEN

## 2019-04-05 LAB — PATHOLOGY

## 2019-04-08 ENCOUNTER — Other Ambulatory Visit: Payer: Self-pay

## 2019-04-08 ENCOUNTER — Encounter: Payer: Self-pay | Admitting: Podiatry

## 2019-04-08 ENCOUNTER — Ambulatory Visit (INDEPENDENT_AMBULATORY_CARE_PROVIDER_SITE_OTHER): Payer: Medicare Other | Admitting: Podiatry

## 2019-04-08 VITALS — Temp 97.7°F

## 2019-04-08 DIAGNOSIS — M7138 Other bursal cyst, other site: Secondary | ICD-10-CM | POA: Insufficient documentation

## 2019-04-08 DIAGNOSIS — L03116 Cellulitis of left lower limb: Secondary | ICD-10-CM | POA: Insufficient documentation

## 2019-04-08 DIAGNOSIS — M7989 Other specified soft tissue disorders: Secondary | ICD-10-CM

## 2019-04-08 HISTORY — DX: Other bursal cyst, other site: M71.38

## 2019-04-08 NOTE — Progress Notes (Signed)
Subjective: Summer Jackson is a 73 y.o. is seen today in office s/p left foot soft tissue mass excision preformed on 04/03/2019.overall she is doing well.  She is not taking any antibiotics because she said that she had no pain.  She is noticed some bruising to her toes.  She still wearing a surgical shoe.  Denies any systemic complaints such as fevers, chills, nausea, vomiting. No calf pain, chest pain, shortness of breath.   Objective: General: No acute distress, AAOx3  DP/PT pulses palpable 2/4, CRT < 3 sec to all digits.  Protective sensation intact. Motor function intact.  LEFT foot: Incision is well coapted without any evidence of dehiscence and sutures are intact. There is no surrounding erythema crusting with formation of the post infection there is no ascending cellulitis, fluctuance, crepitus, malodor, drainage/purulence. There is trace edema around the surgical site. There is no pain along the surgical site.  Minimal bruising to the toes. No numbness or tingling. No other areas of tenderness to bilateral lower extremities.  No other open lesions or pre-ulcerative lesions.  No pain with calf compression, swelling, warmth, erythema.   Assessment and Plan:  Status post Left foot soft tissue mass excision, doing well with no complications   -Treatment options discussed including all alternatives, risks, and complications -Insulin-dependent erythema but this is likely more from inflammation as opposed to infection.  Start antibiotics.  Antibiotic ointment and a bandage was applied to the incision site. -Postoperative incision wet to later this week.  If there is any issue with incision. -Ice/elevation -Pain medication as needed. -Reviewed pathology report with her and she was provided with a copy.  -Monitor for any clinical signs or symptoms of infection and DVT/PE and directed to call the office immediately should any occur or go to the ER. -Follow-up in 1 week for suture removal or  sooner if any problems arise. In the meantime, encouraged to call the office with any questions, concerns, change in symptoms.   Celesta Gentile, DPM

## 2019-04-10 DIAGNOSIS — M4316 Spondylolisthesis, lumbar region: Secondary | ICD-10-CM | POA: Insufficient documentation

## 2019-04-10 HISTORY — DX: Spondylolisthesis, lumbar region: M43.16

## 2019-04-15 ENCOUNTER — Encounter: Payer: Self-pay | Admitting: Podiatry

## 2019-04-15 ENCOUNTER — Other Ambulatory Visit: Payer: Self-pay

## 2019-04-15 ENCOUNTER — Ambulatory Visit (INDEPENDENT_AMBULATORY_CARE_PROVIDER_SITE_OTHER): Payer: Medicare Other | Admitting: Podiatry

## 2019-04-15 VITALS — Temp 97.3°F

## 2019-04-15 DIAGNOSIS — M7989 Other specified soft tissue disorders: Secondary | ICD-10-CM

## 2019-04-16 NOTE — Progress Notes (Signed)
Subjective: Summer Jackson is a 73 y.o. is seen today in office s/p left foot soft tissue mass excision preformed on 04/03/2019.  She states that overall she is doing well she presents today for suture removal.  She still wearing a surgical shoe.  She has been taking any pain medication.  She denies any fevers, chills, nausea, vomiting.  No calf pain, chest pain, shortness of breath.  She has no other concerns today.  Objective: General: No acute distress, AAOx3  DP/PT pulses palpable 2/4, CRT < 3 sec to all digits.  Protective sensation intact. Motor function intact.  LEFT foot: Incision is well coapted without any evidence of dehiscence and sutures are intact. There is no surrounding erythema and there is no ascending cellulitis but there is no drainage or pus.  No fluctuation crepitation or any malodor. No other areas of tenderness to bilateral lower extremities.  No other open lesions or pre-ulcerative lesions.  No pain with calf compression, swelling, warmth, erythema.   Assessment and Plan:  Status post Left foot soft tissue mass excision, doing well with no complications   -Treatment options discussed including all alternatives, risks, and complications -I remove the sutures today without any complications.  Antibiotic ointment and a bandage was applied.  Keep the dressing clean and dry today.  She can remove tomorrow and start to shower.  Continue with surgical shoe for now but she is improving she can return to regular shoe.  Continue ice elevate. -Monitor for any clinical signs or symptoms of infection and directed to call the office immediately should any occur or go to the ER.  Trula Slade DPM

## 2019-04-18 ENCOUNTER — Encounter: Payer: Medicare Other | Admitting: Podiatry

## 2019-04-19 HISTORY — PX: BACK SURGERY: SHX140

## 2019-04-29 ENCOUNTER — Encounter: Payer: Medicare Other | Admitting: Podiatry

## 2019-05-03 DIAGNOSIS — Z09 Encounter for follow-up examination after completed treatment for conditions other than malignant neoplasm: Secondary | ICD-10-CM | POA: Insufficient documentation

## 2019-05-23 ENCOUNTER — Ambulatory Visit (INDEPENDENT_AMBULATORY_CARE_PROVIDER_SITE_OTHER): Payer: Medicare Other | Admitting: Podiatry

## 2019-05-23 ENCOUNTER — Ambulatory Visit (INDEPENDENT_AMBULATORY_CARE_PROVIDER_SITE_OTHER): Payer: Medicare Other

## 2019-05-23 ENCOUNTER — Other Ambulatory Visit: Payer: Self-pay

## 2019-05-23 VITALS — Temp 98.1°F | Resp 16

## 2019-05-23 DIAGNOSIS — M7752 Other enthesopathy of left foot: Secondary | ICD-10-CM

## 2019-05-23 DIAGNOSIS — M792 Neuralgia and neuritis, unspecified: Secondary | ICD-10-CM | POA: Diagnosis not present

## 2019-05-23 DIAGNOSIS — M779 Enthesopathy, unspecified: Secondary | ICD-10-CM | POA: Diagnosis not present

## 2019-05-23 MED ORDER — DICLOFENAC SODIUM 1 % TD GEL: 2.0000 g | Freq: Four times a day (QID) | TRANSDERMAL | 2 refills | Status: DC

## 2019-06-02 NOTE — Progress Notes (Signed)
Subjective: 73 year old female presents the office today for concerns of left foot pain on the medial aspect of her foot as well as burning.  Started over the weekend.  At max her pain level/burning is 8/10.  When it does occur it is becoming more consistent.  No recent injury.  No swelling.  She said no recent treatment. Denies any systemic complaints such as fevers, chills, nausea, vomiting. No acute changes since last appointment, and no other complaints at this time.   Objective: AAO x3, NAD There is mild tenderness to palpation along the medial aspect of the arch of the foot.  This is also where she gets the burning sensation.  There is no area pinpoint any tenderness or pain to vibratory sensation.  Minimal edema to this area.  No erythema or warmth.  Range of motion intact.  MMT 5/5.  The incision from the prior procedure is well-healed.    No open lesions or pre-ulcerative lesions.  No pain with calf compression, swelling, warmth, erythema  Assessment: Left foot burning sensation, tenderness  Plan: -All treatment options discussed with the patient including all alternatives, risks, complications.  -X-rays were obtained reviewed.  No evidence of acute fracture or structure. -Discuss stretching, icing to be performed daily.  Prescribed Voltaren gel.  Discussed wearing shoes with arch supports. -Patient encouraged to call the office with any questions, concerns, change in symptoms.   Trula Slade DPM

## 2019-06-10 ENCOUNTER — Ambulatory Visit (INDEPENDENT_AMBULATORY_CARE_PROVIDER_SITE_OTHER): Payer: Medicare Other | Admitting: Podiatry

## 2019-06-10 ENCOUNTER — Other Ambulatory Visit: Payer: Self-pay

## 2019-06-10 ENCOUNTER — Encounter: Payer: Self-pay | Admitting: Podiatry

## 2019-06-10 VITALS — Temp 97.7°F

## 2019-06-10 DIAGNOSIS — B351 Tinea unguium: Secondary | ICD-10-CM | POA: Diagnosis not present

## 2019-06-10 DIAGNOSIS — M792 Neuralgia and neuritis, unspecified: Secondary | ICD-10-CM

## 2019-06-10 DIAGNOSIS — M779 Enthesopathy, unspecified: Secondary | ICD-10-CM

## 2019-06-10 NOTE — Patient Instructions (Signed)
Toe Fracture Rehab Ask your health care provider which exercises are safe for you. Do exercises exactly as told by your health care provider and adjust them as directed. It is normal to feel mild stretching, pulling, tightness, or discomfort as you do these exercises, but you should stop right away if you feel sudden pain or your pain gets worse.Do not begin these exercises until told by your health care provider. Stretching and range of motion exercises These exercises warm up your muscles and joints and improve the movement and flexibility of your foot. These exercises also help to relieve pain, numbness, and tingling. Exercise A: Plantar flexionwith toe extension, seated 1. Sit on a chair that lets your feet rest flat on the floor. 2. Keeping your toes firmly on the ground, lift your left / right heel off the ground. You should feel a stretch under your toes. 3. Hold this position for __________ seconds. 4. Bring your heel back down to the floor. Repeat __________ times. Complete this exercise __________ times a day. Exercise B: Ankle alphabet  1. Sit with your left / right leg supported at the lower leg. ? Do not rest your foot on anything. ? Make sure your foot has room to move freely. 2. Think of your left / right foot as a paintbrush, and move your foot to trace each letter of the alphabet in the air. Keep your hip and knee still while you trace. Make the letters as large as you can without increasing any discomfort. 3. Trace every letter from A to Z. Repeat __________ times. Complete this exercise __________ times a day. Exercise C: Assisted toe flexion  1. Sit with your left / rightleg crossed over your opposite knee. 2. Hold your left / rightankle with your hand from the same side of your body. With your other hand, hold your toes. 3. Use the hand that is holding your toes to gently curl your toes down toward the bottom of your foot. You should feel a stretch on the top of your  toes and foot. 4. Hold this position for __________ seconds. Repeat __________ times. Complete this exercise __________ times a day. Strengthening exercise This exercise builds strength and endurance in your foot. Endurance is the ability to use your muscles for a long time, even after they get tired. Exercise D: Towel curls  1. Sit in a chair on a non-carpeted surface, and put your feet on the floor. 2. Place a towel in front of your feet. If told by your health care provider, add __________ to the end of the towel. 3. Keeping your heel on the floor, put your left / right foot on the towel. 4. Pull the towel toward you by grabbing the towel with your left / righttoes and curling them under. Keep your heel on the floor while you do this. 5. Let your toes relax. 6. Grab the towel again. Keep going until the towel is completely underneath your foot. Repeat __________ times. Complete this exercise __________ times a day. Balance exercise This exercise will help improve the control of your toe and foot when you are standing or walking. Exercise E: Single leg stand 1. Without shoes, stand near a railing or in a doorway. You may hold onto the railing or door frame as needed for balance. 2. Stand on your left / rightfoot. Keep your big toe down on the floor and try to keep your arch lifted. 3. Hold this position for __________ seconds. 4. If this exercise  is too easy, you can try doing it with your eyes closed or while standing on a pillow. Repeat __________ times. Complete this exercise __________ times a day. This information is not intended to replace advice given to you by your health care provider. Make sure you discuss any questions you have with your health care provider. Document Released: 12/05/2005 Document Revised: 08/11/2016 Document Reviewed: 08/11/2015 Elsevier Interactive Patient Education  2019 [employer].

## 2019-06-12 NOTE — Progress Notes (Signed)
Subjective: 73 year old female presents the office today for follow-up evaluation of left foot pain, burning.  She states that she was using Voltaren gel but it caused blister so she stopped.  Overall she states that her symptoms are improved.  She has very minimal discomfort she still points to the medial aspect of that she is now having a burning sensation.  She was asking if bone spurs in the back of the heel to be causing the issue.  She also states that the nail lacquer is been helping with a nail fungus.  The 2 big toenails are still discolored.  No pain in the nails no redness or drainage the toenail sites.  She also feels that her toes are tight on both sides.  She cannot move them like she used to.  Denies any systemic complaints such as fevers, chills, nausea, vomiting. No acute changes since last appointment, and no other complaints at this time.   Objective: AAO x3, NAD DP/PT pulses palpable bilaterally, CRT less than 3 seconds Overall nails have improved in color but they are still hypertrophic, dystrophic with discoloration with mild improvement.  There is no pain the nails there is no surrounding redness or drainage or any signs of infection noted today. On the left foot over where she was having a burning sensation is much improved.  There is some tenderness in the course of the flexor tendon today to the medial aspect of her foot along the insertion into the navicular tuberosity but overall very minimal minimal swelling there is no redness or warmth.  There is no area pinpoint tenderness identified today.  Hammertoes present No open lesions or pre-ulcerative lesions.  No pain with calf compression, swelling, warmth, erythema  Assessment: 73 year old female with resolving left foot burning sensation however tendinitis; onychomycosis  Plan: -All treatment options discussed with the patient including all alternatives, risks, complications.  -Overall her symptoms are much improved.  We  discussed changing shoes as well as inserts to help take pressure off the posterior tibial tendon as this is where she is having discomfort on the insertion.  Discussed ice to the area as well and also general stretching exercises, rehab.  Although she has bone spurs in the back of the heel do not think this was causing pain we discussed. -Continue nail lacquer for nail fungus.  As a courtesy I debrided the nails that any complications or bleeding. -I gave her toe exercises for rehab after toe fracture but this can help with her range of motion to her digits as well. -Patient encouraged to call the office with any questions, concerns, change in symptoms.   Trula Slade DPM

## 2019-08-12 ENCOUNTER — Ambulatory Visit: Payer: Medicare Other | Admitting: Podiatry

## 2019-09-18 ENCOUNTER — Telehealth: Payer: Self-pay | Admitting: Gastroenterology

## 2019-09-18 NOTE — Telephone Encounter (Signed)
Dr. Lyndel Safe, this is a former pt of Dr. Laureen Ochs seen in 2015.  She had transferred care and was seen by Dr. Marye Round at [facility] in March 2020 (in [place] for review.)  Pt requested a GI MD in [place].  She is referred for a colonoscopy.  Will you accept this pt?

## 2019-09-19 NOTE — Telephone Encounter (Signed)
No problems at all We will be more than happy to RG

## 2019-09-26 ENCOUNTER — Encounter: Payer: Self-pay | Admitting: Gastroenterology

## 2019-10-03 DIAGNOSIS — Z720 Tobacco use: Secondary | ICD-10-CM | POA: Insufficient documentation

## 2019-10-03 DIAGNOSIS — Z7982 Long term (current) use of aspirin: Secondary | ICD-10-CM | POA: Insufficient documentation

## 2019-10-18 ENCOUNTER — Telehealth: Payer: Self-pay | Admitting: *Deleted

## 2019-10-18 NOTE — Telephone Encounter (Signed)
Pt no show PV, called patient. No answer, left message for the patient to call us back today before 5 pm to reschedule the PV or the colon would be cancelled.

## 2019-10-18 NOTE — Telephone Encounter (Signed)
The patient cancelled both PV and colon at this time per Trenton Gammon.

## 2019-10-21 ENCOUNTER — Encounter: Payer: Self-pay | Admitting: Internal Medicine

## 2019-11-01 ENCOUNTER — Encounter: Payer: Self-pay | Admitting: *Deleted

## 2019-11-01 ENCOUNTER — Ambulatory Visit (INDEPENDENT_AMBULATORY_CARE_PROVIDER_SITE_OTHER): Payer: Medicare Other | Admitting: Cardiology

## 2019-11-01 ENCOUNTER — Other Ambulatory Visit: Payer: Self-pay

## 2019-11-01 ENCOUNTER — Encounter: Payer: Medicare Other | Admitting: Gastroenterology

## 2019-11-01 VITALS — BP 120/80 | HR 71 | Ht 61.0 in | Wt 167.0 lb

## 2019-11-01 DIAGNOSIS — R0600 Dyspnea, unspecified: Secondary | ICD-10-CM

## 2019-11-01 DIAGNOSIS — R079 Chest pain, unspecified: Secondary | ICD-10-CM

## 2019-11-01 DIAGNOSIS — E782 Mixed hyperlipidemia: Secondary | ICD-10-CM

## 2019-11-01 DIAGNOSIS — I10 Essential (primary) hypertension: Secondary | ICD-10-CM

## 2019-11-01 DIAGNOSIS — R7303 Prediabetes: Secondary | ICD-10-CM | POA: Diagnosis not present

## 2019-11-01 NOTE — Progress Notes (Signed)
Cardiology Office Note:    Date:  11/01/2019   ID:  Summer Jackson, DOB January 21, 1946, MRN [id number]  PCP:  Bonnita Nasuti, MD  Cardiologist:  Berniece Salines, DO  Electrophysiologist:  None   Referring MD: Bonnita Nasuti, MD   Chief Complaint  Patient presents with  . Shortness of Breath    History of Present Illness:    Summer Jackson is a 73 y.o. female with a hx of hypertension,hyperlidemia, former smoker presents to be evaluated for chest pain. She tells me that she has been experiencing left sided chest pressure for a couple of months. She admits to associated shortness of breath. The patient tells me that her pain is intermittent and when it is occurring it last for about 30-40 minutes. No other complaints at this time.   Past Medical History:  Diagnosis Date  . Allergy to pollen 05/29/2013  . Arthritis    "knees" (05/09/2013)  . Asthma   . Blood transfusion ~ 1955   "before heart OR" (05/09/2013)  . Chronic cough 01/24/2013  . COMMON MIGRAINE 08/09/2007   Qualifier: Diagnosis of  By: Radene Ou MD, [place]    . 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 Constipation 05/29/2013  . COPD (chronic obstructive pulmonary disease) ([place])   . Exertional shortness of breath   . Foreign body sensation in throat 12/16/2016  . Gastroesophageal reflux disease with esophagitis 12/16/2016  . GERD (gastroesophageal reflux disease)   . Headache 03/20/2018   migraine Overview:  Overview:  migraine migraine  . Headache(784.0)    migraine   . Hemorrhoids   . History of stomach ulcers 1990's  . Hyperlipemia   . HYPERLIPIDEMIA 08/25/2007   Qualifier: Diagnosis of  By: Radene Ou MD, [place]    . Hypertension   . Hypertension   . HYPERTENSION 08/09/2007   Qualifier: Diagnosis of  By: Radene Ou MD, [place]    . Idiopathic peripheral autonomic neuropathy 03/27/2019  . Left lower quadrant pain 09/05/2018  . Left shoulder pain  02/05/2013  . Lipoma of abdominal wall 01/23/2012  . Migraines   . Nasal congestion   . Nicotine dependence 11/23/2018  . Pharyngeal dysphagia 12/16/2016  . Prediabetes 01/10/2013  . Rectal bleeding   . Scapular dyskinesis 03/26/2018  . SHINGLES, RECURRENT 09/30/2008   Qualifier: Diagnosis of  By: Radene Ou MD, [place]    . Spondylolisthesis, lumbar region 04/10/2019   Added automatically from request for surgery [phone]  . Sternal pain 03/26/2018  . Synovial cyst of lumbar facet joint 04/08/2019  . Urinary frequency   . Weight loss   . Wheezing     Past Surgical History:  Procedure Laterality Date  . APPENDECTOMY  1970's  . COLONOSCOPY    . COLONOSCOPY  12/22/2011   Procedure: COLONOSCOPY;  Surgeon: Lear Ng, MD;  Location: [facility];  Service: Endoscopy;  Laterality: N/A;  . heart surgery at 42yr  1955   "blood was going thru my lungs but not thru my heart like it should" (05/09/2013)  . KNEE ARTHROSCOPY Left ~ 2004; ~ 2006  . LEFT OOPHORECTOMY Left ~ 1985  . LIPOMA EXCISION  02/15/2012   Procedure: EXCISION LIPOMA;  Surgeon: Joyice Faster. Cornett, MD;  Location: [place];  Service: General;  Laterality: Left;  excision lipoma Left Lower abdomen  . TOTAL KNEE ARTHROPLASTY Left 05/08/2013  . TOTAL KNEE ARTHROPLASTY Left 05/08/2013   Procedure:  TOTAL KNEE ARTHROPLASTY;  Surgeon: Ninetta Lights, MD;  Location: [place];  Service: Orthopedics;  Laterality: Left;  . TUBAL LIGATION Left ~ 1985  . TUMOR EXCISION Left ~ 1984   "under breast" (05/09/2013)    Current Medications: Current Meds  Medication Sig  . albuterol (PROVENTIL HFA;VENTOLIN HFA) 108 (90 BASE) MCG/ACT inhaler Inhale 2 puffs into the lungs every 6 (six) hours as needed for wheezing (wheezing). Shortness of breath  . albuterol (PROVENTIL) (2.5 MG/3ML) 0.083% nebulizer solution Take 3 mLs (2.5 mg total) by nebulization every 6 (six) hours as needed for wheezing or shortness of breath.  Marland Kitchen aspirin EC 81 MG  tablet Take 81 mg by mouth daily.  . budesonide-formoterol (SYMBICORT) 80-4.5 MCG/ACT inhaler Symbicort 80 mcg-4.5 mcg/actuation HFA aerosol inhaler  . ciclopirox (PENLAC) 8 % solution   . cyclobenzaprine (FLEXERIL) 10 MG tablet Take 10 mg by mouth at bedtime as needed.  . folic acid (FOLVITE) 1 MG tablet Take 1 mg by mouth daily.  Marland Kitchen ipratropium (ATROVENT) 0.06 % nasal spray Place 2 sprays into both nostrils 4 (four) times daily.  Marland Kitchen lisinopril-hydrochlorothiazide (ZESTORETIC) 20-12.5 MG tablet Take 1 tablet by mouth daily.  Marland Kitchen ofloxacin (FLOXIN) 0.3 % OTIC solution SMARTSIG:10 Drop(s) In Ear(s) Daily  . OMEPRAZOLE PO Take by mouth.  . ondansetron (ZOFRAN-ODT) 4 MG disintegrating tablet   . prednisoLONE acetate (PRED FORTE) 1 % ophthalmic suspension INSTILL 1 DROP INTO EACH EYE 4 TIMES DAILY FOR 7 DAYS THEN ONE DROP TWICE DAILY FOR ONE WEEK THEN STOP  . simvastatin (ZOCOR) 20 MG tablet Take 20 mg by mouth at bedtime.  . sucralfate (CARAFATE) 1 g tablet      Allergies:   Sulfa antibiotics, Amoxicillin, Azithromycin, Penicillins, Propoxyphene n-acetaminophen, and Clarithromycin   Social History   Socioeconomic History  . Marital status: Divorced    Spouse name: Not on file  . Number of children: 2  . Years of education: Not on file  . Highest education level: Not on file  Occupational History  . Occupation: cleaner    Comment: cleans apts and homes  Social Needs  . Financial resource strain: Not on file  . Food insecurity    Worry: Not on file    Inability: Not on file  . Transportation needs    Medical: Not on file    Non-medical: Not on file  Tobacco Use  . Smoking status: Former Smoker    Packs/day: 2.00    Years: 35.00    Pack years: 70.00    Types: Cigarettes    Quit date: 08/20/2013    Years since quitting: 6.2  . Smokeless tobacco: Never Used  . Tobacco comment: Currently exposed to 2nd hand smoke in home   Substance and Sexual Activity  . Alcohol use: No  . Drug use:  No  . Sexual activity: Not on file  Lifestyle  . Physical activity    Days per week: Not on file    Minutes per session: Not on file  . Stress: Not on file  Relationships  . Social Herbalist on phone: Not on file    Gets together: Not on file    Attends religious service: Not on file    Active member of club or organization: Not on file    Attends meetings of clubs or organizations: Not on file    Relationship status: Not on file  Other Topics Concern  . Not on file  Social History Narrative  Lives with her daughter's father.     Family History: The patient's family history includes Cancer in her father; Colon cancer in her father; Diabetes in her sister; Heart disease in her father and paternal uncle. There is no history of Malignant hyperthermia.  ROS:   Review of Systems  Constitution: Negative for decreased appetite, fever and weight gain.  HENT: Negative for congestion, ear discharge, hoarse voice and sore throat.   Eyes: Negative for discharge, redness, vision loss in right eye and visual halos.  Cardiovascular: Negative for chest pain, dyspnea on exertion, leg swelling, orthopnea and palpitations.  Respiratory: Negative for cough, hemoptysis, shortness of breath and snoring.   Endocrine: Negative for heat intolerance and polyphagia.  Hematologic/Lymphatic: Negative for bleeding problem. Does not bruise/bleed easily.  Skin: Negative for flushing, nail changes, rash and suspicious lesions.  Musculoskeletal: Negative for arthritis, joint pain, muscle cramps, myalgias, neck pain and stiffness.  Gastrointestinal: Negative for abdominal pain, bowel incontinence, diarrhea and excessive appetite.  Genitourinary: Negative for decreased libido, genital sores and incomplete emptying.  Neurological: Negative for brief paralysis, focal weakness, headaches and loss of balance.  Psychiatric/Behavioral: Negative for altered mental status, depression and suicidal ideas.   Allergic/Immunologic: Negative for HIV exposure and persistent infections.    EKGs/Labs/Other Studies Reviewed:    The following studies were reviewed today:   EKG:  The ekg ordered today demonstrates   Recent Labs: No results found for requested labs within last 8760 hours.  Recent Lipid Panel    Component Value Date/Time   CHOL 277 (H) 04/30/2008 2259   TRIG 154 (H) 04/30/2008 2259   HDL 55 04/30/2008 2259   CHOLHDL 5.0 Ratio 04/30/2008 2259   VLDL 31 04/30/2008 2259   LDLCALC 191 (H) 04/30/2008 2259    Physical Exam:    VS:  BP 120/80 (BP Location: Right Arm, Patient Position: Sitting, Cuff Size: Normal)   Pulse 71   Ht 5\' 1"  (1.549 m)   Wt 167 lb (75.8 kg)   SpO2 96%   BMI 31.55 kg/m     Wt Readings from Last 3 Encounters:  11/01/19 167 lb (75.8 kg)  02/09/16 183 lb (83 kg)  10/25/15 189 lb 6.4 oz (85.9 kg)     GEN: Well nourished, well developed in no acute distress HEENT: Normal NECK: No JVD; No carotid bruits LYMPHATICS: No lymphadenopathy CARDIAC: S1S2 noted,RRR, no murmurs, rubs, gallops RESPIRATORY:  Clear to auscultation without rales, wheezing or rhonchi  ABDOMEN: Soft, non-tender, non-distended, +bowel sounds, no guarding. EXTREMITIES: No edema, No cyanosis, no clubbing MUSCULOSKELETAL:  No edema; No deformity  SKIN: Warm and dry NEUROLOGIC:  Alert and oriented x 3, non-focal PSYCHIATRIC:  Normal affect, good insight  ASSESSMENT:    1. Chest pain, unspecified type   2. Hypertension, unspecified type   3. Dyspnea, unspecified type   4. Prediabetes   5. Mixed hyperlipidemia    PLAN:    1. Her chest pain is concerning, given her risk factors. Therefore at this time I will pursue her ischemic evaluation. I have educated the patient about a pharmacologic nuclear testing. All of her questions have been answered and she is willing to proceed with this testing.   2. A transthoracic echocardiogram has been ordered to assess RV/LV function and any  other structural abnormalities.   3. Hyperlipidemia - continue simvastatin.   4. I will like to get lipid panel today - but the patient reports that she recently had blood work at her pcp, she will  have this send to me.   The patient is in agreement with the above plan. The patient left the office in stable condition.  The patient will follow up in 3 months.   Medication Adjustments/Labs and Tests Ordered: Current medicines are reviewed at length with the patient today.  Concerns regarding medicines are outlined above.  Orders Placed This Encounter  Procedures  . MYOCARDIAL PERFUSION IMAGING  . EKG 12-Lead  . ECHOCARDIOGRAM COMPLETE   No orders of the defined types were placed in this encounter.   Patient Instructions  Medication Instructions:  Your physician recommends that you continue on your current medications as directed. Please refer to the Current Medication list given to you today.  *If you need a refill on your cardiac medications before your next appointment, please call your pharmacy*  Lab Work: NOne If you have labs (blood work) drawn today and your tests are completely normal, you will receive your results only by: Marland Kitchen MyChart Message (if you have MyChart) OR . A paper copy in the mail If you have any lab test that is abnormal or we need to change your treatment, we will call you to review the results.  Testing/Procedures: Your physician has requested that you have an echocardiogram. Echocardiography is a painless test that uses sound waves to create images of your heart. It provides your doctor with information about the size and shape of your heart and how well your heart's chambers and valves are working. This procedure takes approximately one hour. There are no restrictions for this procedure.  Your physician has requested that you have a lexiscan myoview. For further information please visit [email or web address]. Please follow instruction sheet, as given.    Follow-Up: At [facility], you and your health needs are our priority.  As part of our continuing mission to provide you with exceptional heart care, we have created designated Provider Care Teams.  These Care Teams include your primary Cardiologist (physician) and Advanced Practice Providers (APPs -  Physician Assistants and Nurse Practitioners) who all work together to provide you with the care you need, when you need it.  Your next appointment:   3 months  The format for your next appointment:   In Person  Provider:   Berniece Salines, DO  Other Instructions  Cardiac Nuclear Scan A cardiac nuclear scan is a test that is done to check the flow of blood to your heart. It is done when you are resting and when you are exercising. The test looks for problems such as:  Not enough blood reaching a portion of the heart.  The heart muscle not working as it should. You may need this test if:  You have heart disease.  You have had lab results that are not normal.  You have had heart surgery or a balloon procedure to open up blocked arteries (angioplasty).  You have chest pain.  You have shortness of breath. In this test, a special dye (tracer) is put into your bloodstream. The tracer will travel to your heart. A camera will then take pictures of your heart to see how the tracer moves through your heart. This test is usually done at a hospital and takes 2-4 hours. Tell a doctor about:  Any allergies you have.  All medicines you are taking, including vitamins, herbs, eye drops, creams, and over-the-counter medicines.  Any problems you or family members have had with anesthetic medicines.  Any blood disorders you have.  Any surgeries you have had.  Any medical  conditions you have.  Whether you are pregnant or may be pregnant. What are the risks? Generally, this is a safe test. However, problems may occur, such as:  Serious chest pain and heart attack. This is only a risk if the  stress portion of the test is done.  Rapid heartbeat.  A feeling of warmth in your chest. This feeling usually does not last long.  Allergic reaction to the tracer. What happens before the test?  Ask your doctor about changing or stopping your normal medicines. This is important.  Follow instructions from your doctor about what you cannot eat or drink.  Remove your jewelry on the day of the test. What happens during the test?  An IV tube will be inserted into one of your veins.  Your doctor will give you a small amount of tracer through the IV tube.  You will wait for 20-40 minutes while the tracer moves through your bloodstream.  Your heart will be monitored with an electrocardiogram (ECG).  You will lie down on an exam table.  Pictures of your heart will be taken for about 15-20 minutes.  You may also have a stress test. For this test, one of these things may be done: ? You will be asked to exercise on a treadmill or a stationary bike. ? You will be given medicines that will make your heart work harder. This is done if you are unable to exercise.  When blood flow to your heart has peaked, a tracer will again be given through the IV tube.  After 20-40 minutes, you will get back on the exam table. More pictures will be taken of your heart.  Depending on the tracer that is used, more pictures may need to be taken 3-4 hours later.  Your IV tube will be removed when the test is over. The test may vary among doctors and hospitals. What happens after the test?  Ask your doctor: ? Whether you can return to your normal schedule, including diet, activities, and medicines. ? Whether you should drink more fluids. This will help to remove the tracer from your body. Drink enough fluid to keep your pee (urine) pale yellow.  Ask your doctor, or the department that is doing the test: ? When will my results be ready? ? How will I get my results? Summary  A cardiac nuclear scan is a  test that is done to check the flow of blood to your heart.  Tell your doctor whether you are pregnant or may be pregnant.  Before the test, ask your doctor about changing or stopping your normal medicines. This is important.  Ask your doctor whether you can return to your normal activities. You may be asked to drink more fluids. This information is not intended to replace advice given to you by your health care provider. Make sure you discuss any questions you have with your health care provider. Document Released: 05/21/2018 Document Revised: 03/27/2019 Document Reviewed: 05/21/2018 Elsevier Patient Education  [employer].  Echocardiogram An echocardiogram is a procedure that uses painless sound waves (ultrasound) to produce an image of the heart. Images from an echocardiogram can provide important information about:  Signs of coronary artery disease (CAD).  Aneurysm detection. An aneurysm is a weak or damaged part of an artery wall that bulges out from the normal force of blood pumping through the body.  Heart size and shape. Changes in the size or shape of the heart can be associated with certain conditions,  including heart failure, aneurysm, and CAD.  Heart muscle function.  Heart valve function.  Signs of a past heart attack.  Fluid buildup around the heart.  Thickening of the heart muscle.  A tumor or infectious growth around the heart valves. Tell a health care provider about:  Any allergies you have.  All medicines you are taking, including vitamins, herbs, eye drops, creams, and over-the-counter medicines.  Any blood disorders you have.  Any surgeries you have had.  Any medical conditions you have.  Whether you are pregnant or may be pregnant. What are the risks? Generally, this is a safe procedure. However, problems may occur, including:  Allergic reaction to dye (contrast) that may be used during the procedure. What happens before the procedure? No  specific preparation is needed. You may eat and drink normally. What happens during the procedure?   An IV tube may be inserted into one of your veins.  You may receive contrast through this tube. A contrast is an injection that improves the quality of the pictures from your heart.  A gel will be applied to your chest.  A wand-like tool (transducer) will be moved over your chest. The gel will help to transmit the sound waves from the transducer.  The sound waves will harmlessly bounce off of your heart to allow the heart images to be captured in real-time motion. The images will be recorded on a computer. The procedure may vary among health care providers and hospitals. What happens after the procedure?  You may return to your normal, everyday life, including diet, activities, and medicines, unless your health care provider tells you not to do that. Summary  An echocardiogram is a procedure that uses painless sound waves (ultrasound) to produce an image of the heart.  Images from an echocardiogram can provide important information about the size and shape of your heart, heart muscle function, heart valve function, and fluid buildup around your heart.  You do not need to do anything to prepare before this procedure. You may eat and drink normally.  After the echocardiogram is completed, you may return to your normal, everyday life, unless your health care provider tells you not to do that. This information is not intended to replace advice given to you by your health care provider. Make sure you discuss any questions you have with your health care provider. Document Released: 12/02/2000 Document Revised: 03/28/2019 Document Reviewed: 01/07/2017 Elsevier Patient Education  2020 [employer].        Adopting a Healthy Lifestyle.  Know what a healthy weight is for you (roughly BMI <25) and aim to maintain this   Aim for 7+ servings of fruits and vegetables daily   65-80+ fluid  ounces of water or unsweet tea for healthy kidneys   Limit to max 1 drink of alcohol per day; avoid smoking/tobacco   Limit animal fats in diet for cholesterol and heart health - choose grass fed whenever available   Avoid highly processed foods, and foods high in saturated/trans fats   Aim for low stress - take time to unwind and care for your mental health   Aim for 150 min of moderate intensity exercise weekly for heart health, and weights twice weekly for bone health   Aim for 7-9 hours of sleep daily   When it comes to diets, agreement about the perfect plan isnt easy to find, even among the experts. Experts at the [employer] developed an idea known as the Healthy  Eating Plate. Just imagine a plate divided into logical, healthy portions.   The emphasis is on diet quality:   Load up on vegetables and fruits - one-half of your plate: Aim for color and variety, and remember that potatoes dont count.   Go for whole grains - one-quarter of your plate: Whole wheat, barley, wheat berries, quinoa, oats, brown rice, and foods made with them. If you want pasta, go with whole wheat pasta.   Protein power - one-quarter of your plate: Fish, chicken, beans, and nuts are all healthy, versatile protein sources. Limit red meat.   The diet, however, does go beyond the plate, offering a few other suggestions.   Use healthy plant oils, such as olive, canola, soy, corn, sunflower and peanut. Check the labels, and avoid partially hydrogenated oil, which have unhealthy trans fats.   If youre thirsty, drink water. Coffee and tea are good in moderation, but skip sugary drinks and limit milk and dairy products to one or two daily servings.   The type of carbohydrate in the diet is more important than the amount. Some sources of carbohydrates, such as vegetables, fruits, whole grains, and beans-are healthier than others.   Finally, stay active  Signed, Berniece Salines, DO   11/01/2019 1:11 PM    [facility]

## 2019-11-01 NOTE — Patient Instructions (Addendum)
Medication Instructions:  Your physician recommends that you continue on your current medications as directed. Please refer to the Current Medication list given to you today.  *If you need a refill on your cardiac medications before your next appointment, please call your pharmacy*  Lab Work: NOne If you have labs (blood work) drawn today and your tests are completely normal, you will receive your results only by: Marland Kitchen MyChart Message (if you have MyChart) OR . A paper copy in the mail If you have any lab test that is abnormal or we need to change your treatment, we will call you to review the results.  Testing/Procedures: Your physician has requested that you have an echocardiogram. Echocardiography is a painless test that uses sound waves to create images of your heart. It provides your doctor with information about the size and shape of your heart and how well your heart's chambers and valves are working. This procedure takes approximately one hour. There are no restrictions for this procedure.  Your physician has requested that you have a lexiscan myoview. For further information please visit [email or web address]. Please follow instruction sheet, as given.   Follow-Up: At [facility], you and your health needs are our priority.  As part of our continuing mission to provide you with exceptional heart care, we have created designated Provider Care Teams.  These Care Teams include your primary Cardiologist (physician) and Advanced Practice Providers (APPs -  Physician Assistants and Nurse Practitioners) who all work together to provide you with the care you need, when you need it.  Your next appointment:   3 months  The format for your next appointment:   In Person  Provider:   Berniece Salines, DO  Other Instructions  Cardiac Nuclear Scan A cardiac nuclear scan is a test that is done to check the flow of blood to your heart. It is done when you are resting and when you are exercising.  The test looks for problems such as:  Not enough blood reaching a portion of the heart.  The heart muscle not working as it should. You may need this test if:  You have heart disease.  You have had lab results that are not normal.  You have had heart surgery or a balloon procedure to open up blocked arteries (angioplasty).  You have chest pain.  You have shortness of breath. In this test, a special dye (tracer) is put into your bloodstream. The tracer will travel to your heart. A camera will then take pictures of your heart to see how the tracer moves through your heart. This test is usually done at a hospital and takes 2-4 hours. Tell a doctor about:  Any allergies you have.  All medicines you are taking, including vitamins, herbs, eye drops, creams, and over-the-counter medicines.  Any problems you or family members have had with anesthetic medicines.  Any blood disorders you have.  Any surgeries you have had.  Any medical conditions you have.  Whether you are pregnant or may be pregnant. What are the risks? Generally, this is a safe test. However, problems may occur, such as:  Serious chest pain and heart attack. This is only a risk if the stress portion of the test is done.  Rapid heartbeat.  A feeling of warmth in your chest. This feeling usually does not last long.  Allergic reaction to the tracer. What happens before the test?  Ask your doctor about changing or stopping your normal medicines. This is important.  Follow instructions  from your doctor about what you cannot eat or drink.  Remove your jewelry on the day of the test. What happens during the test?  An IV tube will be inserted into one of your veins.  Your doctor will give you a small amount of tracer through the IV tube.  You will wait for 20-40 minutes while the tracer moves through your bloodstream.  Your heart will be monitored with an electrocardiogram (ECG).  You will lie down on an  exam table.  Pictures of your heart will be taken for about 15-20 minutes.  You may also have a stress test. For this test, one of these things may be done: ? You will be asked to exercise on a treadmill or a stationary bike. ? You will be given medicines that will make your heart work harder. This is done if you are unable to exercise.  When blood flow to your heart has peaked, a tracer will again be given through the IV tube.  After 20-40 minutes, you will get back on the exam table. More pictures will be taken of your heart.  Depending on the tracer that is used, more pictures may need to be taken 3-4 hours later.  Your IV tube will be removed when the test is over. The test may vary among doctors and hospitals. What happens after the test?  Ask your doctor: ? Whether you can return to your normal schedule, including diet, activities, and medicines. ? Whether you should drink more fluids. This will help to remove the tracer from your body. Drink enough fluid to keep your pee (urine) pale yellow.  Ask your doctor, or the department that is doing the test: ? When will my results be ready? ? How will I get my results? Summary  A cardiac nuclear scan is a test that is done to check the flow of blood to your heart.  Tell your doctor whether you are pregnant or may be pregnant.  Before the test, ask your doctor about changing or stopping your normal medicines. This is important.  Ask your doctor whether you can return to your normal activities. You may be asked to drink more fluids. This information is not intended to replace advice given to you by your health care provider. Make sure you discuss any questions you have with your health care provider. Document Released: 05/21/2018 Document Revised: 03/27/2019 Document Reviewed: 05/21/2018 Elsevier Patient Education  [place].  Echocardiogram An echocardiogram is a procedure that uses painless sound waves (ultrasound) to  produce an image of the heart. Images from an echocardiogram can provide important information about:  Signs of coronary artery disease (CAD).  Aneurysm detection. An aneurysm is a weak or damaged part of an artery wall that bulges out from the normal force of blood pumping through the body.  Heart size and shape. Changes in the size or shape of the heart can be associated with certain conditions, including heart failure, aneurysm, and CAD.  Heart muscle function.  Heart valve function.  Signs of a past heart attack.  Fluid buildup around the heart.  Thickening of the heart muscle.  A tumor or infectious growth around the heart valves. Tell a health care provider about:  Any allergies you have.  All medicines you are taking, including vitamins, herbs, eye drops, creams, and over-the-counter medicines.  Any blood disorders you have.  Any surgeries you have had.  Any medical conditions you have.  Whether you are pregnant or may be  pregnant. What are the risks? Generally, this is a safe procedure. However, problems may occur, including:  Allergic reaction to dye (contrast) that may be used during the procedure. What happens before the procedure? No specific preparation is needed. You may eat and drink normally. What happens during the procedure?   An IV tube may be inserted into one of your veins.  You may receive contrast through this tube. A contrast is an injection that improves the quality of the pictures from your heart.  A gel will be applied to your chest.  A wand-like tool (transducer) will be moved over your chest. The gel will help to transmit the sound waves from the transducer.  The sound waves will harmlessly bounce off of your heart to allow the heart images to be captured in real-time motion. The images will be recorded on a computer. The procedure may vary among health care providers and hospitals. What happens after the procedure?  You may return to  your normal, everyday life, including diet, activities, and medicines, unless your health care provider tells you not to do that. Summary  An echocardiogram is a procedure that uses painless sound waves (ultrasound) to produce an image of the heart.  Images from an echocardiogram can provide important information about the size and shape of your heart, heart muscle function, heart valve function, and fluid buildup around your heart.  You do not need to do anything to prepare before this procedure. You may eat and drink normally.  After the echocardiogram is completed, you may return to your normal, everyday life, unless your health care provider tells you not to do that. This information is not intended to replace advice given to you by your health care provider. Make sure you discuss any questions you have with your health care provider. Document Released: 12/02/2000 Document Revised: 03/28/2019 Document Reviewed: 01/07/2017 Elsevier Patient Education  2020 [employer].

## 2019-11-04 ENCOUNTER — Telehealth: Payer: Self-pay | Admitting: Cardiology

## 2019-11-04 NOTE — Telephone Encounter (Signed)
Patient called this am and her shoulder is really hurting worse since she saw Dr. Harriet Masson.Summer Jackson

## 2019-11-04 NOTE — Telephone Encounter (Signed)
Returned patient call. States right arm is hurting and back is hurting. Wants to be called if nuclear and echo appointments open up in [place]. Currently scheduled for 11/13/19 for both at [place] this is probably the quickest she will be able to get in but will look into it. Spoke with DR tobb about the arm pain. She doen't believe it is cardiac.

## 2019-11-05 ENCOUNTER — Ambulatory Visit: Payer: Medicare Other | Admitting: Cardiology

## 2019-11-07 ENCOUNTER — Telehealth (HOSPITAL_COMMUNITY): Payer: Self-pay | Admitting: *Deleted

## 2019-11-07 NOTE — Telephone Encounter (Signed)
Left message on voicemail in reference to upcoming appointment scheduled for 11/13/19. Phone number given for a call back so details instructions can be given.  Kirstie Peri

## 2019-11-11 ENCOUNTER — Telehealth (HOSPITAL_COMMUNITY): Payer: Self-pay | Admitting: *Deleted

## 2019-11-11 NOTE — Telephone Encounter (Signed)
Left message on voicemail in reference to upcoming appointment scheduled for 11/13/19. Phone number given for a call back so details instructions can be given.  Summer Jackson

## 2019-11-13 ENCOUNTER — Ambulatory Visit (HOSPITAL_BASED_OUTPATIENT_CLINIC_OR_DEPARTMENT_OTHER): Payer: Medicare Other

## 2019-11-13 ENCOUNTER — Other Ambulatory Visit: Payer: Self-pay

## 2019-11-13 ENCOUNTER — Ambulatory Visit (HOSPITAL_COMMUNITY): Payer: Medicare Other | Attending: Cardiovascular Disease

## 2019-11-13 VITALS — Ht 61.0 in | Wt 167.0 lb

## 2019-11-13 DIAGNOSIS — R0600 Dyspnea, unspecified: Secondary | ICD-10-CM | POA: Diagnosis not present

## 2019-11-13 DIAGNOSIS — R079 Chest pain, unspecified: Secondary | ICD-10-CM | POA: Insufficient documentation

## 2019-11-13 DIAGNOSIS — I10 Essential (primary) hypertension: Secondary | ICD-10-CM | POA: Insufficient documentation

## 2019-11-13 LAB — MYOCARDIAL PERFUSION IMAGING
LV dias vol: 51 mL (ref 46–106)
LV sys vol: 7 mL
Peak HR: 80 {beats}/min
Rest HR: 67 {beats}/min
SDS: 0
SRS: 0
SSS: 0
TID: 0.93

## 2019-11-13 LAB — ECHOCARDIOGRAM COMPLETE
Height: 61 in
Weight: 2672 oz

## 2019-11-13 MED ORDER — TECHNETIUM TC 99M TETROFOSMIN IV KIT
10.9000 | PACK | Freq: Once | INTRAVENOUS | Status: AC | PRN
  Administered 2019-11-13: 10.9 via INTRAVENOUS
  Filled 2019-11-13: qty 11

## 2019-11-13 MED ORDER — TECHNETIUM TC 99M TETROFOSMIN IV KIT
32.2000 | PACK | Freq: Once | INTRAVENOUS | Status: AC | PRN
  Administered 2019-11-13: 32.2 via INTRAVENOUS
  Filled 2019-11-13: qty 33

## 2019-11-13 MED ORDER — REGADENOSON 0.4 MG/5ML IV SOLN
0.4000 mg | Freq: Once | INTRAVENOUS | Status: AC
  Administered 2019-11-13: 0.4 mg via INTRAVENOUS

## 2019-11-25 ENCOUNTER — Ambulatory Visit: Payer: Medicare Other | Admitting: Cardiology

## 2019-12-03 ENCOUNTER — Encounter: Payer: Self-pay | Admitting: Allergy

## 2019-12-03 ENCOUNTER — Ambulatory Visit (INDEPENDENT_AMBULATORY_CARE_PROVIDER_SITE_OTHER): Payer: Medicare Other | Admitting: Allergy

## 2019-12-03 ENCOUNTER — Other Ambulatory Visit: Payer: Self-pay

## 2019-12-03 ENCOUNTER — Ambulatory Visit (INDEPENDENT_AMBULATORY_CARE_PROVIDER_SITE_OTHER): Payer: Medicare Other | Admitting: Cardiology

## 2019-12-03 ENCOUNTER — Encounter: Payer: Self-pay | Admitting: Cardiology

## 2019-12-03 VITALS — BP 124/70 | HR 75 | Ht 61.0 in | Wt 173.0 lb

## 2019-12-03 VITALS — BP 116/58 | HR 76 | Temp 97.9°F | Resp 18 | Ht 61.4 in | Wt 174.8 lb

## 2019-12-03 DIAGNOSIS — J449 Chronic obstructive pulmonary disease, unspecified: Secondary | ICD-10-CM | POA: Diagnosis not present

## 2019-12-03 DIAGNOSIS — E782 Mixed hyperlipidemia: Secondary | ICD-10-CM | POA: Diagnosis not present

## 2019-12-03 DIAGNOSIS — R079 Chest pain, unspecified: Secondary | ICD-10-CM

## 2019-12-03 DIAGNOSIS — H1013 Acute atopic conjunctivitis, bilateral: Secondary | ICD-10-CM

## 2019-12-03 DIAGNOSIS — I779 Disorder of arteries and arterioles, unspecified: Secondary | ICD-10-CM | POA: Diagnosis not present

## 2019-12-03 DIAGNOSIS — J4489 Other specified chronic obstructive pulmonary disease: Secondary | ICD-10-CM

## 2019-12-03 DIAGNOSIS — I10 Essential (primary) hypertension: Secondary | ICD-10-CM

## 2019-12-03 DIAGNOSIS — J3089 Other allergic rhinitis: Secondary | ICD-10-CM

## 2019-12-03 MED ORDER — ALBUTEROL SULFATE HFA 108 (90 BASE) MCG/ACT IN AERS: INHALATION_SPRAY | RESPIRATORY_TRACT | 1 refills | Status: AC

## 2019-12-03 MED ORDER — IPRATROPIUM BROMIDE 0.06 % NA SOLN: NASAL | 5 refills | Status: DC

## 2019-12-03 MED ORDER — ISOSORBIDE MONONITRATE ER 30 MG PO TB24: 30.0000 mg | ORAL_TABLET | Freq: Every day | ORAL | 1 refills | Status: DC

## 2019-12-03 NOTE — Progress Notes (Signed)
Cardiology Office Note:    Date:  12/03/2019   ID:  Summer Jackson, DOB [birth year], MRN [id number]  PCP:  Bonnita Nasuti, MD  Cardiologist:  Berniece Salines, DO  Electrophysiologist:  None   Referring MD: Bonnita Nasuti, MD   Chief Complaint  Patient presents with  . Chest Pain    History of Present Illness:    Summer Jackson is a 73 y.o. female with a hx of hypertension, hyperlipidemia former smoker presented on November 13 for chest pain.  At that time due to her risk factors and her symptoms a nuclear stress test was ordered.  In the interim she was able to undergo her pharmacologic stress test which was reported as normal.  The patient also had a echocardiogram which reported a normal LV function, diastolic dysfunction with mildly dilated left atrium.  Today she is here for visit reporting chest pain.  The patient tells me the last several weeks she has been having runny nose as well as coughing.  She notes that she went to see her PCP this morning who has called in a prescription for antibiotics but also to see cardiology she mentioned that she has had left-sided intermittent chest pain.   Past Medical History:  Diagnosis Date  . Allergy to pollen 05/29/2013  . Arthritis    "knees" (05/09/2013)  . Asthma   . Blood transfusion ~ 1955   "before heart OR" (05/09/2013)  . Chronic cough 01/24/2013  . COMMON MIGRAINE 08/09/2007   Qualifier: Diagnosis of  By: Radene Ou MD, [place]    . 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 Constipation 05/29/2013  . COPD (chronic obstructive pulmonary disease) ([place])   . Exertional shortness of breath   . Foreign body sensation in throat 12/16/2016  . Gastroesophageal reflux disease with esophagitis 12/16/2016  . GERD (gastroesophageal reflux disease)   . Headache 03/20/2018   migraine Overview:  Overview:  migraine migraine  . Headache(784.0)    migraine   . Hemorrhoids     . History of stomach ulcers 1990's  . Hyperlipemia   . HYPERLIPIDEMIA 08/25/2007   Qualifier: Diagnosis of  By: Radene Ou MD, [place]    . Hypertension   . Hypertension   . HYPERTENSION 08/09/2007   Qualifier: Diagnosis of  By: Radene Ou MD, [place]    . Idiopathic peripheral autonomic neuropathy 03/27/2019  . Left lower quadrant pain 09/05/2018  . Left shoulder pain 02/05/2013  . Lipoma of abdominal wall 01/23/2012  . Migraines   . Nasal congestion   . Nicotine dependence 11/23/2018  . Pharyngeal dysphagia 12/16/2016  . Prediabetes 01/10/2013  . Rectal bleeding   . Scapular dyskinesis 03/26/2018  . SHINGLES, RECURRENT 09/30/2008   Qualifier: Diagnosis of  By: Radene Ou MD, [place]    . Spondylolisthesis, lumbar region 04/10/2019   Added automatically from request for surgery [phone]  . Sternal pain 03/26/2018  . Synovial cyst of lumbar facet joint 04/08/2019  . Urinary frequency   . Weight loss   . Wheezing     Past Surgical History:  Procedure Laterality Date  . APPENDECTOMY  1970's  . COLONOSCOPY    . COLONOSCOPY  12/22/2011   Procedure: COLONOSCOPY;  Surgeon: Lear Ng, MD;  Location: [facility];  Service: Endoscopy;  Laterality: N/A;  . heart surgery at 67yr  1955   "blood was going thru my lungs but not thru my heart like  it should" (05/09/2013)  . KNEE ARTHROSCOPY Left ~ 2004; ~ 2006  . LEFT OOPHORECTOMY Left ~ 1985  . LIPOMA EXCISION  02/15/2012   Procedure: EXCISION LIPOMA;  Surgeon: Joyice Faster. Cornett, MD;  Location: [facility];  Service: General;  Laterality: Left;  excision lipoma Left Lower abdomen  . TOTAL KNEE ARTHROPLASTY Left 05/08/2013  . TOTAL KNEE ARTHROPLASTY Left 05/08/2013   Procedure: TOTAL KNEE ARTHROPLASTY;  Surgeon: Ninetta Lights, MD;  Location: [facility];  Service: Orthopedics;  Laterality: Left;  . TUBAL LIGATION Left ~ 1985  . TUMOR EXCISION Left ~ 1984   "under breast" (05/09/2013)    Current Medications: Current Meds  Medication Sig  .  albuterol (PROVENTIL) (2.5 MG/3ML) 0.083% nebulizer solution Take 3 mLs (2.5 mg total) by nebulization every 6 (six) hours as needed for wheezing or shortness of breath.  Marland Kitchen aspirin EC 81 MG tablet Take 81 mg by mouth daily.  . budesonide-formoterol (SYMBICORT) 80-4.5 MCG/ACT inhaler Symbicort 80 mcg-4.5 mcg/actuation HFA aerosol inhaler  . folic acid (FOLVITE) 1 MG tablet Take 1 mg by mouth daily.  Marland Kitchen lisinopril-hydrochlorothiazide (ZESTORETIC) 20-12.5 MG tablet Take 1 tablet by mouth daily.  Marland Kitchen OMEPRAZOLE PO Take by mouth.  . ondansetron (ZOFRAN-ODT) 4 MG disintegrating tablet   . simvastatin (ZOCOR) 20 MG tablet Take 20 mg by mouth at bedtime.  . sucralfate (CARAFATE) 1 g tablet      Allergies:   Sulfa antibiotics, Amoxicillin, Azithromycin, Penicillins, Propoxyphene n-acetaminophen, and Clarithromycin   Social History   Socioeconomic History  . Marital status: Divorced    Spouse name: Not on file  . Number of children: 2  . Years of education: Not on file  . Highest education level: Not on file  Occupational History  . Occupation: cleaner    Comment: cleans apts and homes  Tobacco Use  . Smoking status: Former Smoker    Packs/day: 2.00    Years: 35.00    Pack years: 70.00    Types: Cigarettes    Quit date: 08/20/2013    Years since quitting: 6.2  . Smokeless tobacco: Never Used  . Tobacco comment: Currently exposed to 2nd hand smoke in home   Substance and Sexual Activity  . Alcohol use: No  . Drug use: No  . Sexual activity: Not on file  Other Topics Concern  . Not on file  Social History Narrative   Lives with her daughter's father.   Social Determinants of Health   Financial Resource Strain:   . Difficulty of Paying Living Expenses: Not on file  Food Insecurity:   . Worried About Charity fundraiser in the Last Year: Not on file  . Ran Out of Food in the Last Year: Not on file  Transportation Needs:   . Lack of Transportation (Medical): Not on file  . Lack of  Transportation (Non-Medical): Not on file  Physical Activity:   . Days of Exercise per Week: Not on file  . Minutes of Exercise per Session: Not on file  Stress:   . Feeling of Stress : Not on file  Social Connections:   . Frequency of Communication with Friends and Family: Not on file  . Frequency of Social Gatherings with Friends and Family: Not on file  . Attends Religious Services: Not on file  . Active Member of Clubs or Organizations: Not on file  . Attends Archivist Meetings: Not on file  . Marital Status: Not on file  Family History: The patient's family history includes Cancer in her father; Colon cancer in her father; Diabetes in her sister; Heart disease in her father and paternal uncle. There is no history of Malignant hyperthermia.  ROS:   Review of Systems  Constitution: Negative for decreased appetite, fever and weight gain.  HENT: Negative for congestion, ear discharge, hoarse voice and sore throat.   Eyes: Negative for discharge, redness, vision loss in right eye and visual halos.  Cardiovascular: Negative for chest pain, dyspnea on exertion, leg swelling, orthopnea and palpitations.  Respiratory: Negative for cough, hemoptysis, shortness of breath and snoring.   Endocrine: Negative for heat intolerance and polyphagia.  Hematologic/Lymphatic: Negative for bleeding problem. Does not bruise/bleed easily.  Skin: Negative for flushing, nail changes, rash and suspicious lesions.  Musculoskeletal: Negative for arthritis, joint pain, muscle cramps, myalgias, neck pain and stiffness.  Gastrointestinal: Negative for abdominal pain, bowel incontinence, diarrhea and excessive appetite.  Genitourinary: Negative for decreased libido, genital sores and incomplete emptying.  Neurological: Negative for brief paralysis, focal weakness, headaches and loss of balance.  Psychiatric/Behavioral: Negative for altered mental status, depression and suicidal ideas.    Allergic/Immunologic: Negative for HIV exposure and persistent infections.    EKGs/Labs/Other Studies Reviewed:    The following studies were reviewed today:   EKG:  The ekg ordered today demonstrates   Nuclear stress test   Nuclear stress EF: 86%.  There was no ST segment deviation noted during stress.  No T wave inversion was noted during stress.  The study is normal.  This is a low risk study.  The left ventricular ejection fraction is hyperdynamic (>65%).    Transthoracic echocardiogram IMPRESSIONS 01/12/2019  1. Left ventricular ejection fraction, by visual estimation, is 60 to 65%. The left ventricle has normal function. There is no left ventricular hypertrophy.  2. Elevated left atrial pressure.  3. Left ventricular diastolic parameters are consistent with Grade I diastolic dysfunction (impaired relaxation).  4. Global right ventricle has normal systolic function.The right ventricular size is normal. No increase in right ventricular wall thickness.  5. Left atrial size was mildly dilated.  6. Right atrial size was normal.  7. The mitral valve is normal in structure. No evidence of mitral valve regurgitation. No evidence of mitral stenosis.  8. The tricuspid valve is normal in structure. Tricuspid valve regurgitation is not demonstrated.  9. The aortic valve is normal in structure. Aortic valve regurgitation is not visualized. No evidence of aortic valve sclerosis or stenosis. 10. The pulmonic valve was normal in structure. Pulmonic valve regurgitation is not visualized. 11. The inferior vena cava is normal in size with greater than 50% respiratory variability, suggesting right atrial pressure of 3 mmHg.   Recent Labs: No results found for requested labs within last 8760 hours.  Recent Lipid Panel    Component Value Date/Time   CHOL 277 (H) 04/30/2008 2259   TRIG 154 (H) 04/30/2008 2259   HDL 55 04/30/2008 2259   CHOLHDL 5.0 Ratio 04/30/2008 2259   VLDL 31  04/30/2008 2259   LDLCALC 191 (H) 04/30/2008 2259    Physical Exam:    VS:  BP 124/70   Pulse 75   Ht 5\' 1"  (1.549 m)   Wt 173 lb (78.5 kg)   SpO2 97%   BMI 32.69 kg/m     Wt Readings from Last 3 Encounters:  12/03/19 173 lb (78.5 kg)  11/13/19 167 lb (75.8 kg)  11/01/19 167 lb (75.8 kg)  GEN: Well nourished, well developed in no acute distress HEENT: Normal NECK: No JVD; No carotid bruits LYMPHATICS: No lymphadenopathy CARDIAC: S1S2 noted,RRR, no murmurs, rubs, gallops RESPIRATORY: Diffuse wheezing  ABDOMEN: Soft, non-tender, non-distended, +bowel sounds, no guarding. EXTREMITIES: No edema, No cyanosis, no clubbing MUSCULOSKELETAL:  No edema; No deformity  SKIN: Warm and dry NEUROLOGIC:  Alert and oriented x 3, non-focal PSYCHIATRIC:  Normal affect, good insight  ASSESSMENT:    1. Chest pain of uncertain etiology   2. Mixed hyperlipidemia   3. Hypertension, unspecified type   4. Carotid artery disease, unspecified laterality, unspecified type ([place])    PLAN:     Her symptoms sounds like sinus/ upper airway infection.  She is having coughing as well as runny nose.  She was ordered antibiotic by her PCP this morning.   Her atypical intermittent chest pain her testing which includes a pharmacologic nuclear stress test as well as echocardiogram are all been within normal limits.  For now I will start her on Imdur 30 mg daily hopefully she will be able to get some relief.  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  The patient is in agreement with the above plan. The patient left the office in stable condition.  The patient will follow up in 1 month or sooner if needed.   Medication Adjustments/Labs and Tests Ordered: Current medicines are reviewed at length with the patient today.  Concerns regarding medicines are  outlined above.  Orders Placed This Encounter  Procedures  . VAS US CAROTID   Meds ordered this encounter  Medications  . isosorbide mononitrate (IMDUR) 30 MG 24 hr tablet    Sig: Take 1 tablet (30 mg total) by mouth daily.    Dispense:  90 tablet    Refill:  1    Patient Instructions  Medication Instructions:  Your physician has recommended you make the following change in your medication:   START: Imdur (isosoorbide) 30 mg Take 1 tab daily  *If you need a refill on your cardiac medications before your next appointment, please call your pharmacy*  Lab Work: NOne If you have labs (blood work) drawn today and your tests are completely normal, you will receive your results only by: Marland Kitchen MyChart Message (if you have MyChart) OR . A paper copy in the mail If you have any lab test that is abnormal or we need to change your treatment, we will call you to review the results.  Testing/Procedures: Your physician has requested that you have a carotid duplex. This test is an ultrasound of the carotid arteries in your neck. It looks at blood flow through these arteries that supply the brain with blood. Allow one hour for this exam. There are no restrictions or special instructions.   Follow-Up: At [facility], you and your health needs are our priority.  As part of our continuing mission to provide you with exceptional heart care, we have created designated Provider Care Teams.  These Care Teams include your primary Cardiologist (physician) and Advanced Practice Providers (APPs -  Physician Assistants and Nurse Practitioners) who all work together to provide you with the care you need, when you need it.  Your next appointment:   1 month(s)  The format for your next appointment:   In Person  Provider:   Berniece Salines, DO  Other Instructions  Isosorbide Mononitrate extended-release tablets What is this medicine? ISOSORBIDE MONONITRATE (eye soe SOR bide mon oh NYE trate) is a  vasodilator. It relaxes blood vessels, increasing the blood and oxygen supply to your heart. This medicine is used to prevent chest pain caused by angina. It will not help to stop an episode of chest pain. This medicine may be used for other purposes; ask your health care provider or pharmacist if you have questions. COMMON BRAND NAME(S): Imdur, Isotrate ER What should I tell my health care provider before I take this medicine? They need to know if you have any of these conditions:  previous heart attack or heart failure  an unusual or allergic reaction to isosorbide mononitrate, nitrates, other medicines, foods, dyes, or preservatives  pregnant or trying to get pregnant  breast-feeding How should I use this medicine? Take this medicine by mouth with a glass of water. Follow the directions on the prescription label. Do not crush or chew. Take your medicine at regular intervals. Do not take your medicine more often than directed. Do not stop taking this medicine except on the advice of your doctor or health care professional. Talk to your pediatrician regarding the use of this medicine in children. Special care may be needed. Overdosage: If you think you have taken too much of this medicine contact a poison control center or emergency room at once. NOTE: This medicine is only for you. Do not share this medicine with others. What if I miss a dose? If you miss a dose, take it as soon as you can. If it is almost time for your next dose, take only that dose. Do not take double or extra doses. What may interact with this medicine? Do not take this medicine with any of the following medications:  medicines used to treat erectile dysfunction (ED) like avanafil, sildenafil, tadalafil, and vardenafil  riociguat This medicine may also interact with the following medications:  medicines for high blood pressure  other medicines for angina or heart failure This list may not describe all possible  interactions. Give your health care provider a list of all the medicines, herbs, non-prescription drugs, or dietary supplements you use. Also tell them if you smoke, drink alcohol, or use illegal drugs. Some items may interact with your medicine. What should I watch for while using this medicine? Check your heart rate and blood pressure regularly while you are taking this medicine. Ask your doctor or health care professional what your heart rate and blood pressure should be and when you should contact him or her. Tell your doctor or health care professional if you feel your medicine is no longer working. You may get dizzy. Do not drive, use machinery, or do anything that needs mental alertness until you know how this medicine affects you. To reduce the risk of dizzy or fainting spells, do not sit or stand up quickly, especially if you are an older patient. Alcohol can make you more dizzy, and increase flushing and rapid heartbeats. Avoid alcoholic drinks. Do not treat yourself for coughs, colds, or pain while you are taking this medicine without asking your doctor or health care professional for advice. Some ingredients may increase your blood pressure. What side effects may I notice from receiving this medicine? Side effects that you should report to your doctor or health care professional as soon as possible:  bluish discoloration of lips, fingernails, or palms of hands  irregular heartbeat, palpitations  low blood pressure  nausea, vomiting  persistent headache  unusually weak or tired Side effects that usually do not require medical attention (report to your doctor or health care professional if they continue or are bothersome):  flushing of the face or neck  rash This list may not describe all possible side effects. Call your doctor for medical advice about side effects. You may report side effects to FDA at 1-800-FDA-1088. Where should I keep my medicine? Keep out of the reach of  children. Store between 15 and 30 degrees C (59 and 86 degrees F). Keep container tightly closed. Throw away any unused medicine after the expiration date. NOTE: This sheet is a summary. It may not cover all possible information. If you have questions about this medicine, talk to your doctor, pharmacist, or health care provider.  2020 Elsevier/Gold Standard (2013-10-04 14:48:19)      Adopting a Healthy Lifestyle.  Know what a healthy weight is for you (roughly BMI <25) and aim to maintain this   Aim for 7+ servings of fruits and vegetables daily   65-80+ fluid ounces of water or unsweet tea for healthy kidneys   Limit to max 1 drink of alcohol per day; avoid smoking/tobacco   Limit animal fats in diet for cholesterol and heart health - choose grass fed whenever available   Avoid highly processed foods, and foods high in saturated/trans fats   Aim for low stress - take time to unwind and care for your mental health   Aim for 150 min of moderate intensity exercise weekly for heart health, and weights twice weekly for bone health   Aim for 7-9 hours of sleep daily   When it comes to diets, agreement about the perfect plan isnt easy to find, even among the experts. Experts at the [employer] developed an idea known as the Healthy Eating Plate. Just imagine a plate divided into logical, healthy portions.   The emphasis is on diet quality:   Load up on vegetables and fruits - one-half of your plate: Aim for color and variety, and remember that potatoes dont count.   Go for whole grains - one-quarter of your plate: Whole wheat, barley, wheat berries, quinoa, oats, brown rice, and foods made with them. If you want pasta, go with whole wheat pasta.   Protein power - one-quarter of your plate: Fish, chicken, beans, and nuts are all healthy, versatile protein sources. Limit red meat.   The diet, however, does go beyond the plate, offering a few other suggestions.     Use healthy plant oils, such as olive, canola, soy, corn, sunflower and peanut. Check the labels, and avoid partially hydrogenated oil, which have unhealthy trans fats.   If youre thirsty, drink water. Coffee and tea are good in moderation, but skip sugary drinks and limit milk and dairy products to one or two daily servings.   The type of carbohydrate in the diet is more important than the amount. Some sources of carbohydrates, such as vegetables, fruits, whole grains, and beans-are healthier than others.   Finally, stay active  Signed, Berniece Salines, DO  12/03/2019 11:48 AM    [place]

## 2019-12-03 NOTE — Patient Instructions (Addendum)
Allergic rhinitis (runny nose)  - environmental allergy skin testing is positive to tree pollen, dust mite, mold and cat  - allergen avoidance measures discussed/handouts provided  - for control of runny nose use nasal Atrovent 2 sprays each nostril as needed up to 3-4 times a day  - recommend use of long-acting antihistamine like Zyrtec 10mg , Allegra 180mg  or Xyzal 5mg  daily as needed  - allergen immunotherapy discussed today including protocol, benefits and risk.  Informational handout provided.  If interested in this therapuetic option you can check with your insurance carrier for coverage.  Let us know if you would like to proceed with this option.    Asthma and COPD  - lung function looks ok today  - continue Symbicort 160mcg 2 puffs twice a day  - have access to albuterol inhaler 2 puffs or albuterol via nebulizer 1 vial every 4-6 hours as needed for cough/wheeze/shortness of breath/chest tightness.  May use 15-20 minutes prior to activity.   Monitor frequency of use.    Asthma control goals:   Full participation in all desired activities (may need albuterol before activity)  Albuterol use two time or less a week on average (not counting use with activity)  Cough interfering with sleep two time or less a month  Oral steroids no more than once a year  No hospitalizations  Take your other routine medications as directed Follow-up 3-4 months or sooner if needed

## 2019-12-03 NOTE — Progress Notes (Signed)
New Patient Note  RE: Summer Jackson MRN: [id number] DOB: Feb 29, 1946 Date of Office Visit: 12/03/2019  Referring provider: Bonnita Nasuti, MD Primary care provider: Bonnita Nasuti, MD  Chief Complaint: runny nose  History of present illness: Summer Jackson is a 73 y.o. female presenting today for consultation for nasal drainage.  She has a history of asthma with mild COPD.  She repots having constant runny nose and watery eyes.  She states symptoms have been ongoing for several months now.  She has more issues she states this time a year every year.  She is very tired of having to do with the symptoms.  She states she has not tried any nasal sprays or any antihistamines at this time.  She states she did see her PCP very recently who recommended she try fluticasone but she has not gotten this from the pharmacy yet.  She also states she was prescribed azithromycin which she has not started as she has not picked it up from the pharmacy either.  She does report blowing out green drainage.  She denies any fevers or chills.  She denies any worsening asthma symptoms however she states she does wheeze quite often.  She states she recently moved in November and does not know where her nebulizer machine with the albuterol vials arm to use that she has not been using anything.  She does take her Symbicort she states on a daily basis.  In regards to her asthma with COPD she denies any nighttime awakenings.  She denies any hospitalization or recent steroid needs. She denies any history of food allergy or eczema.    Review of systems: Review of Systems  Constitutional: Negative for chills, fever and malaise/fatigue.  HENT: Negative for congestion, ear discharge, ear pain, nosebleeds, sinus pain and sore throat.   Eyes: Negative for pain, discharge and redness.  Respiratory: Positive for wheezing.   Cardiovascular: Negative.   Gastrointestinal: Negative.   Musculoskeletal: Negative.   Skin:  Negative for itching and rash.  Neurological: Negative.     All other systems negative unless noted above in HPI  Past medical history: Past Medical History:  Diagnosis Date  . Allergy to pollen 05/29/2013  . Arthritis    "knees" (05/09/2013)  . Asthma   . Blood transfusion ~ 1955   "before heart OR" (05/09/2013)  . Chronic cough 01/24/2013  . COMMON MIGRAINE 08/09/2007   Qualifier: Diagnosis of  By: Radene Ou MD, [place]    . 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 Constipation 05/29/2013  . COPD (chronic obstructive pulmonary disease) ([place])   . Exertional shortness of breath   . Foreign body sensation in throat 12/16/2016  . Gastroesophageal reflux disease with esophagitis 12/16/2016  . GERD (gastroesophageal reflux disease)   . Headache 03/20/2018   migraine Overview:  Overview:  migraine migraine  . Headache(784.0)    migraine   . Hemorrhoids   . History of stomach ulcers 1990's  . Hyperlipemia   . HYPERLIPIDEMIA 08/25/2007   Qualifier: Diagnosis of  By: Radene Ou MD, [place]    . Hypertension   . Hypertension   . HYPERTENSION 08/09/2007   Qualifier: Diagnosis of  By: Radene Ou MD, [place]    . Idiopathic peripheral autonomic neuropathy 03/27/2019  . Left lower quadrant pain 09/05/2018  . Left shoulder pain 02/05/2013  . Lipoma of abdominal wall 01/23/2012  . Migraines   .  Nasal congestion   . Nicotine dependence 11/23/2018  . Pharyngeal dysphagia 12/16/2016  . Prediabetes 01/10/2013  . Rectal bleeding   . Scapular dyskinesis 03/26/2018  . SHINGLES, RECURRENT 09/30/2008   Qualifier: Diagnosis of  By: Radene Ou MD, [place]    . Spondylolisthesis, lumbar region 04/10/2019   Added automatically from request for surgery [id number]  . Sternal pain 03/26/2018  . Synovial cyst of lumbar facet joint 04/08/2019  . Urinary frequency   . Weight loss   . Wheezing     Past surgical history: Past Surgical History:  Procedure  Laterality Date  . APPENDECTOMY  1970's  . BACK SURGERY  04/2019  . COLONOSCOPY    . COLONOSCOPY  12/22/2011   Procedure: COLONOSCOPY;  Surgeon: Lear Ng, MD;  Location: [facility];  Service: Endoscopy;  Laterality: N/A;  . heart surgery at 33yr  1955   "blood was going thru my lungs but not thru my heart like it should" (05/09/2013)  . KNEE ARTHROSCOPY Left ~ 2004; ~ 2006  . LEFT OOPHORECTOMY Left ~ 1985  . LIPOMA EXCISION  02/15/2012   Procedure: EXCISION LIPOMA;  Surgeon: Joyice Faster. Cornett, MD;  Location: [facility];  Service: General;  Laterality: Left;  excision lipoma Left Lower abdomen  . TOTAL KNEE ARTHROPLASTY Left 05/08/2013  . TOTAL KNEE ARTHROPLASTY Left 05/08/2013   Procedure: TOTAL KNEE ARTHROPLASTY;  Surgeon: Ninetta Lights, MD;  Location: [facility];  Service: Orthopedics;  Laterality: Left;  . TUBAL LIGATION Left ~ 1985  . TUMOR EXCISION Left ~ 1984   "under breast" (05/09/2013)    Family history:  Family History  Problem Relation Age of Onset  . Colon cancer Father   . Cancer Father        colon  . Heart disease Father   . Heart disease Paternal Uncle   . Diabetes Sister   . Malignant hyperthermia Neg Hx     Social history: She lives in a home without carpeting with electric heating and window cooling.  There are dogs in the home.  There is concern for water damage or mildew.  No concern for roaches in the home.  She is retired.  Denies a smoking history  Medication List: Current Outpatient Medications  Medication Sig Dispense Refill  . albuterol (PROVENTIL) (2.5 MG/3ML) 0.083% nebulizer solution Take 3 mLs (2.5 mg total) by nebulization every 6 (six) hours as needed for wheezing or shortness of breath. 75 mL 12  . albuterol (VENTOLIN HFA) 108 (90 Base) MCG/ACT inhaler Can inhale two puffs every four to six hours as needed for cough or wheeze. 18 g 1  . aspirin EC 81 MG tablet Take 81 mg by mouth daily.    . budesonide-formoterol (SYMBICORT)  80-4.5 MCG/ACT inhaler Symbicort 80 mcg-4.5 mcg/actuation HFA aerosol inhaler    . cyclobenzaprine (FLEXERIL) 10 MG tablet Take 10 mg by mouth at bedtime as needed.    . folic acid (FOLVITE) 1 MG tablet Take 1 mg by mouth daily.    Marland Kitchen lisinopril-hydrochlorothiazide (ZESTORETIC) 20-12.5 MG tablet Take 1 tablet by mouth daily.    Marland Kitchen ofloxacin (FLOXIN) 0.3 % OTIC solution SMARTSIG:10 Drop(s) In Ear(s) Daily    . omeprazole (PRILOSEC) 40 MG capsule Take 40 mg by mouth daily.    . simvastatin (ZOCOR) 20 MG tablet Take 20 mg by mouth at bedtime.    . sucralfate (CARAFATE) 1 g tablet     . VITAMIN D, CHOLECALCIFEROL, PO Take by mouth.    Marland Kitchen  azithromycin (ZITHROMAX) 250 MG tablet Take by mouth as directed.    . fluticasone (FLONASE) 50 MCG/ACT nasal spray     . ipratropium (ATROVENT) 0.06 % nasal spray Can use two sprays in each nostril three to four times daily if needed for runny nose. 15 mL 5  . isosorbide mononitrate (IMDUR) 30 MG 24 hr tablet Take 1 tablet (30 mg total) by mouth daily. (Patient not taking: Reported on 12/03/2019) 90 tablet 1   No current facility-administered medications for this visit.    Known medication allergies: Allergies  Allergen Reactions  . Sulfa Antibiotics Swelling and Rash  . Amoxicillin Other (See Comments)    REACTION: headache  . Azithromycin Other (See Comments)    REACTION: reddness around mouth  . Clindamycin/Lincomycin Hives  . Erythromycin Hives  . Penicillins Other (See Comments)    REACTION: headache Has patient had a PCN reaction causing immediate rash, facial/tongue/throat swelling, SOB or lightheadedness with hypotension: No Has patient had a PCN reaction causing severe rash involving mucus membranes or skin necrosis: No Has patient had a PCN reaction that required hospitalization No Has patient had a PCN reaction occurring within the last 10 years: No If all of the above answers are "NO", then may proceed with Cephalosporin use.   Marland Kitchen Propoxyphene  N-Acetaminophen Other (See Comments)    REACTION: mouth sores  . Clarithromycin Rash    REACTION: skin rash with minimal vesicular formation on arms and hands     Physical examination: Blood pressure (!) 116/58, pulse 76, temperature 97.9 F (36.6 C), temperature source Temporal, resp. rate 18, height 5' 1.4" (1.56 m), weight 174 lb 12.8 oz (79.3 kg), SpO2 96 %.  General: Alert, interactive, in no acute distress. HEENT: PERRLA, TMs pearly gray, turbinates minimally edematous with clear discharge, post-pharynx non erythematous. Neck: Supple without lymphadenopathy. Lungs: Mildly decreased breath sounds bilaterally without wheezing, rhonchi or rales. {no increased work of breathing. CV: Normal S1, S2 without murmurs. Abdomen: Nondistended, nontender. Skin: Warm and dry, without lesions or rashes. Extremities:  No clubbing, cyanosis or edema. Neuro:   Grossly intact.  Diagnositics/Labs: Spirometry: FEV1: 1.37L 70%, FVC: 1.85L 71% predicted.  For age this falls in normal range  Allergy testing: environmental allergy skin prick testing is positive to [place], Rhizopus oryzae, both dust mites. Intradermal testing is positive to cat Allergy testing results were read and interpreted by provider, documented by clinical staff.   Assessment and plan:   Allergic rhinitis (runny nose)  - environmental allergy skin testing is positive to tree pollen, dust mite, mold and cat  - allergen avoidance measures discussed/handouts provided  - for control of runny nose use nasal Atrovent 2 sprays each nostril as needed up to 3-4 times a day  - recommend use of long-acting antihistamine like Zyrtec 10mg , Allegra 180mg  or Xyzal 5mg  daily as needed  - allergen immunotherapy discussed today including protocol, benefits and risk.  Informational handout provided.  If interested in this therapuetic option you can check with your insurance carrier for coverage.  Let us know if you would like to proceed with  this option.    Asthma with COPD  - lung function looks ok today  - continue Symbicort 134mcg 2 puffs twice a day  - have access to albuterol inhaler 2 puffs or albuterol via nebulizer 1 vial every 4-6 hours as needed for cough/wheeze/shortness of breath/chest tightness.  May use 15-20 minutes prior to activity.   Monitor frequency of use.    Asthma  control goals:   Full participation in all desired activities (may need albuterol before activity)  Albuterol use two time or less a week on average (not counting use with activity)  Cough interfering with sleep two time or less a month  Oral steroids no more than once a year  No hospitalizations  Take your other routine medications as directed Follow-up 3-4 months or sooner if needed   I appreciate the opportunity to take part in Jada's care. Please do not hesitate to contact me with questions.  Sincerely,   Prudy Feeler, MD Allergy/Immunology [facility]

## 2019-12-03 NOTE — Patient Instructions (Addendum)
Medication Instructions:  Your physician has recommended you make the following change in your medication:   START: Imdur (isosoorbide) 30 mg Take 1 tab daily  *If you need a refill on your cardiac medications before your next appointment, please call your pharmacy*  Lab Work: NOne If you have labs (blood work) drawn today and your tests are completely normal, you will receive your results only by: Marland Kitchen MyChart Message (if you have MyChart) OR . A paper copy in the mail If you have any lab test that is abnormal or we need to change your treatment, we will call you to review the results.  Testing/Procedures: Your physician has requested that you have a carotid duplex. This test is an ultrasound of the carotid arteries in your neck. It looks at blood flow through these arteries that supply the brain with blood. Allow one hour for this exam. There are no restrictions or special instructions.   Follow-Up: At [facility], you and your health needs are our priority.  As part of our continuing mission to provide you with exceptional heart care, we have created designated Provider Care Teams.  These Care Teams include your primary Cardiologist (physician) and Advanced Practice Providers (APPs -  Physician Assistants and Nurse Practitioners) who all work together to provide you with the care you need, when you need it.  Your next appointment:   1 month(s)  The format for your next appointment:   In Person  Provider:   Berniece Salines, DO  Other Instructions Isosorbide Mononitrate extended-release tablets What is this medicine? ISOSORBIDE MONONITRATE (eye soe SOR bide mon oh NYE trate) is a vasodilator. It relaxes blood vessels, increasing the blood and oxygen supply to your heart. This medicine is used to prevent chest pain caused by angina. It will not help to stop an episode of chest pain. This medicine may be used for other purposes; ask your health care provider or pharmacist if you have  questions. COMMON BRAND NAME(S): Imdur, Isotrate ER What should I tell my health care provider before I take this medicine? They need to know if you have any of these conditions:  previous heart attack or heart failure  an unusual or allergic reaction to isosorbide mononitrate, nitrates, other medicines, foods, dyes, or preservatives  pregnant or trying to get pregnant  breast-feeding How should I use this medicine? Take this medicine by mouth with a glass of water. Follow the directions on the prescription label. Do not crush or chew. Take your medicine at regular intervals. Do not take your medicine more often than directed. Do not stop taking this medicine except on the advice of your doctor or health care professional. Talk to your pediatrician regarding the use of this medicine in children. Special care may be needed. Overdosage: If you think you have taken too much of this medicine contact a poison control center or emergency room at once. NOTE: This medicine is only for you. Do not share this medicine with others. What if I miss a dose? If you miss a dose, take it as soon as you can. If it is almost time for your next dose, take only that dose. Do not take double or extra doses. What may interact with this medicine? Do not take this medicine with any of the following medications:  medicines used to treat erectile dysfunction (ED) like avanafil, sildenafil, tadalafil, and vardenafil  riociguat This medicine may also interact with the following medications:  medicines for high blood pressure  other medicines  for angina or heart failure This list may not describe all possible interactions. Give your health care provider a list of all the medicines, herbs, non-prescription drugs, or dietary supplements you use. Also tell them if you smoke, drink alcohol, or use illegal drugs. Some items may interact with your medicine. What should I watch for while using this medicine? Check your  heart rate and blood pressure regularly while you are taking this medicine. Ask your doctor or health care professional what your heart rate and blood pressure should be and when you should contact him or her. Tell your doctor or health care professional if you feel your medicine is no longer working. You may get dizzy. Do not drive, use machinery, or do anything that needs mental alertness until you know how this medicine affects you. To reduce the risk of dizzy or fainting spells, do not sit or stand up quickly, especially if you are an older patient. Alcohol can make you more dizzy, and increase flushing and rapid heartbeats. Avoid alcoholic drinks. Do not treat yourself for coughs, colds, or pain while you are taking this medicine without asking your doctor or health care professional for advice. Some ingredients may increase your blood pressure. What side effects may I notice from receiving this medicine? Side effects that you should report to your doctor or health care professional as soon as possible:  bluish discoloration of lips, fingernails, or palms of hands  irregular heartbeat, palpitations  low blood pressure  nausea, vomiting  persistent headache  unusually weak or tired Side effects that usually do not require medical attention (report to your doctor or health care professional if they continue or are bothersome):  flushing of the face or neck  rash This list may not describe all possible side effects. Call your doctor for medical advice about side effects. You may report side effects to FDA at 1-800-FDA-1088. Where should I keep my medicine? Keep out of the reach of children. Store between 15 and 30 degrees C (59 and 86 degrees F). Keep container tightly closed. Throw away any unused medicine after the expiration date. NOTE: This sheet is a summary. It may not cover all possible information. If you have questions about this medicine, talk to your doctor, pharmacist, or  health care provider.  2020 Elsevier/Gold Standard (2013-10-04 14:48:19)

## 2019-12-04 NOTE — Addendum Note (Signed)
Addended by: Particia Nearing B on: 12/04/2019 08:10 AM   Modules accepted: Orders

## 2019-12-09 ENCOUNTER — Other Ambulatory Visit: Payer: Self-pay

## 2019-12-09 ENCOUNTER — Ambulatory Visit (HOSPITAL_BASED_OUTPATIENT_CLINIC_OR_DEPARTMENT_OTHER)
Admission: RE | Admit: 2019-12-09 | Discharge: 2019-12-09 | Disposition: A | Discharge status: Home / Self Care | Payer: Medicare Other | Source: Ambulatory Visit | Attending: Cardiology | Admitting: Cardiology

## 2019-12-09 DIAGNOSIS — I779 Disorder of arteries and arterioles, unspecified: Secondary | ICD-10-CM | POA: Insufficient documentation

## 2019-12-09 NOTE — Progress Notes (Signed)
VAS US CAROTID DUPLEX BILATERAL    12/10/19 Cardell Peach RDCS, RVT

## 2019-12-10 ENCOUNTER — Telehealth: Payer: Self-pay | Admitting: *Deleted

## 2019-12-10 NOTE — Telephone Encounter (Signed)
Telephone call to patient. Left message that carotid U/S was normal and to call the office with any questions.

## 2019-12-10 NOTE — Telephone Encounter (Signed)
-----   Message from Berniece Salines, DO sent at 12/10/2019  8:19 AM EST ----- Normal caroitid ultrasound.

## 2020-01-03 ENCOUNTER — Encounter: Payer: Self-pay | Admitting: Cardiology

## 2020-01-03 ENCOUNTER — Ambulatory Visit (INDEPENDENT_AMBULATORY_CARE_PROVIDER_SITE_OTHER): Payer: Medicare Other | Admitting: Cardiology

## 2020-01-03 ENCOUNTER — Other Ambulatory Visit: Payer: Self-pay

## 2020-01-03 VITALS — BP 124/80 | HR 74 | Ht 61.4 in | Wt 170.0 lb

## 2020-01-03 DIAGNOSIS — R600 Localized edema: Secondary | ICD-10-CM

## 2020-01-03 DIAGNOSIS — R079 Chest pain, unspecified: Secondary | ICD-10-CM

## 2020-01-03 DIAGNOSIS — E782 Mixed hyperlipidemia: Secondary | ICD-10-CM

## 2020-01-03 DIAGNOSIS — I5189 Other ill-defined heart diseases: Secondary | ICD-10-CM

## 2020-01-03 DIAGNOSIS — I10 Essential (primary) hypertension: Secondary | ICD-10-CM

## 2020-01-03 LAB — BASIC METABOLIC PANEL
BUN/Creatinine Ratio: 31 — ABNORMAL HIGH (ref 12–28)
BUN: 17 mg/dL (ref 8–27)
CO2: 23 mmol/L (ref 20–29)
Calcium: 9.7 mg/dL (ref 8.7–10.3)
Chloride: 99 mmol/L (ref 96–106)
Creatinine, Ser: 0.54 mg/dL — ABNORMAL LOW (ref 0.57–1.00)
GFR calc Af Amer: 110 mL/min/{1.73_m2} (ref >59)
GFR calc non Af Amer: 95 mL/min/{1.73_m2} (ref >59)
Glucose: 94 mg/dL (ref 65–99)
Potassium: 4.3 mmol/L (ref 3.5–5.2)
Sodium: 134 mmol/L (ref 134–144)

## 2020-01-03 LAB — MAGNESIUM: Magnesium: 2 mg/dL (ref 1.6–2.3)

## 2020-01-03 MED ORDER — POTASSIUM CHLORIDE CRYS ER 20 MEQ PO TBCR: EXTENDED_RELEASE_TABLET | ORAL | 1 refills | Status: DC

## 2020-01-03 MED ORDER — FUROSEMIDE 20 MG PO TABS: ORAL_TABLET | ORAL | 1 refills | Status: DC

## 2020-01-03 NOTE — Patient Instructions (Signed)
Medication Instructions:  Your physician has recommended you make the following change in your medication:   START: Furosemide 20 mg Take 1 tab on Mon and Fridays START: Potassium 20 meq Take 1 tab on Mon and Fridays  *If you need a refill on your cardiac medications before your next appointment, please call your pharmacy*  Lab Work: Your physician recommends that you return for lab work in: TODAY BMP,Magnesium  In 3 months: BMP,Magnesium  If you have labs (blood work) drawn today and your tests are completely normal, you will receive your results only by: Marland Kitchen MyChart Message (if you have MyChart) OR . A paper copy in the mail If you have any lab test that is abnormal or we need to change your treatment, we will call you to review the results.  Testing/Procedures: None  Follow-Up: At [facility], you and your health needs are our priority.  As part of our continuing mission to provide you with exceptional heart care, we have created designated Provider Care Teams.  These Care Teams include your primary Cardiologist (physician) and Advanced Practice Providers (APPs -  Physician Assistants and Nurse Practitioners) who all work together to provide you with the care you need, when you need it.  Your next appointment:   3 month(s)  The format for your next appointment:   In Person  Provider:   Berniece Salines, DO  Other Instructions

## 2020-01-03 NOTE — Progress Notes (Signed)
Cardiology Office Note:    Date:  01/03/2020   ID:  Summer Jackson, DOB May 25, 1946, MRN [id number]  PCP:  Summer Nasuti, MD  Cardiologist:  Summer Salines, DO  Electrophysiologist:  None   Referring MD: Summer Salines, DO   Chief Complaint  Patient presents with  . Follow-up    History of Present Illness:     Summer Jackson is a 74 y.o. female with a hx of hypertension, hyperlipidemia former smoker presented on November 13,2020 for chest pain.  At that time due to her risk factors and her symptoms a nuclear stress test was ordered.   I did see her in follow-up on December 03, 2019 at that time we discussed her nuclear stress test which was normal as well as her echocardiogram showed evidence of normal LV function, diastolic dysfunction with mildly dilated left atrium.  Visit she complained of chest pain therefore having her normal nuclear stress test I started the patient on Imdur 30 mg daily.  Was also concerned that she was told in the past that she had carotid artery aneurysm but cannot remember when this was therefore she requested to get a bilateral carotid artery duplex.  She is here for follow-up visit she tells me the chest pain has resolved on the Imdur 30 mg daily she admits to some headaches but she tells me this is tolerable.  What is new for her new is bilateral leg edema and some shortness of breath on significant exertion.  Past Medical History:  Diagnosis Date  . Allergy to pollen 05/29/2013  . Arthritis    "knees" (05/09/2013)  . Asthma   . Blood transfusion ~ 1955   "before heart OR" (05/09/2013)  . Chronic cough 01/24/2013  . COMMON MIGRAINE 08/09/2007   Qualifier: Diagnosis of  By: Radene Ou MD, [place]    . 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 Constipation 05/29/2013  . COPD (chronic obstructive pulmonary disease) ([place])   . Exertional shortness of breath   . Foreign body sensation in  throat 12/16/2016  . Gastroesophageal reflux disease with esophagitis 12/16/2016  . GERD (gastroesophageal reflux disease)   . Headache 03/20/2018   migraine Overview:  Overview:  migraine migraine  . Headache(784.0)    migraine   . Hemorrhoids   . History of stomach ulcers 1990's  . Hyperlipemia   . HYPERLIPIDEMIA 08/25/2007   Qualifier: Diagnosis of  By: Radene Ou MD, [place]    . Hypertension   . Hypertension   . HYPERTENSION 08/09/2007   Qualifier: Diagnosis of  By: Radene Ou MD, [place]    . Idiopathic peripheral autonomic neuropathy 03/27/2019  . Left lower quadrant pain 09/05/2018  . Left shoulder pain 02/05/2013  . Lipoma of abdominal wall 01/23/2012  . Migraines   . Nasal congestion   . Nicotine dependence 11/23/2018  . Pharyngeal dysphagia 12/16/2016  . Prediabetes 01/10/2013  . Rectal bleeding   . Scapular dyskinesis 03/26/2018  . SHINGLES, RECURRENT 09/30/2008   Qualifier: Diagnosis of  By: Radene Ou MD, [place]    . Spondylolisthesis, lumbar region 04/10/2019   Added automatically from request for surgery [id number]  . Sternal pain 03/26/2018  . Synovial cyst of lumbar facet joint 04/08/2019  . Urinary frequency   . Weight loss   . Wheezing     Past Surgical History:  Procedure Laterality Date  . APPENDECTOMY  1970's  . BACK SURGERY  04/2019  .  COLONOSCOPY    . COLONOSCOPY  12/22/2011   Procedure: COLONOSCOPY;  Surgeon: Summer Ng, MD;  Location: [facility];  Service: Endoscopy;  Laterality: N/A;  . heart surgery at 47yr  1955   "blood was going thru my lungs but not thru my heart like it should" (05/09/2013)  . KNEE ARTHROSCOPY Left ~ 2004; ~ 2006  . LEFT OOPHORECTOMY Left ~ 1985  . LIPOMA EXCISION  02/15/2012   Procedure: EXCISION LIPOMA;  Surgeon: Summer Faster. Cornett, MD;  Location: [facility];  Service: General;  Laterality: Left;  excision lipoma Left Lower abdomen  . TOTAL KNEE ARTHROPLASTY Left 05/08/2013  . TOTAL KNEE ARTHROPLASTY Left 05/08/2013    Procedure: TOTAL KNEE ARTHROPLASTY;  Surgeon: Summer Lights, MD;  Location: [facility];  Service: Orthopedics;  Laterality: Left;  . TUBAL LIGATION Left ~ 1985  . TUMOR EXCISION Left ~ 1984   "under breast" (05/09/2013)    Current Medications: Current Meds  Medication Sig  . albuterol (PROVENTIL) (2.5 MG/3ML) 0.083% nebulizer solution Take 3 mLs (2.5 mg total) by nebulization every 6 (six) hours as needed for wheezing or shortness of breath.  Marland Kitchen albuterol (VENTOLIN HFA) 108 (90 Base) MCG/ACT inhaler Can inhale two puffs every four to six hours as needed for cough or wheeze.  Marland Kitchen aspirin EC 81 MG tablet Take 81 mg by mouth daily.  . budesonide-formoterol (SYMBICORT) 80-4.5 MCG/ACT inhaler Symbicort 80 mcg-4.5 mcg/actuation HFA aerosol inhaler  . cyclobenzaprine (FLEXERIL) 10 MG tablet Take 10 mg by mouth at bedtime as needed.  . fluticasone (FLONASE) 50 MCG/ACT nasal spray   . folic acid (FOLVITE) 1 MG tablet Take 1 mg by mouth daily.  Marland Kitchen ipratropium (ATROVENT) 0.06 % nasal spray Can use two sprays in each nostril three to four times daily if needed for runny nose.  Marland Kitchen lisinopril-hydrochlorothiazide (ZESTORETIC) 20-12.5 MG tablet Take 1 tablet by mouth daily.  Marland Kitchen omeprazole (PRILOSEC) 40 MG capsule Take 40 mg by mouth daily.  . simvastatin (ZOCOR) 20 MG tablet Take 20 mg by mouth at bedtime.  . sucralfate (CARAFATE) 1 g tablet   . triamcinolone ointment (KENALOG) 0.1 % APPLY OINTMENT TOPICALLY TO AFFECTED AREA TWICE DAILYUSE ONLY AS NEEDED DO NOT USE REGULARLY OR DAILY  . VITAMIN D, CHOLECALCIFEROL, PO Take by mouth.     Allergies:   Sulfa antibiotics, Amoxicillin, Azithromycin, Clindamycin/lincomycin, Erythromycin, Penicillins, Propoxyphene n-acetaminophen, and Clarithromycin   Social History   Socioeconomic History  . Marital status: Divorced    Spouse name: Not on file  . Number of children: 2  . Years of education: Not on file  . Highest education level: Not on file  Occupational History    . Occupation: cleaner    Comment: cleans apts and homes  Tobacco Use  . Smoking status: Former Smoker    Packs/day: 2.00    Years: 35.00    Pack years: 70.00    Types: Cigarettes    Quit date: 08/20/2013    Years since quitting: 6.3  . Smokeless tobacco: Never Used  . Tobacco comment: Currently exposed to 2nd hand smoke in home   Substance and Sexual Activity  . Alcohol use: No  . Drug use: No  . Sexual activity: Not on file  Other Topics Concern  . Not on file  Social History Narrative   Lives with her daughter's father.   Social Determinants of Health   Financial Resource Strain:   . Difficulty of Paying Living Expenses: Not on file  Food Insecurity:   . Worried About Charity fundraiser in the Last Year: Not on file  . Ran Out of Food in the Last Year: Not on file  Transportation Needs:   . Lack of Transportation (Medical): Not on file  . Lack of Transportation (Non-Medical): Not on file  Physical Activity:   . Days of Exercise per Week: Not on file  . Minutes of Exercise per Session: Not on file  Stress:   . Feeling of Stress : Not on file  Social Connections:   . Frequency of Communication with Friends and Family: Not on file  . Frequency of Social Gatherings with Friends and Family: Not on file  . Attends Religious Services: Not on file  . Active Member of Clubs or Organizations: Not on file  . Attends Archivist Meetings: Not on file  . Marital Status: Not on file     Family History: The patient's family history includes Cancer in her father; Colon cancer in her father; Diabetes in her sister; Heart disease in her father and paternal uncle. There is no history of Malignant hyperthermia.  ROS:   Review of Systems  Constitution: Negative for decreased appetite, fever and weight gain.  HENT: Negative for congestion, ear discharge, hoarse voice and sore throat.   Eyes: Negative for discharge, redness, vision loss in right eye and visual halos.   Cardiovascular: Negative for chest pain, dyspnea on exertion, leg swelling, orthopnea and palpitations.  Respiratory: Negative for cough, hemoptysis, shortness of breath and snoring.   Endocrine: Negative for heat intolerance and polyphagia.  Hematologic/Lymphatic: Negative for bleeding problem. Does not bruise/bleed easily.  Skin: Negative for flushing, nail changes, rash and suspicious lesions.  Musculoskeletal: Negative for arthritis, joint pain, muscle cramps, myalgias, neck pain and stiffness.  Gastrointestinal: Negative for abdominal pain, bowel incontinence, diarrhea and excessive appetite.  Genitourinary: Negative for decreased libido, genital sores and incomplete emptying.  Neurological: Negative for brief paralysis, focal weakness, headaches and loss of balance.  Psychiatric/Behavioral: Negative for altered mental status, depression and suicidal ideas.  Allergic/Immunologic: Negative for HIV exposure and persistent infections.    EKGs/Labs/Other Studies Reviewed:    The following studies were reviewed today:   EKG:  none today  Nuclear stress test   Nuclear stress EF: 86%.  There was no ST segment deviation noted during stress.  No T wave inversion was noted during stress.  The study is normal.  This is a low risk study.  The left ventricular ejection fraction is hyperdynamic (>65%).   Transthoracic echocardiogram IMPRESSIONS 01/12/2019 1. Left ventricular ejection fraction, by visual estimation, is 60 to 65%. The left ventricle has normal function. There is no left ventricular hypertrophy. 2. Elevated left atrial pressure. 3. Left ventricular diastolic parameters are consistent with Grade I diastolic dysfunction (impaired relaxation). 4. Global right ventricle has normal systolic function.The right ventricular size is normal. No increase in right ventricular wall thickness. 5. Left atrial size was mildly dilated. 6. Right atrial size was normal. 7. The  mitral valve is normal in structure. No evidence of mitral valve regurgitation. No evidence of mitral stenosis. 8. The tricuspid valve is normal in structure. Tricuspid valve regurgitation is not demonstrated. 9. The aortic valve is normal in structure. Aortic valve regurgitation is not visualized. No evidence of aortic valve sclerosis or stenosis. 10. The pulmonic valve was normal in structure. Pulmonic valve regurgitation is not visualized. 11. The inferior vena cava is normal in size with greater than 50% respiratory  variability, suggesting right atrial pressure of 3 mmHg.  Recent Labs: No results found for requested labs within last 8760 hours.  Recent Lipid Panel    Component Value Date/Time   CHOL 277 (H) 04/30/2008 2259   TRIG 154 (H) 04/30/2008 2259   HDL 55 04/30/2008 2259   CHOLHDL 5.0 Ratio 04/30/2008 2259   VLDL 31 04/30/2008 2259   LDLCALC 191 (H) 04/30/2008 2259    Physical Exam:    VS:  BP 124/80 (BP Location: Right Arm, Patient Position: Sitting, Cuff Size: Normal)   Pulse 74   Ht 5' 1.4" (1.56 m)   Wt 170 lb (77.1 kg)   SpO2 97%   BMI 31.70 kg/m     Wt Readings from Last 3 Encounters:  01/03/20 170 lb (77.1 kg)  12/03/19 174 lb 12.8 oz (79.3 kg)  12/03/19 173 lb (78.5 kg)     GEN: Well nourished, well developed in no acute distress HEENT: Normal NECK: No JVD; No carotid bruits LYMPHATICS: No lymphadenopathy CARDIAC: S1S2 noted,RRR, no murmurs, rubs, gallops RESPIRATORY:  Clear to auscultation without rales, wheezing or rhonchi  ABDOMEN: Soft, non-tender, non-distended, +bowel sounds, no guarding. EXTREMITIES: No edema, No cyanosis, no clubbing MUSCULOSKELETAL:  No edema; No deformity  SKIN: Warm and dry NEUROLOGIC:  Alert and oriented x 3, non-focal PSYCHIATRIC:  Normal affect, good insight  ASSESSMENT:    1. Leg edema   2. Hypertension, unspecified type   3. Chest pain of uncertain etiology   4. Mixed hyperlipidemia   5. Diastolic dysfunction     PLAN:    In order of problems listed above:  1.  For bilateral leg edema and shortness of breath especially in the setting of her diastolic dysfunction I am going to start the patient on Lasix 20 mg twice weekly along with potassium supplement.  Hoping this will help improve the symptom.    2.  She is pleased with her chest pain improvement on the Imdur therefore we will continue this medication.  3.  Her blood pressure is appropriate-is continue her current regimen.  4.  Lipidemia continue simvastatin.  The patient is in agreement with the above plan. The patient left the office in stable condition.  The patient will follow up in   Medication Adjustments/Labs and Tests Ordered: Current medicines are reviewed at length with the patient today.  Concerns regarding medicines are outlined above.  Orders Placed This Encounter  Procedures  . Basic Metabolic Panel (BMET)  . Magnesium  . Basic Metabolic Panel (BMET)  . Magnesium   Meds ordered this encounter  Medications  . furosemide (LASIX) 20 MG tablet    Sig: Take 1 tab Mon and Fri    Dispense:  30 tablet    Refill:  1  . potassium chloride SA (KLOR-CON) 20 MEQ tablet    Sig: Take 1 tab Mon And Fridays    Dispense:  30 tablet    Refill:  1    Patient Instructions  Medication Instructions:  Your physician has recommended you make the following change in your medication:   START: Furosemide 20 mg Take 1 tab on Mon and Fridays START: Potassium 20 meq Take 1 tab on Mon and Fridays  *If you need a refill on your cardiac medications before your next appointment, please call your pharmacy*  Lab Work: Your physician recommends that you return for lab work in: TODAY BMP,Magnesium  In 3 months: BMP,Magnesium  If you have labs (blood work) drawn today and your  tests are completely normal, you will receive your results only by: Marland Kitchen MyChart Message (if you have MyChart) OR . A paper copy in the mail If you have any lab test  that is abnormal or we need to change your treatment, we will call you to review the results.  Testing/Procedures: None  Follow-Up: At [facility], you and your health needs are our priority.  As part of our continuing mission to provide you with exceptional heart care, we have created designated Provider Care Teams.  These Care Teams include your primary Cardiologist (physician) and Advanced Practice Providers (APPs -  Physician Assistants and Nurse Practitioners) who all work together to provide you with the care you need, when you need it.  Your next appointment:   3 month(s)  The format for your next appointment:   In Person  Provider:   Berniece Salines, DO  Other Instructions      Adopting a Healthy Lifestyle.  Know what a healthy weight is for you (roughly BMI <25) and aim to maintain this   Aim for 7+ servings of fruits and vegetables daily   65-80+ fluid ounces of water or unsweet tea for healthy kidneys   Limit to max 1 drink of alcohol per day; avoid smoking/tobacco   Limit animal fats in diet for cholesterol and heart health - choose grass fed whenever available   Avoid highly processed foods, and foods high in saturated/trans fats   Aim for low stress - take time to unwind and care for your mental health   Aim for 150 min of moderate intensity exercise weekly for heart health, and weights twice weekly for bone health   Aim for 7-9 hours of sleep daily   When it comes to diets, agreement about the perfect plan isnt easy to find, even among the experts. Experts at the [employer] developed an idea known as the Healthy Eating Plate. Just imagine a plate divided into logical, healthy portions.   The emphasis is on diet quality:   Load up on vegetables and fruits - one-half of your plate: Aim for color and variety, and remember that potatoes dont count.   Go for whole grains - one-quarter of your plate: Whole wheat, barley, wheat berries,  quinoa, oats, brown rice, and foods made with them. If you want pasta, go with whole wheat pasta.   Protein power - one-quarter of your plate: Fish, chicken, beans, and nuts are all healthy, versatile protein sources. Limit red meat.   The diet, however, does go beyond the plate, offering a few other suggestions.   Use healthy plant oils, such as olive, canola, soy, corn, sunflower and peanut. Check the labels, and avoid partially hydrogenated oil, which have unhealthy trans fats.   If youre thirsty, drink water. Coffee and tea are good in moderation, but skip sugary drinks and limit milk and dairy products to one or two daily servings.   The type of carbohydrate in the diet is more important than the amount. Some sources of carbohydrates, such as vegetables, fruits, whole grains, and beans-are healthier than others.   Finally, stay active  Signed, Summer Salines, DO  01/03/2020 11:33 AM    [place]

## 2020-01-06 ENCOUNTER — Telehealth: Payer: Self-pay | Admitting: *Deleted

## 2020-01-06 NOTE — Telephone Encounter (Signed)
Telephone call to patient. Left message with lab results and to call with any questions.

## 2020-01-06 NOTE — Telephone Encounter (Signed)
-----   Message from Berniece Salines, DO sent at 01/05/2020 10:12 PM EST ----- Normal labs

## 2020-01-20 ENCOUNTER — Ambulatory Visit: Payer: Medicare Other | Admitting: Cardiology

## 2020-02-04 ENCOUNTER — Ambulatory Visit: Payer: Medicare Other | Admitting: Cardiology

## 2020-02-14 ENCOUNTER — Telehealth: Payer: Self-pay | Admitting: Cardiology

## 2020-02-14 ENCOUNTER — Encounter: Payer: Self-pay | Admitting: Cardiology

## 2020-02-14 ENCOUNTER — Ambulatory Visit (INDEPENDENT_AMBULATORY_CARE_PROVIDER_SITE_OTHER): Payer: Medicare Other | Admitting: Cardiology

## 2020-02-14 ENCOUNTER — Other Ambulatory Visit: Payer: Self-pay

## 2020-02-14 VITALS — BP 140/90 | HR 68 | Ht 61.4 in | Wt 169.0 lb

## 2020-02-14 DIAGNOSIS — Z131 Encounter for screening for diabetes mellitus: Secondary | ICD-10-CM | POA: Diagnosis not present

## 2020-02-14 DIAGNOSIS — I10 Essential (primary) hypertension: Secondary | ICD-10-CM

## 2020-02-14 DIAGNOSIS — R600 Localized edema: Secondary | ICD-10-CM | POA: Diagnosis not present

## 2020-02-14 MED ORDER — FUROSEMIDE 20 MG PO TABS: 20.0000 mg | ORAL_TABLET | Freq: Every day | ORAL | 1 refills | Status: DC

## 2020-02-14 MED ORDER — POTASSIUM CHLORIDE CRYS ER 20 MEQ PO TBCR: 20.0000 meq | EXTENDED_RELEASE_TABLET | Freq: Every day | ORAL | 1 refills | Status: DC

## 2020-02-14 NOTE — Patient Instructions (Addendum)
Medication Instructions:  Your physician has recommended you make the following change in your medication:   INCREASE: Furosemide 20 mg Take 1 tab daily INCREASE: Potassium 20 meq Take 1 tab daily  *If you need a refill on your cardiac medications before your next appointment, please call your pharmacy*   Lab Work: Your physician recommends that you return for lab work in: [place]  If you have labs (blood work) drawn today and your tests are completely normal, you will receive your results only by: Marland Kitchen MyChart Message (if you have MyChart) OR . A paper copy in the mail If you have any lab test that is abnormal or we need to change your treatment, we will call you to review the results.   Testing/Procedures: None   Follow-Up: At [facility], you and your health needs are our priority.  As part of our continuing mission to provide you with exceptional heart care, we have created designated Provider Care Teams.  These Care Teams include your primary Cardiologist (physician) and Advanced Practice Providers (APPs -  Physician Assistants and Nurse Practitioners) who all work together to provide you with the care you need, when you need it.  We recommend signing up for the patient portal called "MyChart".  Sign up information is provided on this After Visit Summary.  MyChart is used to connect with patients for Virtual Visits (Telemedicine).  Patients are able to view lab/test results, encounter notes, upcoming appointments, etc.  Non-urgent messages can be sent to your provider as well.   To learn more about what you can do with MyChart, go to [email or web address].    Your next appointment:   1 month(s)  The format for your next appointment:   In Person  Provider:   Berniece Salines, DO   Other Instructions Please see your [facility] MD for you neuropathy pain. Continue to wear your support stockings.

## 2020-02-14 NOTE — Progress Notes (Signed)
Cardiology Office Note:    Date:  02/14/2020   ID:  Summer Jackson, DOB [birth year], MRN [id number]  PCP:  Bonnita Nasuti, MD  Cardiologist:  Berniece Salines, DO  Electrophysiologist:  None   Referring MD: Bonnita Nasuti, MD   Chief Complaint  Patient presents with  . Edema   History of Present Illness:    Summer Jackson is a 74 y.o. female with a hx of pretension, hyperlipidemia, former smoker comes today for an abrupt visit as a walk-in due to bilateral leg swelling.  Patient tells me that she had missed her Lasix for several days because she was placed on prednisone.  She reports that over the last 2 days she is seen as she has had bilateral leg edema which was worsening as well as she has been having upper hip numbness and painful sensation.  She denies any chest pain, shortness of breath, nausea, vomiting.  No other complaints at this time.  Past Medical History:  Diagnosis Date  . Allergy to pollen 05/29/2013  . Arthritis    "knees" (05/09/2013)  . Asthma   . Blood transfusion ~ 1955   "before heart OR" (05/09/2013)  . Chronic cough 01/24/2013  . COMMON MIGRAINE 08/09/2007   Qualifier: Diagnosis of  By: Radene Ou MD, [place]    . 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 Constipation 05/29/2013  . COPD (chronic obstructive pulmonary disease) ([place])   . Exertional shortness of breath   . Foreign body sensation in throat 12/16/2016  . Gastroesophageal reflux disease with esophagitis 12/16/2016  . GERD (gastroesophageal reflux disease)   . Headache 03/20/2018   migraine Overview:  Overview:  migraine migraine  . Headache(784.0)    migraine   . Hemorrhoids   . History of stomach ulcers 1990's  . Hyperlipemia   . HYPERLIPIDEMIA 08/25/2007   Qualifier: Diagnosis of  By: Radene Ou MD, [place]    . Hypertension   . Hypertension   . HYPERTENSION 08/09/2007   Qualifier: Diagnosis of  By: Radene Ou MD, [place]      . Idiopathic peripheral autonomic neuropathy 03/27/2019  . Left lower quadrant pain 09/05/2018  . Left shoulder pain 02/05/2013  . Lipoma of abdominal wall 01/23/2012  . Migraines   . Nasal congestion   . Nicotine dependence 11/23/2018  . Pharyngeal dysphagia 12/16/2016  . Prediabetes 01/10/2013  . Rectal bleeding   . Scapular dyskinesis 03/26/2018  . SHINGLES, RECURRENT 09/30/2008   Qualifier: Diagnosis of  By: Radene Ou MD, [place]    . Spondylolisthesis, lumbar region 04/10/2019   Added automatically from request for surgery [id number]  . Sternal pain 03/26/2018  . Synovial cyst of lumbar facet joint 04/08/2019  . Urinary frequency   . Weight loss   . Wheezing     Past Surgical History:  Procedure Laterality Date  . APPENDECTOMY  1970's  . BACK SURGERY  04/2019  . COLONOSCOPY    . COLONOSCOPY  12/22/2011   Procedure: COLONOSCOPY;  Surgeon: Lear Ng, MD;  Location: [facility];  Service: Endoscopy;  Laterality: N/A;  . heart surgery at 55yr  1955   "blood was going thru my lungs but not thru my heart like it should" (05/09/2013)  . KNEE ARTHROSCOPY Left ~ 2004; ~ 2006  . LEFT OOPHORECTOMY Left ~ 1985  . LIPOMA EXCISION  02/15/2012   Procedure: EXCISION LIPOMA;  Surgeon: Joyice Faster. Cornett, MD;  Location:  [facility];  Service: General;  Laterality: Left;  excision lipoma Left Lower abdomen  . TOTAL KNEE ARTHROPLASTY Left 05/08/2013  . TOTAL KNEE ARTHROPLASTY Left 05/08/2013   Procedure: TOTAL KNEE ARTHROPLASTY;  Surgeon: Ninetta Lights, MD;  Location: [facility];  Service: Orthopedics;  Laterality: Left;  . TUBAL LIGATION Left ~ 1985  . TUMOR EXCISION Left ~ 1984   "under breast" (05/09/2013)    Current Medications: Current Meds  Medication Sig  . albuterol (PROVENTIL) (2.5 MG/3ML) 0.083% nebulizer solution Take 3 mLs (2.5 mg total) by nebulization every 6 (six) hours as needed for wheezing or shortness of breath.  Marland Kitchen albuterol (VENTOLIN HFA) 108 (90 Base) MCG/ACT inhaler  Can inhale two puffs every four to six hours as needed for cough or wheeze.  Marland Kitchen aspirin EC 81 MG tablet Take 81 mg by mouth daily.  Marland Kitchen azithromycin (ZITHROMAX) 250 MG tablet TAKE 2 TABLETS BY MOUTH ON DAY 1 AND THEN TAKE 1 TABLET BY MOUTH ONCE A DAY ON DAY 2 THROUGH DAY 5  . budesonide-formoterol (SYMBICORT) 80-4.5 MCG/ACT inhaler Symbicort 80 mcg-4.5 mcg/actuation HFA aerosol inhaler  . cyclobenzaprine (FLEXERIL) 10 MG tablet Take 10 mg by mouth at bedtime as needed.  . fluticasone (FLONASE) 50 MCG/ACT nasal spray   . folic acid (FOLVITE) 1 MG tablet Take 1 mg by mouth daily.  Marland Kitchen ipratropium (ATROVENT) 0.06 % nasal spray Can use two sprays in each nostril three to four times daily if needed for runny nose.  Marland Kitchen lisinopril-hydrochlorothiazide (ZESTORETIC) 20-12.5 MG tablet Take 1 tablet by mouth daily.  Marland Kitchen omeprazole (PRILOSEC) 40 MG capsule Take 40 mg by mouth daily.  . predniSONE (STERAPRED UNI-PAK 21 TAB) 5 MG (21) TBPK tablet TAKE 6 TABLETS ON DAY 1 THEN TAKE 5 TABLETS ON DAY 2 THEN TAKE 4 TABLETS ON DAY 3 THEN TAKE 3 TABLETS ON DAY 4 THEN TAKE 2 TABLETS ON DAY 5  . simvastatin (ZOCOR) 20 MG tablet Take 20 mg by mouth at bedtime.  . sucralfate (CARAFATE) 1 g tablet   . triamcinolone ointment (KENALOG) 0.1 % APPLY OINTMENT TOPICALLY TO AFFECTED AREA TWICE DAILYUSE ONLY AS NEEDED DO NOT USE REGULARLY OR DAILY  . VITAMIN D, CHOLECALCIFEROL, PO Take by mouth.  . [DISCONTINUED] furosemide (LASIX) 20 MG tablet Take 1 tab Mon and Fri  . [DISCONTINUED] potassium chloride SA (KLOR-CON) 20 MEQ tablet Take 1 tab Mon And Fridays     Allergies:   Sulfa antibiotics, Amoxicillin, Azithromycin, Clindamycin/lincomycin, Erythromycin, Penicillins, Propoxyphene n-acetaminophen, and Clarithromycin   Social History   Socioeconomic History  . Marital status: Divorced    Spouse name: Not on file  . Number of children: 2  . Years of education: Not on file  . Highest education level: Not on file  Occupational  History  . Occupation: cleaner    Comment: cleans apts and homes  Tobacco Use  . Smoking status: Former Smoker    Packs/day: 2.00    Years: 35.00    Pack years: 70.00    Types: Cigarettes    Quit date: 08/20/2013    Years since quitting: 6.4  . Smokeless tobacco: Never Used  . Tobacco comment: Currently exposed to 2nd hand smoke in home   Substance and Sexual Activity  . Alcohol use: No  . Drug use: No  . Sexual activity: Not on file  Other Topics Concern  . Not on file  Social History Narrative   Lives with her daughter's father.   Social Determinants  of Health   Financial Resource Strain:   . Difficulty of Paying Living Expenses: Not on file  Food Insecurity:   . Worried About Charity fundraiser in the Last Year: Not on file  . Ran Out of Food in the Last Year: Not on file  Transportation Needs:   . Lack of Transportation (Medical): Not on file  . Lack of Transportation (Non-Medical): Not on file  Physical Activity:   . Days of Exercise per Week: Not on file  . Minutes of Exercise per Session: Not on file  Stress:   . Feeling of Stress : Not on file  Social Connections:   . Frequency of Communication with Friends and Family: Not on file  . Frequency of Social Gatherings with Friends and Family: Not on file  . Attends Religious Services: Not on file  . Active Member of Clubs or Organizations: Not on file  . Attends Archivist Meetings: Not on file  . Marital Status: Not on file     Family History: The patient's family history includes Cancer in her father; Colon cancer in her father; Diabetes in her sister; Heart disease in her father and paternal uncle. There is no history of Malignant hyperthermia.  ROS:   Review of Systems  Constitution: Negative for decreased appetite, fever and weight gain.  Bilateral upper hip pain HENT: Negative for congestion, ear discharge, hoarse voice and sore throat.   Eyes: Negative for discharge, redness, vision loss in  right eye and visual halos.  Cardiovascular: Reports bilateral lower leg swelling.  Negative for chest pain, dyspnea on exertion,orthopnea and palpitations.  Respiratory: Negative for cough, hemoptysis, shortness of breath and snoring.   Endocrine: Negative for heat intolerance and polyphagia.  Hematologic/Lymphatic: Negative for bleeding problem. Does not bruise/bleed easily.  Skin: Negative for flushing, nail changes, rash and suspicious lesions.  Musculoskeletal: Negative for arthritis, joint pain, muscle cramps, myalgias, neck pain and stiffness.  Gastrointestinal: Negative for abdominal pain, bowel incontinence, diarrhea and excessive appetite.  Genitourinary: Negative for decreased libido, genital sores and incomplete emptying.  Neurological: Negative for brief paralysis, focal weakness, headaches and loss of balance.  Psychiatric/Behavioral: Negative for altered mental status, depression and suicidal ideas.  Allergic/Immunologic: Negative for HIV exposure and persistent infections.    EKGs/Labs/Other Studies Reviewed:    The following studies were reviewed today:   EKG: None today  Pharmacologic stress test:  Nuclear stress EF: 86%.  There was no ST segment deviation noted during stress.  No T wave inversion was noted during stress.  The study is normal.  This is a low risk study.  The left ventricular ejection fraction is hyperdynamic (>65%).  Transthoracic echocardiogram November 13, 2019 IMPRESSIONS  1. Left ventricular ejection fraction, by visual estimation, is 60 to  65%. The left ventricle has normal function. There is no left ventricular  hypertrophy.  2. Elevated left atrial pressure.  3. Left ventricular diastolic parameters are consistent with Grade I  diastolic dysfunction (impaired relaxation).  4. Global right ventricle has normal systolic function.The right  ventricular size is normal. No increase in right ventricular wall  thickness.  5. Left  atrial size was mildly dilated.  6. Right atrial size was normal.  7. The mitral valve is normal in structure. No evidence of mitral valve  regurgitation. No evidence of mitral stenosis.  8. The tricuspid valve is normal in structure. Tricuspid valve  regurgitation is not demonstrated.  9. The aortic valve is normal in structure. Aortic  valve regurgitation is  not visualized. No evidence of aortic valve sclerosis or stenosis.  10. The pulmonic valve was normal in structure. Pulmonic valve  regurgitation is not visualized.  11. The inferior vena cava is normal in size with greater than 50%  respiratory variability, suggesting right atrial pressure of 3 mmHg.  Recent Labs: 01/03/2020: BUN 17; Creatinine, Ser 0.54; Magnesium 2.0; Potassium 4.3; Sodium 134  Recent Lipid Panel    Component Value Date/Time   CHOL 277 (H) 04/30/2008 2259   TRIG 154 (H) 04/30/2008 2259   HDL 55 04/30/2008 2259   CHOLHDL 5.0 Ratio 04/30/2008 2259   VLDL 31 04/30/2008 2259   LDLCALC 191 (H) 04/30/2008 2259    Physical Exam:    VS:  BP 140/90 (BP Location: Left Arm, Patient Position: Sitting, Cuff Size: Normal)   Pulse 68   Ht 5' 1.4" (1.56 m)   Wt 169 lb (76.7 kg)   SpO2 97%   BMI 31.52 kg/m     Wt Readings from Last 3 Encounters:  02/14/20 169 lb (76.7 kg)  01/03/20 170 lb (77.1 kg)  12/03/19 174 lb 12.8 oz (79.3 kg)     GEN: Well nourished, well developed in no acute distress HEENT: Normal NECK: No JVD; No carotid bruits LYMPHATICS: No lymphadenopathy CARDIAC: S1S2 noted,RRR, no murmurs, rubs, gallops RESPIRATORY:  Clear to auscultation without rales, wheezing or rhonchi  ABDOMEN: Soft, non-tender, non-distended, +bowel sounds, no guarding. EXTREMITIES: Bilateral +1 leg edema, No cyanosis, no clubbing MUSCULOSKELETAL:  No deformity  SKIN: Warm and dry NEUROLOGIC:  Alert and oriented x 3, non-focal, +1 reproducible tenderness at the right hip where the sciatic nerve comes  out. PSYCHIATRIC:  Normal affect, good insight  ASSESSMENT:    1. Essential hypertension   2. Diabetes mellitus screening   3. Bilateral leg edema    PLAN:     1.  For bilateral leg edema I have asked the patient to restart her Lasix 20 mg daily.  With potassium supplement.  I will get blood work today which includes BMP, mag.  There is also physical varicose veins and I encouraged her to wear compression socks.  2.  She does have reproducible tenderness at the sciatic nerve output suggesting neuropathy.  Advised the patient to follow-up with her PCP for this new problem. Will also get HBA1c.    3.  She is also hypertensive in the office today so hopefully the increase of her Lasix will help her blood pressure as well.   The patient is in agreement with the above plan. The patient left the office in stable condition.  The patient will follow up in 1 month or sooner if needed.   Medication Adjustments/Labs and Tests Ordered: Current medicines are reviewed at length with the patient today.  Concerns regarding medicines are outlined above.  Orders Placed This Encounter  Procedures  . Basic Metabolic Panel (BMET)  . Magnesium  . HgB A1c   Meds ordered this encounter  Medications  . furosemide (LASIX) 20 MG tablet    Sig: Take 1 tablet (20 mg total) by mouth daily.    Dispense:  90 tablet    Refill:  1  . potassium chloride SA (KLOR-CON M20) 20 MEQ tablet    Sig: Take 1 tablet (20 mEq total) by mouth daily.    Dispense:  90 tablet    Refill:  1    Patient Instructions  Medication Instructions:  Your physician has recommended you make the following change  in your medication:   INCREASE: Furosemide 20 mg Take 1 tab daily INCREASE: Potassium 20 meq Take 1 tab daily  *If you need a refill on your cardiac medications before your next appointment, please call your pharmacy*   Lab Work: Your physician recommends that you return for lab work in:  [place]  If you have labs (blood work) drawn today and your tests are completely normal, you will receive your results only by: Marland Kitchen MyChart Message (if you have MyChart) OR . A paper copy in the mail If you have any lab test that is abnormal or we need to change your treatment, we will call you to review the results.   Testing/Procedures: None   Follow-Up: At [facility], you and your health needs are our priority.  As part of our continuing mission to provide you with exceptional heart care, we have created designated Provider Care Teams.  These Care Teams include your primary Cardiologist (physician) and Advanced Practice Providers (APPs -  Physician Assistants and Nurse Practitioners) who all work together to provide you with the care you need, when you need it.  We recommend signing up for the patient portal called "MyChart".  Sign up information is provided on this After Visit Summary.  MyChart is used to connect with patients for Virtual Visits (Telemedicine).  Patients are able to view lab/test results, encounter notes, upcoming appointments, etc.  Non-urgent messages can be sent to your provider as well.   To learn more about what you can do with MyChart, go to [email or web address].    Your next appointment:   1 month(s)  The format for your next appointment:   In Person  Provider:   Berniece Salines, DO   Other Instructions Please see your [facility] MD for you neuropathy pain. Continue to wear your support stockings.     Adopting a Healthy Lifestyle.  Know what a healthy weight is for you (roughly BMI <25) and aim to maintain this   Aim for 7+ servings of fruits and vegetables daily   65-80+ fluid ounces of water or unsweet tea for healthy kidneys   Limit to max 1 drink of alcohol per day; avoid smoking/tobacco   Limit animal fats in diet for cholesterol and heart health - choose grass fed whenever available   Avoid highly processed foods,  and foods high in saturated/trans fats   Aim for low stress - take time to unwind and care for your mental health   Aim for 150 min of moderate intensity exercise weekly for heart health, and weights twice weekly for bone health   Aim for 7-9 hours of sleep daily   When it comes to diets, agreement about the perfect plan isnt easy to find, even among the experts. Experts at the [employer] developed an idea known as the Healthy Eating Plate. Just imagine a plate divided into logical, healthy portions.   The emphasis is on diet quality:   Load up on vegetables and fruits - one-half of your plate: Aim for color and variety, and remember that potatoes dont count.   Go for whole grains - one-quarter of your plate: Whole wheat, barley, wheat berries, quinoa, oats, brown rice, and foods made with them. If you want pasta, go with whole wheat pasta.   Protein power - one-quarter of your plate: Fish, chicken, beans, and nuts are all healthy, versatile protein sources. Limit red meat.   The diet, however, does go beyond the plate, offering  a few other suggestions.   Use healthy plant oils, such as olive, canola, soy, corn, sunflower and peanut. Check the labels, and avoid partially hydrogenated oil, which have unhealthy trans fats.   If youre thirsty, drink water. Coffee and tea are good in moderation, but skip sugary drinks and limit milk and dairy products to one or two daily servings.   The type of carbohydrate in the diet is more important than the amount. Some sources of carbohydrates, such as vegetables, fruits, whole grains, and beans-are healthier than others.   Finally, stay active  Signed, Berniece Salines, DO  02/14/2020 4:10 PM    [facility]

## 2020-02-14 NOTE — Telephone Encounter (Signed)
   Pt c/o swelling: STAT is pt has developed SOB within 24 hours  1) How much weight have you gained and in what time span? Not Sure  2) If swelling, where is the swelling located? Left ankle down to her foot, pt said that it is color blue and purple right and her face getting reddish  3) Are you currently taking a fluid pill? Yes, took last Monday, she's afraid to take one today.  4) Are you currently SOB? No  5) Do you have a log of your daily weights (if so, list)? Not have one  6) Have you gained 3 pounds in a day or 5 pounds in a week? Not sure  7) Have you traveled recently? No

## 2020-02-14 NOTE — Telephone Encounter (Signed)
error 

## 2020-02-14 NOTE — Telephone Encounter (Signed)
I spoke with patient. She states she has pain in left leg above her knee and has bruise on her ankle. Noticed bruising when she woke up this morning. She reports she called the office and was told to go to office on [address].  She went there but no one was in the office. She is currently driving to the office in [place].

## 2020-02-15 LAB — BASIC METABOLIC PANEL
BUN/Creatinine Ratio: 27 (ref 12–28)
BUN: 17 mg/dL (ref 8–27)
CO2: 22 mmol/L (ref 20–29)
Calcium: 9.2 mg/dL (ref 8.7–10.3)
Chloride: 102 mmol/L (ref 96–106)
Creatinine, Ser: 0.62 mg/dL (ref 0.57–1.00)
GFR calc Af Amer: 105 mL/min/{1.73_m2} (ref >59)
GFR calc non Af Amer: 91 mL/min/{1.73_m2} (ref >59)
Glucose: 78 mg/dL (ref 65–99)
Potassium: 4.1 mmol/L (ref 3.5–5.2)
Sodium: 137 mmol/L (ref 134–144)

## 2020-02-15 LAB — HEMOGLOBIN A1C
Est. average glucose Bld gHb Est-mCnc: 120 mg/dL
Hgb A1c MFr Bld: 5.8 % — ABNORMAL HIGH (ref 4.8–5.6)

## 2020-02-15 LAB — MAGNESIUM: Magnesium: 2 mg/dL (ref 1.6–2.3)

## 2020-04-02 ENCOUNTER — Other Ambulatory Visit: Payer: Self-pay

## 2020-04-02 ENCOUNTER — Encounter: Payer: Self-pay | Admitting: Cardiology

## 2020-04-02 ENCOUNTER — Ambulatory Visit (INDEPENDENT_AMBULATORY_CARE_PROVIDER_SITE_OTHER): Payer: Medicare Other | Admitting: Cardiology

## 2020-04-02 VITALS — BP 100/64 | HR 76 | Ht 61.4 in | Wt 168.0 lb

## 2020-04-02 DIAGNOSIS — I5189 Other ill-defined heart diseases: Secondary | ICD-10-CM

## 2020-04-02 DIAGNOSIS — I5032 Chronic diastolic (congestive) heart failure: Secondary | ICD-10-CM

## 2020-04-02 DIAGNOSIS — Z131 Encounter for screening for diabetes mellitus: Secondary | ICD-10-CM | POA: Diagnosis not present

## 2020-04-02 DIAGNOSIS — E782 Mixed hyperlipidemia: Secondary | ICD-10-CM

## 2020-04-02 DIAGNOSIS — R600 Localized edema: Secondary | ICD-10-CM | POA: Diagnosis not present

## 2020-04-02 DIAGNOSIS — E669 Obesity, unspecified: Secondary | ICD-10-CM | POA: Insufficient documentation

## 2020-04-02 NOTE — Progress Notes (Signed)
Cardiology Office Note:    Date:  04/02/2020   ID:  Summer Jackson, DOB [birth year], MRN [id number]  PCP:  Bonnita Nasuti, MD  Cardiologist:  Berniece Salines, DO  Electrophysiologist:  None   Referring MD: Bonnita Nasuti, MD   Chief Complaint  Patient presents with  . Follow-up  Follow up for co  History of Present Illness:    Summer Jackson is a 74 y.o. female with a hx of hypertension, hyperlipidemia, prediabetes, chronic diastolic heart failure and diastolic dysfunction presents today for follow-up.  I last saw the patient February 14, 2020 at that time she came in as a walk-in due to bilateral leg edema.  She had stopped taking her Lasix therefore I restarted patient on her Lasix, at that visit blood work was done as well.  Today she is here for a follow up visit and she tells me that she has had significant improvement.  She denies any shortness of breath, lightheadedness or dizziness.   Past Medical History:  Diagnosis Date  . Allergy to pollen 05/29/2013  . Arthritis    "knees" (05/09/2013)  . Asthma   . Blood transfusion ~ 1955   "before heart OR" (05/09/2013)  . Chronic cough 01/24/2013  . COMMON MIGRAINE 08/09/2007   Qualifier: Diagnosis of  By: Radene Ou MD, [place]    . 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 Constipation 05/29/2013  . COPD (chronic obstructive pulmonary disease) ([place])   . Exertional shortness of breath   . Foreign body sensation in throat 12/16/2016  . Gastroesophageal reflux disease with esophagitis 12/16/2016  . GERD (gastroesophageal reflux disease)   . Headache 03/20/2018   migraine Overview:  Overview:  migraine migraine  . Headache(784.0)    migraine   . Hemorrhoids   . History of stomach ulcers 1990's  . Hyperlipemia   . HYPERLIPIDEMIA 08/25/2007   Qualifier: Diagnosis of  By: Radene Ou MD, [place]    . Hypertension   . Hypertension   . HYPERTENSION 08/09/2007   Qualifier: Diagnosis of  By: Radene Ou MD, [place]    . Idiopathic peripheral autonomic neuropathy 03/27/2019  . Left lower quadrant pain 09/05/2018  . Left shoulder pain 02/05/2013  . Lipoma of abdominal wall 01/23/2012  . Migraines   . Nasal congestion   . Nicotine dependence 11/23/2018  . Pharyngeal dysphagia 12/16/2016  . Prediabetes 01/10/2013  . Rectal bleeding   . Scapular dyskinesis 03/26/2018  . SHINGLES, RECURRENT 09/30/2008   Qualifier: Diagnosis of  By: Radene Ou MD, [place]    . Spondylolisthesis, lumbar region 04/10/2019   Added automatically from request for surgery [phone]  . Sternal pain 03/26/2018  . Synovial cyst of lumbar facet joint 04/08/2019  . Urinary frequency   . Weight loss   . Wheezing     Past Surgical History:  Procedure Laterality Date  . APPENDECTOMY  1970's  . BACK SURGERY  04/2019  . COLONOSCOPY    . COLONOSCOPY  12/22/2011   Procedure: COLONOSCOPY;  Surgeon: Lear Ng, MD;  Location: [facility];  Service: Endoscopy;  Laterality: N/A;  . heart surgery at 89yr  1955   "blood was going thru my lungs but not thru my heart like it should" (05/09/2013)  . KNEE ARTHROSCOPY Left ~ 2004; ~ 2006  . LEFT OOPHORECTOMY Left ~ 1985  . LIPOMA EXCISION  02/15/2012   Procedure: EXCISION LIPOMA;  Surgeon: Joyice Faster. Cornett,  MD;  Location: [facility];  Service: General;  Laterality: Left;  excision lipoma Left Lower abdomen  . TOTAL KNEE ARTHROPLASTY Left 05/08/2013  . TOTAL KNEE ARTHROPLASTY Left 05/08/2013   Procedure: TOTAL KNEE ARTHROPLASTY;  Surgeon: Ninetta Lights, MD;  Location: [facility];  Service: Orthopedics;  Laterality: Left;  . TUBAL LIGATION Left ~ 1985  . TUMOR EXCISION Left ~ 1984   "under breast" (05/09/2013)    Current Medications: Current Meds  Medication Sig  . albuterol (PROVENTIL) (2.5 MG/3ML) 0.083% nebulizer solution Take 3 mLs (2.5 mg total) by nebulization every 6 (six) hours as needed for wheezing or shortness of breath.  Marland Kitchen  albuterol (VENTOLIN HFA) 108 (90 Base) MCG/ACT inhaler Can inhale two puffs every four to six hours as needed for cough or wheeze.  Marland Kitchen aspirin EC 81 MG tablet Take 81 mg by mouth daily.  Marland Kitchen azithromycin (ZITHROMAX) 250 MG tablet TAKE 2 TABLETS BY MOUTH ON DAY 1 AND THEN TAKE 1 TABLET BY MOUTH ONCE A DAY ON DAY 2 THROUGH DAY 5  . budesonide-formoterol (SYMBICORT) 80-4.5 MCG/ACT inhaler Symbicort 80 mcg-4.5 mcg/actuation HFA aerosol inhaler  . cyclobenzaprine (FLEXERIL) 10 MG tablet Take 10 mg by mouth at bedtime as needed.  . fluticasone (FLONASE) 50 MCG/ACT nasal spray   . folic acid (FOLVITE) 1 MG tablet Take 1 mg by mouth daily.  . furosemide (LASIX) 20 MG tablet Take 1 tablet (20 mg total) by mouth daily.  Marland Kitchen ipratropium (ATROVENT) 0.06 % nasal spray Can use two sprays in each nostril three to four times daily if needed for runny nose.  Marland Kitchen lisinopril-hydrochlorothiazide (ZESTORETIC) 20-12.5 MG tablet Take 1 tablet by mouth daily.  Marland Kitchen omeprazole (PRILOSEC) 40 MG capsule Take 40 mg by mouth daily.  . potassium chloride SA (KLOR-CON M20) 20 MEQ tablet Take 1 tablet (20 mEq total) by mouth daily.  . predniSONE (STERAPRED UNI-PAK 21 TAB) 5 MG (21) TBPK tablet TAKE 6 TABLETS ON DAY 1 THEN TAKE 5 TABLETS ON DAY 2 THEN TAKE 4 TABLETS ON DAY 3 THEN TAKE 3 TABLETS ON DAY 4 THEN TAKE 2 TABLETS ON DAY 5  . simvastatin (ZOCOR) 20 MG tablet Take 20 mg by mouth at bedtime.  . sucralfate (CARAFATE) 1 g tablet   . triamcinolone ointment (KENALOG) 0.1 % APPLY OINTMENT TOPICALLY TO AFFECTED AREA TWICE DAILYUSE ONLY AS NEEDED DO NOT USE REGULARLY OR DAILY  . VITAMIN D, CHOLECALCIFEROL, PO Take by mouth.     Allergies:   Sulfa antibiotics, Amoxicillin, Azithromycin, Clindamycin/lincomycin, Erythromycin, Penicillins, Propoxyphene n-acetaminophen, and Clarithromycin   Social History   Socioeconomic History  . Marital status: Divorced    Spouse name: Not on file  . Number of children: 2  . Years of education: Not  on file  . Highest education level: Not on file  Occupational History  . Occupation: cleaner    Comment: cleans apts and homes  Tobacco Use  . Smoking status: Former Smoker    Packs/day: 2.00    Years: 35.00    Pack years: 70.00    Types: Cigarettes    Quit date: 08/20/2013    Years since quitting: 6.6  . Smokeless tobacco: Never Used  . Tobacco comment: Currently exposed to 2nd hand smoke in home   Substance and Sexual Activity  . Alcohol use: No  . Drug use: No  . Sexual activity: Not on file  Other Topics Concern  . Not on file  Social History Narrative   Lives  with her daughter's father.   Social Determinants of Health   Financial Resource Strain:   . Difficulty of Paying Living Expenses:   Food Insecurity:   . Worried About Charity fundraiser in the Last Year:   . Arboriculturist in the Last Year:   Transportation Needs:   . Film/video editor (Medical):   Marland Kitchen Lack of Transportation (Non-Medical):   Physical Activity:   . Days of Exercise per Week:   . Minutes of Exercise per Session:   Stress:   . Feeling of Stress :   Social Connections:   . Frequency of Communication with Friends and Family:   . Frequency of Social Gatherings with Friends and Family:   . Attends Religious Services:   . Active Member of Clubs or Organizations:   . Attends Archivist Meetings:   Marland Kitchen Marital Status:      Family History: The patient's family history includes Cancer in her father; Colon cancer in her father; Diabetes in her sister; Heart disease in her father and paternal uncle. There is no history of Malignant hyperthermia.  ROS:   Review of Systems  Constitution: Negative for decreased appetite, fever and weight gain.  HENT: Negative for congestion, ear discharge, hoarse voice and sore throat.   Eyes: Negative for discharge, redness, vision loss in right eye and visual halos.  Cardiovascular: Negative for chest pain, dyspnea on exertion, leg swelling, orthopnea  and palpitations.  Respiratory: Negative for cough, hemoptysis, shortness of breath and snoring.   Endocrine: Negative for heat intolerance and polyphagia.  Hematologic/Lymphatic: Negative for bleeding problem. Does not bruise/bleed easily.  Skin: Negative for flushing, nail changes, rash and suspicious lesions.  Musculoskeletal: Negative for arthritis, joint pain, muscle cramps, myalgias, neck pain and stiffness.  Gastrointestinal: Negative for abdominal pain, bowel incontinence, diarrhea and excessive appetite.  Genitourinary: Negative for decreased libido, genital sores and incomplete emptying.  Neurological: Negative for brief paralysis, focal weakness, headaches and loss of balance.  Psychiatric/Behavioral: Negative for altered mental status, depression and suicidal ideas.  Allergic/Immunologic: Negative for HIV exposure and persistent infections.    EKGs/Labs/Other Studies Reviewed:    The following studies were reviewed today:   EKG:  None today  Pharmacologic nuclear stress test 11/13/2019  Nuclear stress EF: 86%.  There was no ST segment deviation noted during stress.  No T wave inversion was noted during stress.  The study is normal.  This is a low risk study.  The left ventricular ejection fraction is hyperdynamic (>65%).  TTE IMPRESSIONS 11/13/2019 1. Left ventricular ejection fraction, by visual estimation, is 60 to 65%. The left ventricle has normal function. There is no left ventricular hypertrophy.  2. Elevated left atrial pressure.  3. Left ventricular diastolic parameters are consistent with Grade I diastolic dysfunction (impaired relaxation).  4. Global right ventricle has normal systolic function.The right ventricular size is normal. No increase in right ventricular wall thickness.  5. Left atrial size was mildly dilated.  6. Right atrial size was normal.  7. The mitral valve is normal in structure. No evidence of mitral valve  regurgitation. No  evidence of mitral stenosis.  8. The tricuspid valve is normal in structure. Tricuspid valve  regurgitation is not demonstrated.  9. The aortic valve is normal in structure. Aortic valve regurgitation is  not visualized. No evidence of aortic valve sclerosis or stenosis.  10. The pulmonic valve was normal in structure. Pulmonic valve  regurgitation is not visualized.  11. The  inferior vena cava is normal in size with greater than 50%  respiratory variability, suggesting right atrial pressure of 3 mmHg.  Recent Labs: 02/14/2020: BUN 17; Creatinine, Ser 0.62; Magnesium 2.0; Potassium 4.1; Sodium 137  Recent Lipid Panel    Component Value Date/Time   CHOL 277 (H) 04/30/2008 2259   TRIG 154 (H) 04/30/2008 2259   HDL 55 04/30/2008 2259   CHOLHDL 5.0 Ratio 04/30/2008 2259   VLDL 31 04/30/2008 2259   LDLCALC 191 (H) 04/30/2008 2259    Physical Exam:    VS:  BP 100/64 (BP Location: Right Arm, Patient Position: Sitting, Cuff Size: Large)   Pulse 76   Ht 5' 1.4" (1.56 m)   Wt 168 lb (76.2 kg)   SpO2 94%   BMI 31.33 kg/m     Wt Readings from Last 3 Encounters:  04/02/20 168 lb (76.2 kg)  02/14/20 169 lb (76.7 kg)  01/03/20 170 lb (77.1 kg)     GEN: Well nourished, well developed in no acute distress HEENT: Normal NECK: No JVD; No carotid bruits LYMPHATICS: No lymphadenopathy CARDIAC: S1S2 noted,RRR, no murmurs, rubs, gallops RESPIRATORY:  Clear to auscultation without rales, wheezing or rhonchi  ABDOMEN: Soft, non-tender, non-distended, +bowel sounds, no guarding. EXTREMITIES: No edema, No cyanosis, no clubbing MUSCULOSKELETAL:  No deformity  SKIN: Warm and dry NEUROLOGIC:  Alert and oriented x 3, non-focal PSYCHIATRIC:  Normal affect, good insight  ASSESSMENT:    1. Chronic heart failure with preserved ejection fraction ([place])   2. Diastolic dysfunction   3. Diabetes mellitus screening   4. Bilateral leg edema   5. Mixed hyperlipidemia   6. Obesity (BMI 30-39.9)     PLAN:     1.  She appears to be well compensated.  We will continue the patient on her current Lasix dosing.  I have advised her to stay taking her Lasix 20 mg daily along with her potassium supplements.  2.  Hypertension-her blood pressure is improved in the office we will continue patient on her current antihypertensive regimen.  3.  Bilateral leg edema has improved since starting her Lasix.  4.  Hyperlipidemia-continue patient her simvastatin. 5.  Obesity-patient was educated on increasing her exercise along with diet modification to help with her weight loss.  The patient is in agreement with the above plan. The patient left the office in stable condition.  The patient will follow up in   Medication Adjustments/Labs and Tests Ordered: Current medicines are reviewed at length with the patient today.  Concerns regarding medicines are outlined above.  No orders of the defined types were placed in this encounter.  No orders of the defined types were placed in this encounter.   Patient Instructions  Medication Instructions:  Your physician recommends that you continue on your current medications as directed. Please refer to the Current Medication list given to you today.  *If you need a refill on your cardiac medications before your next appointment, please call your pharmacy*   Lab Work: NONE  If you have labs (blood work) drawn today and your tests are completely normal, you will receive your results only by: Marland Kitchen MyChart Message (if you have MyChart) OR . A paper copy in the mail If you have any lab test that is abnormal or we need to change your treatment, we will call you to review the results.   Testing/Procedures: NONE    Follow-Up: At [facility], you and your health needs are our priority.  As part of  our continuing mission to provide you with exceptional heart care, we have created designated Provider Care Teams.  These Care Teams include your primary  Cardiologist (physician) and Advanced Practice Providers (APPs -  Physician Assistants and Nurse Practitioners) who all work together to provide you with the care you need, when you need it.  We recommend signing up for the patient portal called "MyChart".  Sign up information is provided on this After Visit Summary.  MyChart is used to connect with patients for Virtual Visits (Telemedicine).  Patients are able to view lab/test results, encounter notes, upcoming appointments, etc.  Non-urgent messages can be sent to your provider as well.   To learn more about what you can do with MyChart, go to [email or web address].    Your next appointment:   6 month(s)  The format for your next appointment:   In Person  Provider:   Berniece Salines, DO   Other Instructions Thank you for choosing [facility]!       Adopting a Healthy Lifestyle.  Know what a healthy weight is for you (roughly BMI <25) and aim to maintain this   Aim for 7+ servings of fruits and vegetables daily   65-80+ fluid ounces of water or unsweet tea for healthy kidneys   Limit to max 1 drink of alcohol per day; avoid smoking/tobacco   Limit animal fats in diet for cholesterol and heart health - choose grass fed whenever available   Avoid highly processed foods, and foods high in saturated/trans fats   Aim for low stress - take time to unwind and care for your mental health   Aim for 150 min of moderate intensity exercise weekly for heart health, and weights twice weekly for bone health   Aim for 7-9 hours of sleep daily   When it comes to diets, agreement about the perfect plan isnt easy to find, even among the experts. Experts at the [employer] developed an idea known as the Healthy Eating Plate. Just imagine a plate divided into logical, healthy portions.   The emphasis is on diet quality:   Load up on vegetables and fruits - one-half of your plate: Aim for color and variety, and  remember that potatoes dont count.   Go for whole grains - one-quarter of your plate: Whole wheat, barley, wheat berries, quinoa, oats, brown rice, and foods made with them. If you want pasta, go with whole wheat pasta.   Protein power - one-quarter of your plate: Fish, chicken, beans, and nuts are all healthy, versatile protein sources. Limit red meat.   The diet, however, does go beyond the plate, offering a few other suggestions.   Use healthy plant oils, such as olive, canola, soy, corn, sunflower and peanut. Check the labels, and avoid partially hydrogenated oil, which have unhealthy trans fats.   If youre thirsty, drink water. Coffee and tea are good in moderation, but skip sugary drinks and limit milk and dairy products to one or two daily servings.   The type of carbohydrate in the diet is more important than the amount. Some sources of carbohydrates, such as vegetables, fruits, whole grains, and beans-are healthier than others.   Finally, stay active  Signed, Berniece Salines, DO  04/02/2020 10:23 PM     [facility]

## 2020-04-02 NOTE — Patient Instructions (Signed)
Medication Instructions:  Your physician recommends that you continue on your current medications as directed. Please refer to the Current Medication list given to you today.  *If you need a refill on your cardiac medications before your next appointment, please call your pharmacy*   Lab Work: NONE  If you have labs (blood work) drawn today and your tests are completely normal, you will receive your results only by: Marland Kitchen MyChart Message (if you have MyChart) OR . A paper copy in the mail If you have any lab test that is abnormal or we need to change your treatment, we will call you to review the results.   Testing/Procedures: NONE    Follow-Up: At [facility], you and your health needs are our priority.  As part of our continuing mission to provide you with exceptional heart care, we have created designated Provider Care Teams.  These Care Teams include your primary Cardiologist (physician) and Advanced Practice Providers (APPs -  Physician Assistants and Nurse Practitioners) who all work together to provide you with the care you need, when you need it.  We recommend signing up for the patient portal called "MyChart".  Sign up information is provided on this After Visit Summary.  MyChart is used to connect with patients for Virtual Visits (Telemedicine).  Patients are able to view lab/test results, encounter notes, upcoming appointments, etc.  Non-urgent messages can be sent to your provider as well.   To learn more about what you can do with MyChart, go to [email or web address].    Your next appointment:   6 month(s)  The format for your next appointment:   In Person  Provider:   Berniece Salines, DO   Other Instructions Thank you for choosing [facility]!

## 2020-04-07 ENCOUNTER — Ambulatory Visit: Payer: Medicare Other | Admitting: Allergy

## 2020-04-08 ENCOUNTER — Other Ambulatory Visit: Payer: Self-pay | Admitting: Allergy

## 2020-07-29 ENCOUNTER — Other Ambulatory Visit: Payer: Self-pay

## 2020-07-29 DIAGNOSIS — I8390 Asymptomatic varicose veins of unspecified lower extremity: Secondary | ICD-10-CM

## 2020-08-07 ENCOUNTER — Ambulatory Visit: Payer: Medicare Other | Admitting: Podiatry

## 2020-08-13 ENCOUNTER — Inpatient Hospital Stay (HOSPITAL_COMMUNITY): Admission: RE | Admit: 2020-08-13 | Payer: Medicare Other | Source: Ambulatory Visit

## 2020-09-03 ENCOUNTER — Encounter: Payer: Self-pay | Admitting: Physician Assistant

## 2020-10-06 ENCOUNTER — Ambulatory Visit: Payer: Medicare Other | Admitting: Physician Assistant

## 2020-10-07 ENCOUNTER — Encounter: Payer: Self-pay | Admitting: Family

## 2020-12-19 DIAGNOSIS — I82409 Acute embolism and thrombosis of unspecified deep veins of unspecified lower extremity: Secondary | ICD-10-CM

## 2020-12-19 HISTORY — DX: Acute embolism and thrombosis of unspecified deep veins of unspecified lower extremity: I82.409

## 2021-01-15 ENCOUNTER — Ambulatory Visit (INDEPENDENT_AMBULATORY_CARE_PROVIDER_SITE_OTHER): Payer: Medicare Other | Admitting: Podiatry

## 2021-01-15 ENCOUNTER — Encounter: Payer: Self-pay | Admitting: Podiatry

## 2021-01-15 ENCOUNTER — Other Ambulatory Visit: Payer: Self-pay

## 2021-01-15 DIAGNOSIS — L600 Ingrowing nail: Secondary | ICD-10-CM | POA: Diagnosis not present

## 2021-01-15 NOTE — Patient Instructions (Signed)

## 2021-01-17 NOTE — Progress Notes (Signed)
Subjective:   Patient ID: Summer Jackson, female   DOB: 75 y.o.   MRN: [id number]   HPI Patient presents stating she has painful ingrown toenails on both her big toes and they make it hard for her to wear shoe gear comfortably.  States she is tried to trim it and soak it without relief and she is looking for a permanent type correction.  Patient does not smoke likes to be active   Review of Systems  All other systems reviewed and are negative.       Objective:  Physical Exam Vitals and nursing note reviewed.  Constitutional:      Appearance: She is well-developed and well-nourished.  Cardiovascular:     Pulses: Intact distal pulses.  Pulmonary:     Effort: Pulmonary effort is normal.  Musculoskeletal:        General: Normal range of motion.  Skin:    General: Skin is warm.  Neurological:     Mental Status: She is alert.     Neurovascular status found to be intact muscle strength found to be adequate range of motion adequate.  Patient is found to have incurvated medial borders of the hallux bilateral that are painful when pressed with no active drainage or redness noted.  Patient is found to have good digital perfusion well oriented x3     Assessment:  Chronic ingrown toenail deformity hallux bilateral that are painful when pressed with no indication of infection     Plan:  H&P reviewed condition and discussed options.  Patient wants surgery and I went ahead today and I allowed her to go over consent form for surgical intervention.  I explained procedure risk and patient wants surgery and at this time I infiltrated each hallux 60 mg like Marcaine mixture sterile prep done and using sterile instrumentation remove the border exposed matrix applied phenol 3 applications 30 seconds followed by alcohol lavage sterile dressing.  Gave instructions on soaks and to leave dressings on 24 hours but take them off earlier if any throbbing were to occur and patient is encouraged to call  questions concerns which may arise

## 2021-01-28 ENCOUNTER — Telehealth: Payer: Self-pay | Admitting: Cardiology

## 2021-01-28 NOTE — Telephone Encounter (Signed)
Pt came in today wanting appt for Rt leg swelling, wanted an appt for today, settled for an opening tomorrow. As she left she said she wanted Dr. Harriet Masson to call her in "something for fluid". Relaying that message to you!   Thank you

## 2021-01-28 NOTE — Telephone Encounter (Signed)
Please check with the patient because I just got a message that she wants to switch providers.  That way we can get her set up with the provider of her choice.  She prefers to see Dr. Marlou Porch in [place].

## 2021-01-28 NOTE — Telephone Encounter (Signed)
That will be fine with me. 

## 2021-01-28 NOTE — Telephone Encounter (Signed)
Patient is requesting to switch providers from Dr. Harriet Masson to Dr. Marlou Porch. Please advise. Thank you.

## 2021-01-29 ENCOUNTER — Ambulatory Visit: Payer: Medicare Other | Admitting: Cardiology

## 2021-01-29 NOTE — Telephone Encounter (Signed)
Left message for patient to return our call.

## 2021-01-29 NOTE — Telephone Encounter (Signed)
OK with me Candee Furbish, MD

## 2021-02-02 NOTE — Telephone Encounter (Signed)
Left message for patient to return our call.

## 2021-02-03 NOTE — Telephone Encounter (Signed)
Left message for patient to return our call.

## 2021-03-03 ENCOUNTER — Telehealth: Payer: Self-pay | Admitting: Cardiology

## 2021-03-03 NOTE — Telephone Encounter (Signed)
New Message:     Pt would like to switch from Dr Harriet Masson to Dr Audie Box. Is this alright with both of you?

## 2021-03-03 NOTE — Telephone Encounter (Signed)
That will be fine with me. 

## 2021-03-03 NOTE — Telephone Encounter (Signed)
I did not need this encounter. °

## 2021-03-03 NOTE — Telephone Encounter (Signed)
That is fine with me.   [place] T. Audie Box, MD, [place]  [address] [place], [place] [address] [phone]  2:11 PM

## 2021-03-05 ENCOUNTER — Other Ambulatory Visit: Payer: Self-pay

## 2021-03-05 ENCOUNTER — Encounter: Payer: Self-pay | Admitting: Cardiovascular Disease

## 2021-03-05 ENCOUNTER — Ambulatory Visit (INDEPENDENT_AMBULATORY_CARE_PROVIDER_SITE_OTHER): Payer: Medicare Other | Admitting: Cardiovascular Disease

## 2021-03-05 VITALS — BP 118/70 | HR 80 | Ht 61.0 in | Wt 169.8 lb

## 2021-03-05 DIAGNOSIS — I5032 Chronic diastolic (congestive) heart failure: Secondary | ICD-10-CM

## 2021-03-05 DIAGNOSIS — I6523 Occlusion and stenosis of bilateral carotid arteries: Secondary | ICD-10-CM | POA: Diagnosis not present

## 2021-03-05 DIAGNOSIS — E782 Mixed hyperlipidemia: Secondary | ICD-10-CM | POA: Diagnosis not present

## 2021-03-05 DIAGNOSIS — I872 Venous insufficiency (chronic) (peripheral): Secondary | ICD-10-CM

## 2021-03-05 DIAGNOSIS — I10 Essential (primary) hypertension: Secondary | ICD-10-CM

## 2021-03-05 MED ORDER — FUROSEMIDE 20 MG PO TABS: 20.0000 mg | ORAL_TABLET | Freq: Every day | ORAL | 1 refills | Status: DC | PRN

## 2021-03-05 NOTE — Patient Instructions (Signed)
Medication Instructions:  The current medical regimen is effective;  continue present plan and medications.  *If you need a refill on your cardiac medications before your next appointment, please call your pharmacy*   Follow-Up: At [facility], you and your health needs are our priority.  As part of our continuing mission to provide you with exceptional heart care, we have created designated Provider Care Teams.  These Care Teams include your primary Cardiologist (physician) and Advanced Practice Providers (APPs -  Physician Assistants and Nurse Practitioners) who all work together to provide you with the care you need, when you need it.  We recommend signing up for the patient portal called "MyChart".  Sign up information is provided on this After Visit Summary.  MyChart is used to connect with patients for Virtual Visits (Telemedicine).  Patients are able to view lab/test results, encounter notes, upcoming appointments, etc.  Non-urgent messages can be sent to your provider as well.   To learn more about what you can do with MyChart, go to [email or web address].    Your next appointment:   6 month(s)  The format for your next appointment:   In Person  Provider:   Vandalia O'Neal, MD      

## 2021-03-05 NOTE — Progress Notes (Signed)
Cardiology Office Note:   Date:  03/05/2021  NAME:  Summer Jackson    MRN: [id number] DOB:  08-Apr-1946   PCP:  Bonnita Nasuti, MD  Cardiologist:  Berniece Salines, DO  Electrophysiologist:  None   Referring MD: Bonnita Nasuti, MD   Chief Complaint  Patient presents with  . Congestive Heart Failure   History of Present Illness:   Summer Jackson is a 75 y.o. female with a hx of HFpEF, HTN, HLD who presents for follow-up.  She presents for further evaluation.  She apparently was seen in the [facility] and told she had hardening of her carotid arteries.  I did review her recent vascular ultrasound shows minimal narrowing of the carotid arteries bilaterally.  She has no strokelike symptoms that are concerning.  She also has venous insufficiency.  She also take Lasix daily as needed.  She reports that her legs swell intermittently.  They are worse at the end of the day.  They are also worse with salty food consumption.  She seems to be okay today.  I recommended salt reductive strategies as well as Lasix as needed.  She also should wear compression stockings.  She does report some pain in her chest.  It is worse with palpation.  Appears to be musculoskeletal in nature.  She has no alarming features.  No structured exercise in the morning.  She is a former smoker.  She has mild COPD.  She denies any significant chest pain or shortness of breath in the office today.  She seems to be euvolemic on my examination.  Problem List 1. HFpEF -negative MPI 11/13/2019 -EF 60-65%, G1DD 2. HTN 3. HLD 4. Carotid artery disease  -1-39% bilateral  5. COPD 6. Venous insufficiency   Past Medical History: Past Medical History:  Diagnosis Date  . Allergy to pollen 05/29/2013  . Arthritis    "knees" (05/09/2013)  . Asthma   . Blood transfusion ~ 1955   "before heart OR" (05/09/2013)  . Chronic cough 01/24/2013  . COMMON MIGRAINE 08/09/2007   Qualifier: Diagnosis of  By: Radene Ou MD, [place]    .  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 Constipation 05/29/2013  . COPD (chronic obstructive pulmonary disease) ([place])   . Exertional shortness of breath   . Foreign body sensation in throat 12/16/2016  . Gastroesophageal reflux disease with esophagitis 12/16/2016  . GERD (gastroesophageal reflux disease)   . Headache 03/20/2018   migraine Overview:  Overview:  migraine migraine  . Headache(784.0)    migraine   . Hemorrhoids   . History of stomach ulcers 1990's  . Hyperlipemia   . HYPERLIPIDEMIA 08/25/2007   Qualifier: Diagnosis of  By: Radene Ou MD, [place]    . Hypertension   . Hypertension   . HYPERTENSION 08/09/2007   Qualifier: Diagnosis of  By: Radene Ou MD, [place]    . Idiopathic peripheral autonomic neuropathy 03/27/2019  . Left lower quadrant pain 09/05/2018  . Left shoulder pain 02/05/2013  . Lipoma of abdominal wall 01/23/2012  . Migraines   . Nasal congestion   . Nicotine dependence 11/23/2018  . Pharyngeal dysphagia 12/16/2016  . Prediabetes 01/10/2013  . Rectal bleeding   . Scapular dyskinesis 03/26/2018  . SHINGLES, RECURRENT 09/30/2008   Qualifier: Diagnosis of  By: Radene Ou MD, [place]    . Spondylolisthesis, lumbar region 04/10/2019   Added automatically from request for surgery [phone]  .  Sternal pain 03/26/2018  . Synovial cyst of lumbar facet joint 04/08/2019  . Urinary frequency   . Weight loss   . Wheezing     Past Surgical History: Past Surgical History:  Procedure Laterality Date  . APPENDECTOMY  1970's  . BACK SURGERY  04/2019  . COLONOSCOPY    . COLONOSCOPY  12/22/2011   Procedure: COLONOSCOPY;  Surgeon: Lear Ng, MD;  Location: [facility];  Service: Endoscopy;  Laterality: N/A;  . heart surgery at 82yr  1955   "blood was going thru my lungs but not thru my heart like it should" (05/09/2013)  . KNEE ARTHROSCOPY Left ~ 2004; ~ 2006  . LEFT OOPHORECTOMY Left ~ 1985  . LIPOMA  EXCISION  02/15/2012   Procedure: EXCISION LIPOMA;  Surgeon: Joyice Faster. Cornett, MD;  Location: [facility];  Service: General;  Laterality: Left;  excision lipoma Left Lower abdomen  . TOTAL KNEE ARTHROPLASTY Left 05/08/2013  . TOTAL KNEE ARTHROPLASTY Left 05/08/2013   Procedure: TOTAL KNEE ARTHROPLASTY;  Surgeon: Ninetta Lights, MD;  Location: [facility];  Service: Orthopedics;  Laterality: Left;  . TUBAL LIGATION Left ~ 1985  . TUMOR EXCISION Left ~ 1984   "under breast" (05/09/2013)    Current Medications: Current Meds  Medication Sig  . albuterol (PROVENTIL) (2.5 MG/3ML) 0.083% nebulizer solution Take 3 mLs (2.5 mg total) by nebulization every 6 (six) hours as needed for wheezing or shortness of breath.  Marland Kitchen albuterol (VENTOLIN HFA) 108 (90 Base) MCG/ACT inhaler Can inhale two puffs every four to six hours as needed for cough or wheeze.  Marland Kitchen aspirin EC 81 MG tablet Take 81 mg by mouth daily.  . budesonide-formoterol (SYMBICORT) 80-4.5 MCG/ACT inhaler Symbicort 80 mcg-4.5 mcg/actuation HFA aerosol inhaler  . cyclobenzaprine (FLEXERIL) 10 MG tablet Take 10 mg by mouth at bedtime as needed.  . fluticasone (FLONASE) 50 MCG/ACT nasal spray   . folic acid (FOLVITE) 1 MG tablet Take 1 mg by mouth daily.  Marland Kitchen lisinopril-hydrochlorothiazide (ZESTORETIC) 20-12.5 MG tablet Take 1 tablet by mouth daily.  . meloxicam (MOBIC) 15 MG tablet Take 15 mg by mouth daily as needed.  Marland Kitchen omeprazole (PRILOSEC) 40 MG capsule Take 40 mg by mouth daily.  . simvastatin (ZOCOR) 20 MG tablet Take 20 mg by mouth at bedtime.  . sucralfate (CARAFATE) 1 g tablet   . triamcinolone ointment (KENALOG) 0.1 % APPLY OINTMENT TOPICALLY TO AFFECTED AREA TWICE DAILYUSE ONLY AS NEEDED DO NOT USE REGULARLY OR DAILY  . VITAMIN D, CHOLECALCIFEROL, PO Take by mouth.  . [DISCONTINUED] furosemide (LASIX) 20 MG tablet Take 1 tablet (20 mg total) by mouth daily.     Allergies:    Sulfa antibiotics, Amoxicillin, Azithromycin,  Clindamycin/lincomycin, Erythromycin, Penicillins, Propoxyphene n-acetaminophen, and Clarithromycin   Social History: Social History   Socioeconomic History  . Marital status: Divorced    Spouse name: Not on file  . Number of children: 2  . Years of education: Not on file  . Highest education level: Not on file  Occupational History  . Occupation: cleaner    Comment: cleans apts and homes  Tobacco Use  . Smoking status: Former Smoker    Packs/day: 2.00    Years: 35.00    Pack years: 70.00    Types: Cigarettes    Quit date: 08/20/2013    Years since quitting: 7.5  . Smokeless tobacco: Never Used  . Tobacco comment: Currently exposed to 2nd hand smoke in home   Substance and  Sexual Activity  . Alcohol use: No  . Drug use: No  . Sexual activity: Not on file  Other Topics Concern  . Not on file  Social History Narrative   Lives with her daughter's father.   Social Determinants of Health   Financial Resource Strain: Not on file  Food Insecurity: Not on file  Transportation Needs: Not on file  Physical Activity: Not on file  Stress: Not on file  Social Connections: Not on file     Family History: The patient's family history includes Cancer in her father; Colon cancer in her father; Diabetes in her sister; Heart disease in her father and paternal uncle. There is no history of Malignant hyperthermia.  ROS:   All other ROS reviewed and negative. Pertinent positives noted in the HPI.     EKGs/Labs/Other Studies Reviewed:   The following studies were personally reviewed by me today:  NM Stress 11/13/2019  Nuclear stress EF: 86%.  There was no ST segment deviation noted during stress.  No T wave inversion was noted during stress.  The study is normal.  This is a low risk study.  The left ventricular ejection fraction is hyperdynamic (>65%).  TTE 11/13/2019 1. Left ventricular ejection fraction, by visual estimation, is 60 to  65%. The left ventricle has normal  function. There is no left ventricular  hypertrophy.  2. Elevated left atrial pressure.  3. Left ventricular diastolic parameters are consistent with Grade I  diastolic dysfunction (impaired relaxation).  4. Global right ventricle has normal systolic function.The right  ventricular size is normal. No increase in right ventricular wall  thickness.  5. Left atrial size was mildly dilated.  6. Right atrial size was normal.  7. The mitral valve is normal in structure. No evidence of mitral valve  regurgitation. No evidence of mitral stenosis.  8. The tricuspid valve is normal in structure. Tricuspid valve  regurgitation is not demonstrated.  9. The aortic valve is normal in structure. Aortic valve regurgitation is  not visualized. No evidence of aortic valve sclerosis or stenosis.  10. The pulmonic valve was normal in structure. Pulmonic valve  regurgitation is not visualized.  11. The inferior vena cava is normal in size with greater than 50%  respiratory variability, suggesting right atrial pressure of 3 mmHg.   Recent Labs: No results found for requested labs within last 8760 hours.   Recent Lipid Panel    Component Value Date/Time   CHOL 277 (H) 04/30/2008 2259   TRIG 154 (H) 04/30/2008 2259   HDL 55 04/30/2008 2259   CHOLHDL 5.0 Ratio 04/30/2008 2259   VLDL 31 04/30/2008 2259   LDLCALC 191 (H) 04/30/2008 2259    Physical Exam:   VS:  BP 118/70 (BP Location: Left Arm, Patient Position: Sitting)   Pulse 80   Ht 5\' 1"  (1.549 m)   Wt 169 lb 12.8 oz (77 kg)   SpO2 99%   BMI 32.08 kg/m    Wt Readings from Last 3 Encounters:  03/05/21 169 lb 12.8 oz (77 kg)  04/02/20 168 lb (76.2 kg)  02/14/20 169 lb (76.7 kg)    General: Well nourished, well developed, in no acute distress Head: Atraumatic, normal size  Eyes: PEERLA, EOMI  Neck: Supple, no JVD Endocrine: No thryomegaly Cardiac: Normal S1, S2; RRR; no murmurs, rubs, or gallops Lungs: Clear to auscultation  bilaterally, no wheezing, rhonchi or rales  Abd: Soft, nontender, no hepatomegaly  Ext: No edema, pulses 2+, trace edema, venous  insufficiency changes noted Musculoskeletal: No deformities, BUE and BLE strength normal and equal Skin: Warm and dry, no rashes   Neuro: Alert and oriented to person, place, time, and situation, CNII-XII grossly intact, no focal deficits  Psych: Normal mood and affect   ASSESSMENT:   Summer Jackson is a 75 y.o. female who presents for the following: 1. Chronic diastolic heart failure ([place])   2. Primary hypertension   3. Mixed hyperlipidemia   4. Bilateral carotid artery stenosis   5. Venous insufficiency     PLAN:   1. Chronic diastolic heart failure (H[facility]) -Euvolemic on examination.  Echocardiogram recently shows normal LV function with grade 1 diastolic dysfunction.  She had a negative nuclear medicine stress test.  No symptoms concerning for angina. -Her edema appears to be more chronic venous insufficiency than anything.  She can continue with Lasix 20 mg daily as needed.  Blood pressure well controlled.  She not diabetic.  2. Primary hypertension -No change in medications.  3. Mixed hyperlipidemia -She is on simvastatin.  Needs a repeat lipid profile at some point.  Hopefully she can get this done when she sees her back in 6 months we can go over the results.  4. Bilateral carotid artery stenosis -Minimal 1-39% bilateral stenoses.  No strokelike symptoms.  Continue aspirin and statin.  5. Venous insufficiency -Venous insufficiency on examination.  Continue with salt reductive strategies as well as compression stockings.  She also will take Lasix as needed.  Disposition: Return in about 6 months (around 09/05/2021).  Medication Adjustments/Labs and Tests Ordered: Current medicines are reviewed at length with the patient today.  Concerns regarding medicines are outlined above.  No orders of the defined types were placed in this encounter.  Meds  ordered this encounter  Medications  . furosemide (LASIX) 20 MG tablet    Sig: Take 1 tablet (20 mg total) by mouth daily as needed.    Dispense:  90 tablet    Refill:  1    Patient Instructions  Medication Instructions:  The current medical regimen is effective;  continue present plan and medications.  *If you need a refill on your cardiac medications before your next appointment, please call your pharmacy*   Follow-Up: At [facility], you and your health needs are our priority.  As part of our continuing mission to provide you with exceptional heart care, we have created designated Provider Care Teams.  These Care Teams include your primary Cardiologist (physician) and Advanced Practice Providers (APPs -  Physician Assistants and Nurse Practitioners) who all work together to provide you with the care you need, when you need it.  We recommend signing up for the patient portal called "MyChart".  Sign up information is provided on this After Visit Summary.  MyChart is used to connect with patients for Virtual Visits (Telemedicine).  Patients are able to view lab/test results, encounter notes, upcoming appointments, etc.  Non-urgent messages can be sent to your provider as well.   To learn more about what you can do with MyChart, go to [email or web address].    Your next appointment:   6 month(s)  The format for your next appointment:   In Person  Provider:   Eleonore Chiquito, MD       Time Spent with Patient: I have spent a total of 25 minutes with patient reviewing hospital notes, telemetry, EKGs, labs and examining the patient as well as establishing an assessment and plan that was discussed with the patient.  >  50% of time was spent in direct patient care.  Signed, Addison Naegeli. Audie Box, MD, [place]  [address] [place], [place] [address] [phone]  03/05/2021 3:27 PM

## 2021-05-10 ENCOUNTER — Ambulatory Visit: Payer: Medicare Other | Admitting: Cardiology

## 2021-06-03 ENCOUNTER — Ambulatory Visit (INDEPENDENT_AMBULATORY_CARE_PROVIDER_SITE_OTHER): Payer: Medicare Other | Admitting: Podiatry

## 2021-06-03 ENCOUNTER — Other Ambulatory Visit: Payer: Self-pay

## 2021-06-03 DIAGNOSIS — L84 Corns and callosities: Secondary | ICD-10-CM

## 2021-06-03 DIAGNOSIS — M79672 Pain in left foot: Secondary | ICD-10-CM | POA: Diagnosis not present

## 2021-06-03 DIAGNOSIS — M79671 Pain in right foot: Secondary | ICD-10-CM | POA: Diagnosis not present

## 2021-06-03 DIAGNOSIS — M779 Enthesopathy, unspecified: Secondary | ICD-10-CM | POA: Diagnosis not present

## 2021-06-03 NOTE — Patient Instructions (Signed)
You can use voltaren gel on the right foot On the left foot keep a small amount of moisturizer on the callus and you can use a toe cap on the toe to help take the pressure off.

## 2021-06-08 NOTE — Progress Notes (Signed)
Subjective: 75 year old female presents the office today for concerns of discomfort, particular aspect of the toe.  She notes a small "blister"at the tip of the toe as well.  Denies any open lesion drainage or pus.  Gets occasional discomfort to the right foot as well.  No recent injury or trauma that she reports no increase in swelling. Denies any systemic complaints such as fevers, chills, nausea, vomiting. No acute changes since last appointment, and no other complaints at this time.   Objective: AAO x3, NAD DP/PT pulses palpable bilaterally, CRT less than 3 seconds Hyperkeratotic lesion present of the left toe.  There is no underlying ulceration drainage or signs of infection.  There is no blister formation identified today.   On the right foot there is no area of pinpoint tenderness.  Hallux varus is present with hammertoe deformity.  Flexor and extensor tendons appear to be intact.  MMT 5/5. No pain with calf compression, swelling, warmth, erythema  Assessment: Hyperkeratotic lesion left foot; right foot pain  Plan: -All treatment options discussed with the patient including all alternatives, risks, complications.  -X-rays obtained reviewed in the left foot.  No evidence of acute fracture.  Hallux varus is present with medial deviation of the digits. -Debrided the hyperkeratotic lesion with any complications or bleeding.  Recommend moisturizer and offloading.  Dispensed offloading pads. -Voltaren gel for the right foot as needed. -Patient encouraged to call the office with any questions, concerns, change in symptoms.   Trula Slade DPM

## 2021-06-15 ENCOUNTER — Other Ambulatory Visit: Payer: Self-pay

## 2021-06-15 ENCOUNTER — Other Ambulatory Visit (HOSPITAL_COMMUNITY): Payer: Self-pay | Admitting: Physician Assistant

## 2021-06-15 ENCOUNTER — Ambulatory Visit (HOSPITAL_COMMUNITY)
Admission: RE | Admit: 2021-06-15 | Discharge: 2021-06-15 | Disposition: A | Discharge status: Home / Self Care | Payer: Medicare Other | Source: Ambulatory Visit | Attending: Internal Medicine | Admitting: Internal Medicine

## 2021-06-15 DIAGNOSIS — M79604 Pain in right leg: Secondary | ICD-10-CM | POA: Diagnosis present

## 2021-07-28 ENCOUNTER — Telehealth: Payer: Self-pay

## 2021-07-28 NOTE — Telephone Encounter (Signed)
   [facility] Pre-operative Risk Assessment    Patient Name: Summer Jackson  DOB: Oct 07, 1946 MRN: [id number]   Request for surgical clearance:  What type of surgery is being performed   Right total knee arthroplasty  When is this surgery scheduled  TBD  What type of clearance is required  Both  Are there any medications that need to be held prior to surgery and how long  Aspirin  Practice name and name of physician performing surgery [facility]  Dr.Frank Aluisio   What is the office phone number  [phone]   7.   What is the office fax number         [phone]  8.   Anesthesia type  Choice   Kathyrn Lass 07/28/2021, 3:00 PM  _________________________________________________________________   (provider comments below)

## 2021-07-29 NOTE — Telephone Encounter (Signed)
I s/w the pt and asked if she would like a sooner appt with some of the symptoms she has been having as well as for pre op clearance . Pt agreed and would like sooner appt. Pt has been scheduled to see Almyra Deforest, [facility] 08/25/21 @ 9:15, see notes from pre op provider today Melina Copa, PAC. I will forward these notes to [facility] for upcoming appt. Will send FYI to surgeon's office pt has appt 08/25/21.

## 2021-07-29 NOTE — Telephone Encounter (Signed)
   Name:  Summer Jackson  DOB:  1946/05/21  MRN:  [id number]   Primary Cardiologist: Berniece Salines, DO / more recently Dr. Eleonore Chiquito.  Chart reviewed as part of pre-operative protocol coverage. Patient was contacted 07/29/2021 in reference to pre-operative risk assessment for pending surgery as outlined below.  BALIE HERRY was last seen on 02/2021 by Dr. Audie Box. Chart reviewed with cardiac history to include chronic dCHF, HTN, HLD, minimal carotid stenosis, venous insufficiency amongst other medical history outlined. Echo 2020 with normal EF and nuclear stress test low risk at that time.  I spoke with the patient who reports she's had some increase in swelling for the last 1 month. It's usually the R leg but starting to creep up on the L too. Venous duplex 05/2021 negative for DVT. She also has noticed some phlegm coming up for 2-3 weeks. No CP, orthponea, weight gain. She has chronic DOE and functional limitation. She reports knee surgery coming up 09/13/21.  Due to new or worsening symptoms, [name] will require a follow-up visit for further pre-operative risk assessment.  Pre-op covering staff: - Please schedule appointment and call patient to inform them. If patient already had an upcoming appointment within acceptable timeframe, please add "pre-op clearance" to the appointment notes so provider is aware. - Please contact requesting surgeon's office via preferred method (i.e, phone, fax) to inform them of need for appointment prior to surgery.  Regarding ASA clearance - Do not see any history of PCI/MI so from cardiac standpoint, so do not presently see barriers to stopping unless identified need for further testing.  Charlie Pitter, PA-C 07/29/2021, 11:13 AM

## 2021-08-04 ENCOUNTER — Encounter (HOSPITAL_COMMUNITY): Payer: Medicare Other

## 2021-08-17 ENCOUNTER — Other Ambulatory Visit: Payer: Self-pay

## 2021-08-17 ENCOUNTER — Emergency Department (HOSPITAL_BASED_OUTPATIENT_CLINIC_OR_DEPARTMENT_OTHER): Payer: Medicare Other

## 2021-08-17 ENCOUNTER — Emergency Department (HOSPITAL_BASED_OUTPATIENT_CLINIC_OR_DEPARTMENT_OTHER)
Admission: EM | Admit: 2021-08-17 | Discharge: 2021-08-17 | Disposition: A | Discharge status: Home / Self Care | Payer: Medicare Other | Attending: Emergency Medicine | Admitting: Emergency Medicine

## 2021-08-17 ENCOUNTER — Encounter (HOSPITAL_BASED_OUTPATIENT_CLINIC_OR_DEPARTMENT_OTHER): Payer: Self-pay

## 2021-08-17 DIAGNOSIS — J449 Chronic obstructive pulmonary disease, unspecified: Secondary | ICD-10-CM | POA: Diagnosis not present

## 2021-08-17 DIAGNOSIS — Z87891 Personal history of nicotine dependence: Secondary | ICD-10-CM | POA: Insufficient documentation

## 2021-08-17 DIAGNOSIS — Z79899 Other long term (current) drug therapy: Secondary | ICD-10-CM | POA: Diagnosis not present

## 2021-08-17 DIAGNOSIS — K5790 Diverticulosis of intestine, part unspecified, without perforation or abscess without bleeding: Secondary | ICD-10-CM

## 2021-08-17 DIAGNOSIS — Z7952 Long term (current) use of systemic steroids: Secondary | ICD-10-CM | POA: Insufficient documentation

## 2021-08-17 DIAGNOSIS — I509 Heart failure, unspecified: Secondary | ICD-10-CM | POA: Insufficient documentation

## 2021-08-17 DIAGNOSIS — Z7982 Long term (current) use of aspirin: Secondary | ICD-10-CM | POA: Diagnosis not present

## 2021-08-17 DIAGNOSIS — Z96652 Presence of left artificial knee joint: Secondary | ICD-10-CM | POA: Insufficient documentation

## 2021-08-17 DIAGNOSIS — R0602 Shortness of breath: Secondary | ICD-10-CM | POA: Diagnosis not present

## 2021-08-17 DIAGNOSIS — R1012 Left upper quadrant pain: Secondary | ICD-10-CM

## 2021-08-17 DIAGNOSIS — I110 Hypertensive heart disease with heart failure: Secondary | ICD-10-CM | POA: Insufficient documentation

## 2021-08-17 DIAGNOSIS — J45909 Unspecified asthma, uncomplicated: Secondary | ICD-10-CM | POA: Insufficient documentation

## 2021-08-17 DIAGNOSIS — R222 Localized swelling, mass and lump, trunk: Secondary | ICD-10-CM | POA: Insufficient documentation

## 2021-08-17 LAB — CBC WITH DIFFERENTIAL/PLATELET
Abs Immature Granulocytes: 0.04 10*3/uL (ref 0.00–0.07)
Basophils Absolute: 0 10*3/uL (ref 0.0–0.1)
Basophils Relative: 0 %
Eosinophils Absolute: 0.1 10*3/uL (ref 0.0–0.5)
Eosinophils Relative: 1 %
HCT: 35 % — ABNORMAL LOW (ref 36.0–46.0)
Hemoglobin: 11.5 g/dL — ABNORMAL LOW (ref 12.0–15.0)
Immature Granulocytes: 1 %
Lymphocytes Relative: 18 %
Lymphs Abs: 1.4 10*3/uL (ref 0.7–4.0)
MCH: 28.7 pg (ref 26.0–34.0)
MCHC: 32.9 g/dL (ref 30.0–36.0)
MCV: 87.3 fL (ref 80.0–100.0)
Monocytes Absolute: 0.6 10*3/uL (ref 0.1–1.0)
Monocytes Relative: 8 %
Neutro Abs: 5.5 10*3/uL (ref 1.7–7.7)
Neutrophils Relative %: 72 %
Platelets: 219 10*3/uL (ref 150–400)
RBC: 4.01 MIL/uL (ref 3.87–5.11)
RDW: 15.4 % (ref 11.5–15.5)
WBC: 7.6 10*3/uL (ref 4.0–10.5)
nRBC: 0 % (ref 0.0–0.2)

## 2021-08-17 LAB — COMPREHENSIVE METABOLIC PANEL
ALT: 30 U/L (ref 0–44)
AST: 27 U/L (ref 15–41)
Albumin: 3.7 g/dL (ref 3.5–5.0)
Alkaline Phosphatase: 41 U/L (ref 38–126)
Anion gap: 9 (ref 5–15)
BUN: 40 mg/dL — ABNORMAL HIGH (ref 8–23)
CO2: 25 mmol/L (ref 22–32)
Calcium: 9 mg/dL (ref 8.9–10.3)
Chloride: 99 mmol/L (ref 98–111)
Creatinine, Ser: 0.73 mg/dL (ref 0.44–1.00)
GFR, Estimated: >60 mL/min (ref >60)
Glucose, Bld: 108 mg/dL — ABNORMAL HIGH (ref 70–99)
Potassium: 4.1 mmol/L (ref 3.5–5.1)
Sodium: 133 mmol/L — ABNORMAL LOW (ref 135–145)
Total Bilirubin: 0.4 mg/dL (ref 0.3–1.2)
Total Protein: 6.2 g/dL — ABNORMAL LOW (ref 6.5–8.1)

## 2021-08-17 LAB — URINALYSIS, MICROSCOPIC (REFLEX): WBC, UA: NONE SEEN WBC/hpf (ref 0–5)

## 2021-08-17 LAB — URINALYSIS, ROUTINE W REFLEX MICROSCOPIC
Bilirubin Urine: NEGATIVE
Glucose, UA: NEGATIVE mg/dL
Ketones, ur: NEGATIVE mg/dL
Leukocytes,Ua: NEGATIVE
Nitrite: NEGATIVE
Protein, ur: NEGATIVE mg/dL
Specific Gravity, Urine: 1.015 (ref 1.005–1.030)
pH: 7 (ref 5.0–8.0)

## 2021-08-17 LAB — TROPONIN I (HIGH SENSITIVITY): Troponin I (High Sensitivity): 4 ng/L (ref <18)

## 2021-08-17 LAB — LIPASE, BLOOD: Lipase: 44 U/L (ref 11–51)

## 2021-08-17 MED ORDER — IOHEXOL 350 MG/ML SOLN
85.0000 mL | Freq: Once | INTRAVENOUS | Status: AC | PRN
  Administered 2021-08-17: 85 mL via INTRAVENOUS

## 2021-08-17 NOTE — ED Triage Notes (Addendum)
Pt c/o abd pain x 2 days-c/o "soft" mass to chest x 3 weeks-NAD-steady slow gait

## 2021-08-17 NOTE — ED Provider Notes (Signed)
[facility] Provider Note   CSN: [id number] Arrival date & time: 08/17/21  1712     History Chief Complaint  Patient presents with   Abdominal Pain    [facility] is a 75 y.o. female.  She is complaining of some left upper quadrant pain that has been going on for 3 days.  She is moving her bowels normally no vomiting.  Worse with palpation.  No trauma.  No urinary symptoms.  No fevers or chills.  She is also noticed some swelling of her upper chest.  Its been there for at least 3 weeks.  Does not seem to be changing.  Causes discomfort in her chest.  Shortness of breath is at baseline and she attributes this to her COPD and her asthma.  She also has trouble with her reflux.  The history is provided by the patient.  Abdominal Pain Pain location:  LUQ Pain quality: aching   Pain radiates to:  Does not radiate Pain severity:  Moderate Onset quality:  Gradual Duration:  3 days Timing:  Intermittent Progression:  Unchanged Chronicity:  New Context: not trauma   Relieved by:  None tried Worsened by:  Palpation Ineffective treatments:  None tried Associated symptoms: nausea and shortness of breath   Associated symptoms: no chest pain, no constipation, no cough, no diarrhea, no dysuria, no fever, no sore throat and no vomiting       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   Qualifier: Diagnosis of  By: Radene Ou MD, [place] DISEASE, 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   Annotation: 1955 Qualifier: History of  By: Radene Ou MD, [place]     Constipation 05/29/2013   COPD (chronic obstructive pulmonary disease) (HCC)    Exertional shortness of breath    Foreign body sensation in throat 12/16/2016   Gastroesophageal reflux disease with esophagitis 12/16/2016    GERD (gastroesophageal reflux disease)    Headache 03/20/2018   migraine Overview:  Overview:  migraine migraine   Headache(784.0)    migraine    Hemorrhoids    History of stomach ulcers 1990's   Hyperlipemia    HYPERLIPIDEMIA 08/25/2007   Qualifier: Diagnosis of  By: Radene Ou MD, [place]     Hypertension    Hypertension    HYPERTENSION 08/09/2007   Qualifier: Diagnosis of  By: Radene Ou MD, [place]     Idiopathic peripheral autonomic neuropathy 03/27/2019   Left lower quadrant pain 09/05/2018   Left shoulder pain 02/05/2013   Lipoma of abdominal wall 01/23/2012   Migraines    Nasal congestion    Nicotine dependence 11/23/2018   Pharyngeal dysphagia 12/16/2016   Prediabetes 01/10/2013   Rectal bleeding    Scapular dyskinesis 03/26/2018   SHINGLES, RECURRENT 09/30/2008   Qualifier: Diagnosis of  By: Radene Ou MD, [place]     Spondylolisthesis, lumbar region 04/10/2019   Added automatically from request for surgery [id number]   Sternal pain 03/26/2018   Synovial cyst of lumbar facet joint 04/08/2019   Urinary frequency    Weight loss    Wheezing     Patient Active Problem List   Diagnosis Date Noted   Diastolic dysfunction [id number]   Chronic heart failure with preserved ejection fraction ([place]) 04/02/2020   Obesity (BMI  30-39.9) 04/02/2020   Diabetes mellitus screening 02/14/2020   Bilateral leg edema 02/14/2020   Long-term use of aspirin therapy 10/03/2019   Tobacco abuse 10/03/2019   Postoperative follow-up 05/03/2019   Spondylolisthesis, lumbar region 04/10/2019   Synovial cyst of lumbar facet joint 04/08/2019   Cellulitis of left lower leg 04/08/2019   Chronic pain syndrome 03/27/2019   Left lumbar radiculopathy 03/27/2019   Osteopenia of multiple sites 01/18/2019   Vitamin D deficiency 11/23/2018   Left lower quadrant pain 09/05/2018   Scapular dyskinesis 03/26/2018   Sternal pain 03/26/2018   Headache 03/20/2018   Foreign body sensation in throat 12/16/2016   Gastroesophageal  reflux disease with esophagitis 12/16/2016   Pharyngeal dysphagia 12/16/2016   Constipation 05/29/2013   Allergy to pollen 05/29/2013   Hypertension    Headache(784.0)    GERD (gastroesophageal reflux disease)    Left shoulder pain 02/05/2013   Chronic cough 01/24/2013   Prediabetes 01/10/2013   Lipoma of abdominal wall 01/23/2012   Hemorrhoids 01/23/2012   OSTEOARTHRITIS, KNEE, LEFT 09/10/2009   SHINGLES, RECURRENT 09/30/2008   Hyperlipidemia 08/25/2007   COMMON MIGRAINE 08/09/2007   HYPERTENSION 08/09/2007   CONGENITAL HEART DISEASE, 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    Past Surgical History:  Procedure Laterality Date   APPENDECTOMY  1970's   BACK SURGERY  04/2019   COLONOSCOPY     COLONOSCOPY  12/22/2011   Procedure: COLONOSCOPY;  Surgeon: Lear Ng, MD;  Location: [facility];  Service: Endoscopy;  Laterality: N/A;   heart surgery at 28yr 1955   "blood was going thru my lungs but not thru my heart like it should" (05/09/2013)   KNEE ARTHROSCOPY Left ~ 2004; ~ 2006   LEFT OOPHORECTOMY Left ~ [place] 02/15/2012   Procedure: EXCISION LIPOMA;  Surgeon: TJoyice Faster Cornett, MD;  Location: [facility]  Service: General;  Laterality: Left;  excision lipoma Left Lower abdomen   TOTAL KNEE ARTHROPLASTY Left 05/08/2013   TOTAL KNEE ARTHROPLASTY Left 05/08/2013   Procedure: TOTAL KNEE ARTHROPLASTY;  Surgeon: DNinetta Lights MD;  [place] [facility]  Service: Orthopedics;  Laterality: Left;   [place]ATION [place]   "under breast" (05/09/2013)     OB History   No obstetric history on file.     Family History  Problem Relation Age of Onset   Colon cancer Father    Cancer Father        colon   Heart disease Father    Heart disease Paternal Uncle    Diabetes Sister    Malignant hyperthermia Neg Hx     Social History   Tobacco Use   Smoking status: Former    Packs/day: 2.00    Years: 35.00    Pack years: 70.00     Types: Cigarettes    Quit date: 08/20/2013    Years since quitting: 7.9   Smokeless tobacco: Never   Tobacco comments:    Currently exposed to 2nd hand smoke in home   Substance Use Topics   Alcohol use: No   Drug use: No    Home Medications Prior to Admission medications   Medication Sig Start Date End Date Taking? Authorizing Provider  albuterol (PROVENTIL) (2.5 MG/3ML) 0.083% nebulizer solution Take 3 mLs (2.5 mg total) by nebulization every 6 (six) hours as needed for wheezing or shortness of breath. 10/04/14   Piepenbrink, JAnderson Malta PA-C  albuterol (VENTOLIN HFA) 108 (90  Base) MCG/ACT inhaler Can inhale two puffs every four to six hours as needed for cough or wheeze. 12/03/19   Kennith Gain, MD  aspirin EC 81 MG tablet Take 81 mg by mouth daily.    [provider]  budesonide-formoterol (SYMBICORT) 80-4.5 MCG/ACT inhaler Symbicort 80 mcg-4.5 mcg/actuation HFA aerosol inhaler    [provider]  cyclobenzaprine (FLEXERIL) 10 MG tablet Take 10 mg by mouth at bedtime as needed. 10/21/19   [provider]  fluticasone Asencion Islam) 50 MCG/ACT nasal spray  12/03/19   [provider]  folic acid (FOLVITE) 1 MG tablet Take 1 mg by mouth daily. 10/01/19   [provider]  furosemide (LASIX) 20 MG tablet Take 1 tablet (20 mg total) by mouth daily as needed. 03/05/21 06/03/21  Geralynn Rile, MD  lisinopril-hydrochlorothiazide (ZESTORETIC) 20-12.5 MG tablet Take 1 tablet by mouth daily. 09/22/19   [provider]  meloxicam (MOBIC) 15 MG tablet Take 15 mg by mouth daily as needed. 07/30/18   [provider]  omeprazole (PRILOSEC) 40 MG capsule Take 40 mg by mouth daily. 11/06/19   [provider]  ondansetron (ZOFRAN-ODT) 8 MG disintegrating tablet SMARTSIG:1 Tablet(s) By Mouth Every 12 Hours PRN 01/29/21   [provider]  simvastatin (ZOCOR) 20 MG tablet Take 20 mg by mouth at bedtime. 09/02/19   [provider]  sucralfate (CARAFATE) 1 g tablet  02/15/19   [provider]  triamcinolone ointment (KENALOG) 0.1 % APPLY OINTMENT TOPICALLY TO AFFECTED AREA TWICE DAILYUSE ONLY AS NEEDED DO NOT USE REGULARLY OR DAILY 12/25/19   [provider]  VITAMIN D, CHOLECALCIFEROL, PO Take by mouth.    [provider]    Allergies    Sulfa antibiotics, Amoxicillin, Azithromycin, Clindamycin/lincomycin, Erythromycin, Penicillins, Propoxyphene n-acetaminophen, and Clarithromycin  Review of Systems   Review of Systems  Constitutional:  Negative for fever.  HENT:  Negative for sore throat.   Eyes:  Negative for visual disturbance.  Respiratory:  Positive for shortness of breath. Negative for cough.   Cardiovascular:  Negative for chest pain.  Gastrointestinal:  Positive for abdominal pain and nausea. Negative for constipation, diarrhea and vomiting.  Genitourinary:  Negative for dysuria.  Musculoskeletal:  Negative for neck pain.  Skin:  Negative for rash.  Neurological:  Negative for headaches.   Physical Exam Updated Vital Signs BP 121/62 (BP Location: Left Arm)   Pulse 94   Temp 98.5 F (36.9 C) (Oral)   Resp 20   Ht '5\' 1"'$  (1.549 m)   Wt 78 kg   SpO2 98%   BMI 32.50 kg/m   Physical Exam Vitals and nursing note reviewed.  Constitutional:      General: She is not in acute distress.    Appearance: She is well-developed.  HENT:     Head: Normocephalic and atraumatic.  Eyes:     Conjunctiva/sclera: Conjunctivae normal.  Cardiovascular:     Rate and Rhythm: Normal rate and regular rhythm.     Heart sounds: No murmur heard. Pulmonary:     Effort: Pulmonary effort is normal. No respiratory distress.     Breath sounds: Normal breath sounds.  Chest:    Abdominal:     Palpations: Abdomen is soft.     Tenderness: There is no abdominal tenderness. There is no guarding or rebound.  Musculoskeletal:        General: No deformity or signs of injury. Normal range  of motion.     Cervical  back: Neck supple.  Skin:    General: Skin is warm and dry.  Neurological:     General: No focal deficit present.     Mental Status: She is alert.     Gait: Gait normal.  This patient complains of left upper quadrant abdominal pain, central chest mass; this involves an extensive number of treatment Options and is a complaint that carries with it a high risk of complications and Morbidity. The differential includes diverticulitis, colitis, constipation, lipoma, abscess  I ordered, reviewed and interpreted labs, which included CBC with normal white count, hemoglobin slightly lower than priors, chemistries fairly unremarkable other than mildly low sodium elevated BUN, urinalysis with some microscopic hematuria no signs of infection, troponins flat I ordered imaging studies which included chest x-ray and CT chest abdomen pelvis and I independently    visualized and interpreted imaging which showed no acute findings Previous records obtained and reviewed in epic no recent admissions  After the interventions stated above, I reevaluated the patient and found patient to be hemodynamically stable and having a benign exam.  Reviewed work-up with her.  Recommended close follow-up with her treating providers.  Return instructions discussed   ED Results / Procedures / Treatments   Labs (all labs ordered are listed, but only abnormal results are displayed) Labs Reviewed  COMPREHENSIVE METABOLIC PANEL - Abnormal; Notable for the following components:      Result Value   Sodium 133 (*)    Glucose, Bld 108 (*)    BUN 40 (*)    Total Protein 6.2 (*)    All other components within normal limits  CBC WITH DIFFERENTIAL/PLATELET - Abnormal; Notable for the following components:   Hemoglobin 11.5 (*)    HCT 35.0 (*)    All other components within normal limits  URINALYSIS, ROUTINE W REFLEX MICROSCOPIC - Abnormal; Notable for the following components:   Hgb urine dipstick MODERATE  (*)    All other components within normal limits  URINALYSIS, MICROSCOPIC (REFLEX) - Abnormal; Notable for the following components:   Bacteria, UA RARE (*)    All other components within normal limits  LIPASE, BLOOD  TROPONIN I (HIGH SENSITIVITY)    EKG EKG Interpretation  Date/Time:  Tuesday August 17 2021 18:02:13 EDT Ventricular Rate:  79 PR Interval:  170 QRS Duration: 86 QT Interval:  378 QTC Calculation: 434 R Axis:   47 Text Interpretation: Sinus rhythm Low voltage, precordial leads Minimal ST elevation, inferior leads No significant change since prior 6/16 Confirmed by Aletta Edouard [phone]) on 08/17/2021 6:08:38 PM Also confirmed by Aletta Edouard [phone]), editor Hattie Perch ([id number])  on 08/18/2021 7:55:39 AM  Radiology CT CHEST ABDOMEN PELVIS W CONTRAST  Result Date: 08/17/2021 CLINICAL DATA:  Left upper quadrant abdominal pain, palpable abnormality/mass to chest/sternal area x3 weeks EXAM: CT CHEST, ABDOMEN, AND PELVIS WITH CONTRAST TECHNIQUE: Multidetector CT imaging of the chest, abdomen and pelvis was performed following the standard protocol during bolus administration of intravenous contrast. CONTRAST:  53m OMNIPAQUE IOHEXOL 350 MG/ML SOLN COMPARISON:  CTA chest dated 10/04/2014. CT abdomen/pelvis dated 03/02/2021. FINDINGS: CT CHEST FINDINGS Cardiovascular: Heart is normal in size.  No pericardial effusion. No evidence of thoracic aortic aneurysm. Very mild atherosclerotic calcifications of the aortic arch. Mild coronary atherosclerosis of the LAD. Mediastinum/Nodes: No suspicious mediastinal lymphadenopathy. Visualized thyroid is unremarkable. Lungs/Pleura: Mild biapical pleural-parenchymal scarring. No suspicious pulmonary nodules. Mild linear scarring at the left lung base. No focal consolidation. No pleural effusion or pneumothorax. Musculoskeletal: Mild degenerative  changes of the mid/lower thoracic spine. Patient's palpable abnormality along the midline  chest/sternum is marked (sagittal image 76). No underlying soft tissue mass, fluid collection, or abnormality on CT. CT ABDOMEN PELVIS FINDINGS Hepatobiliary: Liver is within normal limits. Gallbladder is unremarkable. No intrahepatic or extrahepatic ductal dilatation. Pancreas: Within normal limits. Spleen: Within normal limits. Adrenals/Urinary Tract: Adrenal glands are within normal limits. Bilateral renal cysts, measuring up to 2.3 cm in the right lower kidney. No hydronephrosis. Mildly thick-walled bladder, although underdistended. Stomach/Bowel: Stomach is within normal limits. Moderate duodenal diverticulum (series 2/image 59). No evidence of bowel obstruction. Prior appendectomy. Extensive colonic diverticulosis, without evidence of diverticulitis. Vascular/Lymphatic: No evidence of abdominal aortic aneurysm. Atherosclerotic calcifications of the abdominal aorta and branch vessels. No suspicious abdominopelvic lymphadenopathy. Reproductive: Uterus is within normal limits. Right ovary is within normal limits.  No left adnexal mass. Other: No abdominopelvic ascites. Musculoskeletal: Status post PLIF at L4-5. Very mild degenerative changes of the lumbar spine. IMPRESSION: No CT abnormality to correspond to the patient's area of palpable concern anterior to the sternum. No evidence of acute cardiopulmonary disease. No CT findings to account for the patient's left upper quadrant abdominal pain. Extensive colonic diverticulosis, without evidence of diverticulitis. Electronically Signed   By: Julian Hy M.D.   On: 08/17/2021 19:31   DG Chest Port 1 View  Result Date: 08/17/2021 CLINICAL DATA:  Abdominal pain x2 days. EXAM: PORTABLE CHEST 1 VIEW COMPARISON:  August 11, 2021 FINDINGS: The heart size and mediastinal contours are within normal limits. There is mild elevation of the left hemidiaphragm. Both lungs are clear. The visualized skeletal structures are unremarkable. IMPRESSION: No active  cardiopulmonary disease. Electronically Signed   By: Virgina Norfolk M.D.   On: 08/17/2021 18:20    Procedures Procedures   Medications Ordered in ED Medications - No data to display  ED Course  I have reviewed the triage vital signs and the nursing notes.  Pertinent labs & imaging results that were available during my care of the patient were reviewed by me and considered in my medical decision making (see chart for details).  Clinical Course as of 08/18/21 1133  Tue Aug 17, 2021  1802 Chest x-ray interpreted by me as no acute infiltrates.  Awaiting radiology reading. [MB]  [place] and lab work not showing any clear explanation for patient's symptoms.  Reviewed with her.  Recommended PCP follow-up. [MB]    Clinical Course User Index [MB] Hayden Rasmussen, MD   MDM Rules/Calculators/A&P                            Final Clinical Impression(s) / ED Diagnoses Final diagnoses:  Chest mass  Left upper quadrant abdominal pain  Diverticulosis    Rx / DC Orders ED Discharge Orders     None        Hayden Rasmussen, MD 08/18/21 1135

## 2021-08-17 NOTE — Discharge Instructions (Addendum)
You were seen in the emergency department for left-sided abdominal pain and a soft tissue mass on your chest.  You had blood work EKG chest x-ray and a CAT scan of your chest abdomen and pelvis that did not show an obvious explanation of your symptoms.  Please increase your fiber and water intake.  Follow-up with your primary care regarding your symptoms.  Return to the emergency department if any worsening or concerning symptoms

## 2021-08-25 ENCOUNTER — Ambulatory Visit (INDEPENDENT_AMBULATORY_CARE_PROVIDER_SITE_OTHER): Payer: Medicare Other | Admitting: Physician Assistant

## 2021-08-25 ENCOUNTER — Encounter: Payer: Self-pay | Admitting: Physician Assistant

## 2021-08-25 ENCOUNTER — Other Ambulatory Visit: Payer: Self-pay

## 2021-08-25 VITALS — BP 122/74 | HR 69 | Ht 61.0 in | Wt 174.2 lb

## 2021-08-25 DIAGNOSIS — E785 Hyperlipidemia, unspecified: Secondary | ICD-10-CM | POA: Diagnosis not present

## 2021-08-25 DIAGNOSIS — Z79899 Other long term (current) drug therapy: Secondary | ICD-10-CM | POA: Diagnosis not present

## 2021-08-25 DIAGNOSIS — I10 Essential (primary) hypertension: Secondary | ICD-10-CM

## 2021-08-25 DIAGNOSIS — Z01818 Encounter for other preprocedural examination: Secondary | ICD-10-CM

## 2021-08-25 LAB — BASIC METABOLIC PANEL
BUN/Creatinine Ratio: 27 (ref 12–28)
BUN: 19 mg/dL (ref 8–27)
CO2: 23 mmol/L (ref 20–29)
Calcium: 9.5 mg/dL (ref 8.7–10.3)
Chloride: 100 mmol/L (ref 96–106)
Creatinine, Ser: 0.71 mg/dL (ref 0.57–1.00)
Glucose: 83 mg/dL (ref 65–99)
Potassium: 4.6 mmol/L (ref 3.5–5.2)
Sodium: 138 mmol/L (ref 134–144)
eGFR: 90 mL/min/{1.73_m2} (ref >59)

## 2021-08-25 NOTE — Progress Notes (Addendum)
COVID swab appointment: 09-09-21  COVID Vaccine Completed:  No Date COVID Vaccine completed: Has received booster: COVID vaccine manufacturer: [place]   Date of COVID positive in last 90 days: No  PCP - [employer].  Office notes on chart Cardiologist - Eleonore Chiquito, MD  Cardiac clearance in note dated 08-25-21 by Almyra Deforest, PA  Chest x-ray - 08-17-21 Epic EKG - 08-20-21 Epic Stress Test - 11-13-19 Epic ECHO - 11-13-19 Epic Cardiac Cath - N/A Pacemaker/ICD device last checked: Spinal Cord Stimulator:  Sleep Study - N/A CPAP -   Fasting Blood Sugar - Does not check  Checks Blood Sugar _____ times a day  Blood Thinner Instructions: Aspirin Instructions:  ASA 81 mg.  Hold x5 days per clearance.  Patient aware Last Dose:  Activity level:  Can go up a flight of stairs and perform activities of daily living without  chest pain.  Patient states that she does have shortness of breath when climbing stairs due to her COPD.  States that this has not worsened and feels that she is at her baseline.      Anesthesia review: chronic heart failure, HTN, COPD  Patient denies shortness of breath, fever, cough and chest pain at PAT appointment   Patient verbalized understanding of instructions that were given to them at the PAT appointment. Patient was also instructed that they will need to review over the PAT instructions again at home before surgery.

## 2021-08-25 NOTE — Patient Instructions (Signed)
Medication Instructions:  Your physician recommends that you continue on your current medications as directed. Please refer to the Current Medication list given to you today.  *If you need a refill on your cardiac medications before your next appointment, please call your pharmacy*  Lab Work: Your physician recommends that you return for lab work TODAY:  BMET If you have labs (blood work) drawn today and your tests are completely normal, you will receive your results only by: [place] (if you have MyChart) OR A paper copy in the mail If you have any lab test that is abnormal or we need to change your treatment, we will call you to review the results.  Testing/Procedures: NONE ordered at this time of appointment   Follow-Up: At [facility], you and your health needs are our priority.  As part of our continuing mission to provide you with exceptional heart care, we have created designated Provider Care Teams.  These Care Teams include your primary Cardiologist (physician) and Advanced Practice Providers (APPs -  Physician Assistants and Nurse Practitioners) who all work together to provide you with the care you need, when you need it.  We recommend signing up for the patient portal called "MyChart".  Sign up information is provided on this After Visit Summary.  MyChart is used to connect with patients for Virtual Visits (Telemedicine).  Patients are able to view lab/test results, encounter notes, upcoming appointments, etc.  Non-urgent messages can be sent to your provider as well.   To learn more about what you can do with MyChart, go to [email or web address].    Your next appointment:   3 month(s)  The format for your next appointment:   In Person  Provider:   Eleonore Chiquito, MD  Other Instructions When sitting for long periods of time elevate your legs and wear compression socks daily

## 2021-08-25 NOTE — Progress Notes (Signed)
Cardiology Office Note:    Date:  08/27/2021   ID:  Summer Jackson, DOB 25-Aug-1946, MRN [id number]  PCP:  [facility], [place] [facility] Cardiologist:  Evalina Field, MD     Referring MD: [place]   Chief Complaint  Patient presents with   Pre-op Exam    History of Present Illness:    Summer Jackson is a 75 y.o. female with a hx of HFpEF, mild COPD, hypertension and hyperlipidemia.  Previous carotid Doppler showed minimal carotid artery disease.  Myoview obtained on 11/13/2019 showed EF 86%, overall low risk study study, no perfusion abnormality.  Echocardiogram obtained on the same day showed EF 60 to 65%, grade 1 DD, mild LAE.  He was last seen by Dr. Audie Box in March 2022 at which time he was doing well.  Venous Doppler in June 2020 was negative for DVT.  Patient presents today for preoperative clearance.  Due to her COPD and knee pain, she is unable to achieve 4 METS of activity.  She denies any recent chest pain.  EKG shows no obvious ischemic changes.  I discussed case with her primary cardiologist Dr. Audie Box, she is cleared from a cardiac perspective to proceed with knee surgery.  She may hold aspirin for about 5 days prior to the surgery and restart as soon as possible afterward at the surgeon's discretion.  One of her major concern today is bilateral lower extremity swelling that has been going on for the past month.  It initially only involved one lower extremity, now involve both.  Recent venous Doppler was negative for DVT.  On physical exam, I actually did not see significant lower extremity edema.  Talking with the patient, it appears lower extremity edema worsens by the end of the day.  I suspect her lower extremity edema is still related to venous stasis.  For the past week, patient has been taking the Lasix at daily dosing.  I will obtain a basic metabolic panel today.  If renal function is still stable, may consider increase the  Lasix to 40 mg daily as a trial.  Otherwise I think it is important to consider leg elevation and compression stocking.  She has been watching her salt intake.  She can follow-up in 3 months.   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   Qualifier: Diagnosis of  By: Radene Ou MD, [place] DISEASE, 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   Annotation: 1957 Qualifier: History of  By: Radene Ou MD, [place]     Constipation 05/29/2013   COPD (chronic obstructive pulmonary disease) (HCC)    Exertional shortness of breath    Foreign body sensation in throat 12/16/2016   Gastroesophageal reflux disease with esophagitis 12/16/2016   GERD (gastroesophageal reflux disease)    Headache 03/20/2018   migraine Overview:  Overview:  migraine migraine   Headache(784.0)    migraine    Hemorrhoids    History of stomach ulcers 1990's   Hyperlipemia    HYPERLIPIDEMIA 08/25/2007   Qualifier: Diagnosis of  By: Radene Ou MD, [place]     Hypertension    Hypertension    HYPERTENSION 08/09/2007   Qualifier: Diagnosis of  By: Radene Ou MD, [place]     Idiopathic  peripheral autonomic neuropathy 03/27/2019   Left lower quadrant pain 09/05/2018   Left shoulder pain 02/05/2013   Lipoma of abdominal wall 01/23/2012   Migraines    Nasal congestion    Nicotine dependence 11/23/2018   Pharyngeal dysphagia 12/16/2016   Prediabetes 01/10/2013   Rectal bleeding    Scapular dyskinesis 03/26/2018   SHINGLES, RECURRENT 09/30/2008   Qualifier: Diagnosis of  By: Radene Ou MD, [place]     Spondylolisthesis, lumbar region 04/10/2019   Added automatically from request for surgery [id number]   Sternal pain 03/26/2018   Synovial cyst of lumbar facet joint 04/08/2019   Urinary frequency    Weight loss    Wheezing     Past Surgical History:  Procedure  Laterality Date   APPENDECTOMY  1970's   BACK SURGERY  04/2019   COLONOSCOPY     COLONOSCOPY  12/22/2011   Procedure: COLONOSCOPY;  Surgeon: Lear Ng, MD;  Location: [facility];  Service: Endoscopy;  Laterality: N/A;   heart surgery at 42yr 1955   "blood was going thru my lungs but not thru my heart like it should" (05/09/2013)   KNEE ARTHROSCOPY Left ~ 2004; ~ 2006   LEFT OOPHORECTOMY Left ~ [place] 02/15/2012   Procedure: EXCISION LIPOMA;  Surgeon: TJoyice Faster Cornett, MD;  [facility] [facility]  Service: General;  Laterality: Left;  excision lipoma Left Lower abdomen   TOTAL KNEE ARTHROPLASTY Left 05/08/2013   TOTAL KNEE ARTHROPLASTY Left 05/08/2013   Procedure: TOTAL KNEE ARTHROPLASTY;  Surgeon: DNinetta Lights MD;  [place] [facility]  Service: Orthopedics;  Laterality: Left;   TUBAL LIGATION Left ~ 1985   TUMOR EXCISION Left ~ 1984   "under breast" (05/09/2013)    Current Medications: Current Meds  Medication Sig   albuterol (PROVENTIL) (2.5 MG/3ML) 0.083% nebulizer solution Take 3 mLs (2.5 mg total) by nebulization every 6 (six) hours as needed for wheezing or shortness of breath.   albuterol (VENTOLIN HFA) 108 (90 Base) MCG/ACT inhaler Can inhale two puffs every four to six hours as needed for cough or wheeze. (Patient taking differently: Inhale 2 puffs into the lungs every 4 (four) hours as needed for wheezing (cough).)   aspirin EC 81 MG tablet Take 81 mg by mouth daily.   fluticasone (FLONASE) 50 MCG/ACT nasal spray Place 1-2 sprays into both nostrils daily as needed for allergies.   lisinopril-hydrochlorothiazide (ZESTORETIC) 20-12.5 MG tablet Take 1 tablet by mouth daily.   omeprazole (PRILOSEC) 40 MG capsule Take 40 mg by mouth daily.   simvastatin (ZOCOR) 20 MG tablet Take 20 mg by mouth at bedtime.   sucralfate (CARAFATE) 1 g tablet Take 1 g by mouth 3 (three) times daily.   [DISCONTINUED] budesonide-formoterol (SYMBICORT) 80-4.5 MCG/ACT  inhaler Symbicort 80 mcg-4.5 mcg/actuation HFA aerosol inhaler   [DISCONTINUED] cyclobenzaprine (FLEXERIL) 10 MG tablet Take 10 mg by mouth at bedtime as needed.   [DISCONTINUED] folic acid (FOLVITE) 1 MG tablet Take 1 mg by mouth daily.   [DISCONTINUED] furosemide (LASIX) 20 MG tablet Take 1 tablet (20 mg total) by mouth daily as needed.   [DISCONTINUED] meloxicam (MOBIC) 15 MG tablet Take 15 mg by mouth daily as needed.   [DISCONTINUED] ondansetron (ZOFRAN-ODT) 8 MG disintegrating tablet SMARTSIG:1 Tablet(s) By Mouth Every 12 Hours PRN   [DISCONTINUED] triamcinolone ointment (KENALOG) 0.1 % APPLY OINTMENT TOPICALLY TO AFFECTED AREA TWICE DAILYUSE ONLY AS NEEDED DO NOT USE REGULARLY OR DAILY   [DISCONTINUED] VITAMIN D,  CHOLECALCIFEROL, PO Take by mouth.     Allergies:   Sulfa antibiotics, Amoxicillin, Azithromycin, Clindamycin/lincomycin, Erythromycin, Penicillins, Propoxyphene n-acetaminophen, and Clarithromycin   Social History   Socioeconomic History   Marital status: Divorced    Spouse name: Not on file   Number of children: 2   Years of education: Not on file   Highest education level: Not on file  Occupational History   Occupation: cleaner    Comment: cleans apts and homes  Tobacco Use   Smoking status: Former    Packs/day: 2.00    Years: 35.00    Pack years: 70.00    Types: Cigarettes    Quit date: 08/20/2013    Years since quitting: 8.0   Smokeless tobacco: Never   Tobacco comments:    Currently exposed to 2nd hand smoke in home   Vaping Use   Vaping Use: Never used  Substance and Sexual Activity   Alcohol use: No   Drug use: No   Sexual activity: Not on file  Other Topics Concern   Not on file  Social History Narrative   Lives with her daughter's father.   Social Determinants of Health   Financial Resource Strain: Not on file  Food Insecurity: Not on file  Transportation Needs: Not on file  Physical Activity: Not on file  Stress: Not on file  Social  Connections: Not on file     Family History: The patient's family history includes Cancer in her father; Colon cancer in her father; Diabetes in her sister; Heart disease in her father and paternal uncle. There is no history of Malignant hyperthermia.  ROS:   Please see the history of present illness.     All other systems reviewed and are negative.  EKGs/Labs/Other Studies Reviewed:    The following studies were reviewed today:  Echo 11/13/2019  1. Left ventricular ejection fraction, by visual estimation, is 60 to  65%. The left ventricle has normal function. There is no left ventricular  hypertrophy.   2. Elevated left atrial pressure.   3. Left ventricular diastolic parameters are consistent with Grade I  diastolic dysfunction (impaired relaxation).   4. Global right ventricle has normal systolic function.The right  ventricular size is normal. No increase in right ventricular wall  thickness.   5. Left atrial size was mildly dilated.   6. Right atrial size was normal.   7. The mitral valve is normal in structure. No evidence of mitral valve  regurgitation. No evidence of mitral stenosis.   8. The tricuspid valve is normal in structure. Tricuspid valve  regurgitation is not demonstrated.   9. The aortic valve is normal in structure. Aortic valve regurgitation is  not visualized. No evidence of aortic valve sclerosis or stenosis.  10. The pulmonic valve was normal in structure. Pulmonic valve  regurgitation is not visualized.  11. The inferior vena cava is normal in size with greater than 50%  respiratory variability, suggesting right atrial pressure of 3 mmHg.   EKG:  EKG is ordered today.  The ekg ordered today demonstrates normal sinus rhythm, no significant ST-T wave changes.  Recent Labs: 08/17/2021: ALT 30; Hemoglobin 11.5; Platelets 219 08/25/2021: BUN 19; Creatinine, Ser 0.71; Potassium 4.6; Sodium 138  Recent Lipid Panel    Component Value Date/Time   CHOL 277 (H)  04/30/2008 2259   TRIG 154 (H) 04/30/2008 2259   HDL 55 04/30/2008 2259   CHOLHDL 5.0 Ratio 04/30/2008 2259   VLDL 31 04/30/2008 2259  LDLCALC 191 (H) 04/30/2008 2259     Risk Assessment/Calculations:           Physical Exam:    VS:  BP 122/74   Pulse 69   Ht '5\' 1"'$  (1.549 m)   Wt 174 lb 3.2 oz (79 kg)   SpO2 98%   BMI 32.91 kg/m     Wt Readings from Last 3 Encounters:  08/26/21 172 lb (78 kg)  08/25/21 174 lb 3.2 oz (79 kg)  08/17/21 172 lb (78 kg)     GEN:  Well nourished, well developed in no acute distress HEENT: Normal NECK: No JVD; No carotid bruits LYMPHATICS: No lymphadenopathy CARDIAC: RRR, no murmurs, rubs, gallops RESPIRATORY:  Clear to auscultation without rales, wheezing or rhonchi  ABDOMEN: Soft, non-tender, non-distended MUSCULOSKELETAL:  No edema; No deformity  SKIN: Warm and dry NEUROLOGIC:  Alert and oriented x 3 PSYCHIATRIC:  Normal affect   ASSESSMENT:    1. Preop examination   2. Medication management   3. Primary hypertension   4. Hyperlipidemia LDL goal <100    PLAN:    In order of problems listed above:  Preoperative clearance: Patient presents today for preop clearance prior to knee surgery.  She is unable to achieve 4 METS of activity, however she has a previous Myoview in 2020 that was negative.  She does not have any prior history of CAD.  I discussed the case with her primary cardiologist Dr. Audie Box, who felt the patient is low risk for the intended procedure.  She is cleared to hold aspirin for 5 days prior to the surgery and restart as soon as possible afterward.  She is a low risk candidate for the intended surgery.  Lower extremity edema: I suspect her lower extremity edema is more related to the venous stasis rather than heart failure.  She does not have significant lower extremity edema on physical exam today.  Instead as needed dose of Lasix, she has been taking Lasix 20 mg on a daily basis for the past week.  I will obtain  basic metabolic panel today, if renal function stable, may consider trial of higher dose of diuretic, however I still think the better option would be to watch salt intake, leg elevation and compression stocking for venous stasis.  Hypertension: Blood pressure stable  Hyperlipidemia: Continue Zocor        Medication Adjustments/Labs and Tests Ordered: Current medicines are reviewed at length with the patient today.  Concerns regarding medicines are outlined above.  Orders Placed This Encounter  Procedures   Basic metabolic panel   EKG [id number]   No orders of the defined types were placed in this encounter.   Patient Instructions  Medication Instructions:  Your physician recommends that you continue on your current medications as directed. Please refer to the Current Medication list given to you today.  *If you need a refill on your cardiac medications before your next appointment, please call your pharmacy*  Lab Work: Your physician recommends that you return for lab work TODAY:  BMET If you have labs (blood work) drawn today and your tests are completely normal, you will receive your results only by: [place] (if you have MyChart) OR A paper copy in the mail If you have any lab test that is abnormal or we need to change your treatment, we will call you to review the results.  Testing/Procedures: NONE ordered at this time of appointment   Follow-Up: At [facility], you and your health  needs are our priority.  As part of our continuing mission to provide you with exceptional heart care, we have created designated Provider Care Teams.  These Care Teams include your primary Cardiologist (physician) and Advanced Practice Providers (APPs -  Physician Assistants and Nurse Practitioners) who all work together to provide you with the care you need, when you need it.  We recommend signing up for the patient portal called "MyChart".  Sign up information is provided on this  After Visit Summary.  MyChart is used to connect with patients for Virtual Visits (Telemedicine).  Patients are able to view lab/test results, encounter notes, upcoming appointments, etc.  Non-urgent messages can be sent to your provider as well.   To learn more about what you can do with MyChart, go to [email or web address].    Your next appointment:   3 month(s)  The format for your next appointment:   In Person  Provider:   Eleonore Chiquito, MD  Other Instructions When sitting for long periods of time elevate your legs and wear compression socks daily   Signed, Almyra Deforest, [place]  08/27/2021 8:39 AM    [place]

## 2021-08-25 NOTE — Patient Instructions (Addendum)
DUE TO COVID-19 ONLY ONE VISITOR IS ALLOWED TO COME WITH YOU AND STAY IN THE WAITING ROOM ONLY DURING PRE OP AND PROCEDURE.   **NO VISITORS ARE ALLOWED IN THE SHORT STAY AREA OR RECOVERY ROOM!!**  IF YOU WILL BE ADMITTED INTO THE HOSPITAL YOU ARE ALLOWED ONLY TWO SUPPORT PEOPLE DURING VISITATION HOURS ONLY (10AM -8PM)   The support person(s) may change daily. The support person(s) must pass our screening, gel in and out, and wear a mask at all times, including in the patient's room. Patients must also wear a mask when staff or their support person are in the room.  No visitors under the age of 16. Any visitor under the age of 1 must be accompanied by an adult.   COVID SWAB TESTING MUST BE COMPLETED ON:  Thursday, 09-09-21 Between the hours of 8 and 3  **MUST PRESENT COMPLETED FORM AT TESTING SITE**    [facility] (backside of the building)  You are not required to quarantine, however you are required to wear a well-fitted mask when you are out and around people not in your household.  Hand Hygiene often Do NOT share personal items Notify your provider if you are in close contact with someone who has COVID or you develop fever 100.4 or greater, new onset of sneezing, cough, sore throat, shortness of breath or body aches.         Your procedure is scheduled on:  Monday, 09-13-21   Report to [facility] Main  Entrance   Report to Short Stay at 5:15 AM   [facility])    Call this number if you have problems the morning of surgery [phone]   Do not eat food :After Midnight.   May have liquids until 4:15 AM day of surgery  CLEAR LIQUID DIET  Foods Allowed                                                                     Foods Excluded  Water, Black Coffee (no milk/no creamer) and tea, regular and decaf                              liquids that you cannot  Plain Jell-O in any flavor  (No red)                         see through such as: Fruit  ices (not with fruit pulp)                                 milk, soups, orange juice  Iced Popsicles (No red)                                    All solid food                             Apple juices Sports drinks like Gatorade (No red) Lightly seasoned clear broth or consume(fat free) Sugar  Complete one Ensure drink the morning of surgery at  4:15 AM the day of surgery.     The day of surgery:  Drink ONE (1) Pre-Surgery G2 the morning of surgery. Drink in one sitting. Do not sip.  This drink was given to you during your hospital  pre-op appointment visit. Nothing else to drink after completing the  G2.          If you have questions, please contact your surgeon's office.     Oral Hygiene is also important to reduce your risk of infection.                                    Remember - BRUSH YOUR TEETH THE MORNING OF SURGERY WITH YOUR REGULAR TOOTHPASTE   Do NOT smoke after Midnight  Take these medicines the morning of surgery with A SIP OF WATER:  Albuterol & Symbicort inhaler (bring with you day of surgery) Omeprazole, Simvastatin   Aspirin - Hold 5 days prior to surgery   Stop all vitamins and herbal supplements a week before surgery             You may not have any metal on your body including hair pins, jewelry, and body piercing             Do not wear make-up, lotions, powders, perfume or deodorant  Do not wear nail polish including gel and S&S, artificial/acrylic nails, or any other type of covering on natural nails including finger and toenails. If you have artificial nails, gel coating, etc. that needs to be removed by a nail salon please have this removed prior to surgery or surgery may need to be canceled/ delayed if the surgeon/ anesthesia feels like they are unable to be safely monitored.   Do not shave  48 hours prior to surgery.   Do not bring valuables to the hospital. [place].   Contacts, dentures or bridgework may  not be worn into surgery.   Bring small overnight bag day of surgery.   Special Instructions: Bring a copy of your healthcare power of attorney and living will documents the day of surgery if you haven't scanned them in before.  Please read over the following fact sheets you were given: IF YOU HAVE QUESTIONS ABOUT YOUR PRE OP INSTRUCTIONS PLEASE CALL [facility] - Preparing for Surgery Before surgery, you can play an important role.  Because skin is not sterile, your skin needs to be as free of germs as possible.  You can reduce the number of germs on your skin by washing with CHG (chlorahexidine gluconate) soap before surgery.  CHG is an antiseptic cleaner which kills germs and bonds with the skin to continue killing germs even after washing. Please DO NOT use if you have an allergy to CHG or antibacterial soaps.  If your skin becomes reddened/irritated stop using the CHG and inform your nurse when you arrive at Short Stay. Do not shave (including legs and underarms) for at least 48 hours prior to the first CHG shower.  You may shave your face/neck.  Please follow these instructions carefully:  1.  Shower with CHG Soap the night before surgery and the  morning of surgery.  2.  If you choose to wash your hair, wash your hair first as usual with your normal  shampoo.  3.  After  you shampoo, rinse your hair and body thoroughly to remove the shampoo.                             4.  Use CHG as you would any other liquid soap.  You can apply chg directly to the skin and wash.  Gently with a scrungie or clean washcloth.  5.  Apply the CHG Soap to your body ONLY FROM THE NECK DOWN.   Do   not use on face/ open                           Wound or open sores. Avoid contact with eyes, ears mouth and   genitals (private parts).                       Wash face,  Genitals (private parts) with your normal soap.             6.  Wash thoroughly, paying special attention to the area where your     surgery  will be performed.  7.  Thoroughly rinse your body with warm water from the neck down.  8.  DO NOT shower/wash with your normal soap after using and rinsing off the CHG Soap.                9.  Pat yourself dry with a clean towel.            10.  Wear clean pajamas.            11.  Place clean sheets on your bed the night of your first shower and do not  sleep with pets. Day of Surgery : Do not apply any lotions/deodorants the morning of surgery.  Please wear clean clothes to the hospital/surgery center.  FAILURE TO FOLLOW THESE INSTRUCTIONS MAY RESULT IN THE CANCELLATION OF YOUR SURGERY  PATIENT SIGNATURE_________________________________  NURSE SIGNATURE__________________________________  ________________________________________________________________________   Summer Jackson  An incentive spirometer is a tool that can help keep your lungs clear and active. This tool measures how well you are filling your lungs with each breath. Taking long deep breaths may help reverse or decrease the chance of developing breathing (pulmonary) problems (especially infection) following: A long period of time when you are unable to move or be active. BEFORE THE PROCEDURE  If the spirometer includes an indicator to show your best effort, your nurse or respiratory therapist will set it to a desired goal. If possible, sit up straight or lean slightly forward. Try not to slouch. Hold the incentive spirometer in an upright position. INSTRUCTIONS FOR USE  Sit on the edge of your bed if possible, or sit up as far as you can in bed or on a chair. Hold the incentive spirometer in an upright position. Breathe out normally. Place the mouthpiece in your mouth and seal your lips tightly around it. Breathe in slowly and as deeply as possible, raising the piston or the ball toward the top of the column. Hold your breath for 3-5 seconds or for as long as possible. Allow the piston or ball to fall to  the bottom of the column. Remove the mouthpiece from your mouth and breathe out normally. Rest for a few seconds and repeat Steps 1 through 7 at least 10 times every 1-2 hours when you are awake. Take your time and take a few  normal breaths between deep breaths. The spirometer may include an indicator to show your best effort. Use the indicator as a goal to work toward during each repetition. After each set of 10 deep breaths, practice coughing to be sure your lungs are clear. If you have an incision (the cut made at the time of surgery), support your incision when coughing by placing a pillow or rolled up towels firmly against it. Once you are able to get out of bed, walk around indoors and cough well. You may stop using the incentive spirometer when instructed by your caregiver.  RISKS AND COMPLICATIONS Take your time so you do not get dizzy or light-headed. If you are in pain, you may need to take or ask for pain medication before doing incentive spirometry. It is harder to take a deep breath if you are having pain. AFTER USE Rest and breathe slowly and easily. It can be helpful to keep track of a log of your progress. Your caregiver can provide you with a simple table to help with this. If you are using the spirometer at home, follow these instructions: [place] IF:  You are having difficultly using the spirometer. You have trouble using the spirometer as often as instructed. Your pain medication is not giving enough relief while using the spirometer. You develop fever of 100.5 F (38.1 C) or higher. SEEK IMMEDIATE MEDICAL CARE IF:  You cough up bloody sputum that had not been present before. You develop fever of 102 F (38.9 C) or greater. You develop worsening pain at or near the incision site. MAKE SURE YOU:  Understand these instructions. Will watch your condition. Will get help right away if you are not doing well or get worse. Document Released: 04/17/2007 Document  Revised: 02/27/2012 Document Reviewed: 06/18/2007 [facility] Patient Information 2014 [place], [place].   ________________________________________________________________________

## 2021-08-26 ENCOUNTER — Encounter (HOSPITAL_COMMUNITY)
Admission: RE | Admit: 2021-08-26 | Discharge: 2021-08-26 | Disposition: A | Discharge status: Home / Self Care | Payer: Medicare Other | Source: Ambulatory Visit | Attending: Orthopedic Surgery | Admitting: Orthopedic Surgery

## 2021-08-26 ENCOUNTER — Encounter (HOSPITAL_COMMUNITY): Payer: Self-pay

## 2021-08-26 DIAGNOSIS — Z01812 Encounter for preprocedural laboratory examination: Secondary | ICD-10-CM | POA: Insufficient documentation

## 2021-08-26 LAB — PROTIME-INR
INR: 0.9 (ref 0.8–1.2)
Prothrombin Time: 12.2 seconds (ref 11.4–15.2)

## 2021-08-26 LAB — HEMOGLOBIN A1C
Hgb A1c MFr Bld: 6 % — ABNORMAL HIGH (ref 4.8–5.6)
Mean Plasma Glucose: 125.5 mg/dL

## 2021-08-26 LAB — GLUCOSE, CAPILLARY: Glucose-Capillary: 82 mg/dL (ref 70–99)

## 2021-08-26 LAB — SURGICAL PCR SCREEN
MRSA, PCR: NEGATIVE
Staphylococcus aureus: NEGATIVE

## 2021-08-26 NOTE — Progress Notes (Signed)
Stable renal function and electrolyte

## 2021-08-27 NOTE — Telephone Encounter (Signed)
Preoperative clearance has been addressed yesterday during the cardiology office visit.  I have typed up a cardiac clearance letter for her to take across the hallway to [facility] office.

## 2021-08-30 NOTE — Progress Notes (Signed)
Anesthesia Chart Review:   Case: [id number] Date/Time: 09/13/21 0700   Procedure: TOTAL KNEE ARTHROPLASTY (Right: Knee)   Anesthesia type: Choice   Pre-op diagnosis: right knee osteoarthritis   [facility] ROOM 09 / [facility]   Surgeons: Gaynelle Arabian, MD       DISCUSSION: Pt is 75 years old with hx HTN, COPD, asthma  VS: BP 131/79   Pulse 70   Temp 36.6 C (Oral)   Resp 20   Ht '5\' 1"'$  (1.549 m)   Wt 78 kg   SpO2 100%   BMI 32.50 kg/m   PROVIDERS: - Primary care at [facility], [place] is Eleonore Chiquito, MD.  Arletha Pili for surgery by Almyra Deforest, PA at last office visit on 08/25/21  LABS: Labs reviewed: Acceptable for surgery. (all labs ordered are listed, but only abnormal results are displayed)  Labs Reviewed  HEMOGLOBIN A1C - Abnormal; Notable for the following components:      Result Value   Hgb A1c MFr Bld 6.0 (*)    All other components within normal limits  SURGICAL PCR SCREEN  PROTIME-INR  GLUCOSE, CAPILLARY     IMAGES: CT chest, abd, pelvis 08/17/21:  - No CT abnormality to correspond to the patient's area of palpable concern anterior to the sternum. - No evidence of acute cardiopulmonary disease. - No CT findings to account for the patient's left upper quadrant abdominal pain. - Extensive colonic diverticulosis, without evidence of diverticulitis.   EKG 08/25/21: NSR   CV: Carotid duplex 12/10/19:  - Right Carotid: Velocities in the right ICA are consistent with a 1-39% stenosis.  - Left Carotid: Velocities in the left ICA are consistent with a 1-39% stenosis.  - Vertebrals:  Bilateral vertebral arteries demonstrate antegrade flow.  - Subclavians: Normal flow hemodynamics were seen in bilateral subclavian arteries.   Echo 11/13/19:  1. Left ventricular ejection fraction, by visual estimation, is 60 to 65%. The left ventricle has normal function. There is no left ventricular hypertrophy.  2. Elevated left atrial pressure.  3. Left  ventricular diastolic parameters are consistent with Grade I diastolic dysfunction (impaired relaxation).  4. Global right ventricle has normal systolic function.The right ventricular size is normal. No increase in right ventricular wall thickness.  5. Left atrial size was mildly dilated.  6. Right atrial size was normal.  7. The mitral valve is normal in structure. No evidence of mitral valve regurgitation. No evidence of mitral stenosis.  8. The tricuspid valve is normal in structure. Tricuspid valve regurgitation is not demonstrated.  9. The aortic valve is normal in structure. Aortic valve regurgitation is not visualized. No evidence of aortic valve sclerosis or stenosis.   10. The pulmonic valve was normal in structure. Pulmonic valve regurgitation is not visualized.  11. The inferior vena cava is normal in size with greater than 50% respiratory variability, suggesting right atrial pressure of 3 mmHg.   Nuclear stress test 11/13/19:  Nuclear stress EF: 86%. There was no ST segment deviation noted during stress. No T wave inversion was noted during stress. The study is normal. This is a low risk study. The left ventricular ejection fraction is hyperdynamic (>65%).   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   Qualifier: Diagnosis of  ByRadene Ou MD, [place] DISEASE, 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   Annotation: 1955 Qualifier: History of  By: Radene Ou MD, [place]     Constipation 05/29/2013   COPD (chronic obstructive pulmonary disease) (HCC)    Exertional shortness of breath    Foreign body sensation in throat 12/16/2016   Gastroesophageal reflux disease with esophagitis 12/16/2016   GERD (gastroesophageal reflux disease)    Headache 03/20/2018   migraine Overview:  Overview:  migraine  migraine   Headache(784.0)    migraine    Hemorrhoids    History of stomach ulcers 1990's   Hyperlipemia    HYPERLIPIDEMIA 08/25/2007   Qualifier: Diagnosis of  By: Radene Ou MD, [place]     Hypertension    Hypertension    HYPERTENSION 08/09/2007   Qualifier: Diagnosis of  By: Radene Ou MD, [place]     Idiopathic peripheral autonomic neuropathy 03/27/2019   Left lower quadrant pain 09/05/2018   Left shoulder pain 02/05/2013   Lipoma of abdominal wall 01/23/2012   Migraines    Nasal congestion    Nicotine dependence 11/23/2018   Pharyngeal dysphagia 12/16/2016   Prediabetes 01/10/2013   Rectal bleeding    Scapular dyskinesis 03/26/2018   SHINGLES, RECURRENT 09/30/2008   Qualifier: Diagnosis of  By: Radene Ou MD, [place]     Spondylolisthesis, lumbar region 04/10/2019   Added automatically from request for surgery [id number]   Sternal pain 03/26/2018   Synovial cyst of lumbar facet joint 04/08/2019   Urinary frequency    Weight loss    Wheezing     Past Surgical History:  Procedure Laterality Date   APPENDECTOMY  1970's   BACK SURGERY  04/2019   COLONOSCOPY     COLONOSCOPY  12/22/2011   Procedure: COLONOSCOPY;  Surgeon: Lear Ng, MD;  [facility];  Service: Endoscopy;  Laterality: N/A;   heart surgery at 58yr 1955   "blood was going thru my lungs but not thru my heart like it should" (05/09/2013)   KNEE ARTHROSCOPY Left ~ 2004; ~ 2006   LEFT OOPHORECTOMY Left ~ [place] 02/15/2012   Procedure: EXCISION LIPOMA;  Surgeon: TJoyice Faster Cornett, MD;  [place]  Service: General;  Laterality: Left;  excision lipoma Left Lower abdomen   TOTAL KNEE ARTHROPLASTY Left 05/08/2013   TOTAL KNEE ARTHROPLASTY Left 05/08/2013   Procedure: TOTAL KNEE ARTHROPLASTY;  Surgeon: DNinetta Lights MD;  [place]  Service: Orthopedics;  Laterality: Left;   TUBAL LIGATION Left ~ 1985   TUMOR EXCISION Left ~ 1984   "under breast" (05/09/2013)     MEDICATIONS:  albuterol (PROVENTIL) (2.5 MG/3ML) 0.083% nebulizer solution   albuterol (VENTOLIN HFA) 108 (90 Base) MCG/ACT inhaler   aspirin EC 81 MG tablet   Cholecalciferol (CVS D3) 50 MCG (2000 UT) CAPS   fluticasone (FLONASE) 50 MCG/ACT nasal spray   folic acid (FOLVITE) 4A999333MCG tablet   lisinopril-hydrochlorothiazide (ZESTORETIC) 20-12.5 MG tablet   omeprazole (PRILOSEC) 40 MG capsule   pyridOXINE (VITAMIN B-6) 100 MG tablet   simvastatin (ZOCOR) 20 MG tablet   sucralfate (CARAFATE) 1 g tablet   vitamin B-12 (CYANOCOBALAMIN) 500 MCG tablet   zinc gluconate 50 MG tablet   No current facility-administered medications for this encounter.    If no changes, I anticipate pt can proceed with surgery as scheduled.   AWilleen Cass PhD, FNP-BC [facility] Phone: ([phone]/11/2021 4:44 PM

## 2021-08-30 NOTE — Anesthesia Preprocedure Evaluation (Addendum)
Anesthesia Evaluation  Patient identified by MRN, date of birth, ID band Patient awake    Reviewed: Allergy & Precautions, NPO status , Patient's Chart, lab work & pertinent test results  Airway Mallampati: II  TM Distance: >3 FB Neck ROM: Full    Dental  (+) Upper Dentures, Lower Dentures   Pulmonary asthma , COPD,  COPD inhaler, former smoker,    Pulmonary exam normal breath sounds clear to auscultation       Cardiovascular hypertension, Normal cardiovascular exam Rhythm:Regular Rate:Normal     Neuro/Psych negative neurological ROS  negative psych ROS   GI/Hepatic Neg liver ROS, GERD  ,  Endo/Other  negative endocrine ROS  Renal/GU negative Renal ROS  negative genitourinary   Musculoskeletal  (+) Arthritis ,   Abdominal   Peds negative pediatric ROS (+)  Hematology negative hematology ROS (+)   Anesthesia Other Findings   Reproductive/Obstetrics negative OB ROS                            Anesthesia Physical Anesthesia Plan  ASA: 3  Anesthesia Plan: General   Post-op Pain Management:  Regional for Post-op pain   Induction: Intravenous  PONV Risk Score and Plan: 2 and Dexamethasone and Treatment may vary due to age or medical condition  Airway Management Planned: LMA  Additional Equipment:   Intra-op Plan:   Post-operative Plan: Extubation in OR  Informed Consent: I have reviewed the patients History and Physical, chart, labs and discussed the procedure including the risks, benefits and alternatives for the proposed anesthesia with the patient or authorized representative who has indicated his/her understanding and acceptance.     Dental advisory given  Plan Discussed with: CRNA and Surgeon  Anesthesia Plan Comments: (See APP note by Durel Salts, FNP )       Anesthesia Quick Evaluation

## 2021-09-10 ENCOUNTER — Other Ambulatory Visit: Payer: Self-pay | Admitting: Orthopedic Surgery

## 2021-09-10 LAB — SARS CORONAVIRUS 2 (TAT 6-24 HRS): SARS Coronavirus 2: NEGATIVE

## 2021-09-12 NOTE — H&P (Signed)
TOTAL KNEE ADMISSION H&P  Patient is being admitted for right total knee arthroplasty.  Subjective:  Chief Complaint: Right knee pain.  HPI: Summer Jackson, 75 y.o. female has a history of pain and functional disability in the right knee due to arthritis and has failed non-surgical conservative treatments for greater than 12 weeks to include NSAID's and/or analgesics, flexibility and strengthening excercises, and activity modification. Onset of symptoms was gradual, starting  several  years ago with gradually worsening course since that time. The patient noted no past surgery on the right knee.  Patient currently rates pain in the right knee at 7 out of 10 with activity. Patient has worsening of pain with activity and weight bearing, pain that interferes with activities of daily living, pain with passive range of motion, and crepitus. Patient has evidence of periarticular osteophytes and joint space narrowing by imaging studies. There is no active infection.  Patient Active Problem List   Diagnosis Date Noted   Diastolic dysfunction 27/05/2375   Chronic heart failure with preserved ejection fraction ([place]) 04/02/2020   Obesity (BMI 30-39.9) 04/02/2020   Diabetes mellitus screening 02/14/2020   Bilateral leg edema 02/14/2020   Long-term use of aspirin therapy 10/03/2019   Tobacco abuse 10/03/2019   Postoperative follow-up 05/03/2019   Spondylolisthesis, lumbar region 04/10/2019   Synovial cyst of lumbar facet joint 04/08/2019   Cellulitis of left lower leg 04/08/2019   Chronic pain syndrome 03/27/2019   Left lumbar radiculopathy 03/27/2019   Osteopenia of multiple sites 01/18/2019   Vitamin D deficiency 11/23/2018   Left lower quadrant pain 09/05/2018   Scapular dyskinesis 03/26/2018   Sternal pain 03/26/2018   Headache 03/20/2018   Foreign body sensation in throat 12/16/2016   Gastroesophageal reflux disease with esophagitis 12/16/2016   Pharyngeal dysphagia 12/16/2016    Constipation 05/29/2013   Allergy to pollen 05/29/2013   Hypertension    Headache(784.0)    GERD (gastroesophageal reflux disease)    Left shoulder pain 02/05/2013   Chronic cough 01/24/2013   Prediabetes 01/10/2013   Lipoma of abdominal wall 01/23/2012   Hemorrhoids 01/23/2012   OSTEOARTHRITIS, KNEE, LEFT 09/10/2009   SHINGLES, RECURRENT 09/30/2008   Hyperlipidemia 08/25/2007   COMMON MIGRAINE 08/09/2007   HYPERTENSION 08/09/2007   CONGENITAL HEART DISEASE, 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   Qualifier: Diagnosis of  By: Radene Ou MD, [place] DISEASE, 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   Annotation: 1955 Qualifier: History of  By: Radene Ou MD, [place]     Constipation 05/29/2013   COPD (chronic obstructive pulmonary disease) (HCC)    Exertional shortness of breath    Foreign body sensation in throat 12/16/2016   Gastroesophageal reflux disease with esophagitis 12/16/2016   GERD (gastroesophageal reflux disease)    Headache 03/20/2018   migraine Overview:  Overview:  migraine migraine   Headache(784.0)    migraine    Hemorrhoids    History of stomach ulcers 1990's   Hyperlipemia    HYPERLIPIDEMIA 08/25/2007   Qualifier: Diagnosis of  By: Radene Ou MD, [place]     Hypertension    Hypertension    HYPERTENSION 08/09/2007   Qualifier: Diagnosis of  By: Radene Ou MD, [place]     Idiopathic peripheral autonomic neuropathy  03/27/2019   Left lower quadrant pain 09/05/2018   Left shoulder pain 02/05/2013   Lipoma of abdominal wall 01/23/2012   Migraines    Nasal congestion    Nicotine dependence 11/23/2018   Pharyngeal dysphagia 12/16/2016   Prediabetes 01/10/2013   Rectal bleeding    Scapular dyskinesis 03/26/2018   SHINGLES, RECURRENT 09/30/2008   Qualifier: Diagnosis of   By: Radene Ou MD, [place]     Spondylolisthesis, lumbar region 04/10/2019   Added automatically from request for surgery [id number]   Sternal pain 03/26/2018   Synovial cyst of lumbar facet joint 04/08/2019   Urinary frequency    Weight loss    Wheezing     Past Surgical History:  Procedure Laterality Date   APPENDECTOMY  1970's   BACK SURGERY  04/2019   COLONOSCOPY     COLONOSCOPY  12/22/2011   Procedure: COLONOSCOPY;  Surgeon: Lear Ng, MD;  [facility];  Service: Endoscopy;  Laterality: N/A;   heart surgery at 15yr  1955   "blood was going thru my lungs but not thru my heart like it should" (05/09/2013)   KNEE ARTHROSCOPY Left ~ 2004; ~ 2006   LEFT OOPHORECTOMY Left ~ [place]  02/15/2012   Procedure: EXCISION LIPOMA;  Surgeon: Joyice Faster. Cornett, MD;  Location: [place];  Service: General;  Laterality: Left;  excision lipoma Left Lower abdomen   TOTAL KNEE ARTHROPLASTY Left 05/08/2013   TOTAL KNEE ARTHROPLASTY Left 05/08/2013   Procedure: TOTAL KNEE ARTHROPLASTY;  Surgeon: Ninetta Lights, MD;  Location: [place];  Service: Orthopedics;  Laterality: Left;   TUBAL LIGATION Left ~ [place] Left ~ 1984   "under breast" (05/09/2013)    Prior to Admission medications   Medication Sig Start Date End Date Taking? Authorizing Provider  albuterol (PROVENTIL) (2.5 MG/3ML) 0.083% nebulizer solution Take 3 mLs (2.5 mg total) by nebulization every 6 (six) hours as needed for wheezing or shortness of breath. 10/04/14  Yes Piepenbrink, Anderson [place], PA-C  albuterol (VENTOLIN HFA) 108 (90 Base) MCG/ACT inhaler Can inhale two puffs every four to six hours as needed for cough or wheeze. Patient taking differently: Inhale 2 puffs into the lungs every 4 (four) hours as needed for wheezing (cough). 12/03/19  Yes Kennith Gain, MD  aspirin EC 81 MG tablet Take 81 mg by mouth daily.   Yes [provider]  Cholecalciferol (CVS D3) 50 MCG  (2000 UT) CAPS Take 2,000 Units by mouth daily.   Yes [provider]  fluticasone (FLONASE) 50 MCG/ACT nasal spray Place 1-2 sprays into both nostrils daily as needed for allergies. 12/03/19  Yes [provider]  folic acid (FOLVITE) 563 MCG tablet Take 400 mcg by mouth daily.   Yes [provider]  lisinopril-hydrochlorothiazide (ZESTORETIC) 20-12.5 MG tablet Take 1 tablet by mouth daily. 09/22/19  Yes [provider]  omeprazole (PRILOSEC) 40 MG capsule Take 40 mg by mouth daily. 11/06/19  Yes [provider]  pyridOXINE (VITAMIN B-6) 100 MG tablet Take 100 mg by mouth daily.   Yes [provider]  simvastatin (ZOCOR) 20 MG tablet Take 20 mg by mouth at bedtime. 09/02/19  Yes [provider]  sucralfate (CARAFATE) 1 g tablet Take 1 g by mouth 3 (three) times daily. 02/15/19  Yes [provider]  vitamin B-12 (CYANOCOBALAMIN) 500 MCG tablet Take 500 mcg by mouth daily.   Yes [provider]  zinc gluconate 50 MG  tablet Take 50 mg by mouth daily.   Yes [provider]    Allergies  Allergen Reactions   Sulfa Antibiotics Swelling and Rash   Amoxicillin Other (See Comments)    REACTION: headache   Azithromycin Other (See Comments)    REACTION: reddness around mouth   Clindamycin/Lincomycin Hives   Erythromycin Hives   Penicillins Other (See Comments)    REACTION: headache Has patient had a PCN reaction causing immediate rash, facial/tongue/throat swelling, SOB or lightheadedness with hypotension: No Has patient had a PCN reaction causing severe rash involving mucus membranes or skin necrosis: No Has patient had a PCN reaction that required hospitalization No Has patient had a PCN reaction occurring within the last 10 years: No If all of the above answers are "NO", then may proceed with Cephalosporin use.    Propoxyphene N-Acetaminophen Other (See Comments)    REACTION: mouth sores   Clarithromycin  Rash    REACTION: skin rash with minimal vesicular formation on arms and hands    Social History   Socioeconomic History   Marital status: Divorced    Spouse name: Not on file   Number of children: 2   Years of education: Not on file   Highest education level: Not on file  Occupational History   Occupation: cleaner    Comment: cleans apts and homes  Tobacco Use   Smoking status: Former    Packs/day: 2.00    Years: 35.00    Pack years: 70.00    Types: Cigarettes    Quit date: 08/20/2013    Years since quitting: 8.0   Smokeless tobacco: Never   Tobacco comments:    Currently exposed to 2nd hand smoke in home   Vaping Use   Vaping Use: Never used  Substance and Sexual Activity   Alcohol use: No   Drug use: No   Sexual activity: Not on file  Other Topics Concern   Not on file  Social History Narrative   Lives with her daughter's father.   Social Determinants of Health   Financial Resource Strain: Not on file  Food Insecurity: Not on file  Transportation Needs: Not on file  Physical Activity: Not on file  Stress: Not on file  Social Connections: Not on file  Intimate Partner Violence: Not on file    Tobacco Use: Medium Risk   Smoking Tobacco Use: Former   Smokeless Tobacco Use: Never   Social History   Substance and Sexual Activity  Alcohol Use No    Family History  Problem Relation Age of Onset   Colon cancer Father    Cancer Father        colon   Heart disease Father    Heart disease Paternal Uncle    Diabetes Sister    Malignant hyperthermia Neg Hx     ROS: Constitutional: no fever, no chills, no night sweats, no significant weight loss Cardiovascular: no chest pain, no palpitations Respiratory: no cough, no shortness of breath, No COPD Gastrointestinal: no vomiting, no nausea Musculoskeletal: no swelling in Joints, Joint Pain Neurologic: no numbness, no tingling, no difficulty with balance   Objective:  Physical Exam: Well nourished and  well developed.  General: Alert and oriented x3, cooperative and pleasant, no acute distress.  Head: normocephalic, atraumatic, neck supple.  Eyes: EOMI.  Respiratory: breath sounds clear in all fields, no wheezing, rales, or rhonchi. Cardiovascular: Regular rate and rhythm, no murmurs, gallops or rubs.  Abdomen: non-tender to palpation and soft, normoactive bowel  sounds. Musculoskeletal:  The patient has a significantly antalgic gait pattern on the right.     Bilateral Hip Exam:   The range of motion: normal without discomfort.     Right Knee Exam:   Valgus deformity.   No effusion present. No swelling present.   The range of motion is: 0 to 120 degrees.   No crepitus on range of motion of the knee.   Positive lateral greater than medial joint line tenderness.   The knee is stable.     Bilateral Feet Exam:   Mild swelling in bilateral feet, but no significant pitting edema in her lower legs.     The patient's sensation and motor function are intact in their lower extremities. Their distal pulses are 2+. The bilateral calves are soft and non-tender.    Calves soft and nontender. Motor function intact in LE. Strength 5/5 LE bilaterally. Neuro: Distal pulses 2+. Sensation to light touch intact in LE.   Vital signs in last 24 hours:    Imaging Review Radiographs- bilateral AP and lateral of the right knee dated 10/2020 demonstrate bone-on-bone arthritis in the lateral compartment with valgus deformity. She has some patellofemoral narrowing, but not bone-on-bone.  Assessment/Plan:  End stage arthritis, right knee   The patient history, physical examination, clinical judgment of the provider and imaging studies are consistent with end stage degenerative joint disease of the right knee and total knee arthroplasty is deemed medically necessary. The treatment options including medical management, injection therapy arthroscopy and arthroplasty were discussed at length. The risks and  benefits of total knee arthroplasty were presented and reviewed. The risks due to aseptic loosening, infection, stiffness, patella tracking problems, thromboembolic complications and other imponderables were discussed. The patient acknowledged the explanation, agreed to proceed with the plan and consent was signed. Patient is being admitted for inpatient treatment for surgery, pain control, PT, OT, prophylactic antibiotics, VTE prophylaxis, progressive ambulation and ADLs and discharge planning. The patient is planning to be discharged  home .   Patient's anticipated LOS is less than 2 midnights, meeting these requirements: - Younger than 31 - Lives within 1 hour of care - Has a competent adult at home to recover with post-op recover - NO history of  - Chronic pain requiring opiods  - Diabetes  - Coronary Artery Disease  - Heart failure  - Heart attack  - Stroke  - DVT/VTE  - Cardiac arrhythmia  - Respiratory Failure/COPD  - Renal failure  - Anemia  - Advanced Liver disease    Therapy Plans: [facility] Disposition: Home with Daughter Planned DVT Prophylaxis: Aspirin 325mg  DME Needed: Gilford Rile PCP: [facility] Cardiology: Almyra Deforest, MD (clearance received) TXA: IV Allergies: Sulfa (mouth swollen), PCN, Amoxicillin, Clindamycin, Erythromycin Anesthesia Concerns: None BMI: 32.2 Last HgbA1c: N/A  Pharmacy: [facility]  on [address] in [place]  Other:   - Patient was instructed on what medications to stop prior to surgery. - Follow-up visit in 2 weeks with Dr. Wynelle Link - Begin physical therapy following surgery - Pre-operative lab work as pre-surgical testing - Prescriptions will be provided in hospital at time of discharge  Fenton Foy, [facility], PA-C Orthopedic Surgery [facility] [place]

## 2021-09-13 ENCOUNTER — Ambulatory Visit (HOSPITAL_COMMUNITY): Payer: Medicare Other | Admitting: Certified Registered Nurse Anesthetist

## 2021-09-13 ENCOUNTER — Encounter (HOSPITAL_COMMUNITY): Payer: Self-pay | Admitting: Orthopedic Surgery

## 2021-09-13 ENCOUNTER — Other Ambulatory Visit: Payer: Self-pay

## 2021-09-13 ENCOUNTER — Observation Stay (HOSPITAL_COMMUNITY)
Admission: RE | Admit: 2021-09-13 | Discharge: 2021-09-15 | Disposition: A | Discharge status: Home / Self Care | Payer: Medicare Other | Attending: Orthopedic Surgery | Admitting: Orthopedic Surgery

## 2021-09-13 ENCOUNTER — Ambulatory Visit (HOSPITAL_COMMUNITY): Payer: Medicare Other | Admitting: Emergency Medicine

## 2021-09-13 ENCOUNTER — Ambulatory Visit: Payer: Medicare Other | Admitting: Cardiovascular Disease

## 2021-09-13 ENCOUNTER — Encounter (HOSPITAL_COMMUNITY)
Admission: RE | Disposition: A | Discharge status: Home / Self Care | Payer: Self-pay | Source: Home / Self Care | Attending: Orthopedic Surgery

## 2021-09-13 DIAGNOSIS — Z87891 Personal history of nicotine dependence: Secondary | ICD-10-CM | POA: Insufficient documentation

## 2021-09-13 DIAGNOSIS — I5030 Unspecified diastolic (congestive) heart failure: Secondary | ICD-10-CM | POA: Diagnosis not present

## 2021-09-13 DIAGNOSIS — M1710 Unilateral primary osteoarthritis, unspecified knee: Secondary | ICD-10-CM | POA: Diagnosis present

## 2021-09-13 DIAGNOSIS — M179 Osteoarthritis of knee, unspecified: Secondary | ICD-10-CM

## 2021-09-13 DIAGNOSIS — Z7982 Long term (current) use of aspirin: Secondary | ICD-10-CM | POA: Insufficient documentation

## 2021-09-13 DIAGNOSIS — R7303 Prediabetes: Secondary | ICD-10-CM | POA: Diagnosis not present

## 2021-09-13 DIAGNOSIS — Z96652 Presence of left artificial knee joint: Secondary | ICD-10-CM | POA: Insufficient documentation

## 2021-09-13 DIAGNOSIS — M1711 Unilateral primary osteoarthritis, right knee: Principal | ICD-10-CM

## 2021-09-13 DIAGNOSIS — Z79899 Other long term (current) drug therapy: Secondary | ICD-10-CM | POA: Diagnosis not present

## 2021-09-13 DIAGNOSIS — I110 Hypertensive heart disease with heart failure: Secondary | ICD-10-CM | POA: Insufficient documentation

## 2021-09-13 DIAGNOSIS — J449 Chronic obstructive pulmonary disease, unspecified: Secondary | ICD-10-CM | POA: Diagnosis not present

## 2021-09-13 HISTORY — PX: TOTAL KNEE ARTHROPLASTY: SHX125

## 2021-09-13 LAB — COMPREHENSIVE METABOLIC PANEL
ALT: 24 U/L (ref 0–44)
AST: 26 U/L (ref 15–41)
Albumin: 3.9 g/dL (ref 3.5–5.0)
Alkaline Phosphatase: 42 U/L (ref 38–126)
Anion gap: 7 (ref 5–15)
BUN: 21 mg/dL (ref 8–23)
CO2: 25 mmol/L (ref 22–32)
Calcium: 8.9 mg/dL (ref 8.9–10.3)
Chloride: 101 mmol/L (ref 98–111)
Creatinine, Ser: 0.73 mg/dL (ref 0.44–1.00)
GFR, Estimated: >60 mL/min (ref >60)
Glucose, Bld: 137 mg/dL — ABNORMAL HIGH (ref 70–99)
Potassium: 3.9 mmol/L (ref 3.5–5.1)
Sodium: 133 mmol/L — ABNORMAL LOW (ref 135–145)
Total Bilirubin: 0.5 mg/dL (ref 0.3–1.2)
Total Protein: 6.3 g/dL — ABNORMAL LOW (ref 6.5–8.1)

## 2021-09-13 LAB — CBC
HCT: 33.7 % — ABNORMAL LOW (ref 36.0–46.0)
Hemoglobin: 11.2 g/dL — ABNORMAL LOW (ref 12.0–15.0)
MCH: 29.1 pg (ref 26.0–34.0)
MCHC: 33.2 g/dL (ref 30.0–36.0)
MCV: 87.5 fL (ref 80.0–100.0)
Platelets: 215 10*3/uL (ref 150–400)
RBC: 3.85 MIL/uL — ABNORMAL LOW (ref 3.87–5.11)
RDW: 14.5 % (ref 11.5–15.5)
WBC: 8.4 10*3/uL (ref 4.0–10.5)
nRBC: 0 % (ref 0.0–0.2)

## 2021-09-13 LAB — GLUCOSE, CAPILLARY: Glucose-Capillary: 96 mg/dL (ref 70–99)

## 2021-09-13 SURGERY — ARTHROPLASTY, KNEE, TOTAL
Anesthesia: General | Site: Knee | Laterality: Right

## 2021-09-13 MED ORDER — HYDROMORPHONE HCL 2 MG/ML IJ SOLN
INTRAMUSCULAR | Status: AC
  Filled 2021-09-13: qty 1

## 2021-09-13 MED ORDER — TRANEXAMIC ACID-NACL 1000-0.7 MG/100ML-% IV SOLN
1000.0000 mg | INTRAVENOUS | Status: AC
  Administered 2021-09-13: 1000 mg via INTRAVENOUS
  Filled 2021-09-13: qty 100

## 2021-09-13 MED ORDER — MORPHINE SULFATE (PF) 4 MG/ML IV SOLN: 0.5000 mg | INTRAVENOUS | Status: DC | PRN

## 2021-09-13 MED ORDER — MENTHOL 3 MG MT LOZG: 1.0000 | LOZENGE | OROMUCOSAL | Status: DC | PRN

## 2021-09-13 MED ORDER — DIPHENHYDRAMINE HCL 12.5 MG/5ML PO ELIX: 12.5000 mg | ORAL_SOLUTION | ORAL | Status: DC | PRN

## 2021-09-13 MED ORDER — PANTOPRAZOLE SODIUM 40 MG PO TBEC
40.0000 mg | DELAYED_RELEASE_TABLET | Freq: Every day | ORAL | Status: DC
  Administered 2021-09-13 – 2021-09-15 (×2): 40 mg via ORAL
  Filled 2021-09-13 (×3): qty 1

## 2021-09-13 MED ORDER — ONDANSETRON HCL 4 MG/2ML IJ SOLN: 4.0000 mg | Freq: Once | INTRAMUSCULAR | Status: DC | PRN

## 2021-09-13 MED ORDER — LACTATED RINGERS IV SOLN: INTRAVENOUS | Status: DC

## 2021-09-13 MED ORDER — ALBUTEROL SULFATE (2.5 MG/3ML) 0.083% IN NEBU
2.5000 mg | INHALATION_SOLUTION | Freq: Four times a day (QID) | RESPIRATORY_TRACT | Status: DC | PRN
  Administered 2021-09-15: 2.5 mg via RESPIRATORY_TRACT
  Filled 2021-09-13: qty 3

## 2021-09-13 MED ORDER — DEXAMETHASONE SODIUM PHOSPHATE 10 MG/ML IJ SOLN
INTRAMUSCULAR | Status: AC
  Filled 2021-09-13: qty 1

## 2021-09-13 MED ORDER — POVIDONE-IODINE 10 % EX SWAB
2.0000 "application " | Freq: Once | CUTANEOUS | Status: AC
  Administered 2021-09-13: 2 via TOPICAL

## 2021-09-13 MED ORDER — CEFAZOLIN SODIUM-DEXTROSE 2-4 GM/100ML-% IV SOLN
2.0000 g | Freq: Four times a day (QID) | INTRAVENOUS | Status: AC
  Administered 2021-09-13 (×2): 2 g via INTRAVENOUS
  Filled 2021-09-13 (×2): qty 100

## 2021-09-13 MED ORDER — ALBUTEROL SULFATE HFA 108 (90 BASE) MCG/ACT IN AERS: 2.0000 | INHALATION_SPRAY | RESPIRATORY_TRACT | Status: DC | PRN

## 2021-09-13 MED ORDER — LIDOCAINE HCL (CARDIAC) PF 100 MG/5ML IV SOSY
PREFILLED_SYRINGE | INTRAVENOUS | Status: DC | PRN
  Administered 2021-09-13: 100 mg via INTRATRACHEAL

## 2021-09-13 MED ORDER — OXYCODONE HCL 5 MG PO TABS
5.0000 mg | ORAL_TABLET | ORAL | Status: DC | PRN
  Administered 2021-09-13 – 2021-09-15 (×10): 10 mg via ORAL
  Filled 2021-09-13 (×10): qty 2

## 2021-09-13 MED ORDER — LIDOCAINE HCL (PF) 2 % IJ SOLN
INTRAMUSCULAR | Status: AC
  Filled 2021-09-13: qty 5

## 2021-09-13 MED ORDER — ONDANSETRON HCL 4 MG/2ML IJ SOLN
INTRAMUSCULAR | Status: DC | PRN
  Administered 2021-09-13: 4 mg via INTRAVENOUS

## 2021-09-13 MED ORDER — METHOCARBAMOL 500 MG IVPB - SIMPLE MED
INTRAVENOUS | Status: AC
  Filled 2021-09-13: qty 50

## 2021-09-13 MED ORDER — PROPOFOL 10 MG/ML IV BOLUS
INTRAVENOUS | Status: DC | PRN
  Administered 2021-09-13: 100 mg via INTRAVENOUS

## 2021-09-13 MED ORDER — ACETAMINOPHEN 10 MG/ML IV SOLN
INTRAVENOUS | Status: DC | PRN
  Administered 2021-09-13: 1000 mg via INTRAVENOUS

## 2021-09-13 MED ORDER — SODIUM CHLORIDE 0.9 % IV SOLN: INTRAVENOUS | Status: DC

## 2021-09-13 MED ORDER — SIMVASTATIN 20 MG PO TABS
20.0000 mg | ORAL_TABLET | Freq: Every day | ORAL | Status: DC
  Administered 2021-09-14: 20 mg via ORAL
  Filled 2021-09-13: qty 1

## 2021-09-13 MED ORDER — ASPIRIN EC 325 MG PO TBEC
325.0000 mg | DELAYED_RELEASE_TABLET | Freq: Two times a day (BID) | ORAL | Status: DC
  Administered 2021-09-14 – 2021-09-15 (×3): 325 mg via ORAL
  Filled 2021-09-13 (×3): qty 1

## 2021-09-13 MED ORDER — HYDROMORPHONE HCL 1 MG/ML IJ SOLN
INTRAMUSCULAR | Status: DC | PRN
  Administered 2021-09-13 (×2): .5 mg via INTRAVENOUS

## 2021-09-13 MED ORDER — PHENYLEPHRINE 40 MCG/ML (10ML) SYRINGE FOR IV PUSH (FOR BLOOD PRESSURE SUPPORT)
PREFILLED_SYRINGE | INTRAVENOUS | Status: AC
  Filled 2021-09-13: qty 10

## 2021-09-13 MED ORDER — METHOCARBAMOL 500 MG IVPB - SIMPLE MED
500.0000 mg | Freq: Four times a day (QID) | INTRAVENOUS | Status: DC | PRN
  Administered 2021-09-13: 500 mg via INTRAVENOUS
  Filled 2021-09-13: qty 50

## 2021-09-13 MED ORDER — METOCLOPRAMIDE HCL 5 MG PO TABS: 5.0000 mg | ORAL_TABLET | Freq: Three times a day (TID) | ORAL | Status: DC | PRN

## 2021-09-13 MED ORDER — CHLORHEXIDINE GLUCONATE 0.12 % MT SOLN
15.0000 mL | Freq: Once | OROMUCOSAL | Status: AC
  Administered 2021-09-13: 15 mL via OROMUCOSAL

## 2021-09-13 MED ORDER — BUPIVACAINE LIPOSOME 1.3 % IJ SUSP
INTRAMUSCULAR | Status: DC | PRN
  Administered 2021-09-13: 20 mL

## 2021-09-13 MED ORDER — HYDROMORPHONE HCL 1 MG/ML IJ SOLN
INTRAMUSCULAR | Status: AC
  Filled 2021-09-13: qty 1

## 2021-09-13 MED ORDER — ALPRAZOLAM 1 MG PO TABS
1.0000 mg | ORAL_TABLET | Freq: Once | ORAL | Status: DC | PRN
  Filled 2021-09-13: qty 1

## 2021-09-13 MED ORDER — GABAPENTIN 300 MG PO CAPS
300.0000 mg | ORAL_CAPSULE | Freq: Three times a day (TID) | ORAL | Status: DC
  Administered 2021-09-13 – 2021-09-15 (×7): 300 mg via ORAL
  Filled 2021-09-13 (×7): qty 1

## 2021-09-13 MED ORDER — SODIUM CHLORIDE 0.9 % IR SOLN
Status: DC | PRN
  Administered 2021-09-13: 1000 mL

## 2021-09-13 MED ORDER — ORAL CARE MOUTH RINSE: 15.0000 mL | Freq: Once | OROMUCOSAL | Status: AC

## 2021-09-13 MED ORDER — METHOCARBAMOL 500 MG PO TABS
500.0000 mg | ORAL_TABLET | Freq: Four times a day (QID) | ORAL | Status: DC | PRN
  Administered 2021-09-13 – 2021-09-15 (×5): 500 mg via ORAL
  Filled 2021-09-13 (×5): qty 1

## 2021-09-13 MED ORDER — OXYCODONE HCL 5 MG/5ML PO SOLN: 5.0000 mg | Freq: Once | ORAL | Status: DC | PRN

## 2021-09-13 MED ORDER — ONDANSETRON HCL 4 MG/2ML IJ SOLN
4.0000 mg | Freq: Four times a day (QID) | INTRAMUSCULAR | Status: DC | PRN
  Administered 2021-09-13: 4 mg via INTRAVENOUS
  Filled 2021-09-13: qty 2

## 2021-09-13 MED ORDER — BUPIVACAINE LIPOSOME 1.3 % IJ SUSP
INTRAMUSCULAR | Status: AC
  Filled 2021-09-13: qty 20

## 2021-09-13 MED ORDER — SODIUM CHLORIDE (PF) 0.9 % IJ SOLN
INTRAMUSCULAR | Status: AC
  Filled 2021-09-13: qty 10

## 2021-09-13 MED ORDER — POLYETHYLENE GLYCOL 3350 17 G PO PACK: 17.0000 g | PACK | Freq: Every day | ORAL | Status: DC | PRN

## 2021-09-13 MED ORDER — ONDANSETRON HCL 4 MG/2ML IJ SOLN
INTRAMUSCULAR | Status: AC
  Filled 2021-09-13: qty 2

## 2021-09-13 MED ORDER — FENTANYL CITRATE (PF) 100 MCG/2ML IJ SOLN
INTRAMUSCULAR | Status: DC | PRN
  Administered 2021-09-13 (×2): 50 ug via INTRAVENOUS

## 2021-09-13 MED ORDER — BUPIVACAINE HCL (PF) 0.5 % IJ SOLN
INTRAMUSCULAR | Status: DC | PRN
  Administered 2021-09-13: 20 mL via PERINEURAL

## 2021-09-13 MED ORDER — 0.9 % SODIUM CHLORIDE (POUR BTL) OPTIME
TOPICAL | Status: DC | PRN
  Administered 2021-09-13: 1000 mL

## 2021-09-13 MED ORDER — BUPIVACAINE LIPOSOME 1.3 % IJ SUSP: 20.0000 mL | Freq: Once | INTRAMUSCULAR | Status: DC

## 2021-09-13 MED ORDER — DOCUSATE SODIUM 100 MG PO CAPS
100.0000 mg | ORAL_CAPSULE | Freq: Two times a day (BID) | ORAL | Status: DC
  Administered 2021-09-14 (×3): 100 mg via ORAL
  Filled 2021-09-13 (×3): qty 1

## 2021-09-13 MED ORDER — KETAMINE HCL 10 MG/ML IJ SOLN
INTRAMUSCULAR | Status: AC
  Filled 2021-09-13: qty 1

## 2021-09-13 MED ORDER — PHENOL 1.4 % MT LIQD: 1.0000 | OROMUCOSAL | Status: DC | PRN

## 2021-09-13 MED ORDER — DEXAMETHASONE SODIUM PHOSPHATE 10 MG/ML IJ SOLN
10.0000 mg | Freq: Once | INTRAMUSCULAR | Status: AC
  Administered 2021-09-14: 10 mg via INTRAVENOUS
  Filled 2021-09-13: qty 1

## 2021-09-13 MED ORDER — MIDAZOLAM HCL 2 MG/2ML IJ SOLN
INTRAMUSCULAR | Status: AC
  Filled 2021-09-13: qty 2

## 2021-09-13 MED ORDER — OXYCODONE HCL 5 MG PO TABS: 5.0000 mg | ORAL_TABLET | Freq: Once | ORAL | Status: DC | PRN

## 2021-09-13 MED ORDER — SUCRALFATE 1 G PO TABS
1.0000 g | ORAL_TABLET | Freq: Three times a day (TID) | ORAL | Status: DC
  Administered 2021-09-13 – 2021-09-15 (×7): 1 g via ORAL
  Filled 2021-09-13 (×7): qty 1

## 2021-09-13 MED ORDER — SODIUM CHLORIDE (PF) 0.9 % IJ SOLN
INTRAMUSCULAR | Status: DC | PRN
  Administered 2021-09-13: 60 mL

## 2021-09-13 MED ORDER — CEFAZOLIN SODIUM-DEXTROSE 2-4 GM/100ML-% IV SOLN
2.0000 g | INTRAVENOUS | Status: AC
  Administered 2021-09-13: 2 g via INTRAVENOUS
  Filled 2021-09-13: qty 100

## 2021-09-13 MED ORDER — MIDAZOLAM HCL 5 MG/5ML IJ SOLN
INTRAMUSCULAR | Status: DC | PRN
  Administered 2021-09-13 (×2): 1 mg via INTRAVENOUS

## 2021-09-13 MED ORDER — FLEET ENEMA 7-19 GM/118ML RE ENEM
1.0000 | ENEMA | Freq: Once | RECTAL | Status: DC | PRN
Start: 2021-09-13 — End: 2021-09-15

## 2021-09-13 MED ORDER — FENTANYL CITRATE (PF) 100 MCG/2ML IJ SOLN
INTRAMUSCULAR | Status: AC
  Filled 2021-09-13: qty 2

## 2021-09-13 MED ORDER — HYDROMORPHONE HCL 1 MG/ML IJ SOLN
0.2500 mg | INTRAMUSCULAR | Status: DC | PRN
  Administered 2021-09-13 (×4): 0.5 mg via INTRAVENOUS

## 2021-09-13 MED ORDER — TRAMADOL HCL 50 MG PO TABS
50.0000 mg | ORAL_TABLET | Freq: Four times a day (QID) | ORAL | Status: DC | PRN
  Administered 2021-09-14: 100 mg via ORAL
  Filled 2021-09-13: qty 2

## 2021-09-13 MED ORDER — PROPOFOL 1000 MG/100ML IV EMUL
INTRAVENOUS | Status: AC
  Filled 2021-09-13: qty 100

## 2021-09-13 MED ORDER — DEXAMETHASONE SODIUM PHOSPHATE 10 MG/ML IJ SOLN
8.0000 mg | Freq: Once | INTRAMUSCULAR | Status: AC
Start: 2021-09-13 — End: 2021-09-13
  Administered 2021-09-13: 10 mg via INTRAVENOUS

## 2021-09-13 MED ORDER — ONDANSETRON HCL 4 MG PO TABS: 4.0000 mg | ORAL_TABLET | Freq: Four times a day (QID) | ORAL | Status: DC | PRN

## 2021-09-13 MED ORDER — BISACODYL 10 MG RE SUPP: 10.0000 mg | Freq: Every day | RECTAL | Status: DC | PRN

## 2021-09-13 MED ORDER — METOCLOPRAMIDE HCL 5 MG/ML IJ SOLN: 5.0000 mg | Freq: Three times a day (TID) | INTRAMUSCULAR | Status: DC | PRN

## 2021-09-13 SURGICAL SUPPLY — 56 items
ATTUNE MED DOME PAT 32 KNEE (Knees) ×2 IMPLANT
ATTUNE PSFEM RTSZ4 NARCEM KNEE (Femur) ×2 IMPLANT
ATTUNE PSRP INSR SZ4 7 KNEE (Insert) ×2 IMPLANT
BAG COUNTER SPONGE SURGICOUNT (BAG) IMPLANT
BAG ZIPLOCK 12X15 (MISCELLANEOUS) ×2 IMPLANT
BASE TIBIAL ROT PLAT SZ 3 KNEE (Knees) ×1 IMPLANT
BLADE SAG 18X100X1.27 (BLADE) ×2 IMPLANT
BLADE SAW SGTL 11.0X1.19X90.0M (BLADE) ×2 IMPLANT
BNDG ELASTIC 6X10 VLCR STRL LF (GAUZE/BANDAGES/DRESSINGS) ×2 IMPLANT
BNDG ELASTIC 6X5.8 VLCR STR LF (GAUZE/BANDAGES/DRESSINGS) ×2 IMPLANT
BOWL SMART MIX CTS (DISPOSABLE) ×2 IMPLANT
CEMENT HV SMART SET (Cement) ×4 IMPLANT
COVER SURGICAL LIGHT HANDLE (MISCELLANEOUS) ×2 IMPLANT
CUFF TOURN SGL QUICK 34 (TOURNIQUET CUFF) ×2
CUFF TRNQT CYL 34X4.125X (TOURNIQUET CUFF) ×1 IMPLANT
DECANTER SPIKE VIAL GLASS SM (MISCELLANEOUS) ×2 IMPLANT
DRAPE INCISE IOBAN 66X45 STRL (DRAPES) ×2 IMPLANT
DRAPE U-SHAPE 47X51 STRL (DRAPES) ×2 IMPLANT
DRESSING AQUACEL AG SP 3.5X10 (GAUZE/BANDAGES/DRESSINGS) ×1 IMPLANT
DRSG AQUACEL AG ADV 3.5X10 (GAUZE/BANDAGES/DRESSINGS) ×2 IMPLANT
DRSG AQUACEL AG SP 3.5X10 (GAUZE/BANDAGES/DRESSINGS) ×2
DURAPREP 26ML APPLICATOR (WOUND CARE) ×2 IMPLANT
ELECT REM PT RETURN 15FT ADLT (MISCELLANEOUS) ×2 IMPLANT
GLOVE SRG 8 PF TXTR STRL LF DI (GLOVE) ×1 IMPLANT
GLOVE SURG ENC MOIS LTX SZ6.5 (GLOVE) ×2 IMPLANT
GLOVE SURG ENC MOIS LTX SZ8 (GLOVE) ×4 IMPLANT
GLOVE SURG UNDER POLY LF SZ7 (GLOVE) ×2 IMPLANT
GLOVE SURG UNDER POLY LF SZ8 (GLOVE) ×2
GLOVE SURG UNDER POLY LF SZ8.5 (GLOVE) ×2 IMPLANT
GOWN STRL REUS W/TWL LRG LVL3 (GOWN DISPOSABLE) ×4 IMPLANT
GOWN STRL REUS W/TWL XL LVL3 (GOWN DISPOSABLE) ×2 IMPLANT
HANDPIECE INTERPULSE COAX TIP (DISPOSABLE) ×2
HOLDER FOLEY CATH W/STRAP (MISCELLANEOUS) IMPLANT
IMMOBILIZER KNEE 20 (SOFTGOODS) ×2
IMMOBILIZER KNEE 20 THIGH 36 (SOFTGOODS) ×1 IMPLANT
KIT TURNOVER KIT A (KITS) ×2 IMPLANT
MANIFOLD NEPTUNE II (INSTRUMENTS) ×2 IMPLANT
NS IRRIG 1000ML POUR BTL (IV SOLUTION) ×2 IMPLANT
PACK TOTAL KNEE CUSTOM (KITS) ×2 IMPLANT
PADDING CAST COTTON 6X4 STRL (CAST SUPPLIES) ×2 IMPLANT
PIN DRILL FIX HALF THREAD (BIT) ×2 IMPLANT
PIN STEINMAN FIXATION KNEE (PIN) ×2 IMPLANT
PROTECTOR NERVE ULNAR (MISCELLANEOUS) ×2 IMPLANT
SET HNDPC FAN SPRY TIP SCT (DISPOSABLE) ×1 IMPLANT
STRIP CLOSURE SKIN 1/2X4 (GAUZE/BANDAGES/DRESSINGS) ×4 IMPLANT
SUT MNCRL AB 4-0 PS2 18 (SUTURE) ×2 IMPLANT
SUT STRATAFIX 0 PDS 27 VIOLET (SUTURE) ×2
SUT VIC AB 2-0 CT1 27 (SUTURE) ×6
SUT VIC AB 2-0 CT1 TAPERPNT 27 (SUTURE) ×3 IMPLANT
SUTURE STRATFX 0 PDS 27 VIOLET (SUTURE) ×1 IMPLANT
TAPE STRIPS DRAPE STRL (GAUZE/BANDAGES/DRESSINGS) ×2 IMPLANT
TIBIAL BASE ROT PLAT SZ 3 KNEE (Knees) ×2 IMPLANT
TRAY FOLEY MTR SLVR 16FR STAT (SET/KITS/TRAYS/PACK) ×2 IMPLANT
TUBE SUCTION HIGH CAP CLEAR NV (SUCTIONS) ×2 IMPLANT
WATER STERILE IRR 1000ML POUR (IV SOLUTION) ×4 IMPLANT
WRAP KNEE MAXI GEL POST OP (GAUZE/BANDAGES/DRESSINGS) ×2 IMPLANT

## 2021-09-13 NOTE — Op Note (Signed)
OPERATIVE REPORT-TOTAL KNEE ARTHROPLASTY   Pre-operative diagnosis- Osteoarthritis  Right knee(s)  Post-operative diagnosis- Osteoarthritis Right knee(s)  Procedure-  Right  Total Knee Arthroplasty  Surgeon- Summer Jackson. Tenae Graziosi, MD  Assistant- Theresa Duty, PA-C   Anesthesia-  GA combined with regional for post-op pain  EBL- 25 ml   Drains None  Tourniquet time-  Total Tourniquet Time Documented: Thigh (Right) - 29 minutes Total: Thigh (Right) - 29 minutes     Complications- None  Condition-PACU - hemodynamically stable.   Brief Clinical Note  Summer Jackson is a 75 y.o. 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 She has constant pain, with activity and at rest and significant functional deficits with difficulties even with ADLs. She has had extensive non-op management including analgesics, injections of cortisone and viscosupplements, and home exercise program, but remains in significant pain with significant dysfunction.Radiographs show bone on bone arthritis lateral and patellofemoral. She presents now for right Total Knee Arthroplasty.     Procedure in detail---   The patient is brought into the operating room and positioned supine on the operating table. After successful administration of  GA combined with regional for post-op pain,   a tourniquet is placed high on the  Right thigh(s) and the lower extremity is prepped and draped in the usual sterile fashion. Time out is performed by the operating team and then the  Right lower extremity is wrapped in Esmarch, knee flexed and the tourniquet inflated to 300 mmHg.       A midline incision is made with a ten blade through the subcutaneous tissue to the level of the extensor mechanism. A fresh blade is used to make a medial parapatellar arthrotomy. Soft tissue over the proximal medial tibia is subperiosteally elevated to the joint line with a knife and into the  semimembranosus bursa with a Cobb elevator. Soft tissue over the proximal lateral tibia is elevated with attention being paid to avoiding the patellar tendon on the tibial tubercle. The patella is everted, knee flexed 90 degrees and the ACL and PCL are removed. Findings are bone on bone lateral and patellofemoral with large global osteophytes        The drill is used to create a starting hole in the distal femur and the canal is thoroughly irrigated with sterile saline to remove the fatty contents. The 5 degree Right  valgus alignment guide is placed into the femoral canal and the distal femoral cutting block is pinned to remove 9 mm off the distal femur. Resection is made with an oscillating saw.      The tibia is subluxed forward and the menisci are removed. The extramedullary alignment guide is placed referencing proximally at the medial aspect of the tibial tubercle and distally along the second metatarsal axis and tibial crest. The block is pinned to remove 61mm off the more deficient lateral  side. Resection is made with an oscillating saw. Size 3is the most appropriate size for the tibia and the proximal tibia is prepared with the modular drill and keel punch for that size.      The femoral sizing guide is placed and size 4 is most appropriate. Rotation is marked off the epicondylar axis and confirmed by creating a rectangular flexion gap at 90 degrees. The size 4 cutting block is pinned in this rotation and the anterior, posterior and chamfer cuts are made with the oscillating saw. The intercondylar block is then placed and  that cut is made.      Trial size 3 tibial component, trial size 4 narrow posterior stabilized femur and a 7  mm posterior stabilized rotating platform insert trial is placed. Full extension is achieved with excellent varus/valgus and anterior/posterior balance throughout full range of motion. The patella is everted and thickness measured to be 21  mm. Free hand resection is taken to  12 mm, a 32 template is placed, lug holes are drilled, trial patella is placed, and it tracks normally. Osteophytes are removed off the posterior femur with the trial in place. All trials are removed and the cut bone surfaces prepared with pulsatile lavage. Cement is mixed and once ready for implantation, the size 3 tibial implant, size  4 narrow posterior stabilized femoral component, and the size 32 patella are cemented in place and the patella is held with the clamp. The trial insert is placed and the knee held in full extension. The Exparel (20 ml mixed with 60 ml saline) is injected into the extensor mechanism, posterior capsule, medial and lateral gutters and subcutaneous tissues.  All extruded cement is removed and once the cement is hard the permanent 7 mm posterior stabilized rotating platform insert is placed into the tibial tray.      The wound is copiously irrigated with saline solution and the extensor mechanism closed with # 0 Stratofix suture. The tourniquet is released for a total tourniquet time of 29  minutes. Flexion against gravity is 140 degrees and the patella tracks normally. Subcutaneous tissue is closed with 2.0 vicryl and subcuticular with running 4.0 Monocryl. The incision is cleaned and dried and steri-strips and a bulky sterile dressing are applied. The limb is placed into a knee immobilizer and the patient is awakened and transported to recovery in stable condition.      Please note that a surgical assistant was a medical necessity for this procedure in order to perform it in a safe and expeditious manner. Surgical assistant was necessary to retract the ligaments and vital neurovascular structures to prevent injury to them and also necessary for proper positioning of the limb to allow for anatomic placement of the prosthesis.   Summer Jackson Summer Fujiwara, MD    09/13/2021, 8:19 AM

## 2021-09-13 NOTE — Discharge Instructions (Signed)
 Frank Aluisio, MD Total Joint Specialist [facility] [place] [address] [place], [place] [address] [phone]  TOTAL KNEE REPLACEMENT POSTOPERATIVE DIRECTIONS    Knee Rehabilitation, Guidelines Following Surgery  Results after knee surgery are often greatly improved when you follow the exercise, range of motion and muscle strengthening exercises prescribed by your doctor. Safety measures are also important to protect the knee from further injury. If any of these exercises cause you to have increased pain or swelling in your knee joint, decrease the amount until you are comfortable again and slowly increase them. If you have problems or questions, call your caregiver or physical therapist for advice.    BLOOD CLOT PREVENTION Take a 10 mg Xarelto once a day for three weeks following surgery. Then take an 81 mg Aspirin once a day for three weeks. Then discontinue Aspirin. You may resume your vitamins/supplements once you have discontinued the Xarelto. Do not take any NSAIDs (Advil, Aleve, Ibuprofen, Meloxicam, etc.) until you have discontinued the Xarelto.    HOME CARE INSTRUCTIONS  Remove items at home which could result in a fall. This includes throw rugs or furniture in walking pathways.  ICE to the affected knee as much as tolerated. Icing helps control swelling. If the swelling is well controlled you will be more comfortable and rehab easier. Continue to use ice on the knee for pain and swelling from surgery. You may notice swelling that will progress down to the foot and ankle. This is normal after surgery. Elevate the leg when you are not up walking on it.    Continue to use the breathing machine which will help keep your temperature down. It is common for your temperature to cycle up and down following surgery, especially at night when you are not up moving around and exerting yourself. The breathing machine keeps your lungs expanded and your temperature  down. Do not place pillow under the operative knee, focus on keeping the knee straight while resting  DIET You may resume your previous home diet once you are discharged from the hospital.  DRESSING / WOUND CARE / SHOWERING Keep your bulky bandage on for 2 days. On the third post-operative day you may remove the Ace bandage and gauze. There is a waterproof adhesive bandage on your skin which will stay in place until your first follow-up appointment. Once you remove this you will not need to place another bandage You may begin showering 3 days following surgery, but do not submerge the incision under water.  ACTIVITY For the first 5 days, the key is rest and control of pain and swelling Do your home exercises twice a day starting on post-operative day 3. On the days you go to physical therapy, just do the home exercises once that day. You should rest, ice and elevate the leg for 50 minutes out of every hour. Get up and walk/stretch for 10 minutes per hour. After 5 days you can increase your activity slowly as tolerated. Walk with your walker as instructed. Use the walker until you are comfortable transitioning to a cane. Walk with the cane in the opposite hand of the operative leg. You may discontinue the cane once you are comfortable and walking steadily. Avoid periods of inactivity such as sitting longer than an hour when not asleep. This helps prevent blood clots.  You may discontinue the knee immobilizer once you are able to perform a straight leg raise while lying down. You may resume a sexual relationship in one   month or when given the OK by your doctor.  You may return to work once you are cleared by your doctor.  Do not drive a car for 6 weeks or until released by your surgeon.  Do not drive while taking narcotics.  TED HOSE STOCKINGS Wear the elastic stockings on both legs for three weeks following surgery during the day. You may remove them at night for sleeping.  WEIGHT  BEARING Weight bearing as tolerated with assist device (walker, cane, etc) as directed, use it as long as suggested by your surgeon or therapist, typically at least 4-6 weeks.  POSTOPERATIVE CONSTIPATION PROTOCOL Constipation - defined medically as fewer than three stools per week and severe constipation as less than one stool per week.  One of the most common issues patients have following surgery is constipation.  Even if you have a regular bowel pattern at home, your normal regimen is likely to be disrupted due to multiple reasons following surgery.  Combination of anesthesia, postoperative narcotics, change in appetite and fluid intake all can affect your bowels.  In order to avoid complications following surgery, here are some recommendations in order to help you during your recovery period.  Colace (docusate) - Pick up an over-the-counter form of Colace or another stool softener and take twice a day as long as you are requiring postoperative pain medications.  Take with a full glass of water daily.  If you experience loose stools or diarrhea, hold the colace until you stool forms back up. If your symptoms do not get better within 1 week or if they get worse, check with your doctor. Dulcolax (bisacodyl) - Pick up over-the-counter and take as directed by the product packaging as needed to assist with the movement of your bowels.  Take with a full glass of water.  Use this product as needed if not relieved by Colace only.  MiraLax (polyethylene glycol) - Pick up over-the-counter to have on hand. MiraLax is a solution that will increase the amount of water in your bowels to assist with bowel movements.  Take as directed and can mix with a glass of water, juice, soda, coffee, or tea. Take if you go more than two days without a movement. Do not use MiraLax more than once per day. Call your doctor if you are still constipated or irregular after using this medication for 7 days in a row.  If you continue  to have problems with postoperative constipation, please contact the office for further assistance and recommendations.  If you experience "the worst abdominal pain ever" or develop nausea or vomiting, please contact the office immediatly for further recommendations for treatment.  ITCHING If you experience itching with your medications, try taking only a single pain pill, or even half a pain pill at a time.  You can also use Benadryl over the counter for itching or also to help with sleep.   MEDICATIONS See your medication summary on the "After Visit Summary" that the nursing staff will review with you prior to discharge.  You may have some home medications which will be placed on hold until you complete the course of blood thinner medication.  It is important for you to complete the blood thinner medication as prescribed by your surgeon.  Continue your approved medications as instructed at time of discharge.  PRECAUTIONS If you experience chest pain or shortness of breath - call 911 immediately for transfer to the hospital emergency department.  If you develop a fever greater that 101   F, purulent drainage from wound, increased redness or drainage from wound, foul odor from the wound/dressing, or calf pain - CONTACT YOUR SURGEON.                                                   FOLLOW-UP APPOINTMENTS Make sure you keep all of your appointments after your operation with your surgeon and caregivers. You should call the office at the above phone number and make an appointment for approximately two weeks after the date of your surgery or on the date instructed by your surgeon outlined in the "After Visit Summary".  RANGE OF MOTION AND STRENGTHENING EXERCISES  Rehabilitation of the knee is important following a knee injury or an operation. After just a few days of immobilization, the muscles of the thigh which control the knee become weakened and shrink (atrophy). Knee exercises are designed to build up  the tone and strength of the thigh muscles and to improve knee motion. Often times heat used for twenty to thirty minutes before working out will loosen up your tissues and help with improving the range of motion but do not use heat for the first two weeks following surgery. These exercises can be done on a training (exercise) mat, on the floor, on a table or on a bed. Use what ever works the best and is most comfortable for you Knee exercises include:  Leg Lifts - While your knee is still immobilized in a splint or cast, you can do straight leg raises. Lift the leg to 60 degrees, hold for 3 sec, and slowly lower the leg. Repeat 10-20 times 2-3 times daily. Perform this exercise against resistance later as your knee gets better.  Quad and Hamstring Sets - Tighten up the muscle on the front of the thigh (Quad) and hold for 5-10 sec. Repeat this 10-20 times hourly. Hamstring sets are done by pushing the foot backward against an object and holding for 5-10 sec. Repeat as with quad sets.  Leg Slides: Lying on your back, slowly slide your foot toward your buttocks, bending your knee up off the floor (only go as far as is comfortable). Then slowly slide your foot back down until your leg is flat on the floor again. Angel Wings: Lying on your back spread your legs to the side as far apart as you can without causing discomfort.  A rehabilitation program following serious knee injuries can speed recovery and prevent re-injury in the future due to weakened muscles. Contact your doctor or a physical therapist for more information on knee rehabilitation.   POST-OPERATIVE OPIOID TAPER INSTRUCTIONS: It is important to wean off of your opioid medication as soon as possible. If you do not need pain medication after your surgery it is ok to stop day one. Opioids include: Codeine, Hydrocodone(Norco, Vicodin), Oxycodone(Percocet, oxycontin) and hydromorphone amongst others.  Long term and even short term use of opiods can  cause: Increased pain response Dependence Constipation Depression Respiratory depression And more.  Withdrawal symptoms can include Flu like symptoms Nausea, vomiting And more Techniques to manage these symptoms Hydrate well Eat regular healthy meals Stay active Use relaxation techniques(deep breathing, meditating, yoga) Do Not substitute Alcohol to help with tapering If you have been on opioids for less than two weeks and do not have pain than it is ok to stop all together.    plan should start within one week post op of your joint replacement. Maintain the same interval or time between taking each dose and first decrease the dose.  Cut the total daily intake of opioids by one tablet each day Next start to increase the time between doses. The last dose that should be eliminated is the evening dose.   IF YOU ARE TRANSFERRED TO A SKILLED REHAB FACILITY If the patient is transferred to a skilled rehab facility following release from the hospital, a list of the current medications will be sent to the facility for the patient to continue.  When discharged from the skilled rehab facility, please have the facility set up the patient's Home Health Physical Therapy prior to being released. Also, the skilled facility will be responsible for providing the patient with their medications at time of release from the facility to include their pain medication, the muscle relaxants, and their blood thinner medication. If the patient is still at the rehab facility at time of the two week follow up appointment, the skilled rehab facility will also need to assist the patient in arranging follow up appointment in our office and any transportation needs.  MAKE SURE YOU:  Understand these instructions.  Get help right away if you are not doing well or get worse.   DENTAL ANTIBIOTICS:  In most cases prophylactic antibiotics for Dental procdeures after total joint surgery are not  necessary.  Exceptions are as follows:  1. History of prior total joint infection  2. Severely immunocompromised (Organ Transplant, cancer chemotherapy, Rheumatoid biologic meds such as Humera)  3. Poorly controlled diabetes (A1C &gt; 8.0, blood glucose over 200)  If you have one of these conditions, contact your surgeon for an antibiotic prescription, prior to your dental procedure.    Pick up stool softner and laxative for home use following surgery while on pain medications. Do not submerge incision under water. Please use good hand washing techniques while changing dressing each day. May shower starting three days after surgery. Please use a clean towel to pat the incision dry following showers. Continue to use ice for pain and swelling after surgery. Do not use any lotions or creams on the incision until instructed by your surgeon.  

## 2021-09-13 NOTE — Interval H&P Note (Signed)
History and Physical Interval Note:  09/13/2021 6:27 AM  Summer Jackson  has presented today for surgery, with the diagnosis of right knee osteoarthritis.  The various methods of treatment have been discussed with the patient and family. After consideration of risks, benefits and other options for treatment, the patient has consented to  Procedure(s): TOTAL KNEE ARTHROPLASTY (Right) as a surgical intervention.  The patient's history has been reviewed, patient examined, no change in status, stable for surgery.  I have reviewed the patient's chart and labs.  Questions were answered to the patient's satisfaction.     Pilar Plate Dula Havlik

## 2021-09-13 NOTE — Plan of Care (Signed)
?  Problem: Education: ?Goal: Knowledge of General Education information will improve ?Description: Including pain rating scale, medication(s)/side effects and non-pharmacologic comfort measures ?Outcome: Progressing ?  ?Problem: Activity: ?Goal: Risk for activity intolerance will decrease ?Outcome: Progressing ?  ?Problem: Nutrition: ?Goal: Adequate nutrition will be maintained ?Outcome: Progressing ?  ?Problem: Elimination: ?Goal: Will not experience complications related to bowel motility ?Outcome: Progressing ?  ?Problem: Pain Managment: ?Goal: General experience of comfort will improve ?Outcome: Progressing ?  ?Problem: Safety: ?Goal: Ability to remain free from injury will improve ?Outcome: Progressing ?  ?Problem: Education: ?Goal: Knowledge of the prescribed therapeutic regimen will improve ?Outcome: Progressing ?  ?Problem: Activity: ?Goal: Ability to avoid complications of mobility impairment will improve ?Outcome: Progressing ?  ?Problem: Pain Management: ?Goal: Pain level will decrease with appropriate interventions ?Outcome: Progressing ?  ?

## 2021-09-13 NOTE — Anesthesia Procedure Notes (Signed)
Anesthesia Regional Block: Adductor canal block   Pre-Anesthetic Checklist: , timeout performed,  Correct Patient, Correct Site, Correct Laterality,  Correct Procedure, Correct Position, site marked,  Risks and benefits discussed,  Surgical consent,  Pre-op evaluation,  At surgeon's request and post-op pain management  Laterality: Right  Prep: chloraprep       Needles:  Injection technique: Single-shot  Needle Type: Echogenic Needle     Needle Length: 9cm      Additional Needles:   Procedures:,,,, ultrasound used (permanent image in chart),,    Narrative:  Start time: 09/13/2021 6:53 AM End time: 09/13/2021 7:00 AM Injection made incrementally with aspirations every 5 mL.  Performed by: Personally  Anesthesiologist: Myrtie Soman, MD  Additional Notes: Patient tolerated the procedure well without complications

## 2021-09-13 NOTE — Evaluation (Signed)
Physical Therapy Evaluation Patient Details Name: Summer Jackson MRN: [id number] DOB: July 14, 1946 Today's Date: 09/13/2021  History of Present Illness  75 yo female s/p R TKA 09/13/21. Hx of obesity, DM, back sg, L TKA 2014  Clinical Impression  On eval POD 0, pt required Min A for mobility. She walked ~40 feet with a RW. Pt rated pain 9/10. She has some concerns about being ready to d/c home tomorrow and about starting OP PT on Thurs-encouraged her to discuss with PA/MD. Will follow and progress activity as tolerated.       Recommendations for follow up therapy are one component of a multi-disciplinary discharge planning process, led by the attending physician.  Recommendations may be updated based on patient status, additional functional criteria and insurance authorization.  Follow Up Recommendations Follow surgeon's recommendation for DC plan and follow-up therapies    Equipment Recommendations  Rolling walker with 5" wheels (youth height)    Recommendations for Other Services       Precautions / Restrictions Precautions Precautions: Fall;Knee Required Braces or Orthoses: Knee Immobilizer - Right Knee Immobilizer - Right: Discontinue once straight leg raise with < 10 degree lag Restrictions Weight Bearing Restrictions: No Other Position/Activity Restrictions: WBAT      Mobility  Bed Mobility Overal bed mobility: Needs Assistance Bed Mobility: Supine to Sit;Sit to Supine     Supine to sit: Min assist;HOB elevated Sit to supine: Min assist;HOB elevated   General bed mobility comments: Assist for R LE.    Transfers Overall transfer level: Needs assistance Equipment used: Rolling walker (2 wheeled) Transfers: Sit to/from [facility] Sit to Stand: Min assist Stand pivot transfers: Min assist       General transfer comment: Assist to rise, steady, control descent. Cues for safety, hand/LE placement. St pivot, bed to bsc, with  RW  Ambulation/Gait Ambulation/Gait assistance: Min Web designer (Feet): 40 Feet Assistive device: Rolling walker (2 wheeled) Gait Pattern/deviations: Step-to pattern     General Gait Details: Min A to steady throughout distance. Pt c/o some nausea but denied dizziness.  Stairs            Wheelchair Mobility    Modified Rankin (Stroke Patients Only)       Balance Overall balance assessment: Needs assistance         Standing balance support: Bilateral upper extremity supported Standing balance-Leahy Scale: Poor                               Pertinent Vitals/Pain Pain Assessment: 0-10 Pain Score: 9  Pain Location: R knee Pain Descriptors / Indicators: Discomfort;Sore Pain Intervention(s): Repositioned;Ice applied    Home Living Family/patient expects to be discharged to:: Private residence Living Arrangements: Children Available Help at Discharge: Family Type of Home: House Home Access: Stairs to enter Entrance Stairs-Rails: None Entrance Stairs-Number of Steps: 1 Home Layout: One level Home Equipment: Environmental consultant - 2 wheels      Prior Function Level of Independence: Independent               Hand Dominance        Extremity/Trunk Assessment   Upper Extremity Assessment Upper Extremity Assessment: Overall WFL for tasks assessed    Lower Extremity Assessment Lower Extremity Assessment: Generalized weakness    Cervical / Trunk Assessment Cervical / Trunk Assessment: Normal  Communication   Communication: HOH  Cognition Arousal/Alertness: Awake/alert Behavior During Therapy: WFL for tasks assessed/performed Overall  Cognitive Status: Within Functional Limits for tasks assessed                                        General Comments      Exercises     Assessment/Plan    PT Assessment Patient needs continued PT services  PT Problem List Decreased strength;Decreased range of motion;Decreased  mobility;Decreased activity tolerance;Decreased balance;Pain;Decreased knowledge of use of DME       PT Treatment Interventions DME instruction;Gait training;Therapeutic exercise;Balance training;Functional mobility training;Therapeutic activities;Patient/family education;Stair training    PT Goals (Current goals can be found in the Care Plan section)  Acute Rehab PT Goals Patient Stated Goal: less pain. regain PLOF/independence PT Goal Formulation: With patient Time For Goal Achievement: 09/27/21 Potential to Achieve Goals: Good    Frequency 7X/week   Barriers to discharge        Co-evaluation               AM-PAC PT "6 Clicks" Mobility  Outcome Measure Help needed turning from your back to your side while in a flat bed without using bedrails?: A Little Help needed moving from lying on your back to sitting on the side of a flat bed without using bedrails?: A Little Help needed moving to and from a bed to a chair (including a wheelchair)?: A Little Help needed standing up from a chair using your arms (e.g., wheelchair or bedside chair)?: A Little Help needed to walk in hospital room?: A Little Help needed climbing 3-5 steps with a railing? : A Little 6 Click Score: 18    End of Session Equipment Utilized During Treatment: Gait belt;Right knee immobilizer Activity Tolerance: Patient limited by pain Patient left: in bed;with call bell/phone within reach;with bed alarm set   PT Visit Diagnosis: Other abnormalities of gait and mobility (R26.89);Pain Pain - Right/Left: Right Pain - part of body: Knee    Time: 3151-7616 PT Time Calculation (min) (ACUTE ONLY): 33 min   Charges:   PT Evaluation $PT Eval Low Complexity: 1 Low PT Treatments $Gait Training: 8-22 mins         Doreatha Massed, PT Acute Rehabilitation  Office: [phone] Pager: [phone]

## 2021-09-13 NOTE — Anesthesia Postprocedure Evaluation (Signed)
Anesthesia Post Note  Patient: Summer Jackson  Procedure(s) Performed: TOTAL KNEE ARTHROPLASTY (Right: Knee)     Patient location during evaluation: PACU Anesthesia Type: General Level of consciousness: awake and alert Pain management: pain level controlled Vital Signs Assessment: post-procedure vital signs reviewed and stable Respiratory status: spontaneous breathing, nonlabored ventilation, respiratory function stable and patient connected to nasal cannula oxygen Cardiovascular status: blood pressure returned to baseline and stable Postop Assessment: no apparent nausea or vomiting Anesthetic complications: no   No notable events documented.  Last Vitals:  Vitals:   09/13/21 1053 09/13/21 1250  BP: (!) 144/85 138/83  Pulse: 81 72  Resp: 16 18  Temp: 36.4 C   SpO2: 97% 99%    Last Pain:  Vitals:   09/13/21 1345  TempSrc:   PainSc: Asleep                 Chelsee Hosie S

## 2021-09-13 NOTE — Progress Notes (Signed)
Orthopedic Tech Progress Note Patient Details:  Summer Jackson 09/01/1946 [id number]  CPM Right Knee CPM Right Knee: On  Post Interventions Patient Tolerated: Well Instructions Provided: Care of device  Summer Jackson 09/13/2021, 8:42 AM

## 2021-09-13 NOTE — Anesthesia Procedure Notes (Signed)
Anesthesia Procedure Image    

## 2021-09-13 NOTE — Transfer of Care (Signed)
Immediate Anesthesia Transfer of Care Note  Patient: Summer Jackson  Procedure(s) Performed: TOTAL KNEE ARTHROPLASTY (Right: Knee)  Patient Location: PACU  Anesthesia Type:General  Level of Consciousness: awake, alert  and drowsy  Airway & Oxygen Therapy: Patient Spontanous Breathing and Patient connected to face mask oxygen  Post-op Assessment: Report given to RN and Post -op Vital signs reviewed and stable  Post vital signs: Reviewed and stable  Last Vitals:  Vitals Value Taken Time  BP 167/86 09/13/21 0836  Temp    Pulse 93 09/13/21 0841  Resp 10 09/13/21 0841  SpO2 100 % 09/13/21 0841  Vitals shown include unvalidated device data.  Last Pain:  Vitals:   09/13/21 0541  TempSrc: Oral  PainSc: 0-No pain      Patients Stated Pain Goal: 5 (65/03/54 6568)  Complications: No notable events documented.

## 2021-09-13 NOTE — Anesthesia Procedure Notes (Signed)
Procedure Name: LMA Insertion Date/Time: 09/13/2021 7:22 AM Performed by: [place] Marqui Formby C, CRNA Pre-anesthesia Checklist: Patient identified, Emergency Drugs available, Suction available and Patient being monitored Patient Re-evaluated:Patient Re-evaluated prior to induction Oxygen Delivery Method: Circle system utilized Preoxygenation: Pre-oxygenation with 100% oxygen Induction Type: IV induction Ventilation: Mask ventilation without difficulty LMA: LMA inserted LMA Size: 4.0 Number of attempts: 1 Airway Equipment and Method: Bite block Placement Confirmation: positive ETCO2 Tube secured with: Tape Dental Injury: Teeth and Oropharynx as per pre-operative assessment

## 2021-09-13 NOTE — Plan of Care (Signed)
  Problem: Nutrition: Goal: Adequate nutrition will be maintained Outcome: Progressing   Problem: Pain Managment: Goal: General experience of comfort will improve Outcome: Progressing   

## 2021-09-14 ENCOUNTER — Encounter (HOSPITAL_COMMUNITY): Payer: Self-pay | Admitting: Orthopedic Surgery

## 2021-09-14 DIAGNOSIS — M1711 Unilateral primary osteoarthritis, right knee: Secondary | ICD-10-CM | POA: Diagnosis not present

## 2021-09-14 LAB — CBC
HCT: 33.2 % — ABNORMAL LOW (ref 36.0–46.0)
Hemoglobin: 11 g/dL — ABNORMAL LOW (ref 12.0–15.0)
MCH: 28.8 pg (ref 26.0–34.0)
MCHC: 33.1 g/dL (ref 30.0–36.0)
MCV: 86.9 fL (ref 80.0–100.0)
Platelets: 241 10*3/uL (ref 150–400)
RBC: 3.82 MIL/uL — ABNORMAL LOW (ref 3.87–5.11)
RDW: 14.5 % (ref 11.5–15.5)
WBC: 10.7 10*3/uL — ABNORMAL HIGH (ref 4.0–10.5)
nRBC: 0 % (ref 0.0–0.2)

## 2021-09-14 LAB — BASIC METABOLIC PANEL
Anion gap: 6 (ref 5–15)
BUN: 13 mg/dL (ref 8–23)
CO2: 26 mmol/L (ref 22–32)
Calcium: 8.5 mg/dL — ABNORMAL LOW (ref 8.9–10.3)
Chloride: 100 mmol/L (ref 98–111)
Creatinine, Ser: 0.54 mg/dL (ref 0.44–1.00)
GFR, Estimated: >60 mL/min (ref >60)
Glucose, Bld: 130 mg/dL — ABNORMAL HIGH (ref 70–99)
Potassium: 3.8 mmol/L (ref 3.5–5.1)
Sodium: 132 mmol/L — ABNORMAL LOW (ref 135–145)

## 2021-09-14 MED ORDER — METHOCARBAMOL 500 MG PO TABS: 500.0000 mg | ORAL_TABLET | Freq: Four times a day (QID) | ORAL | 0 refills | Status: DC | PRN

## 2021-09-14 MED ORDER — TRAMADOL HCL 50 MG PO TABS: 50.0000 mg | ORAL_TABLET | Freq: Four times a day (QID) | ORAL | 0 refills | Status: DC | PRN

## 2021-09-14 MED ORDER — GABAPENTIN 300 MG PO CAPS: ORAL_CAPSULE | ORAL | 0 refills | Status: DC

## 2021-09-14 MED ORDER — OXYCODONE HCL 5 MG PO TABS: 5.0000 mg | ORAL_TABLET | Freq: Four times a day (QID) | ORAL | 0 refills | Status: DC | PRN

## 2021-09-14 MED ORDER — ASPIRIN 325 MG PO TBEC: 325.0000 mg | DELAYED_RELEASE_TABLET | Freq: Two times a day (BID) | ORAL | 0 refills | Status: AC

## 2021-09-14 NOTE — Progress Notes (Signed)
Orthopedic Tech Progress Note Patient Details:  Summer Jackson [birth year] [id number]  Patient ID: [name], female   DOB: 08-04-1946, 75 y.o.   MRN: [id number]  Summer Jackson 09/14/2021, 11:22 AMPickup Equipment

## 2021-09-14 NOTE — TOC Transition Note (Signed)
Transition of Care [facility]) - CM/SW Discharge Note   Patient Details  Name: Summer Jackson MRN: [id number] Date of Birth: October 14, 1946  Transition of Care [facility]) CM/SW Contact:  Lennart Pall, LCSW Phone Number: 09/14/2021, 1:11 PM   Clinical Narrative:    Met with pt and confirming she has received rw via [facility] (prearranged).  Plan for OPPT in [facility].  No further TOC needs.   Final next level of care: OP Rehab Barriers to Discharge: No Barriers Identified   Patient Goals and CMS Choice Patient states their goals for this hospitalization and ongoing recovery are:: return home      Discharge Placement                       Discharge Plan and Services                DME Arranged: Walker rolling DME Agency: [facility]                  Social Determinants of Health (SDOH) Interventions     Readmission Risk Interventions No flowsheet data found.

## 2021-09-14 NOTE — Progress Notes (Signed)
Physical Therapy Treatment Patient Details Name: Summer Jackson MRN: [id number] DOB: 1946-02-22 Today's Date: 09/14/2021   History of Present Illness 75 yo female s/p R TKA 09/13/21. Hx of obesity, DM, back sg, L TKA 2014    PT Comments    Progressing slowly with mobility. Pt continues to c/o severe pain with activity. She walked a slightly longer distance this afternoon. She is also concerned about her BP. Pt has not yet met her PT goals. She does not feel ready to d/c home on today-will update RN. Will continue to follow and progress activity as tolerated.     Recommendations for follow up therapy are one component of a multi-disciplinary discharge planning process, led by the attending physician.  Recommendations may be updated based on patient status, additional functional criteria and insurance authorization.  Follow Up Recommendations  Follow surgeon's recommendation for DC plan and follow-up therapies     Equipment Recommendations       Recommendations for Other Services       Precautions / Restrictions Precautions Precautions: Fall;Knee Required Braces or Orthoses: Knee Immobilizer - Right Knee Immobilizer - Right: Discontinue once straight leg raise with < 10 degree lag Restrictions Weight Bearing Restrictions: No Other Position/Activity Restrictions: WBAT     Mobility  Bed Mobility Overal bed mobility: Needs Assistance Bed Mobility: Sit to Supine      Sit to supine: Min assist   General bed mobility comments: Assist for R LE.    Transfers Overall transfer level: Needs assistance Equipment used: Rolling walker (2 wheeled) Transfers: Sit to/from Stand Sit to Stand: Min assist         General transfer comment: Assist to rise, steady, control descent. Cues for safety, hand/LE placement.  Ambulation/Gait Ambulation/Gait assistance: Min assist Gait Distance (Feet): 15 Feet (x2) Assistive device: Rolling walker (2 wheeled) Gait Pattern/deviations:  Step-to pattern;Antalgic;Trunk flexed     General Gait Details: Min A to steady throughout distance. Ambulation distance limited by pain.   Stairs             Wheelchair Mobility    Modified Rankin (Stroke Patients Only)       Balance Overall balance assessment: Needs assistance         Standing balance support: Bilateral upper extremity supported Standing balance-Leahy Scale: Poor                              Cognition Arousal/Alertness: Awake/alert Behavior During Therapy: WFL for tasks assessed/performed Overall Cognitive Status: Within Functional Limits for tasks assessed                                        Exercises Total Joint Exercises Ankle Circles/Pumps: AROM;Both;10 reps Quad Sets: AROM;Both;10 reps Heel Slides: AAROM;Right;5 reps Hip ABduction/ADduction: AAROM;Right;5 reps Straight Leg Raises: AAROM;Right;5 reps Goniometric ROM: ~10-45 degrees (limited by pain)    General Comments        Pertinent Vitals/Pain Pain Assessment: Faces Pain Score: 10-Worst pain ever Faces Pain Scale: Hurts whole lot Pain Location: R knee Pain Descriptors / Indicators: Grimacing;Guarding;Discomfort;Sore;Sharp Pain Intervention(s): Limited activity within patient's tolerance;Monitored during session;Repositioned    Home Living                      Prior Function            PT  Goals (current goals can now be found in the care plan section) Progress towards PT goals: Progressing toward goals    Frequency    7X/week      PT Plan Current plan remains appropriate    Co-evaluation              AM-PAC PT "6 Clicks" Mobility   Outcome Measure  Help needed turning from your back to your side while in a flat bed without using bedrails?: A Little Help needed moving from lying on your back to sitting on the side of a flat bed without using bedrails?: A Little Help needed moving to and from a bed to a chair  (including a wheelchair)?: A Little Help needed standing up from a chair using your arms (e.g., wheelchair or bedside chair)?: A Little Help needed to walk in hospital room?: A Little Help needed climbing 3-5 steps with a railing? : A Lot 6 Click Score: 17    End of Session Equipment Utilized During Treatment: Gait belt;Right knee immobilizer Activity Tolerance: Patient limited by pain Patient left: in bed;with call bell/phone within reach;with bed alarm set   PT Visit Diagnosis: Other abnormalities of gait and mobility (R26.89);Pain Pain - Right/Left: Right Pain - part of body: Knee     Time: 4573-3448 PT Time Calculation (min) (ACUTE ONLY): 22 min  Charges:  $Gait Training: 8-22 mins $Therapeutic Exercise: 8-22 mins                        Doreatha Massed, PT Acute Rehabilitation  Office: [phone] Pager: [phone]

## 2021-09-14 NOTE — Progress Notes (Signed)
Physical Therapy Treatment Patient Details Name: Summer Jackson MRN: [id number] DOB: 1946-10-29 Today's Date: 09/14/2021   History of Present Illness 75 yo female s/p R TKA 09/13/21. Hx of obesity, DM, back sg, L TKA 2014    PT Comments    Pt not doing as well today. Pain rated 10/10 during session. She was only able to tolerate ambulating ~10 feet. Will continue to follow and progress activity as tolerated.     Recommendations for follow up therapy are one component of a multi-disciplinary discharge planning process, led by the attending physician.  Recommendations may be updated based on patient status, additional functional criteria and insurance authorization.  Follow Up Recommendations  Follow surgeon's recommendation for DC plan and follow-up therapies     Equipment Recommendations       Recommendations for Other Services       Precautions / Restrictions Precautions Precautions: Fall;Knee Required Braces or Orthoses: Knee Immobilizer - Right Knee Immobilizer - Right: Discontinue once straight leg raise with < 10 degree lag Restrictions Weight Bearing Restrictions: No Other Position/Activity Restrictions: WBAT     Mobility  Bed Mobility Overal bed mobility: Needs Assistance Bed Mobility: Supine to Sit     Supine to sit: Min assist;HOB elevated     General bed mobility comments: Assist for R LE.    Transfers Overall transfer level: Needs assistance Equipment used: Rolling walker (2 wheeled) Transfers: Sit to/from Stand Sit to Stand: Min assist         General transfer comment: Assist to rise, steady, control descent. Cues for safety, hand/LE placement.  Ambulation/Gait Ambulation/Gait assistance: Min assist Gait Distance (Feet): 15 Feet Assistive device: Rolling walker (2 wheeled) Gait Pattern/deviations: Step-to pattern;Antalgic;Trunk flexed     General Gait Details: Min A to steady throughout distance. Ambulation distance limited by pain.  Recliner used to transport pt back to room.   Stairs             Wheelchair Mobility    Modified Rankin (Stroke Patients Only)       Balance Overall balance assessment: Needs assistance         Standing balance support: Bilateral upper extremity supported Standing balance-Leahy Scale: Poor                              Cognition Arousal/Alertness: Awake/alert Behavior During Therapy: WFL for tasks assessed/performed Overall Cognitive Status: Within Functional Limits for tasks assessed                                        Exercises Total Joint Exercises Ankle Circles/Pumps: AROM;Both;10 reps Quad Sets: AROM;Both;10 reps Heel Slides: AAROM;Right;5 reps Hip ABduction/ADduction: AAROM;Right;5 reps Straight Leg Raises: AAROM;Right;5 reps Goniometric ROM: ~10-45 degrees (limited by pain)    General Comments        Pertinent Vitals/Pain Pain Assessment: 0-10 Pain Score: 10-Worst pain ever Pain Location: R knee Pain Descriptors / Indicators: Grimacing;Guarding;Discomfort;Sore;Sharp Pain Intervention(s): Limited activity within patient's tolerance;Monitored during session;Ice applied;Repositioned    Home Living                      Prior Function            PT Goals (current goals can now be found in the care plan section) Progress towards PT goals: Progressing toward goals    Frequency  7X/week      PT Plan Current plan remains appropriate    Co-evaluation              AM-PAC PT "6 Clicks" Mobility   Outcome Measure  Help needed turning from your back to your side while in a flat bed without using bedrails?: A Little Help needed moving from lying on your back to sitting on the side of a flat bed without using bedrails?: A Little Help needed moving to and from a bed to a chair (including a wheelchair)?: A Little Help needed standing up from a chair using your arms (e.g., wheelchair or bedside  chair)?: A Little Help needed to walk in hospital room?: A Little Help needed climbing 3-5 steps with a railing? : A Lot 6 Click Score: 17    End of Session Equipment Utilized During Treatment: Gait belt;Right knee immobilizer Activity Tolerance: Patient limited by pain Patient left: in chair;with call bell/phone within reach;with chair alarm set   PT Visit Diagnosis: Other abnormalities of gait and mobility (R26.89) Pain - Right/Left: Right Pain - part of body: Knee     Time: 9295-7473 PT Time Calculation (min) (ACUTE ONLY): 24 min  Charges:  $Gait Training: 8-22 mins $Therapeutic Exercise: 8-22 mins                        Doreatha Massed, PT Acute Rehabilitation  Office: [phone] Pager: [phone]

## 2021-09-14 NOTE — Progress Notes (Signed)
Subjective: 1 Day Post-Op Procedure(s) (LRB): TOTAL KNEE ARTHROPLASTY (Right) Patient reports pain as moderate.   Patient seen in rounds by Dr. Wynelle Link. Patient is well, and has had no acute complaints or problems other than pain in the right knee. Denies chest pain or SOB. Slightly hesitant about going home today. Voiding without difficulty.  We will continue therapy today, ambulated 40' yesterday.  Objective: Vital signs in last 24 hours: Temp:  [97.4 F (36.3 C)-98.8 F (37.1 C)] 98.8 F (37.1 C) (09/27 0513) Pulse Rate:  [72-100] 91 (09/27 0513) Resp:  [11-18] 16 (09/27 0513) BP: (125-167)/(54-88) 151/79 (09/27 0513) SpO2:  [96 %-100 %] 100 % (09/27 0513)  Intake/Output from previous day:  Intake/Output Summary (Last 24 hours) at 09/14/2021 0720 Last data filed at 09/14/2021 0548 Gross per 24 hour  Intake 3361.25 ml  Output 1550 ml  Net 1811.25 ml     Intake/Output this shift: No intake/output data recorded.  Labs: Recent Labs    09/13/21 0900 09/14/21 0324  HGB 11.2* 11.0*   Recent Labs    09/13/21 0900 09/14/21 0324  WBC 8.4 10.7*  RBC 3.85* 3.82*  HCT 33.7* 33.2*  PLT 215 241   Recent Labs    09/13/21 0900 09/14/21 0324  NA 133* 132*  K 3.9 3.8  CL 101 100  CO2 25 26  BUN 21 13  CREATININE 0.73 0.54  GLUCOSE 137* 130*  CALCIUM 8.9 8.5*   No results for input(s): LABPT, INR in the last 72 hours.  Exam: General - Patient is Alert and Oriented Extremity - Neurologically intact Neurovascular intact Sensation intact distally Dorsiflexion/Plantar flexion intact Dressing - dressing C/D/I Motor Function - intact, moving foot and toes well on exam.   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   Qualifier: Diagnosis of  By: Radene Ou MD, [place] DISEASE, 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   Annotation: 1957 Qualifier: History of  By: Radene Ou MD, [place]     Constipation 05/29/2013   COPD (chronic obstructive pulmonary disease) (HCC)    Exertional shortness of breath    Foreign body sensation in throat 12/16/2016   Gastroesophageal reflux disease with esophagitis 12/16/2016   GERD (gastroesophageal reflux disease)    Headache 03/20/2018   migraine Overview:  Overview:  migraine migraine   Headache(784.0)    migraine    Hemorrhoids    History of stomach ulcers 1990's   Hyperlipemia    HYPERLIPIDEMIA 08/25/2007   Qualifier: Diagnosis of  By: Radene Ou MD, [place]     Hypertension    Hypertension    HYPERTENSION 08/09/2007   Qualifier: Diagnosis of  By: Radene Ou MD, [place]     Idiopathic peripheral autonomic neuropathy 03/27/2019   Left lower quadrant pain 09/05/2018   Left shoulder pain 02/05/2013   Lipoma of abdominal wall 01/23/2012   Migraines    Nasal congestion    Nicotine dependence 11/23/2018   Pharyngeal dysphagia 12/16/2016   Prediabetes 01/10/2013   Rectal bleeding    Scapular dyskinesis 03/26/2018   SHINGLES, RECURRENT 09/30/2008   Qualifier: Diagnosis of  By: Radene Ou MD, [place]     Spondylolisthesis, lumbar region 04/10/2019   Added automatically from request for surgery [id number]   Sternal pain 03/26/2018   Synovial cyst  of lumbar facet joint 04/08/2019   Urinary frequency    Weight loss    Wheezing     Assessment/Plan: 1 Day Post-Op Procedure(s) (LRB): TOTAL KNEE ARTHROPLASTY (Right) Principal Problem:   OA (osteoarthritis) of knee Active Problems:   Primary osteoarthritis of right knee  Estimated body mass index is 32.5 kg/m as calculated from the following:   Height as of this encounter: 5\' 1"  (1.549 m).   Weight as of this encounter: 78 kg. Advance diet Up with therapy D/C IV fluids   Patient's anticipated LOS is less than 2 midnights, meeting these requirements: - Lives within 1 hour of care - Has a  competent adult at home to recover with post-op recover - NO history of  - Chronic pain requiring opiodis  - Diabetes  - Coronary Artery Disease  - Heart failure  - Heart attack  - Stroke  - DVT/VTE  - Cardiac arrhythmia  - Respiratory Failure/COPD  - Renal failure  - Anemia  - Advanced Liver disease  DVT Prophylaxis - Aspirin Weight bearing as tolerated. Continue therapy.  Plan is to go Home after hospital stay. Possible discharge this afternoon if meeting goals with physical therapy.  Scheduled for OPPT at [facility]). Follow-up in the office in 2 weeks  The PDMP database was reviewed today prior to any opioid medications being prescribed to this patient.  Theresa Duty, PA-C Orthopedic Surgery [phone] 09/14/2021, 7:20 AM

## 2021-09-14 NOTE — Progress Notes (Signed)
Patient did not progress with PT, K Edmisten PA notified D Rosine Abe

## 2021-09-15 DIAGNOSIS — M1711 Unilateral primary osteoarthritis, right knee: Secondary | ICD-10-CM | POA: Diagnosis not present

## 2021-09-15 LAB — BASIC METABOLIC PANEL
Anion gap: 11 (ref 5–15)
BUN: 12 mg/dL (ref 8–23)
CO2: 24 mmol/L (ref 22–32)
Calcium: 8.6 mg/dL — ABNORMAL LOW (ref 8.9–10.3)
Chloride: 97 mmol/L — ABNORMAL LOW (ref 98–111)
Creatinine, Ser: 0.6 mg/dL (ref 0.44–1.00)
GFR, Estimated: >60 mL/min (ref >60)
Glucose, Bld: 115 mg/dL — ABNORMAL HIGH (ref 70–99)
Potassium: 3.9 mmol/L (ref 3.5–5.1)
Sodium: 132 mmol/L — ABNORMAL LOW (ref 135–145)

## 2021-09-15 LAB — CBC
HCT: 32.2 % — ABNORMAL LOW (ref 36.0–46.0)
Hemoglobin: 10.4 g/dL — ABNORMAL LOW (ref 12.0–15.0)
MCH: 29 pg (ref 26.0–34.0)
MCHC: 32.3 g/dL (ref 30.0–36.0)
MCV: 89.7 fL (ref 80.0–100.0)
Platelets: 236 10*3/uL (ref 150–400)
RBC: 3.59 MIL/uL — ABNORMAL LOW (ref 3.87–5.11)
RDW: 14.6 % (ref 11.5–15.5)
WBC: 10.9 10*3/uL — ABNORMAL HIGH (ref 4.0–10.5)
nRBC: 0 % (ref 0.0–0.2)

## 2021-09-15 MED ORDER — HYDROCHLOROTHIAZIDE 12.5 MG PO CAPS
12.5000 mg | ORAL_CAPSULE | Freq: Every day | ORAL | Status: DC
  Administered 2021-09-15: 12.5 mg via ORAL
  Filled 2021-09-15: qty 1

## 2021-09-15 MED ORDER — BISACODYL 10 MG RE SUPP: 10.0000 mg | Freq: Once | RECTAL | Status: DC

## 2021-09-15 NOTE — Progress Notes (Signed)
Physical Therapy Treatment Patient Details Name: Summer Jackson MRN: [id number] DOB: Dec 13, 1946 Today's Date: 09/15/2021   History of Present Illness 75 yo female s/p R TKA 09/13/21. Hx of obesity, DM, back sg, L TKA 2014    PT Comments    Progressing with mobility. Will plan to have a 2nd session prior to potential d/c home later today.    Recommendations for follow up therapy are one component of a multi-disciplinary discharge planning process, led by the attending physician.  Recommendations may be updated based on patient status, additional functional criteria and insurance authorization.  Follow Up Recommendations  Follow surgeon's recommendation for DC plan and follow-up therapies     Equipment Recommendations       Recommendations for Other Services       Precautions / Restrictions Precautions Precautions: Fall;Knee Required Braces or Orthoses: Knee Immobilizer - Right Knee Immobilizer - Right: Discontinue once straight leg raise with < 10 degree lag Restrictions Weight Bearing Restrictions: No Other Position/Activity Restrictions: WBAT     Mobility  Bed Mobility Overal bed mobility: Needs Assistance Bed Mobility: Supine to Sit;Sit to Supine     Supine to sit: Min assist;HOB elevated Sit to supine: Min assist;HOB elevated   General bed mobility comments: Assist for R LE.    Transfers Overall transfer level: Needs assistance Equipment used: Rolling walker (2 wheeled) Transfers: Sit to/from Stand Sit to Stand: Min guard         General transfer comment: Min guard for safety. Cues for safety, hand/LE placement  Ambulation/Gait Ambulation/Gait assistance: Min guard Gait Distance (Feet): 40 Feet Assistive device: Rolling walker (2 wheeled) Gait Pattern/deviations: Step-to pattern;Antalgic;Trunk flexed     General Gait Details: Min guard for safety. Slow gait speed.  Ambulation distance limited by pain.   Stairs             Wheelchair  Mobility    Modified Rankin (Stroke Patients Only)       Balance Overall balance assessment: Needs assistance         Standing balance support: Bilateral upper extremity supported Standing balance-Leahy Scale: Poor                              Cognition Arousal/Alertness: Awake/alert Behavior During Therapy: WFL for tasks assessed/performed Overall Cognitive Status: Within Functional Limits for tasks assessed                                        Exercises Total Joint Exercises Ankle Circles/Pumps: AROM;Both;10 reps Quad Sets: AROM;Both;5 reps Heel Slides: AAROM;Right;5 reps Hip ABduction/ADduction: AAROM;Right;5 reps Straight Leg Raises: AAROM;Right;5 reps Goniometric ROM: ~10-45 degrees    General Comments        Pertinent Vitals/Pain Pain Assessment: 0-10 Pain Score: 7  Pain Location: R knee/thigh Pain Descriptors / Indicators: Grimacing;Guarding;Discomfort;Sore;Sharp Pain Intervention(s): Limited activity within patient's tolerance;Monitored during session;Ice applied;Repositioned    Home Living                      Prior Function            PT Goals (current goals can now be found in the care plan section) Progress towards PT goals: Progressing toward goals    Frequency    7X/week      PT Plan Current plan remains appropriate    Co-evaluation  AM-PAC PT "6 Clicks" Mobility   Outcome Measure  Help needed turning from your back to your side while in a flat bed without using bedrails?: A Little Help needed moving from lying on your back to sitting on the side of a flat bed without using bedrails?: A Little Help needed moving to and from a bed to a chair (including a wheelchair)?: A Little Help needed standing up from a chair using your arms (e.g., wheelchair or bedside chair)?: A Little Help needed to walk in hospital room?: A Little Help needed climbing 3-5 steps with a railing? : A  Lot 6 Click Score: 17    End of Session Equipment Utilized During Treatment: Gait belt;Right knee immobilizer Activity Tolerance: Patient limited by pain Patient left: in bed;with call bell/phone within reach;with bed alarm set   PT Visit Diagnosis: Other abnormalities of gait and mobility (R26.89);Pain Pain - Right/Left: Right Pain - part of body: Knee     Time: 1050-1111 PT Time Calculation (min) (ACUTE ONLY): 21 min  Charges:  $Gait Training: 8-22 mins                         Doreatha Massed, PT Acute Rehabilitation  Office: [phone] Pager: [phone]

## 2021-09-15 NOTE — Progress Notes (Signed)
Subjective: 2 Days Post-Op Procedure(s) (LRB): TOTAL KNEE ARTHROPLASTY (Right) Patient reports pain as mild.   Patient seen in rounds by Dr. Wynelle Link. Patient is well, and has had no acute complaints or problems, other than constipation - suppositories given yesterday. Denies SOB, chest pain, or calf pain. No acute overnight events. Ambulated 15 feet with therapy yesterday. Will continue therapy today.   Plan is to go Home after hospital stay.  Objective: Vital signs in last 24 hours: Temp:  [98 F (36.7 C)-98.7 F (37.1 C)] 98.7 F (37.1 C) (09/28 0523) Pulse Rate:  [84-93] 93 (09/28 0523) Resp:  [16-18] 16 (09/28 0523) BP: (143-158)/(80-102) 143/80 (09/28 0523) SpO2:  [95 %-100 %] 95 % (09/28 0745)  Intake/Output from previous day:  Intake/Output Summary (Last 24 hours) at 09/15/2021 0802 Last data filed at 09/15/2021 0600 Gross per 24 hour  Intake 530 ml  Output --  Net 530 ml    Intake/Output this shift: No intake/output data recorded.  Labs: Recent Labs    09/13/21 0900 09/14/21 0324 09/15/21 0323  HGB 11.2* 11.0* 10.4*   Recent Labs    09/14/21 0324 09/15/21 0323  WBC 10.7* 10.9*  RBC 3.82* 3.59*  HCT 33.2* 32.2*  PLT 241 236   Recent Labs    09/14/21 0324 09/15/21 0323  NA 132* 132*  K 3.8 3.9  CL 100 97*  CO2 26 24  BUN 13 12  CREATININE 0.54 0.60  GLUCOSE 130* 115*  CALCIUM 8.5* 8.6*   No results for input(s): LABPT, INR in the last 72 hours.  Exam: General - Patient is Alert and Oriented Extremity - Neurologically intact Neurovascular intact Intact pulses distally Dorsiflexion/Plantar flexion intact Dressing/Incision - clean, dry, no drainage Motor Function - intact, moving foot and toes well on exam.   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    Qualifier: Diagnosis of  By: Radene Ou MD, [place] DISEASE, 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   Annotation: 1957 Qualifier: History of  By: Radene Ou MD, [place]     Constipation 05/29/2013   COPD (chronic obstructive pulmonary disease) (HCC)    Exertional shortness of breath    Foreign body sensation in throat 12/16/2016   Gastroesophageal reflux disease with esophagitis 12/16/2016   GERD (gastroesophageal reflux disease)    Headache 03/20/2018   migraine Overview:  Overview:  migraine migraine   Headache(784.0)    migraine    Hemorrhoids    History of stomach ulcers 1990's   Hyperlipemia    HYPERLIPIDEMIA 08/25/2007   Qualifier: Diagnosis of  By: Radene Ou MD, [place]     Hypertension    Hypertension    HYPERTENSION 08/09/2007   Qualifier: Diagnosis of  By: Radene Ou MD, [place]     Idiopathic peripheral autonomic neuropathy 03/27/2019   Left lower quadrant pain 09/05/2018   Left shoulder pain 02/05/2013   Lipoma of abdominal wall 01/23/2012   Migraines    Nasal congestion    Nicotine dependence 11/23/2018   Pharyngeal dysphagia 12/16/2016   Prediabetes 01/10/2013   Rectal bleeding    Scapular dyskinesis 03/26/2018   SHINGLES, RECURRENT 09/30/2008   Qualifier: Diagnosis of  By: Radene Ou MD, [place]     Spondylolisthesis, lumbar region 04/10/2019   Added automatically from request for  surgery [id number]   Sternal pain 03/26/2018   Synovial cyst of lumbar facet joint 04/08/2019   Urinary frequency    Weight loss    Wheezing     Assessment/Plan: 2 Days Post-Op Procedure(s) (LRB): TOTAL KNEE ARTHROPLASTY (Right) Principal Problem:   OA (osteoarthritis) of knee Active Problems:   Primary osteoarthritis of right knee  Estimated body mass index is 32.5 kg/m as calculated from the following:   Height as of this encounter: 5\' 1"  (1.549 m).   Weight as of this encounter: 78 kg. Up with therapy  DVT Prophylaxis - Aspirin and TED hose Weight-bearing as tolerated  Plan for two sessions  with PT this morning, and if meeting goals, will plan for discharge this afternoon.   Patient to follow up in two weeks with Dr. Wynelle Link in clinic.   The PDMP database was reviewed today prior to any opioid medications being prescribed to this patient.Fenton Foy, MBA, PA-C Orthopedic Surgery 09/15/2021, 8:02 AM

## 2021-09-15 NOTE — Progress Notes (Addendum)
Physical Therapy Treatment Patient Details Name: Summer Jackson MRN: [id number] DOB: October 01, 1946 Today's Date: 09/15/2021   History of Present Illness 75 yo female s/p R TKA 09/13/21. Hx of obesity, DM, back sg, L TKA 2014    PT Comments    Progressing with mobility. Reviewed/practiced gait and stair step training this afternoon. Moderate pain with activity. Ok to d/c from PT standpoint.     Recommendations for follow up therapy are one component of a multi-disciplinary discharge planning process, led by the attending physician.  Recommendations may be updated based on patient status, additional functional criteria and insurance authorization.  Follow Up Recommendations  Follow surgeon's recommendation for DC plan and follow-up therapies     Equipment Recommendations       Recommendations for Other Services       Precautions / Restrictions Precautions Precautions: Fall;Knee Required Braces or Orthoses: Knee Immobilizer - Right Knee Immobilizer - Right: Discontinue once straight leg raise with < 10 degree lag Restrictions Weight Bearing Restrictions: No Other Position/Activity Restrictions: WBAT     Mobility  Bed Mobility Overal bed mobility: Needs Assistance Bed Mobility: Supine to Sit;Sit to Supine     Supine to sit: Min assist;HOB elevated Sit to supine: Min assist;HOB elevated   General bed mobility comments: Assist for R LE.    Transfers Overall transfer level: Needs assistance Equipment used: Rolling walker (2 wheeled) Transfers: Sit to/from Stand Sit to Stand: Min guard         General transfer comment: Min guard for safety. Cues for safety, hand/LE placement  Ambulation/Gait Ambulation/Gait assistance: Min guard Gait Distance (Feet): 40 Feet Assistive device: Rolling walker (2 wheeled) Gait Pattern/deviations: Step-to pattern;Antalgic;Trunk flexed     General Gait Details: Min guard for safety. Slow gait speed.  Ambulation distance limited by  pain.   Stairs Stairs: Yes Stairs assistance: Min assist Stair Management: Step to pattern;With walker;Forwards Number of Stairs: 1 General stair comments: Cues for safety, technique, sequence. Assist to steady pt and manage with RW   Wheelchair Mobility    Modified Rankin (Stroke Patients Only)       Balance Overall balance assessment: Needs assistance         Standing balance support: Bilateral upper extremity supported Standing balance-Leahy Scale: Poor                              Cognition Arousal/Alertness: Awake/alert Behavior During Therapy: WFL for tasks assessed/performed Overall Cognitive Status: Within Functional Limits for tasks assessed                                        Exercises Total Joint Exercises Ankle Circles/Pumps: AROM;Both;10 reps Quad Sets: AROM;Both;5 reps Heel Slides: AAROM;Right;5 reps Hip ABduction/ADduction: AAROM;Right;5 reps Straight Leg Raises: AAROM;Right;5 reps Goniometric ROM: ~10-45 degrees    General Comments        Pertinent Vitals/Pain Pain Assessment: 0-10 Pain Score: 7  Pain Location: R knee/thigh Pain Descriptors / Indicators: Discomfort;Sore Pain Intervention(s): Limited activity within patient's tolerance;Monitored during session;Ice applied;Repositioned    Home Living                      Prior Function            PT Goals (current goals can now be found in the care plan section) Progress towards PT  goals: Progressing toward goals    Frequency    7X/week      PT Plan Current plan remains appropriate    Co-evaluation              AM-PAC PT "6 Clicks" Mobility   Outcome Measure  Help needed turning from your back to your side while in a flat bed without using bedrails?: A Little Help needed moving from lying on your back to sitting on the side of a flat bed without using bedrails?: A Little Help needed moving to and from a bed to a chair  (including a wheelchair)?: A Little Help needed standing up from a chair using your arms (e.g., wheelchair or bedside chair)?: A Little Help needed to walk in hospital room?: A Little Help needed climbing 3-5 steps with a railing? : A Little 6 Click Score: 18    End of Session Equipment Utilized During Treatment: Gait belt;Right knee immobilizer Activity Tolerance: Patient tolerated treatment well Patient left: in bed;with call bell/phone within reach   PT Visit Diagnosis: Other abnormalities of gait and mobility (R26.89);Pain Pain - Right/Left: Right Pain - part of body: Knee     Time: 1415-1436 PT Time Calculation (min) (ACUTE ONLY): 21 min  Charges:  $Gait Training: 8-22 mins                         Doreatha Massed, PT Acute Rehabilitation  Office: [phone] Pager: [phone]

## 2021-09-15 NOTE — Progress Notes (Signed)
AVS given and reviewed with pt. Medications discussed. Rolling walker delivered to bedside. All questions answered to satisfaction. Pt verbalized understanding of information given. Pt to be escorted off the unit with all belongings via wheelchair by staff member.

## 2021-09-28 ENCOUNTER — Telehealth: Payer: Self-pay | Admitting: Cardiovascular Disease

## 2021-09-28 NOTE — Discharge Summary (Signed)
Physician Discharge Summary   Patient ID: Summer Jackson MRN: [id number] DOB/AGE: Sep 25, 1946 75 y.o.  Admit date: 09/13/2021 Discharge date: 09/15/2021  Primary Diagnosis: s/p Right TKA  Admission Diagnoses:  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   Qualifier: Diagnosis of  By: Radene Ou MD, [place] DISEASE, 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   Annotation: 1957 Qualifier: History of  By: Radene Ou MD, [place]     Constipation 05/29/2013   COPD (chronic obstructive pulmonary disease) (HCC)    Exertional shortness of breath    Foreign body sensation in throat 12/16/2016   Gastroesophageal reflux disease with esophagitis 12/16/2016   GERD (gastroesophageal reflux disease)    Headache 03/20/2018   migraine Overview:  Overview:  migraine migraine   Headache(784.0)    migraine    Hemorrhoids    History of stomach ulcers 1990's   Hyperlipemia    HYPERLIPIDEMIA 08/25/2007   Qualifier: Diagnosis of  By: Radene Ou MD, [place]     Hypertension    Hypertension    HYPERTENSION 08/09/2007   Qualifier: Diagnosis of  By: Radene Ou MD, [place]     Idiopathic peripheral autonomic neuropathy 03/27/2019   Left lower quadrant pain 09/05/2018   Left shoulder pain 02/05/2013   Lipoma of abdominal wall 01/23/2012   Migraines    Nasal congestion    Nicotine dependence 11/23/2018   Pharyngeal dysphagia 12/16/2016   Prediabetes 01/10/2013   Rectal bleeding    Scapular dyskinesis 03/26/2018   SHINGLES, RECURRENT 09/30/2008   Qualifier: Diagnosis of  By: Radene Ou MD, [place]     Spondylolisthesis, lumbar region 04/10/2019   Added automatically from request for surgery [phone]   Sternal pain 03/26/2018   Synovial cyst of lumbar facet joint 04/08/2019   Urinary frequency    Weight loss    Wheezing    Discharge Diagnoses:    Principal Problem:   OA (osteoarthritis) of knee Active Problems:   Primary osteoarthritis of right knee  Estimated body mass index is 32.5 kg/m as calculated from the following:   Height as of this encounter: 5' 1"  (1.549 m).   Weight as of this encounter: 78 kg.  Procedure:  Procedure(s) (LRB): TOTAL KNEE ARTHROPLASTY (Right)   Consults: None  HPI: Summer Jackson is a 75 y.o. 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 She has constant pain, with activity and at rest and significant functional deficits with difficulties even with ADLs. She has had extensive non-op management including analgesics, injections of cortisone and viscosupplements, and home exercise program, but remains in significant pain with significant dysfunction.Radiographs show bone on bone arthritis lateral and patellofemoral. She presents now for right Total Knee Arthroplasty.     Laboratory Data: Admission on 09/13/2021, Discharged on 09/15/2021  Component Date Value Ref Range Status   WBC 09/13/2021 8.4  4.0 - 10.5 K/uL Final   RBC 09/13/2021 3.85 (A) 3.87 - 5.11 MIL/uL Final   Hemoglobin 09/13/2021 11.2 (A) 12.0 - 15.0 g/dL Final   HCT 09/13/2021 33.7 (A) 36.0 - 46.0 % Final   MCV 09/13/2021 87.5  80.0 - 100.0 fL Final   MCH 09/13/2021 29.1  26.0 - 34.0 pg Final   MCHC 09/13/2021 33.2  30.0 -  36.0 g/dL Final   RDW 09/13/2021 14.5  11.5 - 15.5 % Final   Platelets 09/13/2021 215  150 - 400 K/uL Final   nRBC 09/13/2021 0.0  0.0 - 0.2 % Final   Performed at [facility], [place] [address], [place], [place] [address]   Sodium 09/13/2021 133 (A) 135 - 145 mmol/L Final   Potassium 09/13/2021 3.9  3.5 - 5.1 mmol/L Final   Chloride 09/13/2021 101  98 - 111 mmol/L Final   CO2 09/13/2021 25  22 - 32 mmol/L Final   Glucose, Bld 09/13/2021 137 (A) 70 - 99 mg/dL Final   Glucose reference range applies only to samples taken after fasting for  at least 8 hours.   BUN 09/13/2021 21  8 - 23 mg/dL Final   Creatinine, Ser 09/13/2021 0.73  0.44 - 1.00 mg/dL Final   Calcium 09/13/2021 8.9  8.9 - 10.3 mg/dL Final   Total Protein 09/13/2021 6.3 (A) 6.5 - 8.1 g/dL Final   Albumin 09/13/2021 3.9  3.5 - 5.0 g/dL Final   AST 09/13/2021 26  15 - 41 U/L Final   ALT 09/13/2021 24  0 - 44 U/L Final   Alkaline Phosphatase 09/13/2021 42  38 - 126 U/L Final   Total Bilirubin 09/13/2021 0.5  0.3 - 1.2 mg/dL Final   GFR, Estimated 09/13/2021 >60  >60 mL/min Final   Comment: (NOTE) Calculated using the CKD-EPI Creatinine Equation (2021)    Anion gap 09/13/2021 7  5 - 15 Final   Performed at [facility], [place] [address] [place], [place] [address]   Glucose-Capillary 09/13/2021 96  70 - 99 mg/dL Final   Glucose reference range applies only to samples taken after fasting for at least 8 hours.   Comment 1 09/13/2021 Notify RN   Final   Comment 2 09/13/2021 Document in Chart   Final   WBC 09/14/2021 10.7 (A) 4.0 - 10.5 K/uL Final   RBC 09/14/2021 3.82 (A) 3.87 - 5.11 MIL/uL Final   Hemoglobin 09/14/2021 11.0 (A) 12.0 - 15.0 g/dL Final   HCT 09/14/2021 33.2 (A) 36.0 - 46.0 % Final   MCV 09/14/2021 86.9  80.0 - 100.0 fL Final   MCH 09/14/2021 28.8  26.0 - 34.0 pg Final   MCHC 09/14/2021 33.1  30.0 - 36.0 g/dL Final   RDW 09/14/2021 14.5  11.5 - 15.5 % Final   Platelets 09/14/2021 241  150 - 400 K/uL Final   nRBC 09/14/2021 0.0  0.0 - 0.2 % Final   Performed at [facility], [place] [address] [place], [place] [address]   Sodium 09/14/2021 132 (A) 135 - 145 mmol/L Final   Potassium 09/14/2021 3.8  3.5 - 5.1 mmol/L Final   Chloride 09/14/2021 100  98 - 111 mmol/L Final   CO2 09/14/2021 26  22 - 32 mmol/L Final   Glucose, Bld 09/14/2021 130 (A) 70 - 99 mg/dL Final   Glucose reference range applies only to samples taken after fasting for at least 8 hours.   BUN 09/14/2021 13  8 - 23 mg/dL Final   Creatinine, Ser  09/14/2021 0.54  0.44 - 1.00 mg/dL Final   Calcium 09/14/2021 8.5 (A) 8.9 - 10.3 mg/dL Final   GFR, Estimated 09/14/2021 >60  >60 mL/min Final   Comment: (NOTE) Calculated using the CKD-EPI Creatinine Equation (2021)    Anion gap 09/14/2021 6  5 - 15 Final   Performed at [facility], [address] [place], [place] [address]   WBC 09/15/2021 10.9 (A) 4.0 - 10.5 K/uL Final   RBC 09/15/2021 3.59 (A) 3.87 - 5.11 MIL/uL Final   Hemoglobin 09/15/2021 10.4 (A) 12.0 - 15.0 g/dL Final   HCT 09/15/2021 32.2 (A) 36.0 - 46.0 % Final   MCV 09/15/2021 89.7  80.0 - 100.0 fL Final   MCH 09/15/2021 29.0  26.0 - 34.0 pg Final   MCHC 09/15/2021 32.3  30.0 - 36.0 g/dL Final   RDW 09/15/2021 14.6  11.5 - 15.5 % Final   Platelets 09/15/2021 236  150 - 400 K/uL Final   nRBC 09/15/2021 0.0  0.0 - 0.2 % Final   Performed at [facility], [place] [address], [place], [place] [address]   Sodium 09/15/2021 132 (A) 135 - 145 mmol/L Final   Potassium 09/15/2021 3.9  3.5 - 5.1 mmol/L Final   Chloride 09/15/2021 97 (A) 98 - 111 mmol/L Final   CO2 09/15/2021 24  22 - 32 mmol/L Final   Glucose, Bld 09/15/2021 115 (A) 70 - 99 mg/dL Final   Glucose reference range applies only to samples taken after fasting for at least 8 hours.   BUN 09/15/2021 12  8 - 23 mg/dL Final   Creatinine, Ser 09/15/2021 0.60  0.44 - 1.00 mg/dL Final   Calcium 09/15/2021 8.6 (A) 8.9 - 10.3 mg/dL Final   GFR, Estimated 09/15/2021 >60  >60 mL/min Final   Comment: (NOTE) Calculated using the CKD-EPI Creatinine Equation (2021)    Anion gap 09/15/2021 11  5 - 15 Final   Performed at [facility], [place] [address], [place], [place] [address]  Orders Only on 09/10/2021  Component Date Value Ref Range Status   SARS Coronavirus 2 09/10/2021 RESULT: NEGATIVE   Final   Comment: RESULT: NEGATIVESARS-CoV-2 INTERPRETATION:A NEGATIVE  test result means that SARS-CoV-2 RNA was not present in the  specimen above the limit of detection of this test. This does not preclude a possible SARS-CoV-2 infection and should not be used as the  sole basis for patient management decisions. Negative results must be combined with clinical observations, patient history, and epidemiological information. Optimum specimen types and timing for peak viral levels during infections caused by SARS-CoV-2  have not been determined. Collection of multiple specimens or types of specimens may be necessary to detect virus. Improper specimen collection and handling, sequence variability under primers/probes, or organism present below the limit of detection may  lead to false negative results. Positive and negative predictive values of testing are highly dependent on prevalence. False negative test results are more likely when prevalence of disease is high.The expected result is NEGATIVE.Fact S                          heet for  Healthcare Providers: LocalChronicle.no Sheet for Patients: SalonLookup.es Reference Range - Negative   Hospital Outpatient Visit on 08/26/2021  Component Date Value Ref Range Status   Prothrombin Time 08/26/2021 12.2  11.4 - 15.2 seconds Final   INR 08/26/2021 0.9  0.8 - 1.2 Final   Comment: (NOTE) INR goal varies based on device and disease states. Performed at [facility], [place] [address], [place], [place] [address]    Hgb A1c MFr Bld 08/26/2021 6.0 (A) 4.8 - 5.6 % Final   Comment: (NOTE) Pre diabetes:          5.7%-6.4%  Diabetes:              >  6.4%  Glycemic control for   <7.0% adults with diabetes    Mean Plasma Glucose 08/26/2021 125.5  mg/dL Final   Performed at [place] [address], [place], [place] [address]   MRSA, PCR 08/26/2021 NEGATIVE  NEGATIVE Final   Staphylococcus aureus 08/26/2021 NEGATIVE  NEGATIVE Final   Comment: (NOTE) The Xpert SA Assay (FDA approved for NASAL specimens  in patients 36 years of age and older), is one component of a comprehensive surveillance program. It is not intended to diagnose infection nor to guide or monitor treatment. Performed at [facility], [place] [address], [place], [place] [address]    Glucose-Capillary 08/26/2021 82  70 - 99 mg/dL Final   Glucose reference range applies only to samples taken after fasting for at least 8 hours.  Office Visit on 08/25/2021  Component Date Value Ref Range Status   Glucose 08/25/2021 83  65 - 99 mg/dL Final   BUN 08/25/2021 19  8 - 27 mg/dL Final   Creatinine, Ser 08/25/2021 0.71  0.57 - 1.00 mg/dL Final   eGFR 08/25/2021 90  >59 mL/min/1.73 Final   BUN/Creatinine Ratio 08/25/2021 27  12 - 28 Final   Sodium 08/25/2021 138  134 - 144 mmol/L Final   Potassium 08/25/2021 4.6  3.5 - 5.2 mmol/L Final   Chloride 08/25/2021 100  96 - 106 mmol/L Final   CO2 08/25/2021 23  20 - 29 mmol/L Final   Calcium 08/25/2021 9.5  8.7 - 10.3 mg/dL Final  Admission on 08/17/2021, Discharged on 08/17/2021  Component Date Value Ref Range Status   Sodium 08/17/2021 133 (A) 135 - 145 mmol/L Final   Potassium 08/17/2021 4.1  3.5 - 5.1 mmol/L Final   Chloride 08/17/2021 99  98 - 111 mmol/L Final   CO2 08/17/2021 25  22 - 32 mmol/L Final   Glucose, Bld 08/17/2021 108 (A) 70 - 99 mg/dL Final   Glucose reference range applies only to samples taken after fasting for at least 8 hours.   BUN 08/17/2021 40 (A) 8 - 23 mg/dL Final   Creatinine, Ser 08/17/2021 0.73  0.44 - 1.00 mg/dL Final   Calcium 08/17/2021 9.0  8.9 - 10.3 mg/dL Final   Total Protein 08/17/2021 6.2 (A) 6.5 - 8.1 g/dL Final   Albumin 08/17/2021 3.7  3.5 - 5.0 g/dL Final   AST 08/17/2021 27  15 - 41 U/L Final   ALT 08/17/2021 30  0 - 44 U/L Final   Alkaline Phosphatase 08/17/2021 41  38 - 126 U/L Final   Total Bilirubin 08/17/2021 0.4  0.3 - 1.2 mg/dL Final   GFR, Estimated 08/17/2021 >60  >60 mL/min Final   Comment: (NOTE) Calculated  using the CKD-EPI Creatinine Equation (2021)    Anion gap 08/17/2021 9  5 - 15 Final   Performed at [facility], [place] [place], [place] [address]   WBC 08/17/2021 7.6  4.0 - 10.5 K/uL Final   RBC 08/17/2021 4.01  3.87 - 5.11 MIL/uL Final   Hemoglobin 08/17/2021 11.5 (A) 12.0 - 15.0 g/dL Final   HCT 08/17/2021 35.0 (A) 36.0 - 46.0 % Final   MCV 08/17/2021 87.3  80.0 - 100.0 fL Final   MCH 08/17/2021 28.7  26.0 - 34.0 pg Final   MCHC 08/17/2021 32.9  30.0 - 36.0 g/dL Final   RDW 08/17/2021 15.4  11.5 - 15.5 % Final   Platelets 08/17/2021 219  150 - 400 K/uL Final  nRBC 08/17/2021 0.0  0.0 - 0.2 % Final   Neutrophils Relative % 08/17/2021 72  % Final   Neutro Abs 08/17/2021 5.5  1.7 - 7.7 K/uL Final   Lymphocytes Relative 08/17/2021 18  % Final   Lymphs Abs 08/17/2021 1.4  0.7 - 4.0 K/uL Final   Monocytes Relative 08/17/2021 8  % Final   Monocytes Absolute 08/17/2021 0.6  0.1 - 1.0 K/uL Final   Eosinophils Relative 08/17/2021 1  % Final   Eosinophils Absolute 08/17/2021 0.1  0.0 - 0.5 K/uL Final   Basophils Relative 08/17/2021 0  % Final   Basophils Absolute 08/17/2021 0.0  0.0 - 0.1 K/uL Final   Immature Granulocytes 08/17/2021 1  % Final   Abs Immature Granulocytes 08/17/2021 0.04  0.00 - 0.07 K/uL Final   Performed at [facility], [place] [place], [place] [address]   Lipase 08/17/2021 44  11 - 51 U/L Final   Performed at [facility], [address] [place], [place] [address]   Color, Urine 08/17/2021 YELLOW  YELLOW Final   APPearance 08/17/2021 CLEAR  CLEAR Final   Specific Gravity, Urine 08/17/2021 1.015  1.005 - 1.030 Final   pH 08/17/2021 7.0  5.0 - 8.0 Final   Glucose, UA 08/17/2021 NEGATIVE  NEGATIVE mg/dL Final   Hgb urine dipstick 08/17/2021 MODERATE (A) NEGATIVE Final   Bilirubin Urine 08/17/2021 NEGATIVE  NEGATIVE Final   Ketones, ur 08/17/2021 NEGATIVE  NEGATIVE mg/dL Final   Protein, ur 08/17/2021 NEGATIVE   NEGATIVE mg/dL Final   Nitrite 08/17/2021 NEGATIVE  NEGATIVE Final   Leukocytes,Ua 08/17/2021 NEGATIVE  NEGATIVE Final   Performed at [facility], [place] [place], [place] [address]   Troponin I (High Sensitivity) 08/17/2021 4  <18 ng/L Final   Comment: (NOTE) Elevated high sensitivity troponin I (hsTnI) values and significant  changes across serial measurements may suggest ACS but many other  chronic and acute conditions are known to elevate hsTnI results.  Refer to the "Links" section for chest pain algorithms and additional  guidance. Performed at [facility], [place] [place], [place] [address]    RBC / HPF 08/17/2021 11-20  0 - 5 RBC/hpf Final   WBC, UA 08/17/2021 NONE SEEN  0 - 5 WBC/hpf Final   Bacteria, UA 08/17/2021 RARE (A) NONE SEEN Final   Squamous Epithelial / LPF 08/17/2021 0-5  0 - 5 Final   Performed at [facility], [address], [place], [place] [address]     X-Rays:No results found.  EKG: Orders placed or performed in visit on 08/25/21   EKG 12-Lead     Hospital Course: Summer Jackson is a 75 y.o. who was admitted to [facility]. They were brought to the operating room on 09/13/2021 and underwent Procedure(s): TOTAL KNEE ARTHROPLASTY.  Patient tolerated the procedure well and was later transferred to the recovery room and then to the orthopaedic floor for postoperative care. They were given PO and IV analgesics for pain control following their surgery. They were given 24 hours of postoperative antibiotics of  Anti-infectives (From admission, onward)    Start     Dose/Rate Route Frequency Ordered Stop   09/13/21 1400  ceFAZolin (ANCEF) IVPB 2g/100 mL premix        2 g 200 mL/hr over 30 Minutes Intravenous Every 6 hours 09/13/21 1057 09/13/21 2018   09/13/21 0600  ceFAZolin (  ANCEF) IVPB 2g/100 mL premix        2 g 200 mL/hr over 30 Minutes Intravenous On call to O.R. 09/13/21 0973 09/13/21 0727       and started on DVT prophylaxis in the form of Aspirin and TED hose.   PT and OT were ordered for total joint protocol. Discharge planning consulted to help with postop disposition and equipment needs.  Patient had an uneventful night on the evening of surgery. They started to get up OOB with therapy on POD#1.  She worked with therapy on POD #1 and was not meeting  goals. She worked with therapy on POD #1 and was meeting goals. Pt was discharged to home later that day in stable condition.  Diet: Regular diet Activity: WBAT Follow-up: in 2 weeks Disposition: Home Discharged Condition: good   Discharge Instructions     Call MD / Call 911   Complete by: As directed    If you experience chest pain or shortness of breath, CALL 911 and be transported to the hospital emergency room.  If you develope a fever above 101 F, pus (white drainage) or increased drainage or redness at the wound, or calf pain, call your surgeon's office.   Call MD / Call 911   Complete by: As directed    If you experience chest pain or shortness of breath, CALL 911 and be transported to the hospital emergency room.  If you develope a fever above 101 F, pus (white drainage) or increased drainage or redness at the wound, or calf pain, call your surgeon's office.   Change dressing   Complete by: As directed    You may remove the bulky bandage (ACE wrap and gauze) two days after surgery. You will have an adhesive waterproof bandage underneath. Leave this in place until your first follow-up appointment.   Change dressing   Complete by: As directed    You may remove the bulky bandage (ACE wrap and gauze) two days after surgery. You will have an adhesive waterproof bandage underneath. Leave this in place until your first follow-up appointment.   Constipation Prevention   Complete by: As directed    Drink plenty of fluids.  Prune juice may be helpful.  You may use a stool softener, such as Colace (over the counter) 100 mg twice a  day.  Use MiraLax (over the counter) for constipation as needed.   Constipation Prevention   Complete by: As directed    Drink plenty of fluids.  Prune juice may be helpful.  You may use a stool softener, such as Colace (over the counter) 100 mg twice a day.  Use MiraLax (over the counter) for constipation as needed.   Diet - low sodium heart healthy   Complete by: As directed    Diet - low sodium heart healthy   Complete by: As directed    Do not put a pillow under the knee. Place it under the heel.   Complete by: As directed    Do not put a pillow under the knee. Place it under the heel.   Complete by: As directed    Driving restrictions   Complete by: As directed    No driving for two weeks   Driving restrictions   Complete by: As directed    No driving for two weeks   Post-operative opioid taper instructions:   Complete by: As directed    POST-OPERATIVE OPIOID TAPER INSTRUCTIONS: It is important to wean off of your opioid  medication as soon as possible. If you do not need pain medication after your surgery it is ok to stop day one. Opioids include: Codeine, Hydrocodone(Norco, Vicodin), Oxycodone(Percocet, oxycontin) and hydromorphone amongst others.  Long term and even short term use of opiods can cause: Increased pain response Dependence Constipation Depression Respiratory depression And more.  Withdrawal symptoms can include Flu like symptoms Nausea, vomiting And more Techniques to manage these symptoms Hydrate well Eat regular healthy meals Stay active Use relaxation techniques(deep breathing, meditating, yoga) Do Not substitute Alcohol to help with tapering If you have been on opioids for less than two weeks and do not have pain than it is ok to stop all together.  Plan to wean off of opioids This plan should start within one week post op of your joint replacement. Maintain the same interval or time between taking each dose and first decrease the dose.  Cut the  total daily intake of opioids by one tablet each day Next start to increase the time between doses. The last dose that should be eliminated is the evening dose.      Post-operative opioid taper instructions:   Complete by: As directed    POST-OPERATIVE OPIOID TAPER INSTRUCTIONS: It is important to wean off of your opioid medication as soon as possible. If you do not need pain medication after your surgery it is ok to stop day one. Opioids include: Codeine, Hydrocodone(Norco, Vicodin), Oxycodone(Percocet, oxycontin) and hydromorphone amongst others.  Long term and even short term use of opiods can cause: Increased pain response Dependence Constipation Depression Respiratory depression And more.  Withdrawal symptoms can include Flu like symptoms Nausea, vomiting And more Techniques to manage these symptoms Hydrate well Eat regular healthy meals Stay active Use relaxation techniques(deep breathing, meditating, yoga) Do Not substitute Alcohol to help with tapering If you have been on opioids for less than two weeks and do not have pain than it is ok to stop all together.  Plan to wean off of opioids This plan should start within one week post op of your joint replacement. Maintain the same interval or time between taking each dose and first decrease the dose.  Cut the total daily intake of opioids by one tablet each day Next start to increase the time between doses. The last dose that should be eliminated is the evening dose.      TED hose   Complete by: As directed    Use stockings (TED hose) for three weeks on both leg(s).  You may remove them at night for sleeping.   TED hose   Complete by: As directed    Use stockings (TED hose) for three weeks on both leg(s).  You may remove them at night for sleeping.   Weight bearing as tolerated   Complete by: As directed    Weight bearing as tolerated   Complete by: As directed       Allergies as of 09/15/2021       Reactions    Sulfa Antibiotics Swelling, Rash   Amoxicillin Other (See Comments)   REACTION: headache   Azithromycin Other (See Comments)   REACTION: reddness around mouth   Clindamycin/lincomycin Hives   Erythromycin Hives   Penicillins Other (See Comments)   REACTION: headache Has patient had a PCN reaction causing immediate rash, facial/tongue/throat swelling, SOB or lightheadedness with hypotension: No Has patient had a PCN reaction causing severe rash involving mucus membranes or skin necrosis: No Has patient had a PCN reaction that required hospitalization  No Has patient had a PCN reaction occurring within the last 10 years: No If all of the above answers are NO, then may proceed with Cephalosporin use. Tolerated Cephalosporin Date: 09/14/21.   Propoxyphene N-acetaminophen Other (See Comments)   REACTION: mouth sores   Clarithromycin Rash   REACTION: skin rash with minimal vesicular formation on arms and hands        Medication List     TAKE these medications    albuterol (2.5 MG/3ML) 0.083% nebulizer solution Commonly known as: PROVENTIL Take 3 mLs (2.5 mg total) by nebulization every 6 (six) hours as needed for wheezing or shortness of breath. What changed: Another medication with the same name was changed. Make sure you understand how and when to take each.   albuterol 108 (90 Base) MCG/ACT inhaler Commonly known as: VENTOLIN HFA Can inhale two puffs every four to six hours as needed for cough or wheeze. What changed:  how much to take how to take this when to take this reasons to take this additional instructions   aspirin 325 MG EC tablet Take 1 tablet (325 mg total) by mouth 2 (two) times daily for 20 days. Then resume one 81 mg aspirin once a day. What changed:  medication strength how much to take when to take this additional instructions   [employer] D3 50 MCG (2000 UT) Caps Generic drug: Cholecalciferol Take 2,000 Units by mouth daily.   fluticasone 50 MCG/ACT  nasal spray Commonly known as: FLONASE Place 1-2 sprays into both nostrils daily as needed for allergies.   folic acid 097 MCG tablet Commonly known as: FOLVITE Take 400 mcg by mouth daily.   gabapentin 300 MG capsule Commonly known as: NEURONTIN Take a 300 mg capsule three times a day for two weeks following surgery.Then take a 300 mg capsule two times a day for two weeks. Then take a 300 mg capsule once a day for two weeks. Then discontinue.   lisinopril-hydrochlorothiazide 20-12.5 MG tablet Commonly known as: ZESTORETIC Take 1 tablet by mouth daily.   methocarbamol 500 MG tablet Commonly known as: ROBAXIN Take 1 tablet (500 mg total) by mouth every 6 (six) hours as needed for muscle spasms.   omeprazole 40 MG capsule Commonly known as: PRILOSEC Take 40 mg by mouth daily.   oxyCODONE 5 MG immediate release tablet Commonly known as: Oxy IR/ROXICODONE Take 1-2 tablets (5-10 mg total) by mouth every 6 (six) hours as needed for severe pain. Not to exceed 6 tablets a day.   pyridOXINE 100 MG tablet Commonly known as: VITAMIN B-6 Take 100 mg by mouth daily.   simvastatin 20 MG tablet Commonly known as: ZOCOR Take 20 mg by mouth at bedtime.   sucralfate 1 g tablet Commonly known as: CARAFATE Take 1 g by mouth 3 (three) times daily.   traMADol 50 MG tablet Commonly known as: ULTRAM Take 1-2 tablets (50-100 mg total) by mouth every 6 (six) hours as needed for moderate pain.   vitamin B-12 500 MCG tablet Commonly known as: CYANOCOBALAMIN Take 500 mcg by mouth daily.   zinc gluconate 50 MG tablet Take 50 mg by mouth daily.               Discharge Care Instructions  (From admission, onward)           Start     Ordered   09/15/21 0000  Weight bearing as tolerated        09/15/21 0804   09/15/21 0000  Change  dressing       Comments: You may remove the bulky bandage (ACE wrap and gauze) two days after surgery. You will have an adhesive waterproof bandage  underneath. Leave this in place until your first follow-up appointment.   09/15/21 0804   09/14/21 0000  Weight bearing as tolerated        09/14/21 0736   09/14/21 0000  Change dressing       Comments: You may remove the bulky bandage (ACE wrap and gauze) two days after surgery. You will have an adhesive waterproof bandage underneath. Leave this in place until your first follow-up appointment.   09/14/21 0736            Follow-up Information     Gaynelle Arabian, MD. Schedule an appointment as soon as possible for a visit on 09/28/2021.   Specialty: Orthopedic Surgery Contact information: [address] [place] [address] [phone]                 Signed: Fenton Foy, MBA, PA-C Orthopedic Surgery 09/28/2021, 10:26 AM

## 2021-09-28 NOTE — Telephone Encounter (Signed)
Patient came into office and stated that she needed another fluid pill, the one she is taking isn't working. She just had knee surgery and has been retaining fluid since.

## 2021-10-05 ENCOUNTER — Telehealth: Payer: Self-pay | Admitting: Cardiovascular Disease

## 2021-10-05 NOTE — Telephone Encounter (Signed)
Pt c/o swelling: STAT is pt has developed SOB within 24 hours  How much weight have you gained and in what time span? PER PT, SHE HAVE NOT GAINED ANY WEIGHT  If swelling, where is the swelling located?  BILATERAL LEGS/FEET/ANKLES  Are you currently taking a fluid pill? YES BUT IT DOESN'T HELP AT ALL  Are you currently SOB? NO  Do you have a log of your daily weights (if so, list)? NO PT DOES NOT KEEP A DAILY LOG  Have you gained 3 pounds in a day or 5 pounds in a week?   Have you traveled recently?  PT HAVE NEVER TRAVELED

## 2021-10-05 NOTE — Telephone Encounter (Signed)
Spoke to the patient. She stated that she had knee surgery on 9/26. She has developed bilateral lower extremity edema. Per the patient, the surgeon stated that she needs to call her cardiologist to be seen.   She stated that she was instructed to double up on her diuretic. She normally takes 20 mg once daily. She has been advised that there is not a diuretic listed on her medication sheet. The only one listed was lisinopril-HCTZ. She was asked to check her medications to see what she was on and she stated that she could not at the moment.  She does not weigh daily and denies shortness of breath out of her normal. There is no discoloration or temperature change to her legs.   She was offered an APP appointment for next Tuesday but declined. She stated she had to be seen this week.

## 2021-10-06 MED ORDER — FUROSEMIDE 20 MG PO TABS: ORAL_TABLET | ORAL | 0 refills | Status: DC

## 2021-10-06 MED ORDER — LISINOPRIL 20 MG PO TABS: 20.0000 mg | ORAL_TABLET | Freq: Every day | ORAL | 1 refills | Status: DC

## 2021-10-06 NOTE — Telephone Encounter (Signed)
Called patient, advised of message from MD.  RX updated- and sent to pharmacy.  Patient states she can not wear compression stockings, this causes her issues in her legs.

## 2021-10-14 ENCOUNTER — Telehealth: Payer: Self-pay | Admitting: Cardiovascular Disease

## 2021-10-14 MED ORDER — FUROSEMIDE 20 MG PO TABS: 20.0000 mg | ORAL_TABLET | Freq: Every day | ORAL | 1 refills | Status: DC

## 2021-10-14 NOTE — Telephone Encounter (Signed)
Pt is returning call regarding fluid meds

## 2021-10-14 NOTE — Telephone Encounter (Signed)
Returned call to patient of Dr. Audie Box   She has is having bilateral leg/feet edema, R>L  She took lasix 20mg  x3 days per phone note on 10/18 -- she reports this did NOT alleviate swelling.  She would like diuertic to take daily She reports swelling starts not long after she gets up in the morning Swelling has been a recurrent issue since she had surgery on 9/26 She wear compression stockings but then feels like "blood stops in certain areas" and ortho nurse told her to stop wearing She tries to monitor salt/sodium in her diet  She denies weight gain   She reports her BP is OK -- typically runs 112-162O (systolic)  Advised will send a message to Dr. Audie Box and Almyra Free LPN to review/advise

## 2021-10-14 NOTE — Telephone Encounter (Signed)
Spoke with patient and relayed MD advice Rx(s) sent to pharmacy electronically. Will send message to [facility] LPN and scheduling team to assist with appointment

## 2021-10-14 NOTE — Telephone Encounter (Signed)
Called patient, she requested to be seen next week, Dr.O'Neal is not here the following week- was able to get her in on November the 1st at 10:00 AM. Patient verbalized understanding.

## 2021-10-14 NOTE — Telephone Encounter (Signed)
Pt c/o swelling: STAT is pt has developed SOB within 24 hours  If swelling, where is the swelling located? Both feet, worse on right leg the one she had her surgery on.  PT measured her leg right one was 39, left leg was 37  How much weight have you gained and in what time span? no  Have you gained 3 pounds in a day or 5 pounds in a week? no  Do you have a log of your daily weights (if so, list)? no  Are you currently taking a fluid pill? No   Are you currently SOB? no  Have you traveled recently? no   She said the last time this happened they prescribed her a fluid pill for three days.  She states they needs to prescribe her something long than 3 days so she can get rid of the fluid in her leg.

## 2021-10-14 NOTE — Telephone Encounter (Addendum)
No answer LMTCB regarding fluid in leg. Her note said she wants more than a 3-day dose of lasix to decrease the selling in her leg.

## 2021-10-14 NOTE — Telephone Encounter (Signed)
Summer Rile, MD  Fidel Levy, RN Cc: Caprice Beaver, LPN Caller: Unspecified (Today, 11:24 AM) OK to prescribe daily. Get her in for an appointment with me in the next few weeks.   [place] T. Audie Box, MD, [place]  [address], [place]  [place], [place] [address]  [phone]  2:21 PM

## 2021-10-18 NOTE — Progress Notes (Signed)
Cardiology Office Note:   Date:  10/19/2021  NAME:  Summer Jackson    MRN: [id number] DOB:  02-16-1946   PCP:  [facility], [place]  Cardiologist:  Evalina Field, MD  Electrophysiologist:  None   Referring MD: [place]*   Chief Complaint  Patient presents with   Follow-up         History of Present Illness:   Summer BOCCHINO is a 75 y.o. female with a hx of HFpEF, venous insufficiency, HTN, HLD, COPD who presents for follow-up.  She underwent right knee replacement on 09/13/2021.  For the past 2 weeks she has had swelling in her right leg.  She called her office and we switched her to Lasix.  She reports that the right leg is tender to palpation.  It is warm and it is erythematous.  She cannot bear weight on the leg.  She has not had a DVT ultrasound.  She was seen by orthopedics yesterday.  They suspected that there is an infection in the leg.  They do not believe the prosthesis is infected.  She describes no fevers or chills.  She reports she has not having night sweats.  Her weight continues to decline.  I suspect this is from being on Lasix.  I informed her she can take this as needed moving forward.  Symptoms are inconsistent with congestive heart failure.  Concerning findings for infection versus DVT.  Blood pressure slightly elevated today.  I suspect this is related to pain from her leg.  Her weight is down from 172 pounds to 167 pounds.  Problem List 1. HFpEF -negative MPI 11/13/2019 -EF 60-65%, G1DD 2. HTN 3. HLD 4. Carotid artery disease  -1-39% bilateral  5. COPD 6. Venous insufficiency  Past Medical History: 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   Qualifier: Diagnosis of  By: Radene Ou MD, [place] DISEASE, 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    Annotation: 1957 Qualifier: History of  By: Radene Ou MD, [place]     Constipation 05/29/2013   COPD (chronic obstructive pulmonary disease) (HCC)    Exertional shortness of breath    Foreign body sensation in throat 12/16/2016   Gastroesophageal reflux disease with esophagitis 12/16/2016   GERD (gastroesophageal reflux disease)    Headache 03/20/2018   migraine Overview:  Overview:  migraine migraine   Headache(784.0)    migraine    Hemorrhoids    History of stomach ulcers 1990's   Hyperlipemia    HYPERLIPIDEMIA 08/25/2007   Qualifier: Diagnosis of  By: Radene Ou MD, [place]     Hypertension    Hypertension    HYPERTENSION 08/09/2007   Qualifier: Diagnosis of  By: Radene Ou MD, [place]     Idiopathic peripheral autonomic neuropathy 03/27/2019   Left lower quadrant pain 09/05/2018   Left shoulder pain 02/05/2013   Lipoma of abdominal wall 01/23/2012   Migraines    Nasal congestion    Nicotine dependence 11/23/2018   Pharyngeal dysphagia 12/16/2016   Prediabetes 01/10/2013   Rectal bleeding    Scapular dyskinesis 03/26/2018   SHINGLES, RECURRENT 09/30/2008   Qualifier: Diagnosis of  By: Radene Ou MD, [place]     Spondylolisthesis, lumbar region 04/10/2019   Added automatically from request for surgery [phone]  Sternal pain 03/26/2018   Synovial cyst of lumbar facet joint 04/08/2019   Urinary frequency    Weight loss    Wheezing     Past Surgical History: Past Surgical History:  Procedure Laterality Date   APPENDECTOMY  1970's   BACK SURGERY  04/2019   COLONOSCOPY     COLONOSCOPY  12/22/2011   Procedure: COLONOSCOPY;  Surgeon: Lear Ng, MD;  Location: [facility];  Service: Endoscopy;  Laterality: N/A;   heart surgery at 35yr  1955   "blood was going thru my lungs but not thru my heart like it should" (05/09/2013)   KNEE ARTHROSCOPY Left ~ 2004; ~ 2006   LEFT OOPHORECTOMY Left ~ [place]  02/15/2012   Procedure: EXCISION LIPOMA;  Surgeon: Joyice Faster. Cornett, MD;   Location: [facility];  Service: General;  Laterality: Left;  excision lipoma Left Lower abdomen   TOTAL KNEE ARTHROPLASTY Left 05/08/2013   TOTAL KNEE ARTHROPLASTY Left 05/08/2013   Procedure: TOTAL KNEE ARTHROPLASTY;  Surgeon: Ninetta Lights, MD;  Location: [facility];  Service: Orthopedics;  Laterality: Left;   TOTAL KNEE ARTHROPLASTY Right 09/13/2021   Procedure: TOTAL KNEE ARTHROPLASTY;  Surgeon: Gaynelle Arabian, MD;  Location: [facility];  Service: Orthopedics;  Laterality: Right;   TUBAL LIGATION Left ~ 1985   TUMOR EXCISION Left ~ 1984   "under breast" (05/09/2013)    Current Medications: Current Meds  Medication Sig   albuterol (PROVENTIL) (2.5 MG/3ML) 0.083% nebulizer solution Take 3 mLs (2.5 mg total) by nebulization every 6 (six) hours as needed for wheezing or shortness of breath.   albuterol (VENTOLIN HFA) 108 (90 Base) MCG/ACT inhaler Can inhale two puffs every four to six hours as needed for cough or wheeze. (Patient taking differently: Inhale 2 puffs into the lungs every 4 (four) hours as needed for wheezing (cough).)   Cholecalciferol ([employer] D3) 50 MCG (2000 UT) CAPS Take 2,000 Units by mouth daily.   fluticasone (FLONASE) 50 MCG/ACT nasal spray Place 1-2 sprays into both nostrils daily as needed for allergies.   folic acid (FOLVITE) 956 MCG tablet Take 400 mcg by mouth daily.   furosemide (LASIX) 20 MG tablet Take 1 tablet (20 mg total) by mouth daily.   lisinopril (ZESTRIL) 20 MG tablet Take 1 tablet (20 mg total) by mouth daily.   methocarbamol (ROBAXIN) 500 MG tablet Take 1 tablet (500 mg total) by mouth every 6 (six) hours as needed for muscle spasms.   omeprazole (PRILOSEC) 40 MG capsule Take 40 mg by mouth daily.   pyridOXINE (VITAMIN B-6) 100 MG tablet Take 100 mg by mouth daily.   simvastatin (ZOCOR) 20 MG tablet Take 20 mg by mouth at bedtime.   sucralfate (CARAFATE) 1 g tablet Take 1 g by mouth 3 (three) times daily.   traMADol (ULTRAM) 50 MG tablet Take 1-2  tablets (50-100 mg total) by mouth every 6 (six) hours as needed for moderate pain.   vitamin B-12 (CYANOCOBALAMIN) 500 MCG tablet Take 500 mcg by mouth daily.   zinc gluconate 50 MG tablet Take 50 mg by mouth daily.     Allergies:    Sulfa antibiotics, Amoxicillin, Azithromycin, Clindamycin/lincomycin, Erythromycin, Penicillins, Propoxyphene n-acetaminophen, and Clarithromycin   Social History: Social History   Socioeconomic History   Marital status: Divorced    Spouse name: Not on file   Number of children: 2   Years of education: Not on file   Highest education level: Not on file  Occupational History   Occupation: cleaner    Comment: cleans apts and homes  Tobacco Use   Smoking status: Former    Packs/day: 2.00    Years: 35.00    Pack years: 70.00    Types: Cigarettes    Quit date: 08/20/2013    Years since quitting: 8.1   Smokeless tobacco: Never   Tobacco comments:    Currently exposed to 2nd hand smoke in home   Vaping Use   Vaping Use: Never used  Substance and Sexual Activity   Alcohol use: No   Drug use: No   Sexual activity: Not on file  Other Topics Concern   Not on file  Social History Narrative   Lives with her daughter's father.   Social Determinants of Health   Financial Resource Strain: Not on file  Food Insecurity: Not on file  Transportation Needs: Not on file  Physical Activity: Not on file  Stress: Not on file  Social Connections: Not on file     Family History: The patient's family history includes Cancer in her father; Colon cancer in her father; Diabetes in her sister; Heart disease in her father and paternal uncle. There is no history of Malignant hyperthermia.  ROS:   All other ROS reviewed and negative. Pertinent positives noted in the HPI.     EKGs/Labs/Other Studies Reviewed:   The following studies were personally reviewed by me today:  TTE 11/13/2019  1. Left ventricular ejection fraction, by visual estimation, is 60 to  65%.  The left ventricle has normal function. There is no left ventricular  hypertrophy.   2. Elevated left atrial pressure.   3. Left ventricular diastolic parameters are consistent with Grade I  diastolic dysfunction (impaired relaxation).   4. Global right ventricle has normal systolic function.The right  ventricular size is normal. No increase in right ventricular wall  thickness.   5. Left atrial size was mildly dilated.   6. Right atrial size was normal.   7. The mitral valve is normal in structure. No evidence of mitral valve  regurgitation. No evidence of mitral stenosis.   8. The tricuspid valve is normal in structure. Tricuspid valve  regurgitation is not demonstrated.   9. The aortic valve is normal in structure. Aortic valve regurgitation is  not visualized. No evidence of aortic valve sclerosis or stenosis.  10. The pulmonic valve was normal in structure. Pulmonic valve  regurgitation is not visualized.  11. The inferior vena cava is normal in size with greater than 50%  respiratory variability, suggesting right atrial pressure of 3 mmHg.  NM Stress 11/13/2019 Nuclear stress EF: 86%. There was no ST segment deviation noted during stress. No T wave inversion was noted during stress. The study is normal. This is a low risk study. The left ventricular ejection fraction is hyperdynamic (>65%).   Recent Labs: 09/13/2021: ALT 24 09/15/2021: BUN 12; Creatinine, Ser 0.60; Hemoglobin 10.4; Platelets 236; Potassium 3.9; Sodium 132   Recent Lipid Panel    Component Value Date/Time   CHOL 277 (H) 04/30/2008 2259   TRIG 154 (H) 04/30/2008 2259   HDL 55 04/30/2008 2259   CHOLHDL 5.0 Ratio 04/30/2008 2259   VLDL 31 04/30/2008 2259   LDLCALC 191 (H) 04/30/2008 2259    Physical Exam:   VS:  BP (!) 152/86   Pulse 65   Ht 5\' 1"  (1.549 m)   Wt 167 lb 12.8 oz (76.1 kg)   SpO2 97%   BMI 31.71 kg/m  Wt Readings from Last 3 Encounters:  10/19/21 167 lb 12.8 oz (76.1 kg)   09/13/21 172 lb (78 kg)  08/26/21 172 lb (78 kg)    General: Well nourished, well developed, in no acute distress Head: Atraumatic, normal size  Eyes: PEERLA, EOMI  Neck: Supple, no JVD Endocrine: No thryomegaly Cardiac: Normal S1, S2; RRR; no murmurs, rubs, or gallops Lungs: Clear to auscultation bilaterally, no wheezing, rhonchi or rales  Abd: Soft, nontender, no hepatomegaly  Ext: Right knee is swollen and tender to palpation, 1+ edema in the right, erythema noted over the right knee Musculoskeletal: No deformities, BUE and BLE strength normal and equal Skin: Warm and dry, no rashes   Neuro: Alert and oriented to person, place, time, and situation, CNII-XII grossly intact, no focal deficits  Psych: Normal mood and affect   ASSESSMENT:   HENA EWALT is a 75 y.o. female who presents for the following: 1. Leg pain, right   2. Leg edema, right   3. Chronic diastolic heart failure ([place])   4. Venous insufficiency   5. Mixed hyperlipidemia   6. Primary hypertension     PLAN:   1. Leg pain, right 2. Leg edema, right -She presents with swelling in the right knee as well as right calf.  The area is erythematous and tender to palpation.  She was seen by orthopedics yesterday and they believe she may have an infection.  I am concerned for either infection versus acute DVT. -She is not tachycardic.  She is not hypoxic.  Oxygen saturation 97%.  I do not suspect she has a pulmonary embolism.  I think she is safe to be managed as an outpatient.  Vitals are completely stable. -Her weights continue to decline.  She was placed on Lasix.  Of asked her to take Lasix as needed.  Her findings are not consistent with congestive heart failure.  She has unilateral edema after surgery. -We will obtain a DVT study today.  We will also check CBC and BMP.  If DVT study is negative she will need to be work-up for infection.  I would also like for her to discuss this with her orthopedic surgeon.    3.  Chronic diastolic heart failure ([place]) 4. Venous insufficiency -Euvolemic on exam.  Her findings and not related to congestive heart failure.  She has unilateral edema and swelling after surgery of the right knee.  I have asked her to take her Lasix as needed.  She does not need to continue to take this.  Her weight is down from 172 to 167 pounds.  She will continue with compression stockings.  The more pressing matter is ruling out DVT.  5. Primary hypertension -Blood pressure elevated 152/86.  She is in pain and her leg is either infected or she has an acute DVT.  We will discontinue lisinopril for now.  Lasix as needed.  Disposition: Return in about 6 months (around 04/18/2022).  Medication Adjustments/Labs and Tests Ordered: Current medicines are reviewed at length with the patient today.  Concerns regarding medicines are outlined above.  Orders Placed This Encounter  Procedures   CBC   Basic metabolic panel   VAS [place] LOWER EXTREMITY VENOUS (DVT)    No orders of the defined types were placed in this encounter.   Patient Instructions  Medication Instructions:  The current medical regimen is effective;  continue present plan and medications.  *If you need a refill on your cardiac medications before your next appointment,  please call your pharmacy*   Lab Work: CBc, BMET today   If you have labs (blood work) drawn today and your tests are completely normal, you will receive your results only by: [place] (if you have MyChart) OR A paper copy in the mail If you have any lab test that is abnormal or we need to change your treatment, we will call you to review the results.   Testing/Procedures: Your physician has requested that you have a lower or upper extremity venous duplex. This test is an ultrasound of the veins in the legs or arms. It looks at venous blood flow that carries blood from the heart to the legs or arms. Allow one hour for a Lower Venous exam. Allow thirty  minutes for an Upper Venous exam. There are no restrictions or special instructions. [address], [place] [address]  Follow-Up: At [facility], you and your health needs are our priority.  As part of our continuing mission to provide you with exceptional heart care, we have created designated Provider Care Teams.  These Care Teams include your primary Cardiologist (physician) and Advanced Practice Providers (APPs -  Physician Assistants and Nurse Practitioners) who all work together to provide you with the care you need, when you need it.  We recommend signing up for the patient portal called "MyChart".  Sign up information is provided on this After Visit Summary.  MyChart is used to connect with patients for Virtual Visits (Telemedicine).  Patients are able to view lab/test results, encounter notes, upcoming appointments, etc.  Non-urgent messages can be sent to your provider as well.   To learn more about what you can do with MyChart, go to [email or web address].    Your next appointment:   6 month(s)  The format for your next appointment:   In Person  Provider:   Eleonore Chiquito, MD     Time Spent with Patient: I have spent a total of 35 minutes with patient reviewing hospital notes, telemetry, EKGs, labs and examining the patient as well as establishing an assessment and plan that was discussed with the patient.  > 50% of time was spent in direct patient care.  Signed, Addison Naegeli. Audie Box, MD, [place]  [address] [place], [place] [address] [phone]  10/19/2021 10:23 AM

## 2021-10-19 ENCOUNTER — Inpatient Hospital Stay (HOSPITAL_COMMUNITY): Admission: RE | Admit: 2021-10-19 | Payer: Medicare Other | Source: Ambulatory Visit

## 2021-10-19 ENCOUNTER — Emergency Department (HOSPITAL_BASED_OUTPATIENT_CLINIC_OR_DEPARTMENT_OTHER)
Admission: EM | Admit: 2021-10-19 | Discharge: 2021-10-19 | Disposition: A | Discharge status: Home / Self Care | Payer: Medicare Other | Attending: Emergency Medicine | Admitting: Emergency Medicine

## 2021-10-19 ENCOUNTER — Encounter: Payer: Self-pay | Admitting: Cardiovascular Disease

## 2021-10-19 ENCOUNTER — Other Ambulatory Visit: Payer: Self-pay

## 2021-10-19 ENCOUNTER — Ambulatory Visit (INDEPENDENT_AMBULATORY_CARE_PROVIDER_SITE_OTHER): Payer: Medicare Other | Admitting: Cardiovascular Disease

## 2021-10-19 ENCOUNTER — Encounter (HOSPITAL_BASED_OUTPATIENT_CLINIC_OR_DEPARTMENT_OTHER): Payer: Self-pay

## 2021-10-19 ENCOUNTER — Ambulatory Visit (HOSPITAL_BASED_OUTPATIENT_CLINIC_OR_DEPARTMENT_OTHER)
Admission: RE | Admit: 2021-10-19 | Discharge: 2021-10-19 | Disposition: A | Discharge status: Home / Self Care | Payer: Medicare Other | Source: Ambulatory Visit | Attending: Cardiovascular Disease | Admitting: Cardiovascular Disease

## 2021-10-19 VITALS — BP 152/86 | HR 65 | Ht 61.0 in | Wt 167.8 lb

## 2021-10-19 DIAGNOSIS — Z96653 Presence of artificial knee joint, bilateral: Secondary | ICD-10-CM | POA: Insufficient documentation

## 2021-10-19 DIAGNOSIS — I872 Venous insufficiency (chronic) (peripheral): Secondary | ICD-10-CM

## 2021-10-19 DIAGNOSIS — M79604 Pain in right leg: Secondary | ICD-10-CM

## 2021-10-19 DIAGNOSIS — I82401 Acute embolism and thrombosis of unspecified deep veins of right lower extremity: Secondary | ICD-10-CM | POA: Insufficient documentation

## 2021-10-19 DIAGNOSIS — I82461 Acute embolism and thrombosis of right calf muscular vein: Secondary | ICD-10-CM

## 2021-10-19 DIAGNOSIS — I10 Essential (primary) hypertension: Secondary | ICD-10-CM | POA: Diagnosis not present

## 2021-10-19 DIAGNOSIS — Z87891 Personal history of nicotine dependence: Secondary | ICD-10-CM | POA: Insufficient documentation

## 2021-10-19 DIAGNOSIS — R600 Localized edema: Secondary | ICD-10-CM

## 2021-10-19 DIAGNOSIS — J45909 Unspecified asthma, uncomplicated: Secondary | ICD-10-CM | POA: Insufficient documentation

## 2021-10-19 DIAGNOSIS — J449 Chronic obstructive pulmonary disease, unspecified: Secondary | ICD-10-CM | POA: Insufficient documentation

## 2021-10-19 DIAGNOSIS — Z7901 Long term (current) use of anticoagulants: Secondary | ICD-10-CM | POA: Insufficient documentation

## 2021-10-19 DIAGNOSIS — I5032 Chronic diastolic (congestive) heart failure: Secondary | ICD-10-CM | POA: Diagnosis not present

## 2021-10-19 DIAGNOSIS — Z79899 Other long term (current) drug therapy: Secondary | ICD-10-CM | POA: Diagnosis not present

## 2021-10-19 DIAGNOSIS — E782 Mixed hyperlipidemia: Secondary | ICD-10-CM

## 2021-10-19 LAB — CBC
Hematocrit: 33.7 % — ABNORMAL LOW (ref 34.0–46.6)
Hemoglobin: 10.9 g/dL — ABNORMAL LOW (ref 11.1–15.9)
MCH: 27.5 pg (ref 26.6–33.0)
MCHC: 32.3 g/dL (ref 31.5–35.7)
MCV: 85 fL (ref 79–97)
Platelets: 289 10*3/uL (ref 150–450)
RBC: 3.97 x10E6/uL (ref 3.77–5.28)
RDW: 13.6 % (ref 11.7–15.4)
WBC: 7 10*3/uL (ref 3.4–10.8)

## 2021-10-19 LAB — BASIC METABOLIC PANEL
BUN/Creatinine Ratio: 24 (ref 12–28)
BUN: 15 mg/dL (ref 8–27)
CO2: 22 mmol/L (ref 20–29)
Calcium: 9.9 mg/dL (ref 8.7–10.3)
Chloride: 101 mmol/L (ref 96–106)
Creatinine, Ser: 0.62 mg/dL (ref 0.57–1.00)
Glucose: 88 mg/dL (ref 70–99)
Potassium: 4.2 mmol/L (ref 3.5–5.2)
Sodium: 138 mmol/L (ref 134–144)
eGFR: 94 mL/min/{1.73_m2} (ref >59)

## 2021-10-19 MED ORDER — RIVAROXABAN 20 MG PO TABS: 20.0000 mg | ORAL_TABLET | Freq: Every day | ORAL | 1 refills | Status: DC

## 2021-10-19 MED ORDER — OXYCODONE HCL 5 MG PO TABS
5.0000 mg | ORAL_TABLET | Freq: Once | ORAL | Status: AC
  Administered 2021-10-19: 5 mg via ORAL
  Filled 2021-10-19: qty 1

## 2021-10-19 MED ORDER — OXYCODONE HCL 5 MG PO TABS: 5.0000 mg | ORAL_TABLET | ORAL | 0 refills | Status: DC | PRN

## 2021-10-19 NOTE — ED Provider Notes (Signed)
[facility] Provider Note   CSN: [id number] Arrival date & time: 10/19/21  1725     History Chief Complaint  Patient presents with   Leg Pain    Summer Jackson is a 75 y.o. female.  HPI     75yo female presenting with leg pain and swelling, diagnosed with DVT today For a few days has had pain and swelling to LLE, 10/10 pain No chest pain or dyspnea, no lightheadedness  Diagnosed with infection over knee by orthopedic dr and started doxycycline, had DVT study performed today and cardiology gave her xarelto rx She heard there was maybe a shot to dissolve the clot and came to ED as she would rather have this then anticoagulation with xarelto.  She reports the pain is severe.    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   Qualifier: Diagnosis of  By: Radene Ou MD, [place] DISEASE, 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   Annotation: 1955 Qualifier: History of  By: Radene Ou MD, [place]     Constipation 05/29/2013   COPD (chronic obstructive pulmonary disease) (HCC)    Exertional shortness of breath    Foreign body sensation in throat 12/16/2016   Gastroesophageal reflux disease with esophagitis 12/16/2016   GERD (gastroesophageal reflux disease)    Headache 03/20/2018   migraine Overview:  Overview:  migraine migraine   Headache(784.0)    migraine    Hemorrhoids    History of stomach ulcers 1990's   Hyperlipemia    HYPERLIPIDEMIA 08/25/2007   Qualifier: Diagnosis of  By: Radene Ou MD, [place]     Hypertension    Hypertension    HYPERTENSION 08/09/2007   Qualifier: Diagnosis of  By: Radene Ou MD, [place]     Idiopathic peripheral autonomic neuropathy 03/27/2019   Left lower quadrant pain 09/05/2018   Left shoulder pain 02/05/2013   Lipoma of abdominal wall  01/23/2012   Migraines    Nasal congestion    Nicotine dependence 11/23/2018   Pharyngeal dysphagia 12/16/2016   Prediabetes 01/10/2013   Rectal bleeding    Scapular dyskinesis 03/26/2018   SHINGLES, RECURRENT 09/30/2008   Qualifier: Diagnosis of  By: Radene Ou MD, [place]     Spondylolisthesis, lumbar region 04/10/2019   Added automatically from request for surgery [id number]   Sternal pain 03/26/2018   Synovial cyst of lumbar facet joint 04/08/2019   Urinary frequency    Weight loss    Wheezing     Patient Active Problem List   Diagnosis Date Noted   Right leg DVT ([place]) 10/19/2021   Primary osteoarthritis of right knee 53/97/6734   Diastolic dysfunction 19/37/9024   Chronic heart failure with preserved ejection fraction ([place]) 04/02/2020   Obesity (BMI 30-39.9) 04/02/2020   Diabetes mellitus screening 02/14/2020   Bilateral leg edema 02/14/2020   Long-term use of aspirin therapy 10/03/2019   Tobacco abuse 10/03/2019   Postoperative follow-up 05/03/2019   Spondylolisthesis, lumbar region 04/10/2019   Synovial cyst of lumbar facet joint 04/08/2019   Cellulitis of left lower leg 04/08/2019   Chronic pain syndrome 03/27/2019   Left lumbar radiculopathy 03/27/2019   Osteopenia of multiple sites 01/18/2019   Vitamin D deficiency 11/23/2018   Left lower quadrant pain 09/05/2018   Scapular dyskinesis 03/26/2018  Sternal pain 03/26/2018   Headache 03/20/2018   Foreign body sensation in throat 12/16/2016   Gastroesophageal reflux disease with esophagitis 12/16/2016   Pharyngeal dysphagia 12/16/2016   Constipation 05/29/2013   Allergy to pollen 05/29/2013   Hypertension    Headache(784.0)    GERD (gastroesophageal reflux disease)    Left shoulder pain 02/05/2013   Chronic cough 01/24/2013   Prediabetes 01/10/2013   Lipoma of abdominal wall 01/23/2012   Hemorrhoids 01/23/2012   OA (osteoarthritis) of knee 09/10/2009   SHINGLES, RECURRENT 09/30/2008   Hyperlipidemia 08/25/2007    COMMON MIGRAINE 08/09/2007   HYPERTENSION 08/09/2007   CONGENITAL HEART DISEASE, 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 Cornett, MD;  Location: [facility];  Service: General;  Laterality: Left;  excision lipoma Left Lower abdomen   TOTAL KNEE ARTHROPLASTY Left 05/08/2013   TOTAL KNEE ARTHROPLASTY Left 05/08/2013   Procedure: TOTAL KNEE ARTHROPLASTY;  Surgeon: Ninetta Lights, MD;  Location: [facility];  Service: Orthopedics;  Laterality: Left;   TOTAL KNEE ARTHROPLASTY Right 09/13/2021   Procedure: TOTAL KNEE ARTHROPLASTY;  Surgeon: Gaynelle Arabian, MD;  Location: [facility];  Service: Orthopedics;  Laterality: Right;   TUBAL LIGATION Left ~ [place] Left ~ 1984   "under breast" (05/09/2013)     OB History   No obstetric history on file.     Family History  Problem Relation Age of Onset   Colon cancer Father    Cancer Father        colon   Heart disease Father    Heart disease Paternal Uncle    Diabetes Sister    Malignant hyperthermia Neg Hx     Social History   Tobacco Use   Smoking status: Former    Packs/day: 2.00    Years: 35.00    Pack years: 70.00    Types: Cigarettes    Quit date: 08/20/2013    Years since quitting: 8.1   Smokeless tobacco: Never   Tobacco comments:    Currently exposed to 2nd hand smoke in home   Vaping Use   Vaping Use: Never used  Substance Use Topics   Alcohol use: No   Drug use: No    Home Medications Prior to  Admission medications   Medication Sig Start Date End Date Taking? Authorizing Provider  oxyCODONE (ROXICODONE) 5 MG immediate release tablet Take 1 tablet (5 mg total) by mouth every 4 (four) hours as needed for severe pain. 10/19/21  Yes Gareth Morgan, MD  albuterol (PROVENTIL) (2.5 MG/3ML) 0.083% nebulizer solution Take 3 mLs (2.5 mg total) by nebulization every 6 (six) hours as needed for wheezing or shortness of breath. 10/04/14   Piepenbrink, Anderson Malta, PA-C  albuterol (VENTOLIN HFA) 108 (90 Base) MCG/ACT inhaler Can inhale two puffs every four to six hours as needed for cough or wheeze. Patient taking differently: Inhale 2 puffs into  the lungs every 4 (four) hours as needed for wheezing (cough). 12/03/19   Kennith Gain, MD  Cholecalciferol (CVS D3) 50 MCG (2000 UT) CAPS Take 2,000 Units by mouth daily.    [provider]  fluticasone (FLONASE) 50 MCG/ACT nasal spray Place 1-2 sprays into both nostrils daily as needed for allergies. 12/03/19   [provider]  folic acid (FOLVITE) 128 MCG tablet Take 400 mcg by mouth daily.    [provider]  furosemide (LASIX) 20 MG tablet Take 1 tablet (20 mg total) by mouth daily. 10/14/21   O'NealCassie Freer, MD  lisinopril (ZESTRIL) 20 MG tablet Take 1 tablet (20 mg total) by mouth daily. 10/06/21   O'Neal, Cassie Freer, MD  methocarbamol (ROBAXIN) 500 MG tablet Take 1 tablet (500 mg total) by mouth every 6 (six) hours as needed for muscle spasms. 09/14/21   Edmisten, Ok Anis, PA  omeprazole (PRILOSEC) 40 MG capsule Take 40 mg by mouth daily. 11/06/19   [provider]  pyridOXINE (VITAMIN B-6) 100 MG tablet Take 100 mg by mouth daily.    [provider]  rivaroxaban (XARELTO) 20 MG TABS tablet Take 1 tablet (20 mg total) by mouth daily with supper. 10/19/21   Lorretta Harp, MD  simvastatin (ZOCOR) 20 MG tablet Take 20 mg by mouth at bedtime. 09/02/19   [provider]  sucralfate  (CARAFATE) 1 g tablet Take 1 g by mouth 3 (three) times daily. 02/15/19   [provider]  vitamin B-12 (CYANOCOBALAMIN) 500 MCG tablet Take 500 mcg by mouth daily.    [provider]  zinc gluconate 50 MG tablet Take 50 mg by mouth daily.    [provider]    Allergies    Sulfa antibiotics, Amoxicillin, Azithromycin, Clindamycin/lincomycin, Erythromycin, Penicillins, Propoxyphene n-acetaminophen, and Clarithromycin  Review of Systems   Review of Systems  Constitutional:  Negative for fever.  Respiratory:  Negative for cough and shortness of breath.   Cardiovascular:  Negative for chest pain.  Gastrointestinal:  Negative for vomiting.  Musculoskeletal:  Positive for arthralgias and myalgias.  Skin:  Positive for color change.  Neurological:  Negative for syncope, light-headedness and headaches.   Physical Exam Updated Vital Signs BP (!) 160/83   Pulse 80   Temp 98.1 F (36.7 C) (Oral)   Resp (!) 22   Ht 5\' 1"  (1.549 m)   Wt 75.8 kg   SpO2 100%   BMI 31.55 kg/m   Physical Exam Vitals and nursing note reviewed.  Constitutional:      General: She is not in acute distress.    Appearance: Normal appearance. She is not ill-appearing, toxic-appearing or diaphoretic.  HENT:     Head: Normocephalic.  Eyes:     Conjunctiva/sclera: Conjunctivae normal.  Cardiovascular:     Rate and Rhythm: Normal rate and regular rhythm.     Pulses: Normal pulses.  Pulmonary:     Effort: Pulmonary effort is normal. No respiratory distress.  Musculoskeletal:        General: Swelling (RLE) and tenderness (right calf, knee tenderness) present. No deformity or signs of injury.     Cervical back: No rigidity.  Skin:    General: Skin is warm and dry.     Coloration: Skin is not jaundiced or pale.     Comments: Incision right knee mild erythema  Neurological:     General: No focal deficit present.     Mental Status: She is alert and oriented  to person, place, and time.     ED Results / Procedures / Treatments   Labs (all labs ordered are listed, but only abnormal results are displayed) Labs Reviewed - No data to display  EKG EKG Interpretation  Date/Time:  Tuesday October 19 2021 17:58:37 EDT Ventricular Rate:  81 PR Interval:  168 QRS Duration: 76 QT Interval:  364 QTC Calculation: 422 R Axis:   14 Text Interpretation: Normal sinus rhythm Cannot rule out Anterior infarct , age undetermined Abnormal ECG No significant change since last tracing Confirmed by Gareth Morgan [phone]) on 10/20/2021 10:34:06 PM  Radiology VAS [place] LOWER EXTREMITY VENOUS (DVT)  Result Date: 10/19/2021  Lower Venous DVT Study Patient Name:  SERAPHIM AFFINITO  Date of Exam:   10/19/2021 Medical Rec #: [id number]         Accession #:    [id number] Date of Birth: [birth year]         Patient Gender: F Patient Age:   54 years Exam Location:  [facility] Procedure:      VAS [place] LOWER EXTREMITY VENOUS (DVT) Referring Phys: [place] O'NEAL --------------------------------------------------------------------------------  Indications: Patient reports pain throughout the right lower extremity. She states she was told she has an infection in her leg post right knee surgery. She denies SOB.  Risk Factors: Surgery to the right knee; total right knee arthroplasty on 09/13/2021. Comparison Study: NA Performing Technologist: Sharlett Iles RVT  Examination Guidelines: A complete evaluation includes B-mode imaging, spectral Doppler, color Doppler, and power Doppler as needed of all accessible portions of each vessel. Bilateral testing is considered an integral part of a complete examination. Limited examinations for reoccurring indications may be performed as noted. The reflux portion of the exam is performed with the patient in reverse Trendelenburg.  +---------+---------------+---------+-----------+---------------+--------------+ RIGHT    CompressibilityPhasicitySpontaneityProperties     Thrombus Aging  +---------+---------------+---------+-----------+---------------+--------------+ CFV      Full           Yes      Yes                                      +---------+---------------+---------+-----------+---------------+--------------+ SFJ      Full           Yes      Yes                                      +---------+---------------+---------+-----------+---------------+--------------+ FV Prox  Full           Yes      Yes                                      +---------+---------------+---------+-----------+---------------+--------------+ FV Mid   Full                                                             +---------+---------------+---------+-----------+---------------+--------------+ FV DistalFull           Yes      Yes                                      +---------+---------------+---------+-----------+---------------+--------------+  PFV      Full           Yes      Yes                                      +---------+---------------+---------+-----------+---------------+--------------+ POP      Full           Yes      Yes                                      +---------+---------------+---------+-----------+---------------+--------------+ PTV      Full           Yes      Yes                                      +---------+---------------+---------+-----------+---------------+--------------+ PERO     Full           Yes      Yes                                      +---------+---------------+---------+-----------+---------------+--------------+ Gastroc  None                               softly         Acute                                                      echogenic                     +---------+---------------+---------+-----------+---------------+--------------+ GSV      Full           Yes      Yes                                      +---------+---------------+---------+-----------+---------------+--------------+    +----+---------------+---------+-----------+----------+--------------+ LEFTCompressibilityPhasicitySpontaneityPropertiesThrombus Aging +----+---------------+---------+-----------+----------+--------------+ CFV Full           Yes      Yes                                 +----+---------------+---------+-----------+----------+--------------+   Findings reported to Dr. Gwenlyn Found at 1:00 pm.  Summary: RIGHT: - Findings consistent with acute deep vein thrombosis involving the right gastrocnemius veins. - No cystic structure found in the popliteal fossa.  LEFT: - No evidence of common femoral vein obstruction.  *See table(s) above for measurements and observations. Electronically signed by Jenkins Rouge MD on 10/19/2021 at 2:16:38 PM.    Final     Procedures Procedures   Medications Ordered in ED Medications  oxyCODONE (Oxy IR/ROXICODONE) immediate release tablet 5 mg (5 mg Oral Given 10/19/21 2228)    ED Course  I have reviewed the triage vital signs and the nursing notes.  Pertinent labs & imaging results that were available during my care of the patient were reviewed by me and considered in my medical decision making (see chart for details).    MDM Rules/Calculators/A&P                            75yo female diagnosed with DVT today and started on xarelto, given rx for doxycycline by orthopedic surgeon for warmth around incision per pt, who presents with concern for DVT and desire for other possible treatments.  Discussed that oral anticoagulation or lovenox would be treatment for DVT, do not see signs of cerulea doleans/albicans, and that outpatient vascular surgery evaluation is appropriate for DVT and related pain.  Recommend taking the xarelto she was prescribed.  Not having symptoms of PE. Was started on abx by orthopedic surgeon, defer to their opinion regarding knee, septic arthritis seems less likely, not having significant cellulitis, no signs of sepsis. Patient discharged in  stable condition with understanding of reasons to return.      Final Clinical Impression(s) / ED Diagnoses Final diagnoses:  Acute deep vein thrombosis (DVT) of calf muscle vein of right lower extremity ([place])    Rx / DC Orders ED Discharge Orders          Ordered    oxyCODONE (ROXICODONE) 5 MG immediate release tablet  Every 4 hours PRN        10/19/21 2221             Gareth Morgan, MD 10/20/21 2245

## 2021-10-19 NOTE — ED Triage Notes (Addendum)
Found of she has a known DVT to right leg. Had knee surgery in September. States was supposed to start thinners but would rather have "the shot of medicine to dissolve the clot". Pain to leg. Had bloodwork drawn this morning.

## 2021-10-19 NOTE — Assessment & Plan Note (Signed)
[name] had right total knee replacement by Dr. Wynelle Link 09/13/2021.  She developed swelling pain and warmth in her right leg over the last several days.  Dopplers today revealed a right gastrinomas vein DVT.  There is also question of infection.  Right calf is swollen and edematous as well as painful to touch.  I am going to start her on a Xarelto starter pack, will check lower extremity venous Doppler studies in 3 months and then back to see Dr. Audie Box in follow-up.

## 2021-10-19 NOTE — Progress Notes (Signed)
10/19/2021 Summer Jackson   April 17, 1946  [id number]  Primary Physician [facility] Primary Cardiologist: Lorretta Harp MD Garret Reddish, [place], [place]  HPI:  Summer Jackson is a 75 y.o. moderately overweight Caucasian female patient of Dr. Kathalene Frames  with a history of heart failure with preserved EF, venous insufficiency, hypertension, hyperlipidemia and COPD.  She had right total knee replacement by Dr. Wynelle Link 09/13/2021.  She developed pain swelling and warmth in her right knee and lower extremity proximally 3 to 4 days ago and saw the orthopedic surgeon.  Dr. Audie Box  ordered venous Doppler study that was positive for a right gastrocnemius vein DVT.   Current Meds  Medication Sig   albuterol (PROVENTIL) (2.5 MG/3ML) 0.083% nebulizer solution Take 3 mLs (2.5 mg total) by nebulization every 6 (six) hours as needed for wheezing or shortness of breath.   albuterol (VENTOLIN HFA) 108 (90 Base) MCG/ACT inhaler Can inhale two puffs every four to six hours as needed for cough or wheeze. (Patient taking differently: Inhale 2 puffs into the lungs every 4 (four) hours as needed for wheezing (cough).)   Cholecalciferol (CVS D3) 50 MCG (2000 UT) CAPS Take 2,000 Units by mouth daily.   fluticasone (FLONASE) 50 MCG/ACT nasal spray Place 1-2 sprays into both nostrils daily as needed for allergies.   folic acid (FOLVITE) 637 MCG tablet Take 400 mcg by mouth daily.   furosemide (LASIX) 20 MG tablet Take 1 tablet (20 mg total) by mouth daily.   lisinopril (ZESTRIL) 20 MG tablet Take 1 tablet (20 mg total) by mouth daily.   methocarbamol (ROBAXIN) 500 MG tablet Take 1 tablet (500 mg total) by mouth every 6 (six) hours as needed for muscle spasms.   omeprazole (PRILOSEC) 40 MG capsule Take 40 mg by mouth daily.   pyridOXINE (VITAMIN B-6) 100 MG tablet Take 100 mg by mouth daily.   simvastatin (ZOCOR) 20 MG tablet Take 20 mg by mouth at bedtime.   sucralfate (CARAFATE) 1 g tablet Take 1 g  by mouth 3 (three) times daily.   traMADol (ULTRAM) 50 MG tablet Take 1-2 tablets (50-100 mg total) by mouth every 6 (six) hours as needed for moderate pain.   vitamin B-12 (CYANOCOBALAMIN) 500 MCG tablet Take 500 mcg by mouth daily.   zinc gluconate 50 MG tablet Take 50 mg by mouth daily.     Allergies  Allergen Reactions   Sulfa Antibiotics Swelling and Rash   Amoxicillin Other (See Comments)    REACTION: headache   Azithromycin Other (See Comments)    REACTION: reddness around mouth   Clindamycin/Lincomycin Hives   Erythromycin Hives   Penicillins Other (See Comments)    REACTION: headache Has patient had a PCN reaction causing immediate rash, facial/tongue/throat swelling, SOB or lightheadedness with hypotension: No Has patient had a PCN reaction causing severe rash involving mucus membranes or skin necrosis: No Has patient had a PCN reaction that required hospitalization No Has patient had a PCN reaction occurring within the last 10 years: No If all of the above answers are NO, then may proceed with Cephalosporin use.  Tolerated Cephalosporin Date: 09/14/21.   Propoxyphene N-Acetaminophen Other (See Comments)    REACTION: mouth sores   Clarithromycin Rash    REACTION: skin rash with minimal vesicular formation on arms and hands    Social History   Socioeconomic History   Marital status: Divorced    Spouse name: Not on file   Number of children:  2   Years of education: Not on file   Highest education level: Not on file  Occupational History   Occupation: cleaner    Comment: cleans apts and homes  Tobacco Use   Smoking status: Former    Packs/day: 2.00    Years: 35.00    Pack years: 70.00    Types: Cigarettes    Quit date: 08/20/2013    Years since quitting: 8.1   Smokeless tobacco: Never   Tobacco comments:    Currently exposed to 2nd hand smoke in home   Vaping Use   Vaping Use: Never used  Substance and Sexual Activity   Alcohol use: No   Drug use: No    Sexual activity: Not on file  Other Topics Concern   Not on file  Social History Narrative   Lives with her daughter's father.   Social Determinants of Health   Financial Resource Strain: Not on file  Food Insecurity: Not on file  Transportation Needs: Not on file  Physical Activity: Not on file  Stress: Not on file  Social Connections: Not on file  Intimate Partner Violence: Not on file     Review of Systems: General: negative for chills, fever, night sweats or weight changes.  Cardiovascular: negative for chest pain, dyspnea on exertion, edema, orthopnea, palpitations, paroxysmal nocturnal dyspnea or shortness of breath Dermatological: negative for rash Respiratory: negative for cough or wheezing Urologic: negative for hematuria Abdominal: negative for nausea, vomiting, diarrhea, bright red blood per rectum, melena, or hematemesis Neurologic: negative for visual changes, syncope, or dizziness All other systems reviewed and are otherwise negative except as noted above.    Blood pressure 130/72, pulse 86, height 5\' 1"  (1.549 m), weight 167 lb (75.8 kg), SpO2 99 %.  General appearance: alert and no distress Neck: no adenopathy, no carotid bruit, no JVD, supple, symmetrical, trachea midline, and thyroid not enlarged, symmetric, no tenderness/mass/nodules Lungs: clear to auscultation bilaterally Heart: regular rate and rhythm, S1, S2 normal, no murmur, click, rub or gallop Extremities: 2+ edema right lower extremity Pulses: 2+ and symmetric Skin: Skin color, texture, turgor normal. No rashes or lesions Neurologic: Grossly normal  EKG not performed today  ASSESSMENT AND PLAN:   Right leg DVT ([place]) [name] had right total knee replacement by Dr. Wynelle Link 09/13/2021.  She developed swelling pain and warmth in her right leg over the last several days.  Dopplers today revealed a right gastrinomas vein DVT.  There is also question of infection.  Right calf is swollen and  edematous as well as painful to touch.  I am going to start her on a Xarelto starter pack, will check lower extremity venous Doppler studies in 3 months and then back to see Dr. Audie Box in follow-up.     Lorretta Harp MD FACP,FACC,FAHA, [facility] 10/19/2021 1:27 PM

## 2021-10-19 NOTE — Patient Instructions (Signed)
Medication Instructions:   -Xarelto DVT starter pack. For 3 weeks you will taking xarelto 15mg  twice daily. Then take one 20mg  once daily with supper.  *If you need a refill on your cardiac medications before your next appointment, please call your pharmacy*   Testing/Procedures: Your physician has requested that you have a lower extremity venous duplex. This test is an ultrasound of the veins in the legs. It looks at venous blood flow that carries blood from the heart to the legs. Allow one hour for a Lower Venous exam. There are no restrictions or special instructions. To be done in 3 months. This procedure is done at [facility].     Follow-Up: At [facility], you and your health needs are our priority.  As part of our continuing mission to provide you with exceptional heart care, we have created designated Provider Care Teams.  These Care Teams include your primary Cardiologist (physician) and Advanced Practice Providers (APPs -  Physician Assistants and Nurse Practitioners) who all work together to provide you with the care you need, when you need it.  We recommend signing up for the patient portal called "MyChart".  Sign up information is provided on this After Visit Summary.  MyChart is used to connect with patients for Virtual Visits (Telemedicine).  Patients are able to view lab/test results, encounter notes, upcoming appointments, etc.  Non-urgent messages can be sent to your provider as well.   To learn more about what you can do with MyChart, go to [email or web address].    Your next appointment:   3 month(s)  The format for your next appointment:   In Person  Provider:   Eleonore Chiquito, MD

## 2021-10-19 NOTE — Patient Instructions (Addendum)
Medication Instructions:  The current medical regimen is effective;  continue present plan and medications.  *If you need a refill on your cardiac medications before your next appointment, please call your pharmacy*   Lab Work: CBc, BMET today   If you have labs (blood work) drawn today and your tests are completely normal, you will receive your results only by: [place] (if you have MyChart) OR A paper copy in the mail If you have any lab test that is abnormal or we need to change your treatment, we will call you to review the results.   Testing/Procedures: Your physician has requested that you have a lower or upper extremity venous duplex. This test is an ultrasound of the veins in the legs or arms. It looks at venous blood flow that carries blood from the heart to the legs or arms. Allow one hour for a Lower Venous exam. Allow thirty minutes for an Upper Venous exam. There are no restrictions or special instructions. [address] [place] [address]  Follow-Up: At [facility], you and your health needs are our priority.  As part of our continuing mission to provide you with exceptional heart care, we have created designated Provider Care Teams.  These Care Teams include your primary Cardiologist (physician) and Advanced Practice Providers (APPs -  Physician Assistants and Nurse Practitioners) who all work together to provide you with the care you need, when you need it.  We recommend signing up for the patient portal called "MyChart".  Sign up information is provided on this After Visit Summary.  MyChart is used to connect with patients for Virtual Visits (Telemedicine).  Patients are able to view lab/test results, encounter notes, upcoming appointments, etc.  Non-urgent messages can be sent to your provider as well.   To learn more about what you can do with MyChart, go to [email or web address].    Your next appointment:   6 month(s)  The format for your next  appointment:   In Person  Provider:   Eleonore Chiquito, MD

## 2021-10-19 NOTE — Progress Notes (Signed)
Seen by Dr. Gwenlyn Found and started on xarelto. Follow-up with me in 3 months.   [place] T. Audie Box, MD, [place]  [address], [place], [place] [address] [phone]  7:27 PM

## 2021-10-20 ENCOUNTER — Telehealth: Payer: Self-pay | Admitting: Cardiovascular Disease

## 2021-10-20 NOTE — Telephone Encounter (Signed)
Pt is returning call for blood work results.  Please advise pt further

## 2021-10-21 NOTE — Telephone Encounter (Signed)
Patient called back in today upset that nobody had returned her call- I advised that I was unaware of the message she had called, I spoke with patient, she is better now after speaking with me in regards to the blood work.  Patient states she will call us if she has any other issues or concerns. She did start medications as prescribed.   Thanks!

## 2021-10-27 ENCOUNTER — Telehealth: Payer: Self-pay | Admitting: Cardiovascular Disease

## 2021-10-27 NOTE — Telephone Encounter (Signed)
Unable to reach pt or leave a message  

## 2021-10-27 NOTE — Telephone Encounter (Signed)
Patient called stating right foot has a knot in and is swollen from her knee down to her foot.  She states Dr. Audie Box was the one that gave her the test that found the blood clot in her leg.

## 2021-10-29 NOTE — Telephone Encounter (Signed)
Unable to reach pt or leave a message  

## 2021-11-02 NOTE — Telephone Encounter (Signed)
Pt has f/u LE VENOUS (DVT) and appt with Dr Marisue Ivan. Will await call back, f/u appt's

## 2021-11-05 ENCOUNTER — Other Ambulatory Visit: Payer: Self-pay | Admitting: Pharmacist

## 2021-11-05 ENCOUNTER — Encounter: Payer: Self-pay | Admitting: Pharmacist

## 2021-11-05 DIAGNOSIS — T7840XA Allergy, unspecified, initial encounter: Secondary | ICD-10-CM

## 2021-11-05 MED ORDER — APIXABAN 5 MG PO TABS
5.0000 mg | ORAL_TABLET | Freq: Two times a day (BID) | ORAL | 1 refills | Status: DC
Start: 2021-11-05 — End: 2021-11-23

## 2021-11-05 MED ORDER — METHYLPREDNISOLONE SODIUM SUCC 125 MG IJ SOLR
125.0000 mg | Freq: Once | INTRAMUSCULAR | Status: AC
Start: 2021-11-05 — End: 2021-11-05
  Administered 2021-11-05: 125 mg via INTRAMUSCULAR

## 2021-11-05 NOTE — Progress Notes (Signed)
Attempted to call patient to see how she was feeling after steroid injection this morning.  No answer, VM full.  Xarelto has been changed to Eliquis due to possible allergic reactions.

## 2021-11-05 NOTE — Progress Notes (Unsigned)
Solu-Medrol 125mg  IM given by Dolores Hoose RN on right hip.  Summer Jackson in room

## 2021-11-08 ENCOUNTER — Telehealth: Payer: Self-pay | Admitting: Pharmacist

## 2021-11-08 NOTE — Telephone Encounter (Signed)
Called and spoke with patient.  Patient reports she is feeling much better and is not itching anymore.  Was able to pick up Eliquis and Benedryl.  So far no reactions to Eliquis.  Xarelto added to allergy list.

## 2021-11-10 ENCOUNTER — Telehealth: Payer: Self-pay | Admitting: Cardiovascular Disease

## 2021-11-10 ENCOUNTER — Telehealth: Payer: Self-pay

## 2021-11-10 ENCOUNTER — Other Ambulatory Visit: Payer: Self-pay

## 2021-11-10 DIAGNOSIS — I82461 Acute embolism and thrombosis of right calf muscular vein: Secondary | ICD-10-CM

## 2021-11-10 MED ORDER — WARFARIN SODIUM 5 MG PO TABS: 5.0000 mg | ORAL_TABLET | Freq: Every day | ORAL | 0 refills | Status: DC

## 2021-11-10 NOTE — Telephone Encounter (Signed)
Patient of Dr. Audie Box walked in to office today w/complaints of red splotches on chest, reaction from Eliquis. She recently had a reaction to Xarelto and presented to office and med was changed to Eliquis.  Meredith Mody (front desk rep) asked for pharmacy team assistance and Erasmo Downer [facility] advised for patient to present to urgent care for eval. Since she cannot provide medical advice, triage was consulted.  This Probation officer spoke with patient regarding her concerns. She reports side effects on Xarelto. She now has side effects on Eliquis of red splotches. She has post-op DVT. She is upset that she came here from [facility].  Patient states she was given a steroid shot in our office last week, and at time of conversation this Probation officer was unaware that nurse gave solumedrol IM per C. Pavero RPH (11/05/21)  This writer advised she present to urgent care, per recommendation from pharmacist   Advised pharmacy team would be notified to contact her about anticoagulant options

## 2021-11-10 NOTE — Telephone Encounter (Signed)
If patient has true allergy to both Xarelto and Eliquis, will need to transition to warfarin.  Will route in [place] to assist with scheduling.

## 2021-11-10 NOTE — Telephone Encounter (Signed)
I spoke to the patient and started her on Warfarin 5 mg Daily transitioning from Eliquis.  She has a New Coumadin appointment 11/28 @ 2:30.

## 2021-11-15 ENCOUNTER — Ambulatory Visit (INDEPENDENT_AMBULATORY_CARE_PROVIDER_SITE_OTHER): Payer: Medicare Other

## 2021-11-15 ENCOUNTER — Other Ambulatory Visit: Payer: Self-pay

## 2021-11-15 DIAGNOSIS — Z7901 Long term (current) use of anticoagulants: Secondary | ICD-10-CM

## 2021-11-15 LAB — POCT INR: INR: 4.8 — AB (ref 2.0–3.0)

## 2021-11-15 NOTE — Patient Instructions (Signed)
HOLD Tonight and Tuesday night and then Take 0.5 tablet Daily, except Monday and Friday, take 1 tablet.  INR 1 week.  A full discussion of the nature of anticoagulants has been carried out.  A benefit risk analysis has been presented to the patient, so that they understand the justification for choosing anticoagulation at this time. The need for frequent and regular monitoring, precise dosage adjustment and compliance is stressed.  Side effects of potential bleeding are discussed.  The patient should avoid any OTC items containing aspirin or ibuprofen, and should avoid great swings in general diet.  Avoid alcohol consumption.  Call if any signs of abnormal bleeding.  [phone]

## 2021-11-23 ENCOUNTER — Other Ambulatory Visit: Payer: Self-pay

## 2021-11-23 ENCOUNTER — Ambulatory Visit (INDEPENDENT_AMBULATORY_CARE_PROVIDER_SITE_OTHER): Payer: Medicare Other | Admitting: *Deleted

## 2021-11-23 DIAGNOSIS — I82461 Acute embolism and thrombosis of right calf muscular vein: Secondary | ICD-10-CM | POA: Diagnosis not present

## 2021-11-23 DIAGNOSIS — Z7901 Long term (current) use of anticoagulants: Secondary | ICD-10-CM

## 2021-11-23 LAB — POCT INR: INR: 2.2 (ref 2.0–3.0)

## 2021-11-23 MED ORDER — WARFARIN SODIUM 2.5 MG PO TABS: ORAL_TABLET | ORAL | 0 refills | Status: DC

## 2021-11-23 MED ORDER — DABIGATRAN ETEXILATE MESYLATE 150 MG PO CAPS: 150.0000 mg | ORAL_CAPSULE | Freq: Two times a day (BID) | ORAL | 1 refills | Status: DC

## 2021-11-23 NOTE — Patient Instructions (Addendum)
Description   Stop warfarin.  Start Pradaxa on 12/7. Take 1 tablet by mouth two times daily.  Per Dr. Jenetta DownerNori Riis if your rash does not clear up in 1 week please see dermatologist.

## 2021-11-24 ENCOUNTER — Ambulatory Visit: Payer: Medicare Other | Admitting: Cardiovascular Disease

## 2021-12-09 ENCOUNTER — Ambulatory Visit (INDEPENDENT_AMBULATORY_CARE_PROVIDER_SITE_OTHER): Payer: Medicare Other | Admitting: Podiatry

## 2021-12-09 ENCOUNTER — Other Ambulatory Visit: Payer: Self-pay

## 2021-12-09 DIAGNOSIS — Z7901 Long term (current) use of anticoagulants: Secondary | ICD-10-CM | POA: Diagnosis not present

## 2021-12-09 DIAGNOSIS — M79675 Pain in left toe(s): Secondary | ICD-10-CM

## 2021-12-09 DIAGNOSIS — L84 Corns and callosities: Secondary | ICD-10-CM

## 2021-12-09 DIAGNOSIS — M79674 Pain in right toe(s): Secondary | ICD-10-CM | POA: Diagnosis not present

## 2021-12-09 DIAGNOSIS — B351 Tinea unguium: Secondary | ICD-10-CM

## 2021-12-09 NOTE — Progress Notes (Signed)
Subjective: 75 year old female presents the office today for concerns of thick, elongated toenails that she cannot trim herself.  The nails cause ingrowing but denies any swelling or any drainage.  Also she has a corn left fifth toe causing discomfort.  Since I last saw her she had right knee surgery and subsequently had a DVT and she is currently on Pradaxa.  She had swelling to the right leg and foot since DVT.  No pain to the foot otherwise and no open sores that she reports.  She has no other concerns.  Objective: AAO x3, NAD DP/PT pulses palpable bilaterally, CRT less than 3 seconds Chronic appearing edema to the right leg.  There is no open lesions.  There is no pain with calf compression. Nails are hypertrophic, dystrophic, brittle, discolored, elongated 10.  Mild ingrowing of the hallux toenails bilaterally.  No surrounding redness or drainage. Tenderness nails 1-5 bilaterally.  Hyperkeratotic lesion left fifth digit on the IPJ.  No underlying ulceration drainage or any signs of infection. No open lesions or pre-ulcerative lesions are identified today. No pain with calf compression, swelling, warmth, erythema  Assessment: 75 year old female with symptomatic onychomycosis, on Pradaxa  Plan: -All treatment options discussed with the patient including all alternatives, risks, complications.  -Sharp debrided nails x10 without any complications or bleeding. -Sharply debrided hyperkeratotic lesion x1 without any complications or bleeding. -Patient encouraged to call the office with any questions, concerns, change in symptoms.   Trula Slade DPM

## 2022-01-20 ENCOUNTER — Ambulatory Visit (HOSPITAL_COMMUNITY)
Admission: RE | Admit: 2022-01-20 | Discharge: 2022-01-20 | Disposition: A | Discharge status: Home / Self Care | Payer: Medicare Other | Source: Ambulatory Visit | Attending: Cardiovascular Disease | Admitting: Cardiovascular Disease

## 2022-01-20 ENCOUNTER — Other Ambulatory Visit: Payer: Self-pay

## 2022-01-20 DIAGNOSIS — I82461 Acute embolism and thrombosis of right calf muscular vein: Secondary | ICD-10-CM | POA: Insufficient documentation

## 2022-01-30 NOTE — Progress Notes (Signed)
Cardiology Office Note:   Date:  01/31/2022  NAME:  Summer Jackson    MRN: [id number] DOB:  Apr 20, 1946   PCP:  [facility], [place]  Cardiologist:  Evalina Field, MD  Electrophysiologist:  None   Referring MD: [place]*   Chief Complaint  Patient presents with   Follow-up        History of Present Illness:   Summer Jackson is a 76 y.o. female with a hx of HFpEF, HTN, HLD, venous insufficiency who presents for follow-up. Seen in October after R TKA. Had unilateral swelling and found to have R gastrocnemius DVT and started on xarelto.  She had issues with Eliquis and Xarelto.  She has now been on Pradaxa since 11/23/2021.  She has 1 more month of therapy.  Her DVT is related to surgery.  She reports her right knee is still swollen.  Weights are actually down.  She continues to take Lasix 20 mg daily.  I did inform her that this is not congestive heart failure.  She can take Lasix as needed.  Her BP is 108/60.  She reports she is not that active.  This is due to pain in the knee.  Again I have informed her that she needs to be evaluated by her surgeon.  She will do this.  Without other complaints in office today.  Problem List 1. HFpEF -negative MPI 11/13/2019 -EF 60-65%, G1DD 2. HTN 3. HLD 4. Carotid artery disease  -1-39% bilateral  5. COPD 6. Venous insufficiency 7. DVT -R gastrocnemius vein 10/19/2021 -after R TKA  Past Medical History: 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   Qualifier: Diagnosis of  By: Radene Ou MD, [place] DISEASE, 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   Annotation: 1957 Qualifier: History of  By: Radene Ou MD, [place]     Constipation 05/29/2013   COPD (chronic obstructive pulmonary disease) (HCC)    Exertional shortness of breath     Foreign body sensation in throat 12/16/2016   Gastroesophageal reflux disease with esophagitis 12/16/2016   GERD (gastroesophageal reflux disease)    Headache 03/20/2018   migraine Overview:  Overview:  migraine migraine   Headache(784.0)    migraine    Hemorrhoids    History of stomach ulcers 1990's   Hyperlipemia    HYPERLIPIDEMIA 08/25/2007   Qualifier: Diagnosis of  By: Radene Ou MD, [place]     Hypertension    Hypertension    HYPERTENSION 08/09/2007   Qualifier: Diagnosis of  By: Radene Ou MD, [place]     Idiopathic peripheral autonomic neuropathy 03/27/2019   Left lower quadrant pain 09/05/2018   Left shoulder pain 02/05/2013   Lipoma of abdominal wall 01/23/2012   Migraines    Nasal congestion    Nicotine dependence 11/23/2018   Pharyngeal dysphagia 12/16/2016   Prediabetes 01/10/2013   Rectal bleeding    Scapular dyskinesis 03/26/2018   SHINGLES, RECURRENT 09/30/2008   Qualifier: Diagnosis of  By: Radene Ou MD, [place]     Spondylolisthesis, lumbar region 04/10/2019   Added automatically from request for surgery [id number]   Sternal pain 03/26/2018   Synovial cyst of lumbar facet joint 04/08/2019   Urinary frequency    Weight loss    Wheezing  Past Surgical History: Past Surgical History:  Procedure Laterality Date   APPENDECTOMY  1970's   BACK SURGERY  04/2019   COLONOSCOPY     COLONOSCOPY  12/22/2011   Procedure: COLONOSCOPY;  Surgeon: Lear Ng, MD;  Location: [facility];  Service: Endoscopy;  Laterality: N/A;   heart surgery at 17yr  1955   "blood was going thru my lungs but not thru my heart like it should" (05/09/2013)   KNEE ARTHROSCOPY Left ~ 2004; ~ 2006   LEFT OOPHORECTOMY Left ~ [place] [facility]  02/15/2012   Procedure: EXCISION LIPOMA;  Surgeon: Joyice Faster. Cornett, MD;  Location: [facility];  Service: General;  Laterality: Left;  excision lipoma Left Lower abdomen   TOTAL KNEE ARTHROPLASTY Left 05/08/2013   TOTAL KNEE ARTHROPLASTY Left  05/08/2013   Procedure: TOTAL KNEE ARTHROPLASTY;  Surgeon: Ninetta Lights, MD;  Location: [facility];  Service: Orthopedics;  Laterality: Left;   TOTAL KNEE ARTHROPLASTY Right 09/13/2021   Procedure: TOTAL KNEE ARTHROPLASTY;  Surgeon: Gaynelle Arabian, MD;  Location: [facility];  Service: Orthopedics;  Laterality: Right;   TUBAL LIGATION Left ~ 1985   TUMOR EXCISION Left ~ 1984   "under breast" (05/09/2013)    Current Medications: Current Meds  Medication Sig   albuterol (PROVENTIL) (2.5 MG/3ML) 0.083% nebulizer solution Take 3 mLs (2.5 mg total) by nebulization every 6 (six) hours as needed for wheezing or shortness of breath.   albuterol (VENTOLIN HFA) 108 (90 Base) MCG/ACT inhaler Can inhale two puffs every four to six hours as needed for cough or wheeze. (Patient taking differently: Inhale 2 puffs into the lungs every 4 (four) hours as needed for wheezing (cough).)   Cholecalciferol ([employer] D3) 50 MCG (2000 UT) CAPS Take 2,000 Units by mouth daily.   fluticasone (FLONASE) 50 MCG/ACT nasal spray Place 1-2 sprays into both nostrils daily as needed for allergies.   folic acid (FOLVITE) 176 MCG tablet Take 400 mcg by mouth daily.   lisinopril (ZESTRIL) 20 MG tablet Take 1 tablet (20 mg total) by mouth daily.   methocarbamol (ROBAXIN) 500 MG tablet Take 1 tablet (500 mg total) by mouth every 6 (six) hours as needed for muscle spasms.   omeprazole (PRILOSEC) 40 MG capsule Take 40 mg by mouth daily.   pyridOXINE (VITAMIN B-6) 100 MG tablet Take 100 mg by mouth daily.   simvastatin (ZOCOR) 20 MG tablet Take 20 mg by mouth at bedtime.   sucralfate (CARAFATE) 1 g tablet Take 1 g by mouth 3 (three) times daily.   vitamin B-12 (CYANOCOBALAMIN) 500 MCG tablet Take 500 mcg by mouth daily.   zinc gluconate 50 MG tablet Take 50 mg by mouth daily.   [DISCONTINUED] dabigatran (PRADAXA) 150 MG CAPS capsule Take 1 capsule (150 mg total) by mouth 2 (two) times daily.   [DISCONTINUED] furosemide (LASIX) 20 MG tablet Take 1  tablet (20 mg total) by mouth daily.   [DISCONTINUED] oxyCODONE (ROXICODONE) 5 MG immediate release tablet Take 1 tablet (5 mg total) by mouth every 4 (four) hours as needed for severe pain.     Allergies:    Sulfa antibiotics, Xarelto [rivaroxaban], Amoxicillin, Azithromycin, Clindamycin/lincomycin, Erythromycin, Penicillins, Propoxyphene n-acetaminophen, and Clarithromycin   Social History: Social History   Socioeconomic History   Marital status: Divorced    Spouse name: Not on file   Number of children: 2   Years of education: Not on file   Highest education level: Not on file  Occupational  History   Occupation: cleaner    Comment: cleans apts and homes  Tobacco Use   Smoking status: Former    Packs/day: 2.00    Years: 35.00    Pack years: 70.00    Types: Cigarettes    Quit date: 08/20/2013    Years since quitting: 8.4   Smokeless tobacco: Never   Tobacco comments:    Currently exposed to 2nd hand smoke in home   Vaping Use   Vaping Use: Never used  Substance and Sexual Activity   Alcohol use: No   Drug use: No   Sexual activity: Not on file  Other Topics Concern   Not on file  Social History Narrative   Lives with her daughter's father.   Social Determinants of Health   Financial Resource Strain: Not on file  Food Insecurity: Not on file  Transportation Needs: Not on file  Physical Activity: Not on file  Stress: Not on file  Social Connections: Not on file     Family History: The patient's family history includes Cancer in her father; Colon cancer in her father; Diabetes in her sister; Heart disease in her father and paternal uncle. There is no history of Malignant hyperthermia.  ROS:   All other ROS reviewed and negative. Pertinent positives noted in the HPI.     EKGs/Labs/Other Studies Reviewed:   The following studies were personally reviewed by me today:  Recent Labs: 09/13/2021: ALT 24 10/19/2021: BUN 15; Creatinine, Ser 0.62; Hemoglobin 10.9;  Platelets 289; Potassium 4.2; Sodium 138   Recent Lipid Panel    Component Value Date/Time   CHOL 277 (H) 04/30/2008 2259   TRIG 154 (H) 04/30/2008 2259   HDL 55 04/30/2008 2259   CHOLHDL 5.0 Ratio 04/30/2008 2259   VLDL 31 04/30/2008 2259   LDLCALC 191 (H) 04/30/2008 2259    Physical Exam:   VS:  BP 108/60    Pulse 89    Ht 5\' 1"  (1.549 m)    Wt 160 lb (72.6 kg)    SpO2 98%    BMI 30.23 kg/m    Wt Readings from Last 3 Encounters:  01/31/22 160 lb (72.6 kg)  10/19/21 167 lb (75.8 kg)  10/19/21 167 lb (75.8 kg)    General: Well nourished, well developed, in no acute distress Head: Atraumatic, normal size  Eyes: PEERLA, EOMI  Neck: Supple, no JVD Endocrine: No thryomegaly Cardiac: Normal S1, S2; RRR; no murmurs, rubs, or gallops Lungs: Clear to auscultation bilaterally, no wheezing, rhonchi or rales  Abd: Soft, nontender, no hepatomegaly  Ext: Right knee swollen and tender to palpation, trace edema in the right lower extremity Musculoskeletal: No deformities, BUE and BLE strength normal and equal Skin: Warm and dry, no rashes   Neuro: Alert and oriented to person, place, time, and situation, CNII-XII grossly intact, no focal deficits  Psych: Normal mood and affect   ASSESSMENT:   Summer Jackson is a 76 y.o. female who presents for the following: 1. Acute deep vein thrombosis (DVT) of calf muscle vein of right lower extremity (HCC)   2. Chronic diastolic heart failure (HCC)   3. Venous insufficiency   4. Mixed hyperlipidemia   5. Primary hypertension    PLAN:   1. Acute deep vein thrombosis (DVT) of calf muscle vein of right lower extremity ([place]) -Diagnosed with a right gastrocnemius vein DVT on 10/19/2021.  This was after right total knee arthroplasty.  Had issues on Eliquis and Xarelto.  Currently on Pradaxa.  She will complete 1 more month of therapy.  This is a provoked DVT.  No need for extended therapy. -Continue to have swelling in her right knee.  I recommended she  see her surgeon.  She has not seen them since her diagnosis of DVT.  2. Chronic diastolic heart failure (HCC) -Euvolemic on examination.  She can take Lasix 20 mg daily as needed.  3. Venous insufficiency -Leg elevation recommended.  Lasix as needed.  4. Mixed hyperlipidemia -Continue statin.  5. Primary hypertension -BP well controlled.  No change to medications.  Disposition: Return in about 1 year (around 01/31/2023).  Medication Adjustments/Labs and Tests Ordered: Current medicines are reviewed at length with the patient today.  Concerns regarding medicines are outlined above.  No orders of the defined types were placed in this encounter.  Meds ordered this encounter  Medications   dabigatran (PRADAXA) 150 MG CAPS capsule    Sig: Take 1 capsule (150 mg total) by mouth 2 (two) times daily.    Dispense:  60 capsule    Refill:  0   furosemide (LASIX) 20 MG tablet    Sig: Take 1 tablet (20 mg total) by mouth daily as needed.    Dispense:  90 tablet    Refill:  1    Patient Instructions  Medication Instructions:  Take Pradaxa for one more month- stop on 02/21/2022 Take Lasix daily as needed  *If you need a refill on your cardiac medications before your next appointment, please call your pharmacy*   Follow-Up: At [facility], you and your health needs are our priority.  As part of our continuing mission to provide you with exceptional heart care, we have created designated Provider Care Teams.  These Care Teams include your primary Cardiologist (physician) and Advanced Practice Providers (APPs -  Physician Assistants and Nurse Practitioners) who all work together to provide you with the care you need, when you need it.  We recommend signing up for the patient portal called "MyChart".  Sign up information is provided on this After Visit Summary.  MyChart is used to connect with patients for Virtual Visits (Telemedicine).  Patients are able to view lab/test results, encounter  notes, upcoming appointments, etc.  Non-urgent messages can be sent to your provider as well.   To learn more about what you can do with MyChart, go to [email or web address].    Your next appointment:   12 month(s)  The format for your next appointment:   In Person  Provider:   Evalina Field, MD        Time Spent with Patient: I have spent a total of 35 minutes with patient reviewing hospital notes, telemetry, EKGs, labs and examining the patient as well as establishing an assessment and plan that was discussed with the patient.  > 50% of time was spent in direct patient care.  Signed, Addison Naegeli. Audie Box, MD, [place]  [address], [place],  [address] [phone]  01/31/2022 3:52 PM

## 2022-01-31 ENCOUNTER — Ambulatory Visit (INDEPENDENT_AMBULATORY_CARE_PROVIDER_SITE_OTHER): Payer: Medicare Other | Admitting: Cardiovascular Disease

## 2022-01-31 ENCOUNTER — Other Ambulatory Visit: Payer: Self-pay

## 2022-01-31 ENCOUNTER — Encounter: Payer: Self-pay | Admitting: Cardiovascular Disease

## 2022-01-31 VITALS — BP 108/60 | HR 89 | Ht 61.0 in | Wt 160.0 lb

## 2022-01-31 DIAGNOSIS — I10 Essential (primary) hypertension: Secondary | ICD-10-CM

## 2022-01-31 DIAGNOSIS — E782 Mixed hyperlipidemia: Secondary | ICD-10-CM | POA: Diagnosis not present

## 2022-01-31 DIAGNOSIS — I872 Venous insufficiency (chronic) (peripheral): Secondary | ICD-10-CM

## 2022-01-31 DIAGNOSIS — I5032 Chronic diastolic (congestive) heart failure: Secondary | ICD-10-CM

## 2022-01-31 DIAGNOSIS — I82461 Acute embolism and thrombosis of right calf muscular vein: Secondary | ICD-10-CM

## 2022-01-31 MED ORDER — FUROSEMIDE 20 MG PO TABS: 20.0000 mg | ORAL_TABLET | Freq: Every day | ORAL | 1 refills | Status: AC | PRN

## 2022-01-31 MED ORDER — DABIGATRAN ETEXILATE MESYLATE 150 MG PO CAPS: 150.0000 mg | ORAL_CAPSULE | Freq: Two times a day (BID) | ORAL | 0 refills | Status: DC

## 2022-01-31 NOTE — Patient Instructions (Signed)
Medication Instructions:  Take Pradaxa for one more month- stop on 02/21/2022 Take Lasix daily as needed  *If you need a refill on your cardiac medications before your next appointment, please call your pharmacy*   Follow-Up: At [facility], you and your health needs are our priority.  As part of our continuing mission to provide you with exceptional heart care, we have created designated Provider Care Teams.  These Care Teams include your primary Cardiologist (physician) and Advanced Practice Providers (APPs -  Physician Assistants and Nurse Practitioners) who all work together to provide you with the care you need, when you need it.  We recommend signing up for the patient portal called "MyChart".  Sign up information is provided on this After Visit Summary.  MyChart is used to connect with patients for Virtual Visits (Telemedicine).  Patients are able to view lab/test results, encounter notes, upcoming appointments, etc.  Non-urgent messages can be sent to your provider as well.   To learn more about what you can do with MyChart, go to [email or web address].    Your next appointment:   12 month(s)  The format for your next appointment:   In Person  Provider:   Evalina Field, MD

## 2022-02-01 ENCOUNTER — Ambulatory Visit (HOSPITAL_COMMUNITY)
Admission: RE | Admit: 2022-02-01 | Discharge: 2022-02-01 | Disposition: A | Discharge status: Home / Self Care | Payer: Medicare Other | Source: Ambulatory Visit | Attending: Cardiology | Admitting: Cardiology

## 2022-02-01 ENCOUNTER — Other Ambulatory Visit (HOSPITAL_COMMUNITY): Payer: Self-pay | Admitting: Medical

## 2022-02-01 DIAGNOSIS — M7989 Other specified soft tissue disorders: Secondary | ICD-10-CM | POA: Diagnosis present

## 2022-02-24 DIAGNOSIS — Z86718 Personal history of other venous thrombosis and embolism: Secondary | ICD-10-CM

## 2022-02-28 ENCOUNTER — Other Ambulatory Visit: Payer: Self-pay | Admitting: Cardiovascular Disease

## 2022-03-10 ENCOUNTER — Ambulatory Visit (INDEPENDENT_AMBULATORY_CARE_PROVIDER_SITE_OTHER): Payer: Medicare Other | Admitting: Podiatry

## 2022-03-10 ENCOUNTER — Other Ambulatory Visit: Payer: Self-pay

## 2022-03-10 DIAGNOSIS — B351 Tinea unguium: Secondary | ICD-10-CM | POA: Diagnosis not present

## 2022-03-10 DIAGNOSIS — M79674 Pain in right toe(s): Secondary | ICD-10-CM

## 2022-03-10 DIAGNOSIS — M79675 Pain in left toe(s): Secondary | ICD-10-CM | POA: Diagnosis not present

## 2022-03-10 MED ORDER — CICLOPIROX 8 % EX SOLN: Freq: Every day | CUTANEOUS | 2 refills | Status: DC

## 2022-03-12 NOTE — Progress Notes (Signed)
Subjective: ?76 year old female presents the office today for concerns of thick, elongated toenails that she cannot trim herself.  She denies any ingrown toenails.  Denies any swelling or redness or drainage to the toenail sites the nails are causing discomfort.  Denies any open lesions.  She is no longer taking Pradaxa. ? ?Objective: ?AAO x3, NAD ?DP/PT pulses palpable bilaterally, CRT less than 3 seconds ?Chronic appearing edema to the right leg.  There is no open lesions.  There is no pain with calf compression. ?Nails are hypertrophic, dystrophic, brittle, discolored, elongated ?10.  Mild ingrowing of the hallux toenails bilaterally.  No surrounding redness or drainage. Tenderness nails 1-5 bilaterally.  ?No open lesions or pre-ulcerative lesions are identified today. ?No pain with calf compression, swelling, warmth, erythema ? ?Assessment: ?76 year old female with symptomatic onychomycosis ? ?Plan: ?-All treatment options discussed with the patient including all alternatives, risks, complications.  ?-Sharp debrided nails x10 without any complications or bleeding. ?-Refill Penlac for nail fungus. ?-Patient encouraged to call the office with any questions, concerns, change in symptoms.  ? ?Trula Slade DPM ? ?

## 2022-06-13 ENCOUNTER — Encounter: Payer: Self-pay | Admitting: Podiatry

## 2022-06-13 ENCOUNTER — Ambulatory Visit (INDEPENDENT_AMBULATORY_CARE_PROVIDER_SITE_OTHER): Payer: Medicare Other | Admitting: Podiatry

## 2022-06-13 DIAGNOSIS — B351 Tinea unguium: Secondary | ICD-10-CM | POA: Diagnosis not present

## 2022-06-13 DIAGNOSIS — M79674 Pain in right toe(s): Secondary | ICD-10-CM

## 2022-06-13 DIAGNOSIS — M79675 Pain in left toe(s): Secondary | ICD-10-CM

## 2022-06-13 DIAGNOSIS — Z7901 Long term (current) use of anticoagulants: Secondary | ICD-10-CM

## 2022-07-20 ENCOUNTER — Encounter (HOSPITAL_COMMUNITY): Payer: Self-pay | Admitting: Family Medicine

## 2022-07-20 ENCOUNTER — Other Ambulatory Visit: Payer: Self-pay

## 2022-07-20 ENCOUNTER — Inpatient Hospital Stay (HOSPITAL_COMMUNITY)
Admit: 2022-07-20 | Discharge: 2022-07-27 | DRG: 300 | Disposition: A | Discharge status: Home / Self Care | Payer: Medicare Other | Source: Ambulatory Visit | Attending: Family Medicine | Admitting: Family Medicine

## 2022-07-20 ENCOUNTER — Encounter (HOSPITAL_COMMUNITY): Payer: Self-pay

## 2022-07-20 DIAGNOSIS — I5032 Chronic diastolic (congestive) heart failure: Secondary | ICD-10-CM | POA: Diagnosis present

## 2022-07-20 DIAGNOSIS — Z6833 Body mass index (BMI) 33.0-33.9, adult: Secondary | ICD-10-CM

## 2022-07-20 DIAGNOSIS — R7303 Prediabetes: Secondary | ICD-10-CM | POA: Diagnosis present

## 2022-07-20 DIAGNOSIS — I110 Hypertensive heart disease with heart failure: Secondary | ICD-10-CM | POA: Diagnosis present

## 2022-07-20 DIAGNOSIS — G43009 Migraine without aura, not intractable, without status migrainosus: Secondary | ICD-10-CM | POA: Diagnosis present

## 2022-07-20 DIAGNOSIS — Z833 Family history of diabetes mellitus: Secondary | ICD-10-CM

## 2022-07-20 DIAGNOSIS — Z882 Allergy status to sulfonamides status: Secondary | ICD-10-CM

## 2022-07-20 DIAGNOSIS — Z8249 Family history of ischemic heart disease and other diseases of the circulatory system: Secondary | ICD-10-CM

## 2022-07-20 DIAGNOSIS — L0390 Cellulitis, unspecified: Principal | ICD-10-CM | POA: Diagnosis present

## 2022-07-20 DIAGNOSIS — D631 Anemia in chronic kidney disease: Secondary | ICD-10-CM | POA: Insufficient documentation

## 2022-07-20 DIAGNOSIS — D509 Iron deficiency anemia, unspecified: Secondary | ICD-10-CM | POA: Diagnosis present

## 2022-07-20 DIAGNOSIS — E119 Type 2 diabetes mellitus without complications: Secondary | ICD-10-CM | POA: Insufficient documentation

## 2022-07-20 DIAGNOSIS — L03115 Cellulitis of right lower limb: Secondary | ICD-10-CM | POA: Diagnosis not present

## 2022-07-20 DIAGNOSIS — Z96653 Presence of artificial knee joint, bilateral: Secondary | ICD-10-CM | POA: Diagnosis present

## 2022-07-20 DIAGNOSIS — E669 Obesity, unspecified: Secondary | ICD-10-CM | POA: Diagnosis present

## 2022-07-20 DIAGNOSIS — R519 Headache, unspecified: Secondary | ICD-10-CM | POA: Diagnosis present

## 2022-07-20 DIAGNOSIS — K219 Gastro-esophageal reflux disease without esophagitis: Secondary | ICD-10-CM | POA: Diagnosis present

## 2022-07-20 DIAGNOSIS — Z881 Allergy status to other antibiotic agents status: Secondary | ICD-10-CM

## 2022-07-20 DIAGNOSIS — Z800 Family history of malignant neoplasm of digestive organs: Secondary | ICD-10-CM

## 2022-07-20 DIAGNOSIS — E785 Hyperlipidemia, unspecified: Secondary | ICD-10-CM | POA: Diagnosis present

## 2022-07-20 DIAGNOSIS — I10 Essential (primary) hypertension: Secondary | ICD-10-CM | POA: Diagnosis present

## 2022-07-20 DIAGNOSIS — Z880 Allergy status to penicillin: Secondary | ICD-10-CM

## 2022-07-20 DIAGNOSIS — Z86718 Personal history of other venous thrombosis and embolism: Secondary | ICD-10-CM

## 2022-07-20 DIAGNOSIS — Z886 Allergy status to analgesic agent status: Secondary | ICD-10-CM

## 2022-07-20 DIAGNOSIS — K2100 Gastro-esophageal reflux disease with esophagitis, without bleeding: Secondary | ICD-10-CM | POA: Diagnosis present

## 2022-07-20 DIAGNOSIS — G9009 Other idiopathic peripheral autonomic neuropathy: Secondary | ICD-10-CM | POA: Diagnosis present

## 2022-07-20 DIAGNOSIS — J449 Chronic obstructive pulmonary disease, unspecified: Secondary | ICD-10-CM | POA: Diagnosis present

## 2022-07-20 DIAGNOSIS — Z7902 Long term (current) use of antithrombotics/antiplatelets: Secondary | ICD-10-CM

## 2022-07-20 DIAGNOSIS — D649 Anemia, unspecified: Secondary | ICD-10-CM

## 2022-07-20 DIAGNOSIS — I82401 Acute embolism and thrombosis of unspecified deep veins of right lower extremity: Secondary | ICD-10-CM | POA: Diagnosis present

## 2022-07-20 DIAGNOSIS — Z87891 Personal history of nicotine dependence: Secondary | ICD-10-CM

## 2022-07-20 DIAGNOSIS — N189 Chronic kidney disease, unspecified: Secondary | ICD-10-CM | POA: Diagnosis present

## 2022-07-20 DIAGNOSIS — I96 Gangrene, not elsewhere classified: Principal | ICD-10-CM | POA: Diagnosis present

## 2022-07-20 DIAGNOSIS — Z888 Allergy status to other drugs, medicaments and biological substances status: Secondary | ICD-10-CM

## 2022-07-20 DIAGNOSIS — Z79899 Other long term (current) drug therapy: Secondary | ICD-10-CM

## 2022-07-20 DIAGNOSIS — I714 Abdominal aortic aneurysm, without rupture, unspecified: Secondary | ICD-10-CM | POA: Insufficient documentation

## 2022-07-20 LAB — LACTIC ACID, PLASMA
Lactic Acid, Venous: 1.4 mmol/L (ref 0.5–1.9)
Lactic Acid, Venous: 1.4 mmol/L (ref 0.5–1.9)

## 2022-07-20 LAB — COMPREHENSIVE METABOLIC PANEL
ALT: 16 U/L (ref 0–44)
AST: 17 U/L (ref 15–41)
Albumin: 3.5 g/dL (ref 3.5–5.0)
Alkaline Phosphatase: 58 U/L (ref 38–126)
Anion gap: 8 (ref 5–15)
BUN: 22 mg/dL (ref 8–23)
CO2: 25 mmol/L (ref 22–32)
Calcium: 8.9 mg/dL (ref 8.9–10.3)
Chloride: 102 mmol/L (ref 98–111)
Creatinine, Ser: 0.74 mg/dL (ref 0.44–1.00)
GFR, Estimated: >60 mL/min (ref >60)
Glucose, Bld: 107 mg/dL — ABNORMAL HIGH (ref 70–99)
Potassium: 3.9 mmol/L (ref 3.5–5.1)
Sodium: 135 mmol/L (ref 135–145)
Total Bilirubin: 0.5 mg/dL (ref 0.3–1.2)
Total Protein: 6.1 g/dL — ABNORMAL LOW (ref 6.5–8.1)

## 2022-07-20 LAB — HEMOGLOBIN A1C
Hgb A1c MFr Bld: 5.6 % (ref 4.8–5.6)
Mean Plasma Glucose: 114.02 mg/dL

## 2022-07-20 LAB — CBC
HCT: 35.8 % — ABNORMAL LOW (ref 36.0–46.0)
Hemoglobin: 11.5 g/dL — ABNORMAL LOW (ref 12.0–15.0)
MCH: 28.2 pg (ref 26.0–34.0)
MCHC: 32.1 g/dL (ref 30.0–36.0)
MCV: 87.7 fL (ref 80.0–100.0)
Platelets: 276 10*3/uL (ref 150–400)
RBC: 4.08 MIL/uL (ref 3.87–5.11)
RDW: 13.6 % (ref 11.5–15.5)
WBC: 8.4 10*3/uL (ref 4.0–10.5)
nRBC: 0 % (ref 0.0–0.2)

## 2022-07-20 MED ORDER — SODIUM CHLORIDE 0.9 % IV SOLN
1.0000 g | INTRAVENOUS | Status: AC
  Administered 2022-07-20 – 2022-07-26 (×7): 1 g via INTRAVENOUS
  Filled 2022-07-20 (×7): qty 10

## 2022-07-20 MED ORDER — ONDANSETRON HCL 4 MG PO TABS: 4.0000 mg | ORAL_TABLET | Freq: Four times a day (QID) | ORAL | Status: DC | PRN

## 2022-07-20 MED ORDER — INSULIN ASPART 100 UNIT/ML IJ SOLN: 0.0000 [IU] | Freq: Three times a day (TID) | INTRAMUSCULAR | Status: DC

## 2022-07-20 MED ORDER — ONDANSETRON HCL 4 MG/2ML IJ SOLN: 4.0000 mg | Freq: Four times a day (QID) | INTRAMUSCULAR | Status: DC | PRN

## 2022-07-20 MED ORDER — ASPIRIN 81 MG PO TBEC
81.0000 mg | DELAYED_RELEASE_TABLET | Freq: Every morning | ORAL | Status: DC
  Administered 2022-07-21 – 2022-07-27 (×7): 81 mg via ORAL
  Filled 2022-07-20 (×7): qty 1

## 2022-07-20 MED ORDER — SODIUM CHLORIDE 0.9% FLUSH: 3.0000 mL | INTRAVENOUS | Status: DC | PRN

## 2022-07-20 MED ORDER — ACETAMINOPHEN 650 MG RE SUPP: 650.0000 mg | Freq: Four times a day (QID) | RECTAL | Status: DC | PRN

## 2022-07-20 MED ORDER — SODIUM CHLORIDE 0.9 % IV SOLN: 250.0000 mL | INTRAVENOUS | Status: DC | PRN

## 2022-07-20 MED ORDER — FLUTICASONE FUROATE-VILANTEROL 200-25 MCG/ACT IN AEPB
1.0000 | INHALATION_SPRAY | Freq: Every day | RESPIRATORY_TRACT | Status: DC | PRN
Start: 2022-07-20 — End: 2022-07-21

## 2022-07-20 MED ORDER — ACETAMINOPHEN 325 MG PO TABS
650.0000 mg | ORAL_TABLET | Freq: Four times a day (QID) | ORAL | Status: DC | PRN
  Administered 2022-07-26 – 2022-07-27 (×2): 650 mg via ORAL
  Filled 2022-07-20 (×2): qty 2

## 2022-07-20 MED ORDER — PANTOPRAZOLE SODIUM 40 MG PO TBEC
40.0000 mg | DELAYED_RELEASE_TABLET | Freq: Every day | ORAL | Status: DC
  Administered 2022-07-21 – 2022-07-27 (×7): 40 mg via ORAL
  Filled 2022-07-20 (×7): qty 1

## 2022-07-20 MED ORDER — FLUTICASONE PROPIONATE 50 MCG/ACT NA SUSP
1.0000 | Freq: Every day | NASAL | Status: DC | PRN
  Administered 2022-07-20: 1 via NASAL
  Filled 2022-07-20: qty 16

## 2022-07-20 MED ORDER — HYDROCODONE-ACETAMINOPHEN 5-325 MG PO TABS
1.0000 | ORAL_TABLET | ORAL | Status: DC | PRN
  Administered 2022-07-20 – 2022-07-21 (×3): 1 via ORAL
  Administered 2022-07-21 – 2022-07-23 (×5): 2 via ORAL
  Administered 2022-07-24: 1 via ORAL
  Administered 2022-07-24: 2 via ORAL
  Administered 2022-07-25: 1 via ORAL
  Administered 2022-07-26 (×3): 2 via ORAL
  Filled 2022-07-20: qty 2
  Filled 2022-07-20: qty 1
  Filled 2022-07-20 (×5): qty 2
  Filled 2022-07-20: qty 1
  Filled 2022-07-20 (×3): qty 2
  Filled 2022-07-20: qty 1
  Filled 2022-07-20: qty 2
  Filled 2022-07-20 (×2): qty 1

## 2022-07-20 MED ORDER — LISINOPRIL 20 MG PO TABS
20.0000 mg | ORAL_TABLET | Freq: Every day | ORAL | Status: DC
  Administered 2022-07-21 – 2022-07-27 (×7): 20 mg via ORAL
  Filled 2022-07-20 (×6): qty 1

## 2022-07-20 MED ORDER — ALBUTEROL SULFATE (2.5 MG/3ML) 0.083% IN NEBU: 3.0000 mL | INHALATION_SOLUTION | RESPIRATORY_TRACT | Status: DC | PRN

## 2022-07-20 MED ORDER — SODIUM CHLORIDE 0.9% FLUSH
3.0000 mL | Freq: Two times a day (BID) | INTRAVENOUS | Status: DC
  Administered 2022-07-21 – 2022-07-27 (×12): 3 mL via INTRAVENOUS

## 2022-07-20 NOTE — H&P (Signed)
History and Physical    Patient: Summer Jackson DOB: 26-Mar-1946 DOA: 07/20/2022 DOS: the patient was seen and examined on 07/20/2022 PCP: Imagene Riches, NP  Patient coming from: Home - lives with daughter, uses walker/cane at home if she needs it. Direct admit from Dr. Nona Dell office.    Chief Complaint: cellulitis   HPI: Summer Jackson is a 76 y.o. female with medical history significant of migraine, COPD, GERD, HLD, HTN, HFpEF who was sent from Dr. Nona Dell office for cellulitis of her leg that has failed outpatient antibiotics. She states on Saturday she hit her right leg on the corner of her door while getting gas. She went to urgent care and was stitched up. She states they did not clean it out. She went back Sunday and was given a prescription for a "zpack." She states the redness/swelling and pain continued to get worse. She saw Dr. Doran Durand on Monday and he changed her to doxycycline. Saw her back in office today and had concerns about worsening cellulitis with some black color on top skin. She denies any fever/chills or drainage from the wound. Pain has worsened from wound all the way up to her hip. She has pain when she stands up and swelling in her right lower leg. She has had 2 episodes of vomiting last night and one time today.   Denies any fever/chills, vision changes, +headaches, chest pain or palpitations, shortness of breath or cough, abdominal pain, diarrhea, dysuria   She does not smoke or drink alcohol.    Review of Systems: As mentioned in the history of present illness. All other systems reviewed and are negative. 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   Qualifier: Diagnosis of  By: Radene Ou MD, [place] DISEASE, 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   Annotation:  1957 Qualifier: History of  By: Radene Ou MD, [place]     Constipation 05/29/2013   COPD (chronic obstructive pulmonary disease) (HCC)    Exertional shortness of breath    Foreign body sensation in throat 12/16/2016   Gastroesophageal reflux disease with esophagitis 12/16/2016   GERD (gastroesophageal reflux disease)    Headache 03/20/2018   migraine Overview:  Overview:  migraine migraine   Headache(784.0)    migraine    Hemorrhoids    History of stomach ulcers 1990's   Hyperlipemia    HYPERLIPIDEMIA 08/25/2007   Qualifier: Diagnosis of  By: Radene Ou MD, [place]     Hypertension    Hypertension    HYPERTENSION 08/09/2007   Qualifier: Diagnosis of  By: Radene Ou MD, [place]     Idiopathic peripheral autonomic neuropathy 03/27/2019   Left lower quadrant pain 09/05/2018   Left shoulder pain 02/05/2013   Lipoma of abdominal wall 01/23/2012   Migraines    Nasal congestion    Nicotine dependence 11/23/2018   Pharyngeal dysphagia 12/16/2016   Prediabetes 01/10/2013   Rectal bleeding    Scapular dyskinesis 03/26/2018   SHINGLES, RECURRENT 09/30/2008   Qualifier: Diagnosis of  By: Radene Ou MD, [place]     Spondylolisthesis, lumbar region 04/10/2019   Added automatically from request for surgery [id number]   Sternal pain 03/26/2018   Synovial cyst of lumbar facet joint 04/08/2019   Urinary frequency    Weight loss  Wheezing    Past Surgical History:  Procedure Laterality Date   APPENDECTOMY  1970's   BACK SURGERY  04/2019   COLONOSCOPY     COLONOSCOPY  12/22/2011   Procedure: COLONOSCOPY;  Surgeon: Lear Ng, MD;  Location: [facility];  Service: Endoscopy;  Laterality: N/A;   heart surgery at 32yr 1955   "blood was going thru my lungs but not thru my heart like it should" (05/09/2013)   KNEE ARTHROSCOPY Left ~ 2004; ~ 2006   LEFT OOPHORECTOMY Left ~ [place] 02/15/2012   Procedure: EXCISION LIPOMA;  Surgeon: TJoyice Faster Cornett, MD;  [facility]   Service: General;  Laterality: Left;  excision lipoma Left Lower abdomen   TOTAL KNEE ARTHROPLASTY Left 05/08/2013   TOTAL KNEE ARTHROPLASTY Left 05/08/2013   Procedure: TOTAL KNEE ARTHROPLASTY;  Surgeon: DNinetta Lights MD;  Location: [facility]  Service: Orthopedics;  Laterality: Left;   TOTAL KNEE ARTHROPLASTY Right 09/13/2021   Procedure: TOTAL KNEE ARTHROPLASTY;  Surgeon: AGaynelle Arabian MD;  Location: [facility];  Service: Orthopedics;  Laterality: Right;   T[place] LIGATION Left [place]   "under breast" (05/09/2013)   Social History:  reports that she quit smoking about 8 years ago. Her smoking use included cigarettes. She has a 70.00 pack-year smoking history. She has never used smokeless tobacco. She reports that she does not drink alcohol and does not use drugs.  Allergies  Allergen Reactions   Sulfa Antibiotics Swelling and Rash   Xarelto [Rivaroxaban] Hives and Itching   Amoxicillin Other (See Comments)    REACTION: headache   Azithromycin Other (See Comments)    REACTION: reddness around mouth   Clindamycin/Lincomycin Hives   Erythromycin Hives   Penicillins Other (See Comments)    REACTION: headache Has patient had a PCN reaction causing immediate rash, facial/tongue/throat swelling, SOB or lightheadedness with hypotension: No Has patient had a PCN reaction causing severe rash involving mucus membranes or skin necrosis: No Has patient had a PCN reaction that required hospitalization No Has patient had a PCN reaction occurring within the last 10 years: No If all of the above answers are NO, then may proceed with Cephalosporin use.  Tolerated Cephalosporin Date: 09/14/21.   Propoxyphene N-Acetaminophen Other (See Comments)    REACTION: mouth sores   Clarithromycin Rash    REACTION: skin rash with minimal vesicular formation on arms and hands    Family History  Problem Relation Age of Onset   Colon cancer Father    Cancer Father        colon   Heart  disease Father    Heart disease Paternal Uncle    Diabetes Sister    Malignant hyperthermia Neg Hx     Prior to Admission medications   Medication Sig Start Date End Date Taking? Authorizing Provider  albuterol (PROVENTIL) (2.5 MG/3ML) 0.083% nebulizer solution Take 3 mLs (2.5 mg total) by nebulization every 6 (six) hours as needed for wheezing or shortness of breath. 10/04/14   Piepenbrink, JAnderson Malta PA-C  albuterol (VENTOLIN HFA) 108 (90 Base) MCG/ACT inhaler Can inhale two puffs every four to six hours as needed for cough or wheeze. Patient taking differently: Inhale 2 puffs into the lungs every 4 (four) hours as needed for wheezing (cough). 12/03/19   PKennith Gain MD  Cholecalciferol ([employer] D3) 50 MCG (2000 UT) CAPS Take 2,000 Units by mouth daily.    [provider]  ciclopirox (PENLAC) 8 % solution Apply topically at bedtime. Apply over nail and surrounding skin. Apply daily over previous coat. After seven (7) days, may remove with alcohol and continue cycle. 03/10/22   Trula Slade, DPM  dabigatran (PRADAXA) 150 MG CAPS capsule Take 1 capsule (150 mg total) by mouth 2 (two) times daily. 01/31/22   Geralynn Rile, MD  fluticasone (FLONASE) 50 MCG/ACT nasal spray Place 1-2 sprays into both nostrils daily as needed for allergies. 12/03/19   [provider]  folic acid (FOLVITE) 387 MCG tablet Take 400 mcg by mouth daily.    [provider]  furosemide (LASIX) 20 MG tablet Take 1 tablet (20 mg total) by mouth daily as needed. 01/31/22   O'NealCassie Freer, MD  lisinopril (ZESTRIL) 20 MG tablet Take 1 tablet by mouth once daily 02/28/22   O'Neal, Cassie Freer, MD  methocarbamol (ROBAXIN) 500 MG tablet Take 1 tablet (500 mg total) by mouth every 6 (six) hours as needed for muscle spasms. 09/14/21   Edmisten, Ok Anis, PA  omeprazole (PRILOSEC) 40 MG capsule Take 40 mg by mouth daily. 11/06/19   [provider]  pyridOXINE (VITAMIN B-6)  100 MG tablet Take 100 mg by mouth daily.    [provider]  simvastatin (ZOCOR) 20 MG tablet Take 20 mg by mouth at bedtime. 09/02/19   [provider]  sucralfate (CARAFATE) 1 g tablet Take 1 g by mouth 3 (three) times daily. 02/15/19   [provider]  vitamin B-12 (CYANOCOBALAMIN) 500 MCG tablet Take 500 mcg by mouth daily.    [provider]  zinc gluconate 50 MG tablet Take 50 mg by mouth daily.    [provider]    Physical Exam: Vitals:   07/20/22 1740  BP: 134/60  Pulse: 79  Resp: 20  Temp: 98.4 F (36.9 C)  TempSrc: Oral  SpO2: 100%   General:  Appears calm and comfortable and is in NAD Eyes:  PERRL, EOMI, normal lids, iris ENT:  grossly normal hearing, lips & tongue, mmm; appropriate dentition-dentures  Neck:  no LAD, masses or thyromegaly; no carotid bruits Cardiovascular:  RRR, no m/r/g. RLE edema  Respiratory:   CTA bilaterally with no wheezes/rales/rhonchi.  Normal respiratory effort. Abdomen:  soft, NT, ND, NABS Back:   normal alignment, no CVAT Skin:  RLE: sewn up wound with surrounding erythema, warmth and edema. No drainage. Darkened skin on lateral aspect of approximated skin.    Musculoskeletal:  grossly normal tone BUE/BLE, good ROM, no bony abnormality Lower extremity:  RLE edema.  Limited foot exam with no ulcerations.  2+ distal pulses. Psychiatric:  grossly normal mood and affect, speech fluent and appropriate, AOx3 Neurologic:  CN 2-12 grossly intact, moves all extremities in coordinated fashion, sensation intact   Radiological Exams on Admission: Independently reviewed - see discussion in A/P where applicable  No results found.  EKG: baseline pending    Labs on Admission: I have personally reviewed the available labs and imaging studies at the time of the admission.  Pertinent labs:   Pending   Assessment and Plan: Principal Problem:   Cellulitis Active Problems:   Chronic heart failure with  preserved ejection fraction (HCC)   COPD (chronic obstructive pulmonary disease) (HCC)   Essential hypertension   Prediabetes   Hyperlipidemia   GERD (gastroesophageal reflux disease)   History of DVT (deep vein thrombosis)    Assessment and Plan: * Cellulitis 76 year old female  with history of cut on her right leg s/p suturing with cellulitis/failed outpatient antibiotics with worsening pain, swelling and erythema -obs to med-surg -no criteria for sepsis, but labs pending -on doxycycline x 2 days with possible side effect of N/V vs. Infection -check cbc, cmp, blood cx and lactic acid -start rocephin for moderate cellulitis  -check doppler of RLE with swelling  -orthopedics following and has discussed case with plastics in case surgery needed   Chronic heart failure with preserved ejection fraction ([place]) Euvolemic Echo: 10/2019: normal EF 60-65% with grade 1 DD  Strict I/O  COPD (chronic obstructive pulmonary disease) (HCC) No evidence of flair Continue breo prn and albuterol prn   Essential hypertension Well controlled Continue lisinopril '20mg'$  daily   Prediabetes A1C from 8/22: 6.0 Repeat pending Carb modified diet   Hyperlipidemia Was on medication, but she stopped this and is trying to lose weight   GERD (gastroesophageal reflux disease) Continue protonix daily   History of DVT (deep vein thrombosis) Provoked after knee replacement. Completed course of pradaxa, no longer on anticoagulation      Advance Care Planning:   Code Status: Full Code   Consults: ortho: Dr. Doran Durand   DVT Prophylaxis: SCDs  Family Communication: none   Severity of Illness: The appropriate patient status for this patient is OBSERVATION. Observation status is judged to be reasonable and necessary in order to provide the required intensity of service to ensure the patient's safety. The patient's presenting symptoms, physical exam findings, and initial radiographic and laboratory data in  the context of their medical condition is felt to place them at decreased risk for further clinical deterioration. Furthermore, it is anticipated that the patient will be medically stable for discharge from the hospital within 2 midnights of admission.   Author: Orma Flaming, MD 07/20/2022 7:34 PM  For on call review [email or web address].

## 2022-07-20 NOTE — Assessment & Plan Note (Addendum)
Provoked secondary to knee replacement. Treated with Pradaxa and completed course.

## 2022-07-20 NOTE — Assessment & Plan Note (Signed)
Well controlled Continue lisinopril '20mg'$  daily

## 2022-07-20 NOTE — Assessment & Plan Note (Addendum)
Mild disease. Blood cultures obtained on admission and are no growth to date. Empiric Ceftriaxone IV initiated on admission. Orthopedic surgery has consulted plastic surgery for recommendations on management. Associated right LE edema likely related to infection; venous duplex is negative. Cellulitis is improved except for around necrotic skin. -Continue Ceftriaxone IV -Follow-up plastic surgery recommendations: plan for surgical debridement; it appears patient is scheduled for debridement on 8/9

## 2022-07-20 NOTE — Assessment & Plan Note (Signed)
Hemoglobin A1C of 5.6%. Not on medication management. -Carb modified diet.

## 2022-07-20 NOTE — Assessment & Plan Note (Addendum)
Stable. Patient is on as needed Lasix only as an outpatient.

## 2022-07-20 NOTE — Assessment & Plan Note (Signed)
Continue protonix daily. 

## 2022-07-20 NOTE — Assessment & Plan Note (Addendum)
Stable. -Continue albuterol -Continue Breo Ellipta

## 2022-07-20 NOTE — Assessment & Plan Note (Signed)
Was on medication, but she stopped this and is trying to lose weight

## 2022-07-21 ENCOUNTER — Observation Stay (HOSPITAL_COMMUNITY): Payer: Medicare Other

## 2022-07-21 DIAGNOSIS — Z87891 Personal history of nicotine dependence: Secondary | ICD-10-CM | POA: Diagnosis not present

## 2022-07-21 DIAGNOSIS — Z8249 Family history of ischemic heart disease and other diseases of the circulatory system: Secondary | ICD-10-CM | POA: Diagnosis not present

## 2022-07-21 DIAGNOSIS — Z86718 Personal history of other venous thrombosis and embolism: Secondary | ICD-10-CM

## 2022-07-21 DIAGNOSIS — S81801A Unspecified open wound, right lower leg, initial encounter: Secondary | ICD-10-CM | POA: Diagnosis not present

## 2022-07-21 DIAGNOSIS — G43009 Migraine without aura, not intractable, without status migrainosus: Secondary | ICD-10-CM | POA: Diagnosis present

## 2022-07-21 DIAGNOSIS — D649 Anemia, unspecified: Secondary | ICD-10-CM | POA: Diagnosis not present

## 2022-07-21 DIAGNOSIS — I10 Essential (primary) hypertension: Secondary | ICD-10-CM

## 2022-07-21 DIAGNOSIS — Z882 Allergy status to sulfonamides status: Secondary | ICD-10-CM | POA: Diagnosis not present

## 2022-07-21 DIAGNOSIS — Z881 Allergy status to other antibiotic agents status: Secondary | ICD-10-CM | POA: Diagnosis not present

## 2022-07-21 DIAGNOSIS — I5032 Chronic diastolic (congestive) heart failure: Secondary | ICD-10-CM

## 2022-07-21 DIAGNOSIS — E669 Obesity, unspecified: Secondary | ICD-10-CM | POA: Diagnosis present

## 2022-07-21 DIAGNOSIS — Z880 Allergy status to penicillin: Secondary | ICD-10-CM | POA: Diagnosis not present

## 2022-07-21 DIAGNOSIS — E785 Hyperlipidemia, unspecified: Secondary | ICD-10-CM

## 2022-07-21 DIAGNOSIS — I110 Hypertensive heart disease with heart failure: Secondary | ICD-10-CM | POA: Diagnosis present

## 2022-07-21 DIAGNOSIS — K2100 Gastro-esophageal reflux disease with esophagitis, without bleeding: Secondary | ICD-10-CM | POA: Diagnosis present

## 2022-07-21 DIAGNOSIS — R7303 Prediabetes: Secondary | ICD-10-CM

## 2022-07-21 DIAGNOSIS — J449 Chronic obstructive pulmonary disease, unspecified: Secondary | ICD-10-CM | POA: Diagnosis present

## 2022-07-21 DIAGNOSIS — D509 Iron deficiency anemia, unspecified: Secondary | ICD-10-CM | POA: Diagnosis present

## 2022-07-21 DIAGNOSIS — Z886 Allergy status to analgesic agent status: Secondary | ICD-10-CM | POA: Diagnosis not present

## 2022-07-21 DIAGNOSIS — G9009 Other idiopathic peripheral autonomic neuropathy: Secondary | ICD-10-CM | POA: Diagnosis present

## 2022-07-21 DIAGNOSIS — R609 Edema, unspecified: Secondary | ICD-10-CM

## 2022-07-21 DIAGNOSIS — Z888 Allergy status to other drugs, medicaments and biological substances status: Secondary | ICD-10-CM | POA: Diagnosis not present

## 2022-07-21 DIAGNOSIS — W228XXA Striking against or struck by other objects, initial encounter: Secondary | ICD-10-CM

## 2022-07-21 DIAGNOSIS — Z833 Family history of diabetes mellitus: Secondary | ICD-10-CM | POA: Diagnosis not present

## 2022-07-21 DIAGNOSIS — Z96653 Presence of artificial knee joint, bilateral: Secondary | ICD-10-CM | POA: Diagnosis present

## 2022-07-21 DIAGNOSIS — L03115 Cellulitis of right lower limb: Secondary | ICD-10-CM | POA: Diagnosis present

## 2022-07-21 DIAGNOSIS — I96 Gangrene, not elsewhere classified: Secondary | ICD-10-CM | POA: Diagnosis present

## 2022-07-21 DIAGNOSIS — K219 Gastro-esophageal reflux disease without esophagitis: Secondary | ICD-10-CM | POA: Diagnosis present

## 2022-07-21 DIAGNOSIS — Z800 Family history of malignant neoplasm of digestive organs: Secondary | ICD-10-CM | POA: Diagnosis not present

## 2022-07-21 DIAGNOSIS — Z6833 Body mass index (BMI) 33.0-33.9, adult: Secondary | ICD-10-CM | POA: Diagnosis not present

## 2022-07-21 LAB — RETICULOCYTES
Immature Retic Fract: 15 % (ref 2.3–15.9)
RBC.: 3.63 MIL/uL — ABNORMAL LOW (ref 3.87–5.11)
Retic Count, Absolute: 44.3 10*3/uL (ref 19.0–186.0)
Retic Ct Pct: 1.2 % (ref 0.4–3.1)

## 2022-07-21 LAB — IRON AND TIBC
Iron: 49 ug/dL (ref 28–170)
Saturation Ratios: 7 % — ABNORMAL LOW (ref 10.4–31.8)
TIBC: 658 ug/dL — ABNORMAL HIGH (ref 250–450)
UIBC: 609 ug/dL

## 2022-07-21 LAB — VITAMIN B12: Vitamin B-12: 206 pg/mL (ref 180–914)

## 2022-07-21 LAB — BASIC METABOLIC PANEL
Anion gap: 8 (ref 5–15)
BUN: 19 mg/dL (ref 8–23)
CO2: 23 mmol/L (ref 22–32)
Calcium: 8.6 mg/dL — ABNORMAL LOW (ref 8.9–10.3)
Chloride: 105 mmol/L (ref 98–111)
Creatinine, Ser: 0.78 mg/dL (ref 0.44–1.00)
GFR, Estimated: >60 mL/min (ref >60)
Glucose, Bld: 112 mg/dL — ABNORMAL HIGH (ref 70–99)
Potassium: 4.2 mmol/L (ref 3.5–5.1)
Sodium: 136 mmol/L (ref 135–145)

## 2022-07-21 LAB — CBC
HCT: 31.4 % — ABNORMAL LOW (ref 36.0–46.0)
Hemoglobin: 10.3 g/dL — ABNORMAL LOW (ref 12.0–15.0)
MCH: 28.6 pg (ref 26.0–34.0)
MCHC: 32.8 g/dL (ref 30.0–36.0)
MCV: 87.2 fL (ref 80.0–100.0)
Platelets: 234 10*3/uL (ref 150–400)
RBC: 3.6 MIL/uL — ABNORMAL LOW (ref 3.87–5.11)
RDW: 13.7 % (ref 11.5–15.5)
WBC: 7.1 10*3/uL (ref 4.0–10.5)
nRBC: 0 % (ref 0.0–0.2)

## 2022-07-21 LAB — FOLATE: Folate: 12.6 ng/mL (ref >5.9)

## 2022-07-21 LAB — FERRITIN: Ferritin: 11 ng/mL (ref 11–307)

## 2022-07-21 MED ORDER — FLUTICASONE FUROATE-VILANTEROL 200-25 MCG/ACT IN AEPB
1.0000 | INHALATION_SPRAY | Freq: Every day | RESPIRATORY_TRACT | Status: DC
  Administered 2022-07-21 – 2022-07-27 (×7): 1 via RESPIRATORY_TRACT
  Filled 2022-07-21 (×2): qty 28

## 2022-07-21 MED ORDER — BACITRACIN ZINC 500 UNIT/GM EX OINT
TOPICAL_OINTMENT | Freq: Every day | CUTANEOUS | Status: DC
  Filled 2022-07-21: qty 28.4

## 2022-07-21 NOTE — Assessment & Plan Note (Deleted)
Hemoglobin is stable.   

## 2022-07-21 NOTE — Assessment & Plan Note (Addendum)
Unknown etiology. Normocytic. Hemoglobin stable. No evidence of moderate CKD. Anemia panel significant for likely iron deficiency anemia. Patient with a history of rectal bleeding. Colonoscopy from 10 years prior was unremarkable but with poor prep. Hemoglobin is stable.

## 2022-07-21 NOTE — Progress Notes (Addendum)
[facility]  Reason for Consult: Cellulitis Referring Physician: Dr. [name] is an 76 y.o. female.  HPI: This is a 76 year old female seen in consultation for cellulitis.  Patient notes that on Saturday (5 days ago) she struck her right leg on the corner of a door.  She went to urgent care where she had stitches placed.  She notes she went back the following day approximately 4 days ago and was given a prescription for "Z-Pak".  She notes the redness and swelling continued to worsen.  She was seen in the office by Dr. Doran Durand on Monday and started on doxycycline.  She was seen in the office yesterday and had signs of worsening cellulitis and some discoloration of the skin flap at the wound site.  She was directed to the hospital for direct admission.  She had noted 2 episodes of vomiting yesterday.  Today overall she notes she is doing well, she notes no fever, she describes pain throughout the right leg with associated swelling when compared to the left.  She does note history of blood clots previously.  A ultrasound has been ordered.  She notes she is prediabetic but reports generally well-controlled blood sugars. She denies any history of MRSA/MSSA infections.   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   Qualifier: Diagnosis of  By: Radene Ou MD, [place] DISEASE, 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   Annotation: 1955 Qualifier: History of  By: Radene Ou MD, [place]     Constipation 05/29/2013   COPD (chronic obstructive pulmonary disease) (HCC)    Exertional shortness of breath    Foreign body sensation in throat 12/16/2016   Gastroesophageal reflux disease with esophagitis 12/16/2016   GERD (gastroesophageal reflux disease)    Headache 03/20/2018   migraine Overview:   Overview:  migraine migraine   Headache(784.0)    migraine    Hemorrhoids    History of stomach ulcers 1990's   Hyperlipemia    HYPERLIPIDEMIA 08/25/2007   Qualifier: Diagnosis of  By: Radene Ou MD, [place]     Hypertension    Hypertension    HYPERTENSION 08/09/2007   Qualifier: Diagnosis of  By: Radene Ou MD, [place]     Idiopathic peripheral autonomic neuropathy 03/27/2019   Left lower quadrant pain 09/05/2018   Left shoulder pain 02/05/2013   Lipoma of abdominal wall 01/23/2012   Migraines    Nasal congestion    Nicotine dependence 11/23/2018   Pharyngeal dysphagia 12/16/2016   Prediabetes 01/10/2013   Rectal bleeding    Scapular dyskinesis 03/26/2018   SHINGLES, RECURRENT 09/30/2008   Qualifier: Diagnosis of  By: Radene Ou MD, [place]     Spondylolisthesis, lumbar region 04/10/2019   Added automatically from request for surgery [id number]   Sternal pain 03/26/2018   Synovial cyst of lumbar facet joint 04/08/2019   Urinary frequency    Weight loss    Wheezing     Past Surgical History:  Procedure Laterality Date   APPENDECTOMY  1970's   BACK SURGERY  04/2019   COLONOSCOPY     COLONOSCOPY  12/22/2011   Procedure: COLONOSCOPY;  Surgeon: Lear Ng, MD;  Location: [facility];  Service: Endoscopy;  Laterality: N/A;   heart surgery at 60yr 1955   "  blood was going thru my lungs but not thru my heart like it should" (05/09/2013)   KNEE ARTHROSCOPY Left ~ 2004; ~ 2006   LEFT OOPHORECTOMY Left ~ [place]  02/15/2012   Procedure: EXCISION LIPOMA;  Surgeon: Joyice Faster. Cornett, MD;  Location: [place] [facility];  Service: General;  Laterality: Left;  excision lipoma Left Lower abdomen   TOTAL KNEE ARTHROPLASTY Left 05/08/2013   TOTAL KNEE ARTHROPLASTY Left 05/08/2013   Procedure: TOTAL KNEE ARTHROPLASTY;  Surgeon: Ninetta Lights, MD;  Location: [place];  Service: Orthopedics;  Laterality: Left;   TOTAL KNEE ARTHROPLASTY Right 09/13/2021   Procedure: TOTAL KNEE ARTHROPLASTY;   Surgeon: Gaynelle Arabian, MD;  Location: [facility];  Service: Orthopedics;  Laterality: Right;   TUBAL LIGATION Left ~ [place] Left ~ 1984   "under breast" (05/09/2013)    Family History  Problem Relation Age of Onset   Colon cancer Father    Cancer Father        colon   Heart disease Father    Heart disease Paternal Uncle    Diabetes Sister    Malignant hyperthermia Neg Hx     Social History:  reports that she quit smoking about 8 years ago. Her smoking use included cigarettes. She has a 70.00 pack-year smoking history. She has never used smokeless tobacco. She reports that she does not drink alcohol and does not use drugs.  Allergies:  Allergies  Allergen Reactions   Sulfa Antibiotics Swelling and Rash   Xarelto [Rivaroxaban] Hives and Itching   Amoxicillin Other (See Comments)    REACTION: headache   Azithromycin Other (See Comments)    REACTION: reddness around mouth   Clindamycin/Lincomycin Hives   Erythromycin Hives   Penicillins Other (See Comments)    REACTION: headache Has patient had a PCN reaction causing immediate rash, facial/tongue/throat swelling, SOB or lightheadedness with hypotension: No Has patient had a PCN reaction causing severe rash involving mucus membranes or skin necrosis: No Has patient had a PCN reaction that required hospitalization No Has patient had a PCN reaction occurring within the last 10 years: No If all of the above answers are NO, then may proceed with Cephalosporin use.  Tolerated Cephalosporin Date: 09/14/21.   Propoxyphene N-Acetaminophen Other (See Comments)    REACTION: mouth sores   Clarithromycin Rash    REACTION: skin rash with minimal vesicular formation on arms and hands    Results for orders placed or performed during the hospital encounter of 07/20/22 (from the past 48 hour(s))  Hemoglobin A1c     Status: None   Collection Time: 07/20/22  6:57 PM  Result Value Ref Range   Hgb A1c MFr Bld 5.6 4.8 - 5.6 %     Comment: (NOTE) Pre diabetes:          5.7%-6.4%  Diabetes:              >6.4%  Glycemic control for   <7.0% adults with diabetes    Mean Plasma Glucose 114.02 mg/dL    Comment: Performed at [facility], [place] [address], [place] [address]  CBC     Status: Abnormal   Collection Time: 07/20/22  6:57 PM  Result Value Ref Range   WBC 8.4 4.0 - 10.5 K/uL   RBC 4.08 3.87 - 5.11 MIL/uL   Hemoglobin 11.5 (L) 12.0 - 15.0 g/dL   HCT 35.8 (L) 36.0 - 46.0 %   MCV  87.7 80.0 - 100.0 fL   MCH 28.2 26.0 - 34.0 pg   MCHC 32.1 30.0 - 36.0 g/dL   RDW 13.6 11.5 - 15.5 %   Platelets 276 150 - 400 K/uL   nRBC 0.0 0.0 - 0.2 %    Comment: Performed at [facility] [address] [place], [place] [address]  Comprehensive metabolic panel     Status: Abnormal   Collection Time: 07/20/22  6:57 PM  Result Value Ref Range   Sodium 135 135 - 145 mmol/L   Potassium 3.9 3.5 - 5.1 mmol/L   Chloride 102 98 - 111 mmol/L   CO2 25 22 - 32 mmol/L   Glucose, Bld 107 (H) 70 - 99 mg/dL    Comment: Glucose reference range applies only to samples taken after fasting for at least 8 hours.   BUN 22 8 - 23 mg/dL   Creatinine, Ser 0.74 0.44 - 1.00 mg/dL   Calcium 8.9 8.9 - 10.3 mg/dL   Total Protein 6.1 (L) 6.5 - 8.1 g/dL   Albumin 3.5 3.5 - 5.0 g/dL   AST 17 15 - 41 U/L   ALT 16 0 - 44 U/L   Alkaline Phosphatase 58 38 - 126 U/L   Total Bilirubin 0.5 0.3 - 1.2 mg/dL   GFR, Estimated >60 >60 mL/min    Comment: (NOTE) Calculated using the CKD-EPI Creatinine Equation (2021)    Anion gap 8 5 - 15    Comment: Performed at [facility] [address], [place], [place] [address]  Lactic acid, plasma     Status: None   Collection Time: 07/20/22  7:06 PM  Result Value Ref Range   Lactic Acid, Venous 1.4 0.5 - 1.9 mmol/L    Comment: Performed at [facility] [address] [place], [place] [address]  Lactic acid, plasma     Status: None   Collection Time: 07/20/22  7:06 PM  Result  Value Ref Range   Lactic Acid, Venous 1.4 0.5 - 1.9 mmol/L    Comment: Performed at [facility] [address], [place], [place] [address]  Basic metabolic panel     Status: Abnormal   Collection Time: 07/21/22  1:57 AM  Result Value Ref Range   Sodium 136 135 - 145 mmol/L   Potassium 4.2 3.5 - 5.1 mmol/L   Chloride 105 98 - 111 mmol/L   CO2 23 22 - 32 mmol/L   Glucose, Bld 112 (H) 70 - 99 mg/dL    Comment: Glucose reference range applies only to samples taken after fasting for at least 8 hours.   BUN 19 8 - 23 mg/dL   Creatinine, Ser 0.78 0.44 - 1.00 mg/dL   Calcium 8.6 (L) 8.9 - 10.3 mg/dL   GFR, Estimated >60 >60 mL/min    Comment: (NOTE) Calculated using the CKD-EPI Creatinine Equation (2021)    Anion gap 8 5 - 15    Comment: Performed at [facility] [address], [place], [place] [address]  CBC     Status: Abnormal   Collection Time: 07/21/22  1:57 AM  Result Value Ref Range   WBC 7.1 4.0 - 10.5 K/uL   RBC 3.60 (L) 3.87 - 5.11 MIL/uL   Hemoglobin 10.3 (L) 12.0 - 15.0 g/dL   HCT 31.4 (L) 36.0 - 46.0 %   MCV 87.2 80.0 - 100.0 fL   MCH 28.6 26.0 - 34.0 pg   MCHC 32.8  30.0 - 36.0 g/dL   RDW 13.7 11.5 - 15.5 %   Platelets 234 150 - 400 K/uL   nRBC 0.0 0.0 - 0.2 %    Comment: Performed at [facility], [place] [address], [place], [place] [address]    No results found.  ROS Negative for fever Blood pressure 123/65, pulse 69, temperature (!) 97.5 F (36.4 C), temperature source Oral, resp. rate 18, SpO2 100 %. Physical Exam    Right leg with approx. 3 cm x5 cm wound to the anterior aspect with necrosis of skin flap with surrounding redness and warmth, no purulent discharge. Right leg swollen when compared to left  Assessment/Plan:  This is a 17 YOF seen in consultation for cellulitis. No signs of systemic symptoms at this time. The skin flap that was re approximated does not appear viable at this time. Continue antibiotic therapy. We will continue  to follow.    Stevie Kern Summer Tarkowski, PA-C 07/21/2022, 8:14 AM

## 2022-07-21 NOTE — Progress Notes (Signed)
Right lower extremity venous duplex completed. Refer to "CV Proc" under chart review to view preliminary results.  07/21/2022 2:32 PM Kelby Aline., MHA, RVT, RDCS, RDMS

## 2022-07-21 NOTE — Consult Note (Addendum)
Reason for Consult:  right leg wound Referring Physician: Dr. Trey Paula is an 76 y.o. female.  HPI: 71 y/ female with PMH of diabetes c/o R leg pain since an injury last weekend.  She was getting gas when the car door swung into her anterolateral right leg.  The edge of the door cut her leg.  She went home and washed the wound out before going to [facility].  She reports that no additional irrigation was done, but the wound was closed with sutures.  She was given azithromycin as well.  She c/o swelling, increased pain and increased redness at the leg over the following few days.  On Monday she was switched to doxycycline.  On Wednesday there was no significant improvement in the appearance of her leg.  She had two episodes of vomiting.  She denies f/c/n.  She is now on ceftriaxone.  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   Qualifier: Diagnosis of  By: Radene Ou MD, [place] DISEASE, 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   Annotation: 1957 Qualifier: History of  By: Radene Ou MD, [place]     Constipation 05/29/2013   COPD (chronic obstructive pulmonary disease) (HCC)    Exertional shortness of breath    Foreign body sensation in throat 12/16/2016   Gastroesophageal reflux disease with esophagitis 12/16/2016   GERD (gastroesophageal reflux disease)    Headache 03/20/2018   migraine Overview:  Overview:  migraine migraine   Headache(784.0)    migraine    Hemorrhoids    History of stomach ulcers 1990's   Hyperlipemia    HYPERLIPIDEMIA 08/25/2007   Qualifier: Diagnosis of  By: Radene Ou MD, [place]     Hypertension    Hypertension    HYPERTENSION 08/09/2007   Qualifier: Diagnosis of  By: Radene Ou MD, [place]     Idiopathic peripheral autonomic neuropathy 03/27/2019   Left lower quadrant  pain 09/05/2018   Left shoulder pain 02/05/2013   Lipoma of abdominal wall 01/23/2012   Migraines    Nasal congestion    Nicotine dependence 11/23/2018   Pharyngeal dysphagia 12/16/2016   Prediabetes 01/10/2013   Rectal bleeding    Scapular dyskinesis 03/26/2018   SHINGLES, RECURRENT 09/30/2008   Qualifier: Diagnosis of  By: Radene Ou MD, [place]     Spondylolisthesis, lumbar region 04/10/2019   Added automatically from request for surgery [id number]   Sternal pain 03/26/2018   Synovial cyst of lumbar facet joint 04/08/2019   Urinary frequency    Weight loss    Wheezing     Past Surgical History:  Procedure Laterality Date   APPENDECTOMY  1970's   BACK SURGERY  04/2019   COLONOSCOPY     COLONOSCOPY  12/22/2011   Procedure: COLONOSCOPY;  Surgeon: Lear Ng, MD;  Location: [facility];  Service: Endoscopy;  Laterality: N/A;   heart surgery at 78yr 1955   "blood was going thru my lungs but not thru my heart like it should" (05/09/2013)   KNEE ARTHROSCOPY Left ~ 2004; ~ 2006   LEFT OOPHORECTOMY Left ~ [place] 02/15/2012   Procedure: EXCISION LIPOMA;  Surgeon: TJoyice Faster Cornett, MD;  Location: [facility]  Service: General;  Laterality: Left;  excision lipoma Left Lower abdomen   TOTAL KNEE ARTHROPLASTY Left 05/08/2013   TOTAL KNEE ARTHROPLASTY Left 05/08/2013   Procedure: TOTAL KNEE ARTHROPLASTY;  Surgeon: Ninetta Lights, MD;  Location: [facility];  Service: Orthopedics;  Laterality: Left;   TOTAL KNEE ARTHROPLASTY Right 09/13/2021   Procedure: TOTAL KNEE ARTHROPLASTY;  Surgeon: Gaynelle Arabian, MD;  Location: [facility];  Service: Orthopedics;  Laterality: Right;   TUBAL LIGATION Left ~ [place] Left ~ 1984   "under breast" (05/09/2013)    Family History  Problem Relation Age of Onset   Colon cancer Father    Cancer Father        colon   Heart disease Father    Heart disease Paternal Uncle    Diabetes Sister    Malignant hyperthermia Neg Hx      Social History:  reports that she quit smoking about 8 years ago. Her smoking use included cigarettes. She has a 70.00 pack-year smoking history. She has never used smokeless tobacco. She reports that she does not drink alcohol and does not use drugs.  Allergies:  Allergies  Allergen Reactions   Sulfa Antibiotics Swelling and Rash   Xarelto [Rivaroxaban] Hives and Itching   Amoxicillin Other (See Comments)    REACTION: headache   Azithromycin Other (See Comments)    REACTION: reddness around mouth   Clindamycin/Lincomycin Hives   Erythromycin Hives   Penicillins Other (See Comments)    REACTION: headache Has patient had a PCN reaction causing immediate rash, facial/tongue/throat swelling, SOB or lightheadedness with hypotension: No Has patient had a PCN reaction causing severe rash involving mucus membranes or skin necrosis: No Has patient had a PCN reaction that required hospitalization No Has patient had a PCN reaction occurring within the last 10 years: No If all of the above answers are NO, then may proceed with Cephalosporin use.  Tolerated Cephalosporin Date: 09/14/21.   Propoxyphene N-Acetaminophen Other (See Comments)    REACTION: mouth sores   Clarithromycin Rash    REACTION: skin rash with minimal vesicular formation on arms and hands    Medications: I have reviewed the patient's current medications.  Results for orders placed or performed during the hospital encounter of 07/20/22 (from the past 48 hour(s))  Folate     Status: None   Collection Time: 07/20/22 12:20 PM  Result Value Ref Range   Folate 12.6 >5.9 ng/mL    Comment: Performed at [facility], [place] [address] [place], [place] [address]  Vitamin B12     Status: None   Collection Time: 07/20/22  1:57 PM  Result Value Ref Range   Vitamin B-12 206 180 - 914 pg/mL    Comment: (NOTE) This assay is not validated for testing neonatal or myeloproliferative syndrome specimens for Vitamin B12  levels. Performed at [facility], [place] [address] [place], [place] [address]   Iron and TIBC     Status: Abnormal   Collection Time: 07/20/22  1:57 PM  Result Value Ref Range   Iron 49 28 - 170 ug/dL   TIBC 658 (H) 250 - 450 ug/dL   Saturation Ratios 7 (L) 10.4 - 31.8 %   UIBC 609 ug/dL    Comment: Performed at [facility], [place] [address] [place], [place] [address]  Ferritin     Status: None   Collection Time: 07/20/22  1:57 PM  Result Value Ref Range   Ferritin 11 11 - 307 ng/mL  Comment: Performed at [facility], [place] [address] [place], [place] [address]  Hemoglobin A1c     Status: None   Collection Time: 07/20/22  6:57 PM  Result Value Ref Range   Hgb A1c MFr Bld 5.6 4.8 - 5.6 %    Comment: (NOTE) Pre diabetes:          5.7%-6.4%  Diabetes:              >6.4%  Glycemic control for   <7.0% adults with diabetes    Mean Plasma Glucose 114.02 mg/dL    Comment: Performed at [facility] [address] [place], [place] [address]  CBC     Status: Abnormal   Collection Time: 07/20/22  6:57 PM  Result Value Ref Range   WBC 8.4 4.0 - 10.5 K/uL   RBC 4.08 3.87 - 5.11 MIL/uL   Hemoglobin 11.5 (L) 12.0 - 15.0 g/dL   HCT 35.8 (L) 36.0 - 46.0 %   MCV 87.7 80.0 - 100.0 fL   MCH 28.2 26.0 - 34.0 pg   MCHC 32.1 30.0 - 36.0 g/dL   RDW 13.6 11.5 - 15.5 %   Platelets 276 150 - 400 K/uL   nRBC 0.0 0.0 - 0.2 %    Comment: Performed at [facility], [place] [address] [place], [place] [address]  Comprehensive metabolic panel     Status: Abnormal   Collection Time: 07/20/22  6:57 PM  Result Value Ref Range   Sodium 135 135 - 145 mmol/L   Potassium 3.9 3.5 - 5.1 mmol/L   Chloride 102 98 - 111 mmol/L   CO2 25 22 - 32 mmol/L   Glucose, Bld 107 (H) 70 - 99 mg/dL    Comment: Glucose reference range applies only to samples taken after fasting for at least 8 hours.   BUN 22 8 - 23 mg/dL   Creatinine, Ser 0.74 0.44 - 1.00 mg/dL   Calcium 8.9 8.9 - 10.3  mg/dL   Total Protein 6.1 (L) 6.5 - 8.1 g/dL   Albumin 3.5 3.5 - 5.0 g/dL   AST 17 15 - 41 U/L   ALT 16 0 - 44 U/L   Alkaline Phosphatase 58 38 - 126 U/L   Total Bilirubin 0.5 0.3 - 1.2 mg/dL   GFR, Estimated >60 >60 mL/min    Comment: (NOTE) Calculated using the CKD-EPI Creatinine Equation (2021)    Anion gap 8 5 - 15    Comment: Performed at [facility] [address], [place], [place] [address]  Culture, blood (Routine X 2) w Reflex to ID Panel     Status: None (Preliminary result)   Collection Time: 07/20/22  7:06 PM   Specimen: BLOOD  Result Value Ref Range   Specimen Description BLOOD RIGHT ANTECUBITAL    Special Requests      BOTTLES DRAWN AEROBIC AND ANAEROBIC Blood Culture adequate volume   Culture      NO GROWTH < 24 HOURS Performed at [facility], [place] [address] [place], [place] [address]    Report Status PENDING   Culture, blood (Routine X 2) w Reflex to ID Panel     Status: None (Preliminary result)   Collection Time: 07/20/22  7:06 PM   Specimen: BLOOD LEFT HAND  Result Value Ref Range   Specimen Description BLOOD LEFT HAND    Special Requests      BOTTLES DRAWN AEROBIC AND ANAEROBIC Blood Culture results  may not be optimal due to an inadequate volume of blood received in culture bottles   Culture      NO GROWTH < 24 HOURS Performed at [facility] [address] [place], [place] [address]    Report Status PENDING   Lactic acid, plasma     Status: None   Collection Time: 07/20/22  7:06 PM  Result Value Ref Range   Lactic Acid, Venous 1.4 0.5 - 1.9 mmol/L    Comment: Performed at [facility], [place] [address], [place], [place] [address]  Lactic acid, plasma     Status: None   Collection Time: 07/20/22  7:06 PM  Result Value Ref Range   Lactic Acid, Venous 1.4 0.5 - 1.9 mmol/L    Comment: Performed at [facility] [address], [place], [place] [address]  Basic metabolic panel     Status: Abnormal   Collection Time:  07/21/22  1:57 AM  Result Value Ref Range   Sodium 136 135 - 145 mmol/L   Potassium 4.2 3.5 - 5.1 mmol/L   Chloride 105 98 - 111 mmol/L   CO2 23 22 - 32 mmol/L   Glucose, Bld 112 (H) 70 - 99 mg/dL    Comment: Glucose reference range applies only to samples taken after fasting for at least 8 hours.   BUN 19 8 - 23 mg/dL   Creatinine, Ser 0.78 0.44 - 1.00 mg/dL   Calcium 8.6 (L) 8.9 - 10.3 mg/dL   GFR, Estimated >60 >60 mL/min    Comment: (NOTE) Calculated using the CKD-EPI Creatinine Equation (2021)    Anion gap 8 5 - 15    Comment: Performed at [facility] [address], [place], [place] [address]  CBC     Status: Abnormal   Collection Time: 07/21/22  1:57 AM  Result Value Ref Range   WBC 7.1 4.0 - 10.5 K/uL   RBC 3.60 (L) 3.87 - 5.11 MIL/uL   Hemoglobin 10.3 (L) 12.0 - 15.0 g/dL   HCT 31.4 (L) 36.0 - 46.0 %   MCV 87.2 80.0 - 100.0 fL   MCH 28.6 26.0 - 34.0 pg   MCHC 32.8 30.0 - 36.0 g/dL   RDW 13.7 11.5 - 15.5 %   Platelets 234 150 - 400 K/uL   nRBC 0.0 0.0 - 0.2 %    Comment: Performed at [facility], [place] [address] [place], [place] [address]  Reticulocytes     Status: Abnormal   Collection Time: 07/21/22  1:57 AM  Result Value Ref Range   Retic Ct Pct 1.2 0.4 - 3.1 %   RBC. 3.63 (L) 3.87 - 5.11 MIL/uL   Retic Count, Absolute 44.3 19.0 - 186.0 K/uL   Immature Retic Fract 15.0 2.3 - 15.9 %    Comment: Performed at [facility] [address] [place], [place] [address]    VAS [place] LOWER EXTREMITY VENOUS (DVT)  Result Date: 07/21/2022  Lower Venous DVT Study Patient Name:  JACKILYN UMPHLETT  Date of Exam:   07/21/2022 Medical Rec #: [id number]         Accession #:    [id number] Date of Birth: [birth year]         Patient Gender: F Patient Age:   56 years Exam Location:  [facility] Procedure:      VAS [place] LOWER EXTREMITY VENOUS (DVT) Referring Phys: Orma Flaming --------------------------------------------------------------------------------   Indications: Edema, Erythema,  Pain, and s/p right calf injury.  Comparison Study: 02/01/2022- negative right lower extremity venous duplex Performing Technologist: Maudry Mayhew MHA, RDMS, RVT, RDCS  Examination Guidelines: A complete evaluation includes B-mode imaging, spectral Doppler, color Doppler, and power Doppler as needed of all accessible portions of each vessel. Bilateral testing is considered an integral part of a complete examination. Limited examinations for reoccurring indications may be performed as noted. The reflux portion of the exam is performed with the patient in reverse Trendelenburg.  +---------+---------------+---------+-----------+----------+--------------+ RIGHT    CompressibilityPhasicitySpontaneityPropertiesThrombus Aging +---------+---------------+---------+-----------+----------+--------------+ CFV      Full           Yes      Yes                                 +---------+---------------+---------+-----------+----------+--------------+ SFJ      Full                                                        +---------+---------------+---------+-----------+----------+--------------+ FV Prox  Full                                                        +---------+---------------+---------+-----------+----------+--------------+ FV Mid   Full                                                        +---------+---------------+---------+-----------+----------+--------------+ FV DistalFull                                                        +---------+---------------+---------+-----------+----------+--------------+ PFV      Full                                                        +---------+---------------+---------+-----------+----------+--------------+ POP      Full           Yes      Yes                                 +---------+---------------+---------+-----------+----------+--------------+ PTV      Full                                                         +---------+---------------+---------+-----------+----------+--------------+ PERO     Full                                                        +---------+---------------+---------+-----------+----------+--------------+   +----+---------------+---------+-----------+----------+--------------+  LEFTCompressibilityPhasicitySpontaneityPropertiesThrombus Aging +----+---------------+---------+-----------+----------+--------------+ CFV Full           Yes      Yes                                 +----+---------------+---------+-----------+----------+--------------+     Summary: RIGHT: - There is no evidence of deep vein thrombosis in the lower extremity.  - No cystic structure found in the popliteal fossa.  LEFT: - No evidence of common femoral vein obstruction.  *See table(s) above for measurements and observations.    Preliminary     ROS:  + vomiting.  No f/c/n/CP/SOB.  10 system review is o/w negative. PE:  Blood pressure (!) 102/54, pulse 68, temperature 97.6 F (36.4 C), resp. rate 18, SpO2 98 %. 80 wd female in nad.  A and O x 4.  Normal mood and affect.  EOMI.  Resp unlabored.  R LE with angula laceration at the anterolateral leg.  Vertical limb is approx 3 cm and transverse oblique laceration about 4 cm.  Corner of the flap has signficant necrosis of the skin.  Erythema and swelling around the laceration.  Serous drainage.  No fluctuance.  TTP around the laceration.  No lymphadenopathy or lymphangiitis.  5/5 strength in PF and DF of the ankle and toes.  Intact sens to LT at the dorsal forefoot.  2+ DP and PT pulses.  Assessment/Plan: R leg cellulitis after laceration closed at urgent care.  There has been no improvement in the appearance of the leg despite several days of azithromycin and doxycycline, though it doesn't look worse.  I concur with IV abx.  I don't believe there is an abscess.  The appearance of the corner of the wound is concerning for  necrosis of the flap corner.  I recommend plastic surgery consultation and have contacted Dr. Marla Roe to get her opinion.  The patient is safe to bear weight as tolerated on the R LE.  Wylene Simmer 07/21/2022, 3:06 PM

## 2022-07-21 NOTE — Hospital Course (Addendum)
Summer Jackson is Summer Jackson 76 y.o. female with Spike Desilets history of COPD, GERD, hyperlipidemia, hypertension, HFpEF. Patient presented from her orthopedic surgeon's office secondary to cellulitis from previously managed wound. Plastic surgery consulted. Empiric IV antibiotics initiated.  Plan was for surgery 8/9, but she ate this morning, so plan is for outpatient plastics intervention.  Will discharge on oral abx with plan for outpatient wound care and plastics follow up.  See below for additional details

## 2022-07-21 NOTE — Progress Notes (Addendum)
PROGRESS NOTE    Summer Jackson  ZJQ:[id number] DOB: Jul 22, 1946 DOA: 07/20/2022 PCP: Imagene Riches, NP   Brief Narrative: Summer Jackson is a 76 y.o. female with a history of COPD, GERD, hyperlipidemia, hypertension, HFpEF. Patient presented from her orthopedic surgeon's office secondary to cellulitis from previously managed wound. Plastic surgery consulted. Empiric IV antibiotics initiated.   Assessment and Plan: * Cellulitis Mild disease. Blood cultures obtained on admission. Empiric Ceftriaxone IV initiated on admission. Orthopedic surgery has consulted plastic surgery for recommendations on management. Associated right LE edema likely related to infection; venous duplex ordered on admission and is pending. -Continue Ceftriaxone IV -Follow-up blood cultures -Follow-up surgery recommendations  Chronic heart failure with preserved ejection fraction (HCC) Stable. Patient is on as needed Lasix only  COPD (chronic obstructive pulmonary disease) (HCC) Stable. -Continue albuterol -Switch to Breo Ellipta scheduled  Essential hypertension -Continue lisinopril  Prediabetes Hemoglobin A1C of 5.6%. Not on medication management. -Carb modified diet.  Hyperlipidemia Noted. Not currently on medication management.  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 Treated with Pradaxa and completed course.  Chronic anemia Unknown etiology. Hemoglobin stable. No evidence of moderate CKD -Anemia panel    DVT prophylaxis: SCDs Code Status:   Code Status: Full Code Family Communication: None at bedside Disposition Plan: Discharge home pending orthopedic/plastic surgery recommendations and transition to oral antibiotics   Consultants:  Orthopedic surgery Plastic surgery  Procedures:  None  Antimicrobials: Ceftriaxone    Subjective: Patient is worried about her  swelling and skin redness of right LE.  Objective: BP (!) 105/46 (BP Location: Right Arm)   Pulse 68   Temp 97.7 F (36.5 C)   Resp 18   SpO2 100%   Examination:  General exam: Appears calm and comfortable Respiratory system: Clear to auscultation. Respiratory effort normal. Cardiovascular system: S1 & S2 heard, RRR. Gastrointestinal system: Abdomen is nondistended, soft and nontender. No organomegaly or masses felt. Normal bowel sounds heard. Central nervous system: Alert and oriented. No focal neurological deficits. Musculoskeletal: RLE edema. No calf tenderness Skin: No cyanosis. No rashes Psychiatry: Judgement and insight appear normal. Mood & affect appropriate.    Data Reviewed: I have personally reviewed following labs and imaging studies  CBC Lab Results  Component Value Date   WBC 7.1 07/21/2022   RBC 3.60 (L) 07/21/2022   HGB 10.3 (L) 07/21/2022   HCT 31.4 (L) 07/21/2022   MCV 87.2 07/21/2022   MCH 28.6 07/21/2022   PLT 234 07/21/2022   MCHC 32.8 07/21/2022   RDW 13.7 07/21/2022   LYMPHSABS 1.4 08/17/2021   MONOABS 0.6 08/17/2021   EOSABS 0.1 08/17/2021   BASOSABS 0.0 24/08/7352     Last metabolic panel Lab Results  Component Value Date   NA 136 07/21/2022   K 4.2 07/21/2022   CL 105 07/21/2022   CO2 23 07/21/2022   BUN 19 07/21/2022   CREATININE 0.78 07/21/2022   GLUCOSE 112 (H) 07/21/2022   GFRNONAA >60 07/21/2022   GFRAA 105 02/14/2020   CALCIUM 8.6 (L) 07/21/2022   PROT 6.1 (L) 07/20/2022   ALBUMIN 3.5 07/20/2022   BILITOT 0.5 07/20/2022   ALKPHOS 58 07/20/2022   AST 17 07/20/2022   ALT 16 07/20/2022   ANIONGAP 8 07/21/2022    GFR: CrCl cannot be calculated (Unknown ideal weight.).  No results found for this or any previous visit (from the past 240 hour(s)).  Radiology Studies: No results found.    LOS: 0 days    Cordelia Poche, MD [facility] 07/21/2022, 12:07 PM   If 7PM-7AM, please contact  night-coverage [email or web address]

## 2022-07-21 NOTE — Plan of Care (Signed)

## 2022-07-22 DIAGNOSIS — D649 Anemia, unspecified: Secondary | ICD-10-CM | POA: Diagnosis not present

## 2022-07-22 DIAGNOSIS — J449 Chronic obstructive pulmonary disease, unspecified: Secondary | ICD-10-CM | POA: Diagnosis not present

## 2022-07-22 DIAGNOSIS — S81801A Unspecified open wound, right lower leg, initial encounter: Secondary | ICD-10-CM | POA: Diagnosis not present

## 2022-07-22 DIAGNOSIS — I5032 Chronic diastolic (congestive) heart failure: Secondary | ICD-10-CM | POA: Diagnosis not present

## 2022-07-22 DIAGNOSIS — L03115 Cellulitis of right lower limb: Secondary | ICD-10-CM | POA: Diagnosis not present

## 2022-07-22 DIAGNOSIS — W228XXA Striking against or struck by other objects, initial encounter: Secondary | ICD-10-CM | POA: Diagnosis not present

## 2022-07-22 NOTE — Progress Notes (Signed)
  Transition of Care [facility]) Screening Note   Patient Details  Name: Summer Jackson Date of Birth: 07-12-1946   Transition of Care [facility]) CM/SW Contact:    Bartholomew Crews, RN Phone Number: [phone] 07/22/2022, 11:57 AM    Transition of Care Department [facility]) has reviewed patient and no TOC needs have been identified at this time. We will continue to monitor patient advancement through interdisciplinary progression rounds. If new patient transition needs arise, please place a TOC consult.

## 2022-07-22 NOTE — Evaluation (Signed)
Physical Therapy Evaluation Patient Details Name: Summer Jackson MRN: [id number] DOB: Jun 09, 1946 Today's Date: 07/22/2022  History of Present Illness  76 y.o. female presents to [facility] on 07/20/2022 with cellulitis and laceration of R leg. Her leg injury occured this past Saturday and she had it stitched up at urgent care. Pt reports that inflammation of RLE progressively got worse and saw her doctor about it before she was sent to [facility]. PMH: COPD, GERD, HFpEF, chronic anemia.  Clinical Impression  Pt presents to PT with insignificant deficits in strength, balance, and gait. Pt tolerates therapy well today, ambulating extended household distances with an AD and ambulating in her room without an AD. PT educates for RW use when inflammation or pain in her RLE are increased. No continued skilled PT needs identified, as pt mobilizes well and has plenty of support at home. PT will let mobility specialists take over; PT is signing-off.        Recommendations for follow up therapy are one component of a multi-disciplinary discharge planning process, led by the attending physician.  Recommendations may be updated based on patient status, additional functional criteria and insurance authorization.  Follow Up Recommendations No PT follow up      Assistance Recommended at Discharge PRN  Patient can return home with the following  Other (comment) (Assist PRN)    Equipment Recommendations None recommended by PT  Recommendations for Other Services       Functional Status Assessment Patient has had a recent decline in their functional status and demonstrates the ability to make significant improvements in function in a reasonable and predictable amount of time.     Precautions / Restrictions Precautions Precautions: Fall Restrictions Weight Bearing Restrictions: No      Mobility  Bed Mobility Overal bed mobility: Modified Independent                  Transfers Overall transfer  level: Independent Equipment used: None Transfers: Sit to/from Stand Sit to Stand: Independent                Ambulation/Gait Ambulation/Gait assistance: Supervision Gait Distance (Feet): 200 Feet Assistive device: Rolling walker (2 wheels), None (RW in hall; no AD in room and to bathroom) Gait Pattern/deviations: WFL(Within Functional Limits) Gait velocity: WFL Gait velocity interpretation: >2.62 ft/sec, indicative of community ambulatory   General Gait Details: Pt is able to ambulate without AD but may move quicker than what is safe for her (was going to bathroom - likely in a rush). PT educates for RW use when inflammation or pain in RLE is increased.  Stairs            Wheelchair Mobility    Modified Rankin (Stroke Patients Only)       Balance Overall balance assessment: Mild deficits observed, not formally tested                                           Pertinent Vitals/Pain Pain Assessment Pain Assessment: 0-10 Pain Score: 9  Pain Location: R hip Pain Descriptors / Indicators: Sore Pain Intervention(s): Monitored during session    Home Living Family/patient expects to be discharged to:: Private residence Living Arrangements: Children;Other relatives (Lives with her daughter and her daughter's husband and their children; Daughter not working and is with pt at home constantly) Available Help at Discharge: Family;Available 24 hours/day Type of Home:  House Home Access: Level entry       Home Layout: One level Home Equipment: Conservation officer, nature (2 wheels);Cane - single point      Prior Function Prior Level of Function : Independent/Modified Independent;Driving             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 ADLs Comments: Ind     Hand Dominance        Extremity/Trunk Assessment   Upper Extremity Assessment Upper Extremity Assessment: Overall WFL for tasks assessed    Lower Extremity  Assessment Lower Extremity Assessment: Overall WFL for tasks assessed    Cervical / Trunk Assessment Cervical / Trunk Assessment: Normal  Communication   Communication: No difficulties  Cognition Arousal/Alertness: Awake/alert Behavior During Therapy: WFL for tasks assessed/performed Overall Cognitive Status: Within Functional Limits for tasks assessed                                          General Comments General comments (skin integrity, edema, etc.): VSS on RA    Exercises     Assessment/Plan    PT Assessment Patient does not need any further PT services  PT Problem List         PT Treatment Interventions      PT Goals (Current goals can be found in the Care Plan section)  Acute Rehab PT Goals Patient Stated Goal: Return Home PT Goal Formulation: With patient Time For Goal Achievement: 08/05/22 Potential to Achieve Goals: Good    Frequency       Co-evaluation               AM-PAC PT "6 Clicks" Mobility  Outcome Measure Help needed turning from your back to your side while in a flat bed without using bedrails?: None Help needed moving from lying on your back to sitting on the side of a flat bed without using bedrails?: None Help needed moving to and from a bed to a chair (including a wheelchair)?: None Help needed standing up from a chair using your arms (e.g., wheelchair or bedside chair)?: None Help needed to walk in hospital room?: A Little Help needed climbing 3-5 steps with a railing? : A Little 6 Click Score: 22    End of Session   Activity Tolerance: Patient tolerated treatment well Patient left: in chair;with call bell/phone within reach Nurse Communication: Mobility status PT Visit Diagnosis: Muscle weakness (generalized) (M62.81)    Time: 6389-3734 PT Time Calculation (min) (ACUTE ONLY): 24 min   Charges:   PT Evaluation $PT Eval Low Complexity: 1 Low PT Treatments $Gait Training: 8-22 mins        Hall Busing, SPT Acute Rehabilitation Office #: [phone]   Hall Busing 07/22/2022, 5:03 PM

## 2022-07-22 NOTE — Progress Notes (Signed)
Subjective: The patient is doing well today, she denies any significant changes.  Continues to receive IV antibiotics.  She is also having dressing changes with bacitracin.  She denies any fever.  Objective: Vital signs in last 24 hours: Temp:  [97.9 F (36.6 C)-98 F (36.7 C)] 97.9 F (36.6 C) (08/04 1119) Pulse Rate:  [65-69] 69 (08/04 1119) Resp:  [18-19] 18 (08/04 1119) BP: (106-125)/(58-62) 125/58 (08/04 1119) SpO2:  [96 %-100 %] 100 % (08/04 1119) Last BM Date : 07/20/22   Physical Exam:  General: Pt resting comfortably in no acute distress  Right leg with approximately 3 x 5 cm wound to the anterior aspect with necrosis of the skin flap with surrounding redness and warmth, no purulence.  Continued edema within the right lower extremity.   Assessment/Plan: s/p Procedure(s): RIGHT LEG DEBRIDEMENT WITH MYRIAD PLACEMENT  Ms. Faux is doing well today with no significant changes.  Her infection does not seem to be worsening but does not have any dramatic improvement.  It does appear that that wound and necrosis will need to be removed.  We would recommend continued IV antibiotic therapy to help get this infection under control.  We will plan for operative intervention next week which will include excision of the wound with application of myriad.  Would recommend continued daily dressing changes with bacitracin.  We will continue to follow.    LOS: 1 day    Elmer Ramp, PA-C 07/22/2022

## 2022-07-22 NOTE — Plan of Care (Signed)

## 2022-07-22 NOTE — Progress Notes (Signed)
PROGRESS NOTE    HARSHITA BERNALES  IRC:[id number] DOB: [birth year] DOA: 07/20/2022 PCP: Imagene Riches, NP   Brief Narrative: Summer Jackson is a 76 y.o. female with a history of COPD, GERD, hyperlipidemia, hypertension, HFpEF. Patient presented from her orthopedic surgeon's office secondary to cellulitis from previously managed wound. Plastic surgery consulted. Empiric IV antibiotics initiated.   Assessment and Plan: * Cellulitis Mild disease. Blood cultures obtained on admission. Empiric Ceftriaxone IV initiated on admission. Orthopedic surgery has consulted plastic surgery for recommendations on management. Associated right LE edema likely related to infection; venous duplex is negative. -Continue Ceftriaxone IV -Follow-up blood cultures (8/2) -Follow-up plastic surgery recommendations pending  Chronic heart failure with preserved ejection fraction (HCC) Stable. Patient is on as needed Lasix only  COPD (chronic obstructive pulmonary disease) (HCC) Stable. -Continue albuterol -Continue Breo Ellipta  Essential hypertension -Continue lisinopril  Prediabetes Hemoglobin A1C of 5.6%. Not on medication management. -Carb modified diet.  Hyperlipidemia Noted. Not currently on medication management.  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 Treated with Pradaxa and completed course.  Chronic anemia Unknown etiology. Hemoglobin stable. No evidence of moderate CKD -Anemia panel    DVT prophylaxis: SCDs Code Status:   Code Status: Full Code Family Communication: None at bedside Disposition Plan: Discharge home pending orthopedic/plastic surgery recommendations and transition to oral antibiotics   Consultants:  Orthopedic surgery Plastic surgery  Procedures:  None  Antimicrobials: Ceftriaxone IV   Subjective: Patient states she was told she will stay in  the hospital receiving IV antibiotics until her infection is completely treated. Patient is worried about her swelling and skin redness of right LE. States she told "the little black girl" that her leg is supposed to be elevated.   Objective: BP 106/62 (BP Location: Left Arm)   Pulse 65   Temp 98 F (36.7 C) (Oral)   Resp 19   SpO2 96%   Examination:  General exam: Appears calm and comfortable Respiratory system: Respiratory effort normal. Gastrointestinal system: Abdomen is non-distended Central nervous system: Alert and oriented. No focal neurological deficits. Musculoskeletal: RLE edema. Skin: mild erythema of right lower extremity. Bandage in place over wound. Psychiatry: Judgement and insight appear normal. Mood & affect appropriate.    Data Reviewed: I have personally reviewed following labs and imaging studies  CBC Lab Results  Component Value Date   WBC 7.1 07/21/2022   RBC 3.60 (L) 07/21/2022   RBC 3.63 (L) 07/21/2022   HGB 10.3 (L) 07/21/2022   HCT 31.4 (L) 07/21/2022   MCV 87.2 07/21/2022   MCH 28.6 07/21/2022   PLT 234 07/21/2022   MCHC 32.8 07/21/2022   RDW 13.7 07/21/2022   LYMPHSABS 1.4 08/17/2021   MONOABS 0.6 08/17/2021   EOSABS 0.1 08/17/2021   BASOSABS 0.0 51/01/5851     Last metabolic panel Lab Results  Component Value Date   NA 136 07/21/2022   K 4.2 07/21/2022   CL 105 07/21/2022   CO2 23 07/21/2022   BUN 19 07/21/2022   CREATININE 0.78 07/21/2022   GLUCOSE 112 (H) 07/21/2022   GFRNONAA >60 07/21/2022   GFRAA 105 02/14/2020   CALCIUM 8.6 (L) 07/21/2022   PROT 6.1 (L) 07/20/2022   ALBUMIN 3.5 07/20/2022   BILITOT 0.5 07/20/2022   ALKPHOS 58 07/20/2022   AST 17 07/20/2022   ALT 16 07/20/2022   ANIONGAP 8 07/21/2022    GFR: CrCl cannot  be calculated (Unknown ideal weight.).  Recent Results (from the past 240 hour(s))  Culture, blood (Routine X 2) w Reflex to ID Panel     Status: None (Preliminary result)   Collection Time:  07/20/22  7:06 PM   Specimen: BLOOD  Result Value Ref Range Status   Specimen Description BLOOD RIGHT ANTECUBITAL  Final   Special Requests   Final    BOTTLES DRAWN AEROBIC AND ANAEROBIC Blood Culture adequate volume   Culture   Final    NO GROWTH < 24 HOURS Performed at [facility], [address], [place], [place] [address]    Report Status PENDING  Incomplete  Culture, blood (Routine X 2) w Reflex to ID Panel     Status: None (Preliminary result)   Collection Time: 07/20/22  7:06 PM   Specimen: BLOOD LEFT HAND  Result Value Ref Range Status   Specimen Description BLOOD LEFT HAND  Final   Special Requests   Final    BOTTLES DRAWN AEROBIC AND ANAEROBIC Blood Culture results may not be optimal due to an inadequate volume of blood received in culture bottles   Culture   Final    NO GROWTH < 24 HOURS Performed at [facility], [place] [address] [place], [place] [address]    Report Status PENDING  Incomplete      Radiology Studies: VAS [place] LOWER EXTREMITY VENOUS (DVT)  Result Date: 07/21/2022  Lower Venous DVT Study Patient Name:  GIBSON TELLERIA  Date of Exam:   07/21/2022 Medical Rec #: [id number]         Accession #:    [id number] Date of Birth: 08-11-1946         Patient Gender: F Patient Age:   42 years Exam Location:  [facility] Procedure:      VAS [place] LOWER EXTREMITY VENOUS (DVT) Referring Phys: Orma Flaming --------------------------------------------------------------------------------  Indications: Edema, Erythema, Pain, and s/p right calf injury.  Comparison Study: 02/01/2022- negative right lower extremity venous duplex Performing Technologist: Maudry Mayhew MHA, RDMS, RVT, RDCS  Examination Guidelines: A complete evaluation includes B-mode imaging, spectral Doppler, color Doppler, and power Doppler as needed of all accessible portions of each vessel. Bilateral testing is considered an integral part of a complete examination. Limited examinations for  reoccurring indications may be performed as noted. The reflux portion of the exam is performed with the patient in reverse Trendelenburg.  +---------+---------------+---------+-----------+----------+--------------+ RIGHT    CompressibilityPhasicitySpontaneityPropertiesThrombus Aging +---------+---------------+---------+-----------+----------+--------------+ CFV      Full           Yes      Yes                                 +---------+---------------+---------+-----------+----------+--------------+ SFJ      Full                                                        +---------+---------------+---------+-----------+----------+--------------+ FV Prox  Full                                                        +---------+---------------+---------+-----------+----------+--------------+  FV Mid   Full                                                        +---------+---------------+---------+-----------+----------+--------------+ FV DistalFull                                                        +---------+---------------+---------+-----------+----------+--------------+ PFV      Full                                                        +---------+---------------+---------+-----------+----------+--------------+ POP      Full           Yes      Yes                                 +---------+---------------+---------+-----------+----------+--------------+ PTV      Full                                                        +---------+---------------+---------+-----------+----------+--------------+ PERO     Full                                                        +---------+---------------+---------+-----------+----------+--------------+   +----+---------------+---------+-----------+----------+--------------+ LEFTCompressibilityPhasicitySpontaneityPropertiesThrombus Aging +----+---------------+---------+-----------+----------+--------------+  CFV Full           Yes      Yes                                 +----+---------------+---------+-----------+----------+--------------+     Summary: RIGHT: - There is no evidence of deep vein thrombosis in the lower extremity.  - No cystic structure found in the popliteal fossa.  LEFT: - No evidence of common femoral vein obstruction.  *See table(s) above for measurements and observations. Electronically signed by Jamelle Haring on 07/21/2022 at 4:37:39 PM.    Final       LOS: 1 day    Cordelia Poche, MD [facility] 07/22/2022, 9:31 AM   If 7PM-7AM, please contact night-coverage [email or web address]

## 2022-07-23 DIAGNOSIS — D649 Anemia, unspecified: Secondary | ICD-10-CM | POA: Diagnosis not present

## 2022-07-23 DIAGNOSIS — L03115 Cellulitis of right lower limb: Secondary | ICD-10-CM | POA: Diagnosis not present

## 2022-07-23 DIAGNOSIS — I5032 Chronic diastolic (congestive) heart failure: Secondary | ICD-10-CM | POA: Diagnosis not present

## 2022-07-23 DIAGNOSIS — J449 Chronic obstructive pulmonary disease, unspecified: Secondary | ICD-10-CM | POA: Diagnosis not present

## 2022-07-23 LAB — CBC
HCT: 37.1 % (ref 36.0–46.0)
Hemoglobin: 11.7 g/dL — ABNORMAL LOW (ref 12.0–15.0)
MCH: 28.7 pg (ref 26.0–34.0)
MCHC: 31.5 g/dL (ref 30.0–36.0)
MCV: 90.9 fL (ref 80.0–100.0)
Platelets: 227 10*3/uL (ref 150–400)
RBC: 4.08 MIL/uL (ref 3.87–5.11)
RDW: 13.5 % (ref 11.5–15.5)
WBC: 6.5 10*3/uL (ref 4.0–10.5)
nRBC: 0 % (ref 0.0–0.2)

## 2022-07-23 NOTE — Evaluation (Signed)
Occupational Therapy Evaluation Patient Details Name: Summer Jackson MRN: [id number] DOB: 1946-09-11 Today's Date: 07/23/2022   History of Present Illness 76 y.o. female presents to [facility] on 07/20/2022 with cellulitis and laceration of R leg. Her leg injury occured this past Saturday and she had it stitched up at urgent care. Pt reports that inflammation of RLE progressively got worse and saw her doctor about it before she was sent to [facility]. PMH: COPD, GERD, HFpEF, chronic anemia.   Clinical Impression   Patient evaluated by Occupational Therapy with no further acute OT needs identified. All education has been completed and the patient has no further questions. Pt is able to complete ADLs mod I.   See below for any follow-up Occupational Therapy or equipment needs. OT is signing off. Thank you for this referral.       Recommendations for follow up therapy are one component of a multi-disciplinary discharge planning process, led by the attending physician.  Recommendations may be updated based on patient status, additional functional criteria and insurance authorization.   Follow Up Recommendations  No OT follow up    Assistance Recommended at Discharge    Patient can return home with the following Assistance with cooking/housework    Functional Status Assessment  Patient has had a recent decline in their functional status and demonstrates the ability to make significant improvements in function in a reasonable and predictable amount of time.  Equipment Recommendations  None recommended by OT    Recommendations for Other Services       Precautions / Restrictions Precautions Precautions: Fall      Mobility Bed Mobility Overal bed mobility: Independent                  Transfers Overall transfer level: Independent                        Balance Overall balance assessment: Mild deficits observed, not formally tested                                          ADL either performed or assessed with clinical judgement   ADL Overall ADL's : Modified independent                                       General ADL Comments: pt reports she has been performing sponge baths at sink.  She was able to perform grooming in standing.  She is able to access bil. feet for LB dressing.  Pt inquired about showering - instructed her to discuss with MD     Vision         Perception     Praxis      Pertinent Vitals/Pain Pain Assessment Pain Assessment: Faces Faces Pain Scale: Hurts little more Pain Location: headache and Rt LE Pain Descriptors / Indicators: Aching Pain Intervention(s): Monitored during session, Repositioned     Hand Dominance Right   Extremity/Trunk Assessment Upper Extremity Assessment Upper Extremity Assessment: Overall WFL for tasks assessed   Lower Extremity Assessment Lower Extremity Assessment: Defer to PT evaluation   Cervical / Trunk Assessment Cervical / Trunk Assessment: Normal   Communication Communication Communication: No difficulties   Cognition Arousal/Alertness: Awake/alert Behavior During Therapy: WFL for tasks assessed/performed Overall Cognitive Status:  Within Functional Limits for tasks assessed                                       General Comments       Exercises     Shoulder Instructions      Home Living Family/patient expects to be discharged to:: Private residence Living Arrangements: Children;Other relatives Available Help at Discharge: Family;Available 24 hours/day Type of Home: House Home Access: Level entry     Home Layout: One level     Bathroom Shower/Tub: Teacher, early years/pre: Standard     Home Equipment: Conservation officer, nature (2 wheels);Cane - single point   Additional Comments: Pt reports she lives with her daughter, son in Sports coach and young grand children      Prior Functioning/Environment Prior Level of Function :  Independent/Modified Independent;Driving             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  Drives.  She often baby sits for her grand children.  Takes tub baths        OT Problem List: Pain      OT Treatment/Interventions:      OT Goals(Current goals can be found in the care plan section) Acute Rehab OT Goals Patient Stated Goal: to get better OT Goal Formulation: All assessment and education complete, DC therapy  OT Frequency:      Co-evaluation              AM-PAC OT "6 Clicks" Daily Activity     Outcome Measure Help from another person eating meals?: None Help from another person taking care of personal grooming?: None Help from another person toileting, which includes using toliet, bedpan, or urinal?: None Help from another person bathing (including washing, rinsing, drying)?: None Help from another person to put on and taking off regular upper body clothing?: None Help from another person to put on and taking off regular lower body clothing?: None 6 Click Score: 24   End of Session Nurse Communication: Mobility status;Other (comment) (HA.  Pt with c/o re: IV in upper Rt UE)  Activity Tolerance:   Patient left: in bed;with call bell/phone within reach  OT Visit Diagnosis: Pain Pain - part of body: Leg (head)                Time: 1458-1510 OT Time Calculation (min): 12 min Charges:  OT General Charges $OT Visit: 1 Visit OT Evaluation $OT Eval Low Complexity: 1 Low  Nilsa Nutting., OTR/L Acute Rehabilitation Services Pager [phone] Office [phone]   Walker Kehr 07/23/2022, 3:18 PM

## 2022-07-23 NOTE — Progress Notes (Signed)
Mobility Specialist Progress Note   07/23/22 1238  Mobility  Activity Ambulated independently in hallway  Level of Assistance Standby assist, set-up cues, supervision of patient - no hands on  Assistive Device Four wheel walker  Distance Ambulated (ft) 550 ft  Activity Response Tolerated well  $Mobility charge 1 Mobility   Received pt in doorway having no complaints and agreeable to mobility. Pt was asymptomatic throughout ambulation and returned to room w/o fault. Left in chair w/ call bell in reach and chair alarm on.  [name] Mobility Specialist [place] #:  [phone] Acute Rehab Office:  [phone]

## 2022-07-23 NOTE — Progress Notes (Signed)
PROGRESS NOTE    Summer Jackson  JYN:[id number] DOB: 1946-12-12 DOA: 07/20/2022 PCP: Imagene Riches, NP   Brief Narrative: Summer Jackson is a 76 y.o. female with a history of COPD, GERD, hyperlipidemia, hypertension, HFpEF. Patient presented from her orthopedic surgeon's office secondary to cellulitis from previously managed wound. Plastic surgery consulted. Empiric IV antibiotics initiated.   Assessment and Plan: * Cellulitis Mild disease. Blood cultures obtained on admission and are no growth to date. Empiric Ceftriaxone IV initiated on admission. Orthopedic surgery has consulted plastic surgery for recommendations on management. Associated right LE edema likely related to infection; venous duplex is negative. -Continue Ceftriaxone IV -Follow-up blood cultures (8/2) -Follow-up plastic surgery recommendations: plan for surgical debridement  Chronic heart failure with preserved ejection fraction (HCC) Stable. Patient is on as needed Lasix only  COPD (chronic obstructive pulmonary disease) (HCC) Stable. -Continue albuterol -Continue Breo Ellipta  Essential hypertension -Continue lisinopril  Prediabetes Hemoglobin A1C of 5.6%. Not on medication management. -Carb modified diet.  Hyperlipidemia Noted. Not currently on medication management.  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 Treated with Pradaxa and completed course.  Chronic anemia Unknown etiology. Normocytic. Hemoglobin stable. No evidence of moderate CKD. Anemia panel significant for likely iron deficiency anemia. Patient with a history of rectal bleeding. Colonoscopy from 10 years prior was unremarkable but with poor prep.    DVT prophylaxis: SCDs Code Status:   Code Status: Full Code Family Communication: None at bedside Disposition Plan: Discharge home pending orthopedic/plastic surgery  recommendations and transition to oral antibiotics   Consultants:  Orthopedic surgery Plastic surgery  Procedures:  None  Antimicrobials: Ceftriaxone IV   Subjective: Patient reports no issues overnight. States she was told she needs to continue IV antibiotics. She has been ambulating well outside of the room.   Objective: BP 117/64 (BP Location: Right Arm)   Pulse 64   Temp 98.2 F (36.8 C)   Resp 14   Ht '5\' 1"'$  (1.549 m)   Wt 80.2 kg   SpO2 100%   BMI 33.41 kg/m   Examination:  General exam: Appears calm and comfortable Respiratory system: Clear to auscultation. Respiratory effort normal. Cardiovascular system: S1 & S2 heard, RRR. No murmurs, rubs, gallops or clicks. Gastrointestinal system: Abdomen is nondistended, soft and nontender. No organomegaly or masses felt. Normal bowel sounds heard. Central nervous system: Alert and oriented. No focal neurological deficits. Musculoskeletal: RLE edema (improved). RLE anteriorly located wound with necrotic skin noted. No calf tenderness Skin: No cyanosis. No rashes Psychiatry: Judgement and insight appear normal. Mood & affect appropriate.    Data Reviewed: I have personally reviewed following labs and imaging studies  CBC Lab Results  Component Value Date   WBC 7.1 07/21/2022   RBC 3.60 (L) 07/21/2022   RBC 3.63 (L) 07/21/2022   HGB 10.3 (L) 07/21/2022   HCT 31.4 (L) 07/21/2022   MCV 87.2 07/21/2022   MCH 28.6 07/21/2022   PLT 234 07/21/2022   MCHC 32.8 07/21/2022   RDW 13.7 07/21/2022   LYMPHSABS 1.4 08/17/2021   MONOABS 0.6 08/17/2021   EOSABS 0.1 08/17/2021   BASOSABS 0.0 86/57/8469     Last metabolic panel Lab Results  Component Value Date   NA 136 07/21/2022   K 4.2 07/21/2022   CL 105 07/21/2022   CO2 23 07/21/2022   BUN 19 07/21/2022   CREATININE 0.78 07/21/2022   GLUCOSE 112 (  H) 07/21/2022   GFRNONAA >60 07/21/2022   GFRAA 105 02/14/2020   CALCIUM 8.6 (L) 07/21/2022   PROT 6.1 (L)  07/20/2022   ALBUMIN 3.5 07/20/2022   BILITOT 0.5 07/20/2022   ALKPHOS 58 07/20/2022   AST 17 07/20/2022   ALT 16 07/20/2022   ANIONGAP 8 07/21/2022    GFR: Estimated Creatinine Clearance: 59.2 mL/min (by C-G formula based on SCr of 0.78 mg/dL).  Recent Results (from the past 240 hour(s))  Culture, blood (Routine X 2) w Reflex to ID Panel     Status: None (Preliminary result)   Collection Time: 07/20/22  7:06 PM   Specimen: BLOOD  Result Value Ref Range Status   Specimen Description BLOOD RIGHT ANTECUBITAL  Final   Special Requests   Final    BOTTLES DRAWN AEROBIC AND ANAEROBIC Blood Culture adequate volume   Culture   Final    NO GROWTH 3 DAYS Performed at [facility], [address], [place], [place] [address]    Report Status PENDING  Incomplete  Culture, blood (Routine X 2) w Reflex to ID Panel     Status: None (Preliminary result)   Collection Time: 07/20/22  7:06 PM   Specimen: BLOOD LEFT HAND  Result Value Ref Range Status   Specimen Description BLOOD LEFT HAND  Final   Special Requests   Final    BOTTLES DRAWN AEROBIC AND ANAEROBIC Blood Culture results may not be optimal due to an inadequate volume of blood received in culture bottles   Culture   Final    NO GROWTH 3 DAYS Performed at [facility], [place] [address], [place],  [address]    Report Status PENDING  Incomplete      Radiology Studies: VAS [place] LOWER EXTREMITY VENOUS (DVT)  Result Date: 07/21/2022  Lower Venous DVT Study Patient Name:  Summer Jackson  Date of Exam:   07/21/2022 Medical Rec #: [id number]         Accession #:    [id number] Date of Birth: 01/31/1946         Patient Gender: F Patient Age:   81 years Exam Location:  [facility] Procedure:      VAS [place] LOWER EXTREMITY VENOUS (DVT) Referring Phys: Orma Flaming --------------------------------------------------------------------------------  Indications: Edema, Erythema, Pain, and s/p right calf injury.  Comparison Study:  02/01/2022- negative right lower extremity venous duplex Performing Technologist: Maudry Mayhew MHA, RDMS, RVT, RDCS  Examination Guidelines: A complete evaluation includes B-mode imaging, spectral Doppler, color Doppler, and power Doppler as needed of all accessible portions of each vessel. Bilateral testing is considered an integral part of a complete examination. Limited examinations for reoccurring indications may be performed as noted. The reflux portion of the exam is performed with the patient in reverse Trendelenburg.  +---------+---------------+---------+-----------+----------+--------------+ RIGHT    CompressibilityPhasicitySpontaneityPropertiesThrombus Aging +---------+---------------+---------+-----------+----------+--------------+ CFV      Full           Yes      Yes                                 +---------+---------------+---------+-----------+----------+--------------+ SFJ      Full                                                        +---------+---------------+---------+-----------+----------+--------------+  FV Prox  Full                                                        +---------+---------------+---------+-----------+----------+--------------+ FV Mid   Full                                                        +---------+---------------+---------+-----------+----------+--------------+ FV DistalFull                                                        +---------+---------------+---------+-----------+----------+--------------+ PFV      Full                                                        +---------+---------------+---------+-----------+----------+--------------+ POP      Full           Yes      Yes                                 +---------+---------------+---------+-----------+----------+--------------+ PTV      Full                                                         +---------+---------------+---------+-----------+----------+--------------+ PERO     Full                                                        +---------+---------------+---------+-----------+----------+--------------+   +----+---------------+---------+-----------+----------+--------------+ LEFTCompressibilityPhasicitySpontaneityPropertiesThrombus Aging +----+---------------+---------+-----------+----------+--------------+ CFV Full           Yes      Yes                                 +----+---------------+---------+-----------+----------+--------------+     Summary: RIGHT: - There is no evidence of deep vein thrombosis in the lower extremity.  - No cystic structure found in the popliteal fossa.  LEFT: - No evidence of common femoral vein obstruction.  *See table(s) above for measurements and observations. Electronically signed by Jamelle Haring on 07/21/2022 at 4:37:39 PM.    Final       LOS: 2 days    Cordelia Poche, MD [facility] 07/23/2022, 9:13 AM   If 7PM-7AM, please contact night-coverage [email or web address]

## 2022-07-24 DIAGNOSIS — I10 Essential (primary) hypertension: Secondary | ICD-10-CM | POA: Diagnosis not present

## 2022-07-24 DIAGNOSIS — I5032 Chronic diastolic (congestive) heart failure: Secondary | ICD-10-CM | POA: Diagnosis not present

## 2022-07-24 DIAGNOSIS — J449 Chronic obstructive pulmonary disease, unspecified: Secondary | ICD-10-CM | POA: Diagnosis not present

## 2022-07-24 DIAGNOSIS — L03115 Cellulitis of right lower limb: Secondary | ICD-10-CM | POA: Diagnosis not present

## 2022-07-24 NOTE — Progress Notes (Signed)
PROGRESS NOTE    SHUNTAVIA Summer Jackson  TDV:[id number] DOB: 1946-02-08 DOA: 07/20/2022 PCP: Imagene Riches, NP   Brief Narrative: Summer Jackson is a 76 y.o. female with a history of COPD, GERD, hyperlipidemia, hypertension, HFpEF. Patient presented from her orthopedic surgeon's office secondary to cellulitis from previously managed wound. Plastic surgery consulted. Empiric IV antibiotics initiated.   Assessment and Plan: * Cellulitis Mild disease. Blood cultures obtained on admission and are no growth to date. Empiric Ceftriaxone IV initiated on admission. Orthopedic surgery has consulted plastic surgery for recommendations on management. Associated right LE edema likely related to infection; venous duplex is negative. Cellulitis is improved except for around necrotic skin. -Continue Ceftriaxone IV -Follow-up plastic surgery recommendations: plan for surgical debridement  Chronic heart failure with preserved ejection fraction (HCC) Stable. Patient is on as needed Lasix only as an outpatient.  COPD (chronic obstructive pulmonary disease) (HCC) Stable. -Continue albuterol -Continue Breo Ellipta  Essential hypertension -Continue lisinopril  Prediabetes Hemoglobin A1C of 5.6%. Not on medication management. -Carb modified diet.  Hyperlipidemia Noted. Not currently on medication management.  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 Treated with Pradaxa and completed course.  Chronic anemia Unknown etiology. Normocytic. Hemoglobin stable. No evidence of moderate CKD. Anemia panel significant for likely iron deficiency anemia. Patient with a history of rectal bleeding. Colonoscopy from 10 years prior was unremarkable but with poor prep.    DVT prophylaxis: SCDs Code Status:   Code Status: Full Code Family Communication: None at bedside Disposition Plan: Discharge home  pending orthopedic/plastic surgery recommendations and transition to oral antibiotics   Consultants:  Orthopedic surgery Plastic surgery  Procedures:  None  Antimicrobials: Ceftriaxone IV   Subjective: Patient states no issues overnight. Still with some edema of right leg. Walked around the unit today.  Objective: BP (!) 146/116 (BP Location: Right Arm)   Pulse 75   Temp 98 F (36.7 C)   Resp 18   Ht '5\' 1"'$  (1.549 m)   Wt 80.2 kg   SpO2 94%   BMI 33.41 kg/m   Examination:  General exam: Appears calm and comfortable Respiratory system: Clear to auscultation. Respiratory effort normal. Cardiovascular system: S1 & S2 heard, RRR. No murmurs, rubs, gallops or clicks. Gastrointestinal system: Abdomen is nondistended, soft and nontender. No organomegaly or masses felt. Normal bowel sounds heard. Central nervous system: Alert and oriented. No focal neurological deficits. Musculoskeletal: RLE edema. No calf tenderness Skin: Right anterior wound with necrosis of the skin and surrounding erythema. Lower extremity erythema has otherwise improved significantly. Psychiatry: Judgement and insight appear normal. Mood & affect appropriate.    Data Reviewed: I have personally reviewed following labs and imaging studies  CBC Lab Results  Component Value Date   WBC 6.5 07/23/2022   RBC 4.08 07/23/2022   HGB 11.7 (L) 07/23/2022   HCT 37.1 07/23/2022   MCV 90.9 07/23/2022   MCH 28.7 07/23/2022   PLT 227 07/23/2022   MCHC 31.5 07/23/2022   RDW 13.5 07/23/2022   LYMPHSABS 1.4 08/17/2021   MONOABS 0.6 08/17/2021   EOSABS 0.1 08/17/2021   BASOSABS 0.0 06/13/9484     Last metabolic panel Lab Results  Component Value Date   NA 136 07/21/2022   K 4.2 07/21/2022   CL 105 07/21/2022   CO2 23 07/21/2022   BUN 19 07/21/2022   CREATININE 0.78 07/21/2022   GLUCOSE 112 (H) 07/21/2022  GFRNONAA >60 07/21/2022   GFRAA 105 02/14/2020   CALCIUM 8.6 (L) 07/21/2022   PROT 6.1 (L)  07/20/2022   ALBUMIN 3.5 07/20/2022   BILITOT 0.5 07/20/2022   ALKPHOS 58 07/20/2022   AST 17 07/20/2022   ALT 16 07/20/2022   ANIONGAP 8 07/21/2022    GFR: Estimated Creatinine Clearance: 59.2 mL/min (by C-G formula based on SCr of 0.78 mg/dL).  Recent Results (from the past 240 hour(s))  Culture, blood (Routine X 2) w Reflex to ID Panel     Status: None (Preliminary result)   Collection Time: 07/20/22  7:06 PM   Specimen: BLOOD  Result Value Ref Range Status   Specimen Description BLOOD RIGHT ANTECUBITAL  Final   Special Requests   Final    BOTTLES DRAWN AEROBIC AND ANAEROBIC Blood Culture adequate volume   Culture   Final    NO GROWTH 4 DAYS Performed at [facility], [address], [place], [place] [address]    Report Status PENDING  Incomplete  Culture, blood (Routine X 2) w Reflex to ID Panel     Status: None (Preliminary result)   Collection Time: 07/20/22  7:06 PM   Specimen: BLOOD LEFT HAND  Result Value Ref Range Status   Specimen Description BLOOD LEFT HAND  Final   Special Requests   Final    BOTTLES DRAWN AEROBIC AND ANAEROBIC Blood Culture results may not be optimal due to an inadequate volume of blood received in culture bottles   Culture   Final    NO GROWTH 4 DAYS Performed at [facility], [place] [address] [place], [place] [address]    Report Status PENDING  Incomplete      Radiology Studies: No results found.    LOS: 3 days    Cordelia Poche, MD [facility] 07/24/2022, 12:32 PM   If 7PM-7AM, please contact night-coverage [email or web address]

## 2022-07-24 NOTE — Progress Notes (Signed)
Mobility Specialist Progress Note   07/24/22 1025  Mobility  Activity Ambulated independently to bathroom  Level of Assistance Modified independent, requires aide device or extra time  Assistive Device Four wheel walker  Distance Ambulated (ft) 1100 ft  Activity Response Tolerated well  $Mobility charge 1 Mobility   Received pt in chair having no complaints and agreeable to mobility. Pt was asymptomatic throughout ambulation and returned to room w/o fault. Pt states they don't necessarily need a rollator but like it for comfort. Left in chair w/ call bell in reach and all needs met.  [name] Mobility Specialist MS [facility] #:  [phone] Acute Rehab Office:  [phone]'

## 2022-07-25 DIAGNOSIS — D649 Anemia, unspecified: Secondary | ICD-10-CM | POA: Diagnosis not present

## 2022-07-25 DIAGNOSIS — L03115 Cellulitis of right lower limb: Secondary | ICD-10-CM | POA: Diagnosis not present

## 2022-07-25 DIAGNOSIS — J449 Chronic obstructive pulmonary disease, unspecified: Secondary | ICD-10-CM | POA: Diagnosis not present

## 2022-07-25 DIAGNOSIS — I5032 Chronic diastolic (congestive) heart failure: Secondary | ICD-10-CM | POA: Diagnosis not present

## 2022-07-25 LAB — CULTURE, BLOOD (ROUTINE X 2)
Culture: NO GROWTH
Culture: NO GROWTH
Special Requests: ADEQUATE

## 2022-07-25 LAB — GLUCOSE, CAPILLARY
Glucose-Capillary: 94 mg/dL (ref 70–99)
Glucose-Capillary: 96 mg/dL (ref 70–99)
Glucose-Capillary: 96 mg/dL (ref 70–99)

## 2022-07-25 NOTE — Plan of Care (Signed)
  Problem: Clinical Measurements: Goal: Respiratory complications will improve Outcome: Progressing   Problem: Activity: Goal: Risk for activity intolerance will decrease Outcome: Progressing   Problem: Education: Goal: Knowledge of General Education information will improve Description: Including pain rating scale, medication(s)/side effects and non-pharmacologic comfort measures Outcome: Not Progressing   Problem: Clinical Measurements: Goal: Will remain free from infection Outcome: Not Progressing

## 2022-07-25 NOTE — Plan of Care (Signed)
  Problem: Clinical Measurements: Goal: Ability to maintain clinical measurements within normal limits will improve Outcome: Progressing Goal: Will remain free from infection Outcome: Progressing Goal: Diagnostic test results will improve Outcome: Progressing Goal: Respiratory complications will improve Outcome: Progressing Goal: Cardiovascular complication will be avoided Outcome: Progressing   Problem: Elimination: Goal: Will not experience complications related to bowel motility Outcome: Progressing Goal: Will not experience complications related to urinary retention Outcome: Progressing   Problem: Pain Managment: Goal: General experience of comfort will improve Outcome: Progressing

## 2022-07-25 NOTE — Progress Notes (Signed)
PROGRESS NOTE    Summer Jackson  XNA:[id number] DOB: 09-Oct-1946 DOA: 07/20/2022 PCP: Imagene Riches, NP   Brief Narrative: Summer Jackson is a 76 y.o. female with a history of COPD, GERD, hyperlipidemia, hypertension, HFpEF. Patient presented from her orthopedic surgeon's office secondary to cellulitis from previously managed wound. Plastic surgery consulted. Empiric IV antibiotics initiated.   Assessment and Plan: * Cellulitis Mild disease. Blood cultures obtained on admission and are no growth to date. Empiric Ceftriaxone IV initiated on admission. Orthopedic surgery has consulted plastic surgery for recommendations on management. Associated right LE edema likely related to infection; venous duplex is negative. Cellulitis is improved except for around necrotic skin. -Continue Ceftriaxone IV -Follow-up plastic surgery recommendations: plan for surgical debridement; it appears patient is scheduled for debridement on 8/9  Chronic heart failure with preserved ejection fraction (HCC) Stable. Patient is on as needed Lasix only as an outpatient.  COPD (chronic obstructive pulmonary disease) (HCC) Stable. -Continue albuterol -Continue Breo Ellipta  Essential hypertension -Continue lisinopril  Prediabetes Hemoglobin A1C of 5.6%. Not on medication management. -Carb modified diet.  Hyperlipidemia Noted. Not currently on medication management.  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 Treated with Pradaxa and completed course.  Chronic anemia Unknown etiology. Normocytic. Hemoglobin stable. No evidence of moderate CKD. Anemia panel significant for likely iron deficiency anemia. Patient with a history of rectal bleeding. Colonoscopy from 10 years prior was unremarkable but with poor prep. Hemoglobin is stable.    DVT prophylaxis: SCDs Code Status:   Code Status: Full  Code Family Communication: None at bedside Disposition Plan: Discharge home pending orthopedic/plastic surgery recommendations and transition to oral antibiotics   Consultants:  Orthopedic surgery Plastic surgery  Procedures:  None  Antimicrobials: Ceftriaxone IV   Subjective: No new concerns. She has been walking. She is happy to see her swelling is improved from initial presentation.  Objective: BP 134/69 (BP Location: Right Arm)   Pulse 65   Temp 97.7 F (36.5 C) (Oral)   Resp 17   Ht '5\' 1"'$  (1.549 m)   Wt 80.2 kg   SpO2 99%   BMI 33.41 kg/m   Examination:  General exam: Appears calm and comfortable Respiratory system: Respiratory effort normal. Central nervous system: Alert and oriented. No focal neurological deficits. Musculoskeletal: RLE edema without tenderness or significant warmth Skin: Blanchable erythema of right lower leg Psychiatry: Judgement and insight appear normal. Mood & affect appropriate.   Data Reviewed: I have personally reviewed following labs and imaging studies  CBC Lab Results  Component Value Date   WBC 6.5 07/23/2022   RBC 4.08 07/23/2022   HGB 11.7 (L) 07/23/2022   HCT 37.1 07/23/2022   MCV 90.9 07/23/2022   MCH 28.7 07/23/2022   PLT 227 07/23/2022   MCHC 31.5 07/23/2022   RDW 13.5 07/23/2022   LYMPHSABS 1.4 08/17/2021   MONOABS 0.6 08/17/2021   EOSABS 0.1 08/17/2021   BASOSABS 0.0 54/27/0623     Last metabolic panel Lab Results  Component Value Date   NA 136 07/21/2022   K 4.2 07/21/2022   CL 105 07/21/2022   CO2 23 07/21/2022   BUN 19 07/21/2022   CREATININE 0.78 07/21/2022   GLUCOSE 112 (H) 07/21/2022   GFRNONAA >60 07/21/2022   GFRAA 105 02/14/2020   CALCIUM 8.6 (L) 07/21/2022   PROT 6.1 (L) 07/20/2022   ALBUMIN 3.5 07/20/2022   BILITOT 0.5  07/20/2022   ALKPHOS 58 07/20/2022   AST 17 07/20/2022   ALT 16 07/20/2022   ANIONGAP 8 07/21/2022    GFR: Estimated Creatinine Clearance: 59.2 mL/min (by C-G  formula based on SCr of 0.78 mg/dL).  Recent Results (from the past 240 hour(s))  Culture, blood (Routine X 2) w Reflex to ID Panel     Status: None   Collection Time: 07/20/22  7:06 PM   Specimen: BLOOD  Result Value Ref Range Status   Specimen Description BLOOD RIGHT ANTECUBITAL  Final   Special Requests   Final    BOTTLES DRAWN AEROBIC AND ANAEROBIC Blood Culture adequate volume   Culture   Final    NO GROWTH 5 DAYS Performed at [facility], [address] [place], [place] [address]    Report Status 07/25/2022 FINAL  Final  Culture, blood (Routine X 2) w Reflex to ID Panel     Status: None   Collection Time: 07/20/22  7:06 PM   Specimen: BLOOD LEFT HAND  Result Value Ref Range Status   Specimen Description BLOOD LEFT HAND  Final   Special Requests   Final    BOTTLES DRAWN AEROBIC AND ANAEROBIC Blood Culture results may not be optimal due to an inadequate volume of blood received in culture bottles   Culture   Final    NO GROWTH 5 DAYS Performed at [facility], [place] [address], [place], [place] [address]    Report Status 07/25/2022 FINAL  Final      Radiology Studies: No results found.    LOS: 4 days    Cordelia Poche, MD [facility] 07/25/2022, 11:39 AM   If 7PM-7AM, please contact night-coverage [email or web address]

## 2022-07-25 NOTE — Care Management Important Message (Signed)
Important Message  Patient Details  Name: Summer Jackson MRN: [id number] Date of Birth: 1946/12/05   Medicare Important Message Given:  Yes     Hannah Beat 07/25/2022, 1:24 PM

## 2022-07-26 DIAGNOSIS — J449 Chronic obstructive pulmonary disease, unspecified: Secondary | ICD-10-CM | POA: Diagnosis not present

## 2022-07-26 DIAGNOSIS — I5032 Chronic diastolic (congestive) heart failure: Secondary | ICD-10-CM | POA: Diagnosis not present

## 2022-07-26 DIAGNOSIS — L03115 Cellulitis of right lower limb: Secondary | ICD-10-CM | POA: Diagnosis not present

## 2022-07-26 DIAGNOSIS — D649 Anemia, unspecified: Secondary | ICD-10-CM | POA: Diagnosis not present

## 2022-07-26 MED ORDER — CHLORHEXIDINE GLUCONATE CLOTH 2 % EX PADS: 6.0000 | MEDICATED_PAD | Freq: Once | CUTANEOUS | Status: DC

## 2022-07-26 MED ORDER — LORATADINE 10 MG PO TABS
10.0000 mg | ORAL_TABLET | Freq: Every day | ORAL | Status: DC
Start: 2022-07-26 — End: 2022-07-27
  Administered 2022-07-26 – 2022-07-27 (×2): 10 mg via ORAL
  Filled 2022-07-26 (×2): qty 1

## 2022-07-26 NOTE — Plan of Care (Signed)
  Problem: Pain Managment: Goal: General experience of comfort will improve Outcome: Progressing   Problem: Skin Integrity: Goal: Risk for impaired skin integrity will decrease Outcome: Progressing   

## 2022-07-26 NOTE — Progress Notes (Signed)
PROGRESS NOTE    Summer Jackson  ZDG:[id number] DOB: 1946/02/19 DOA: 07/20/2022 PCP: Imagene Riches, NP   Brief Narrative: Summer Jackson is a 76 y.o. female with a history of COPD, GERD, hyperlipidemia, hypertension, HFpEF. Patient presented from her orthopedic surgeon's office secondary to cellulitis from previously managed wound. Plastic surgery consulted. Empiric IV antibiotics initiated.   Assessment and Plan: * Cellulitis Mild disease. Blood cultures obtained on admission and are no growth to date. Empiric Ceftriaxone IV initiated on admission. Orthopedic surgery has consulted plastic surgery for recommendations on management. Associated right LE edema likely related to infection; venous duplex is negative. Cellulitis is improved except for around necrotic skin. -Continue Ceftriaxone IV -Follow-up plastic surgery recommendations: plan for surgical debridement; it appears patient is scheduled for debridement on 8/9  Chronic heart failure with preserved ejection fraction (HCC) Stable. Patient is on as needed Lasix only as an outpatient.  COPD (chronic obstructive pulmonary disease) (HCC) Stable. -Continue albuterol -Continue Breo Ellipta  Essential hypertension -Continue lisinopril  Prediabetes Hemoglobin A1C of 5.6%. Not on medication management. -Carb modified diet.  Hyperlipidemia Noted. Not currently on medication management.  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 Treated with Pradaxa and completed course.  Chronic anemia Unknown etiology. Normocytic. Hemoglobin stable. No evidence of moderate CKD. Anemia panel significant for likely iron deficiency anemia. Patient with a history of rectal bleeding. Colonoscopy from 10 years prior was unremarkable but with poor prep. Hemoglobin is stable.  Headache Possibly related to sinus infection per patient.  Mostly occipital. Already on antibiotics for cellulitis, which should cover any potential sinus infection. -Symptomatic management -Claritin -Continue Flonase     DVT prophylaxis: SCDs Code Status:   Code Status: Full Code Family Communication: None at bedside Disposition Plan: Discharge home pending orthopedic/plastic surgery recommendations and transition to oral antibiotics   Consultants:  Orthopedic surgery Plastic surgery  Procedures:  None  Antimicrobials: Ceftriaxone IV   Subjective: Patient reports a headache and sinus pain concerning for a sinus infection. Patient reports having allergy issues seasonally. No other concerns.  Objective: BP (!) 142/74 (BP Location: Right Arm)   Pulse 73   Temp (!) 97.5 F (36.4 C)   Resp 17   Ht '5\' 1"'$  (1.549 m)   Wt 80.2 kg   SpO2 99%   BMI 33.41 kg/m   Examination:  General exam: Appears calm and comfortable  Respiratory system: Respiratory effort normal. Central nervous system: Alert and oriented. No focal neurological deficits. Musculoskeletal: Mild RLE edema. No calf tenderness Skin: Significantly improved RLE erythema. Psychiatry: Judgement and insight appear normal. Mood & affect appropriate.    Data Reviewed: I have personally reviewed following labs and imaging studies  CBC Lab Results  Component Value Date   WBC 6.5 07/23/2022   RBC 4.08 07/23/2022   HGB 11.7 (L) 07/23/2022   HCT 37.1 07/23/2022   MCV 90.9 07/23/2022   MCH 28.7 07/23/2022   PLT 227 07/23/2022   MCHC 31.5 07/23/2022   RDW 13.5 07/23/2022   LYMPHSABS 1.4 08/17/2021   MONOABS 0.6 08/17/2021   EOSABS 0.1 08/17/2021   BASOSABS 0.0 95/18/8416     Last metabolic panel Lab Results  Component Value Date   NA 136 07/21/2022   K 4.2 07/21/2022   CL 105 07/21/2022   CO2 23 07/21/2022   BUN 19 07/21/2022   CREATININE 0.78 07/21/2022   GLUCOSE 112 (H) 07/21/2022  GFRNONAA >60 07/21/2022   GFRAA 105 02/14/2020   CALCIUM 8.6 (L)  07/21/2022   PROT 6.1 (L) 07/20/2022   ALBUMIN 3.5 07/20/2022   BILITOT 0.5 07/20/2022   ALKPHOS 58 07/20/2022   AST 17 07/20/2022   ALT 16 07/20/2022   ANIONGAP 8 07/21/2022    GFR: Estimated Creatinine Clearance: 59.2 mL/min (by C-G formula based on SCr of 0.78 mg/dL).  Recent Results (from the past 240 hour(s))  Culture, blood (Routine X 2) w Reflex to ID Panel     Status: None   Collection Time: 07/20/22  7:06 PM   Specimen: BLOOD  Result Value Ref Range Status   Specimen Description BLOOD RIGHT ANTECUBITAL  Final   Special Requests   Final    BOTTLES DRAWN AEROBIC AND ANAEROBIC Blood Culture adequate volume   Culture   Final    NO GROWTH 5 DAYS Performed at [facility], [address], [place], [place] [address]    Report Status 07/25/2022 FINAL  Final  Culture, blood (Routine X 2) w Reflex to ID Panel     Status: None   Collection Time: 07/20/22  7:06 PM   Specimen: BLOOD LEFT HAND  Result Value Ref Range Status   Specimen Description BLOOD LEFT HAND  Final   Special Requests   Final    BOTTLES DRAWN AEROBIC AND ANAEROBIC Blood Culture results may not be optimal due to an inadequate volume of blood received in culture bottles   Culture   Final    NO GROWTH 5 DAYS Performed at [facility], [place] [address] [place],  [address]    Report Status 07/25/2022 FINAL  Final      Radiology Studies: No results found.    LOS: 5 days    Cordelia Poche, MD [facility] 07/26/2022, 10:54 AM   If 7PM-7AM, please contact night-coverage [email or web address]

## 2022-07-26 NOTE — Assessment & Plan Note (Addendum)
Seems resolved at this point Follow outpatient, follow outpatient as needed

## 2022-07-26 NOTE — Progress Notes (Signed)
Mobility Specialist Progress Note   07/26/22 1050  Mobility  Activity Ambulated with assistance in hallway  Level of Assistance Modified independent, requires aide device or extra time  Assistive Device Four wheel walker  Distance Ambulated (ft) 1650 ft  Activity Response Tolerated well  $Mobility charge 1 Mobility   Pt present in bed w/ HA and mild RLE pain but agreeable to mobility. No faults during ambulation, returned back to bed w/ call bell in reach and needs met.   [name] Mobility Specialist MS [facility] #:  [phone] Acute Rehab Office:  [phone]

## 2022-07-27 ENCOUNTER — Encounter (HOSPITAL_COMMUNITY): Payer: Self-pay | Admitting: Anesthesiology

## 2022-07-27 ENCOUNTER — Encounter (HOSPITAL_COMMUNITY)
Disposition: A | Discharge status: Home / Self Care | Payer: Self-pay | Source: Ambulatory Visit | Attending: Family Medicine

## 2022-07-27 DIAGNOSIS — L03115 Cellulitis of right lower limb: Secondary | ICD-10-CM | POA: Diagnosis not present

## 2022-07-27 DIAGNOSIS — S81801A Unspecified open wound, right lower leg, initial encounter: Secondary | ICD-10-CM | POA: Diagnosis not present

## 2022-07-27 DIAGNOSIS — W228XXA Striking against or struck by other objects, initial encounter: Secondary | ICD-10-CM | POA: Diagnosis not present

## 2022-07-27 LAB — SURGICAL PCR SCREEN
MRSA, PCR: NEGATIVE
Staphylococcus aureus: POSITIVE — AB

## 2022-07-27 SURGERY — IRRIGATION AND DEBRIDEMENT WOUND
Anesthesia: General | Laterality: Right

## 2022-07-27 MED ORDER — CEFADROXIL 500 MG PO CAPS: 500.0000 mg | ORAL_CAPSULE | Freq: Two times a day (BID) | ORAL | 0 refills | Status: AC

## 2022-07-27 MED ORDER — MUPIROCIN 2 % EX OINT: 1.0000 | TOPICAL_OINTMENT | Freq: Two times a day (BID) | CUTANEOUS | Status: DC

## 2022-07-27 MED ORDER — MUPIROCIN 2 % EX OINT
1.0000 | TOPICAL_OINTMENT | Freq: Two times a day (BID) | CUTANEOUS | Status: DC
  Administered 2022-07-27: 1 via NASAL
  Filled 2022-07-27: qty 22

## 2022-07-27 MED ORDER — CHLORHEXIDINE GLUCONATE CLOTH 2 % EX PADS: 6.0000 | MEDICATED_PAD | Freq: Every day | CUTANEOUS | Status: DC

## 2022-07-27 MED ORDER — BACITRACIN ZINC 500 UNIT/GM EX OINT: TOPICAL_OINTMENT | Freq: Every day | CUTANEOUS | 0 refills | Status: DC

## 2022-07-27 SURGICAL SUPPLY — 49 items
APPLICATOR COTTON TIP 6 STRL (MISCELLANEOUS) IMPLANT
APPLICATOR COTTON TIP 6IN STRL (MISCELLANEOUS)
BAG COUNTER SPONGE SURGICOUNT (BAG) ×2 IMPLANT
BAG DECANTER FOR FLEXI CONT (MISCELLANEOUS) IMPLANT
BENZOIN TINCTURE PRP APPL 2/3 (GAUZE/BANDAGES/DRESSINGS) ×2 IMPLANT
CANISTER SUCT 3000ML PPV (MISCELLANEOUS) ×2 IMPLANT
CNTNR URN SCR LID CUP LEK RST (MISCELLANEOUS) IMPLANT
CONT SPEC 4OZ STRL OR WHT (MISCELLANEOUS)
COVER SURGICAL LIGHT HANDLE (MISCELLANEOUS) ×2 IMPLANT
DRAIN CHANNEL 19F RND (DRAIN) IMPLANT
DRAIN JP 10F RND SILICONE (MISCELLANEOUS) IMPLANT
DRAPE HALF SHEET 40X57 (DRAPES) IMPLANT
DRAPE IMP U-DRAPE 54X76 (DRAPES) ×2 IMPLANT
DRAPE INCISE IOBAN 66X45 STRL (DRAPES) IMPLANT
DRAPE LAPAROSCOPIC ABDOMINAL (DRAPES) IMPLANT
DRAPE LAPAROTOMY 100X72 PEDS (DRAPES) ×2 IMPLANT
DRSG ADAPTIC 3X8 NADH LF (GAUZE/BANDAGES/DRESSINGS) IMPLANT
DRSG PAD ABDOMINAL 8X10 ST (GAUZE/BANDAGES/DRESSINGS) IMPLANT
DRSG VAC ATS LRG SENSATRAC (GAUZE/BANDAGES/DRESSINGS) IMPLANT
DRSG VAC ATS MED SENSATRAC (GAUZE/BANDAGES/DRESSINGS) IMPLANT
DRSG VAC ATS SM SENSATRAC (GAUZE/BANDAGES/DRESSINGS) IMPLANT
ELECT CAUTERY BLADE 6.4 (BLADE) IMPLANT
ELECT REM PT RETURN 9FT ADLT (ELECTROSURGICAL) ×2
ELECTRODE REM PT RTRN 9FT ADLT (ELECTROSURGICAL) ×1 IMPLANT
GAUZE SPONGE 4X4 12PLY STRL (GAUZE/BANDAGES/DRESSINGS) IMPLANT
GLOVE BIO SURGEON STRL SZ 6.5 (GLOVE) ×2 IMPLANT
GLOVE BIOGEL M 6.5 STRL (GLOVE) ×2 IMPLANT
GOWN STRL REUS W/ TWL LRG LVL3 (GOWN DISPOSABLE) ×3 IMPLANT
GOWN STRL REUS W/TWL LRG LVL3 (GOWN DISPOSABLE) ×3
KIT BASIN OR (CUSTOM PROCEDURE TRAY) ×2 IMPLANT
KIT TURNOVER KIT B (KITS) ×2 IMPLANT
NEEDLE HYPO 25GX1X1/2 BEV (NEEDLE) ×2 IMPLANT
NS IRRIG 1000ML POUR BTL (IV SOLUTION) ×2 IMPLANT
PACK GENERAL/GYN (CUSTOM PROCEDURE TRAY) ×2 IMPLANT
PACK UNIVERSAL I (CUSTOM PROCEDURE TRAY) ×2 IMPLANT
PAD ARMBOARD 7.5X6 YLW CONV (MISCELLANEOUS) ×4 IMPLANT
STAPLER VISISTAT 35W (STAPLE) ×2 IMPLANT
SURGILUBE 2OZ TUBE FLIPTOP (MISCELLANEOUS) IMPLANT
SUT MNCRL AB 3-0 PS2 27 (SUTURE) IMPLANT
SUT MNCRL AB 4-0 PS2 18 (SUTURE) IMPLANT
SUT MON AB 2-0 CT1 36 (SUTURE) IMPLANT
SUT MON AB 5-0 PS2 18 (SUTURE) IMPLANT
SUT VIC AB 5-0 PS2 18 (SUTURE) IMPLANT
SUT VICRYL 3 0 (SUTURE) IMPLANT
SWAB COLLECTION DEVICE MRSA (MISCELLANEOUS) IMPLANT
SWAB CULTURE ESWAB REG 1ML (MISCELLANEOUS) IMPLANT
SYR CONTROL 10ML LL (SYRINGE) ×2 IMPLANT
TOWEL GREEN STERILE (TOWEL DISPOSABLE) ×2 IMPLANT
UNDERPAD 30X36 HEAVY ABSORB (UNDERPADS AND DIAPERS) ×2 IMPLANT

## 2022-07-27 NOTE — Progress Notes (Signed)
Doc. Canceled today's surgery. Stated will try again Monday or pt. Will be sent home

## 2022-07-27 NOTE — Progress Notes (Signed)
Mobility Specialist Progress Note   07/27/22 1045  Mobility  Activity Ambulated independently in hallway  Level of Assistance Modified independent, requires aide device or extra time  Assistive Device Front wheel walker  Distance Ambulated (ft) 1100 ft  Activity Response Tolerated well  $Mobility charge 1 Mobility   Received pt in doorway having no complaints and agreeable to mobility. Pt was asymptomatic throughout ambulation and returned to room w/o fault. Left in chair w/ call bell in reach and all needs met.  [name] Mobility Specialist MS [facility] #:  [phone] Acute Rehab Office:  [phone]

## 2022-07-27 NOTE — Discharge Summary (Signed)
Physician Discharge Summary  Summer Jackson:[id number] DOB: March 07, 1946 DOA: 07/20/2022  PCP: Imagene Riches, NP  Admit date: 07/20/2022 Discharge date: 07/27/2022  Time spent: 40 minutes  Recommendations for Outpatient Follow-up:  Follow outpatient CBC/CMP  Follow wound outpatient Follow with plastics surgery outpatient   Discharge Diagnoses:  Principal Problem:   Cellulitis Active Problems:   Chronic heart failure with preserved ejection fraction (HCC)   COPD (chronic obstructive pulmonary disease) (HCC)   Essential hypertension   Prediabetes   Hyperlipidemia   GERD (gastroesophageal reflux disease)   History of DVT (deep vein thrombosis)   Headache   Obesity (BMI 30-39.9)   Chronic anemia   Discharge Condition: stable  Diet recommendation: heart healthy  Filed Weights   07/22/22 2048  Weight: 80.2 kg    History of present illness:  Summer Jackson is Summer Jackson 76 y.o. female with Marvell Tamer history of COPD, GERD, hyperlipidemia, hypertension, HFpEF. Patient presented from her orthopedic surgeon's office secondary to cellulitis from previously managed wound. Plastic surgery consulted. Empiric IV antibiotics initiated.  Plan was for surgery 8/9, but she ate this morning, so plan is for outpatient plastics intervention.  Will discharge on oral abx with plan for outpatient wound care and plastics follow up.  See below for additional details  Hospital Course:  Assessment and Plan: * Cellulitis Afebrile, no leukocytosis Erythema seems improved from prior images Plastics surgery planning for outpatient operative intervention Will discharge with duricef (pcn allergy noted, she's tolerated ceftriaxone here), further abx per plastics LE [place] negative in RLE Dressing changes with bacitracin, adaptic daily  Chronic heart failure with preserved ejection fraction (HCC) Stable. Patient is on as needed Lasix only as an outpatient.  COPD (chronic obstructive pulmonary disease)  (HCC) Stable. -Continue albuterol -Continue Breo Ellipta  Essential hypertension -Continue lisinopril  Prediabetes Hemoglobin A1C of 5.6%. Not on medication management. -Carb modified diet.  Hyperlipidemia Noted. Not currently on medication management.  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 Treated with Pradaxa and completed course.  Chronic anemia Unknown etiology. Normocytic. Hemoglobin stable. No evidence of moderate CKD. Anemia panel significant for likely iron deficiency anemia. Patient with Bralon Antkowiak history of rectal bleeding. Colonoscopy from 10 years prior was unremarkable but with poor prep. Hemoglobin is stable.  Headache Seems resolved at this point Follow outpatient, follow outpatient as needed     Procedures: LE [place] Summary:  RIGHT:  - There is no evidence of deep vein thrombosis in the lower extremity.     - No cystic structure found in the popliteal fossa.     LEFT:  - No evidence of common femoral vein obstruction.   Consultations: Plastics orthopedics  Discharge Exam: Vitals:   07/27/22 0827 07/27/22 1300  BP: 123/65 126/65  Pulse: 73 65  Resp: 17 18  Temp: 98 F (36.7 C) 98 F (36.7 C)  SpO2: 100% 100%   No complaints Eager to go home  General: No acute distress. Cardiovascular: RRR Lungs: unlabored Neurological: Alert and oriented 3. Moves all extremities 4. Cranial nerves II through XII grossly intact. Extremities: erythema to RLE appears improved from prior imaging, no crepitus/fluctuance, necrotic appearing flap  Discharge Instructions   Discharge Instructions     Call MD for:  difficulty breathing, headache or visual disturbances   Complete by: As directed    Call MD for:  extreme fatigue   Complete by: As directed    Call  MD for:  hives   Complete by: As directed    Call MD for:  persistant dizziness or  light-headedness   Complete by: As directed    Call MD for:  persistant nausea and vomiting   Complete by: As directed    Call MD for:  redness, tenderness, or signs of infection (pain, swelling, redness, odor or green/yellow discharge around incision site)   Complete by: As directed    Call MD for:  severe uncontrolled pain   Complete by: As directed    Call MD for:  temperature >100.4   Complete by: As directed    Diet - low sodium heart healthy   Complete by: As directed    Discharge instructions   Complete by: As directed    You were seen for Jaidalyn Schillo right leg skin infection and necrotic skin flap.  You were seen by plastics.  They're planning to follow up with you outpatient.    We'll send you out with antibiotics to continue until you follow up with plastics.  Continue wound care with bacitracin daily and Tymier Lindholm nonstick dressing (adaptic dressing).   Return for new, recurrent, or worsening symptoms.  Please ask your PCP to request records from this hospitalization so they know what was done and what the next steps will be.   Discharge wound care:   Complete by: As directed    wound care as previously recommended with bacitracin, Adaptic dressing  Follow up with plastics surgery for wound care   Increase activity slowly   Complete by: As directed       Allergies as of 07/27/2022       Reactions   Sulfa Antibiotics Swelling, Rash   Xarelto [rivaroxaban] Hives, Itching   Amoxicillin Other (See Comments)   REACTION: headache   Azithromycin Other (See Comments)   REACTION: reddness around mouth   Clindamycin/lincomycin Hives   Erythromycin Hives   Penicillins Other (See Comments)   REACTION: headache Has patient had Ziquan Fidel PCN reaction causing immediate rash, facial/tongue/throat swelling, SOB or lightheadedness with hypotension: No Has patient had Kylei Purington PCN reaction causing severe rash involving mucus membranes or skin necrosis: No Has patient had Vieno Tarrant PCN reaction that required  hospitalization No Has patient had Sehaj Mcenroe PCN reaction occurring within the last 10 years: No If all of the above answers are NO, then may proceed with Cephalosporin use. Tolerated Cephalosporin Date: 09/14/21.   Propoxyphene N-acetaminophen Other (See Comments)   REACTION: mouth sores   Clarithromycin Rash   REACTION: skin rash with minimal vesicular formation on arms and hands        Medication List     STOP taking these medications    doxycycline 100 MG tablet Commonly known as: ADOXA   methocarbamol 500 MG tablet Commonly known as: ROBAXIN       TAKE these medications    albuterol (2.5 MG/3ML) 0.083% nebulizer solution Commonly known as: PROVENTIL Take 3 mLs (2.5 mg total) by nebulization every 6 (six) hours as needed for wheezing or shortness of breath. What changed: Another medication with the same name was changed. Make sure you understand how and when to take each.   albuterol 108 (90 Base) MCG/ACT inhaler Commonly known as: VENTOLIN HFA Can inhale two puffs every four to six hours as needed for cough or wheeze. What changed:  how much to take how to take this when to take this reasons to take this additional instructions   aspirin EC 81 MG tablet Take 81 mg  by mouth every morning. Swallow whole.   bacitracin ointment Apply topically daily. bacitracin, Adaptic dressing daily Start taking on: July 28, 2022   baclofen 10 MG tablet Commonly known as: LIORESAL Take 10 mg by mouth 3 (three) times daily as needed for muscle spasms.   Breo Ellipta 200-25 MCG/ACT Aepb Generic drug: fluticasone furoate-vilanterol Inhale 1 puff into the lungs daily as needed (sob & wheexzing).   cefadroxil 500 MG capsule Commonly known as: DURICEF Take 1 capsule (500 mg total) by mouth 2 (two) times daily for 7 days. Follow up with plastic surgery regarding long term antibiotic plan   ciclopirox 8 % solution Commonly known as: Penlac Apply topically at bedtime. Apply over  nail and surrounding skin. Apply daily over previous coat. After seven (7) days, may remove with alcohol and continue cycle.   fluticasone 50 MCG/ACT nasal spray Commonly known as: FLONASE Place 1-2 sprays into both nostrils daily as needed for allergies.   furosemide 20 MG tablet Commonly known as: LASIX Take 1 tablet (20 mg total) by mouth daily as needed.   lisinopril 20 MG tablet Commonly known as: ZESTRIL Take 1 tablet by mouth once daily   pantoprazole 40 MG tablet Commonly known as: PROTONIX Take 40 mg by mouth daily.               Discharge Care Instructions  (From admission, onward)           Start     Ordered   07/27/22 0000  Discharge wound care:       Comments: wound care as previously recommended with bacitracin, Adaptic dressing  Follow up with plastics surgery for wound care   07/27/22 1450           Allergies  Allergen Reactions   Sulfa Antibiotics Swelling and Rash   Xarelto [Rivaroxaban] Hives and Itching   Amoxicillin Other (See Comments)    REACTION: headache   Azithromycin Other (See Comments)    REACTION: reddness around mouth   Clindamycin/Lincomycin Hives   Erythromycin Hives   Penicillins Other (See Comments)    REACTION: headache Has patient had Claretta Kendra PCN reaction causing immediate rash, facial/tongue/throat swelling, SOB or lightheadedness with hypotension: No Has patient had Jinny Sweetland PCN reaction causing severe rash involving mucus membranes or skin necrosis: No Has patient had Katanya Schlie PCN reaction that required hospitalization No Has patient had Anayiah Howden PCN reaction occurring within the last 10 years: No If all of the above answers are NO, then may proceed with Cephalosporin use.  Tolerated Cephalosporin Date: 09/14/21.   Propoxyphene N-Acetaminophen Other (See Comments)    REACTION: mouth sores   Clarithromycin Rash    REACTION: skin rash with minimal vesicular formation on arms and hands      The results of significant diagnostics from  this hospitalization (including imaging, microbiology, ancillary and laboratory) are listed below for reference.    Significant Diagnostic Studies: VAS [place] LOWER EXTREMITY VENOUS (DVT)  Result Date: 07/21/2022  Lower Venous DVT Study Patient Name:  TOMASA DOBRANSKY  Date of Exam:   07/21/2022 Medical Rec #: [id number]         Accession #:    [id number] Date of Birth: 1946-12-12         Patient Gender: F Patient Age:   76 years Exam Location:  [facility] Procedure:      VAS [place] LOWER EXTREMITY VENOUS (DVT) Referring Phys: Orma Flaming --------------------------------------------------------------------------------  Indications: Edema, Erythema, Pain, and s/p right calf injury.  Comparison Study: 02/01/2022- negative right lower extremity venous duplex Performing Technologist: Maudry Mayhew MHA, RDMS, RVT, RDCS  Examination Guidelines: Tynleigh Birt complete evaluation includes B-mode imaging, spectral Doppler, color Doppler, and power Doppler as needed of all accessible portions of each vessel. Bilateral testing is considered an integral part of Anastazja Isaac complete examination. Limited examinations for reoccurring indications may be performed as noted. The reflux portion of the exam is performed with the patient in reverse Trendelenburg.  +---------+---------------+---------+-----------+----------+--------------+ RIGHT    CompressibilityPhasicitySpontaneityPropertiesThrombus Aging +---------+---------------+---------+-----------+----------+--------------+ CFV      Full           Yes      Yes                                 +---------+---------------+---------+-----------+----------+--------------+ SFJ      Full                                                        +---------+---------------+---------+-----------+----------+--------------+ FV Prox  Full                                                        +---------+---------------+---------+-----------+----------+--------------+ FV Mid   Full                                                         +---------+---------------+---------+-----------+----------+--------------+ FV DistalFull                                                        +---------+---------------+---------+-----------+----------+--------------+ PFV      Full                                                        +---------+---------------+---------+-----------+----------+--------------+ POP      Full           Yes      Yes                                 +---------+---------------+---------+-----------+----------+--------------+ PTV      Full                                                        +---------+---------------+---------+-----------+----------+--------------+ PERO     Full                                                        +---------+---------------+---------+-----------+----------+--------------+   +----+---------------+---------+-----------+----------+--------------+  LEFTCompressibilityPhasicitySpontaneityPropertiesThrombus Aging +----+---------------+---------+-----------+----------+--------------+ CFV Full           Yes      Yes                                 +----+---------------+---------+-----------+----------+--------------+     Summary: RIGHT: - There is no evidence of deep vein thrombosis in the lower extremity.  - No cystic structure found in the popliteal fossa.  LEFT: - No evidence of common femoral vein obstruction.  *See table(s) above for measurements and observations. Electronically signed by Jamelle Haring on 07/21/2022 at 4:37:39 PM.    Final     Microbiology: Recent Results (from the past 240 hour(s))  Culture, blood (Routine X 2) w Reflex to ID Panel     Status: None   Collection Time: 07/20/22  7:06 PM   Specimen: BLOOD  Result Value Ref Range Status   Specimen Description BLOOD RIGHT ANTECUBITAL  Final   Special Requests   Final    BOTTLES DRAWN AEROBIC AND ANAEROBIC Blood Culture  adequate volume   Culture   Final    NO GROWTH 5 DAYS Performed at [facility], [address], [place], [place] [address]    Report Status 07/25/2022 FINAL  Final  Culture, blood (Routine X 2) w Reflex to ID Panel     Status: None   Collection Time: 07/20/22  7:06 PM   Specimen: BLOOD LEFT HAND  Result Value Ref Range Status   Specimen Description BLOOD LEFT HAND  Final   Special Requests   Final    BOTTLES DRAWN AEROBIC AND ANAEROBIC Blood Culture results may not be optimal due to an inadequate volume of blood received in culture bottles   Culture   Final    NO GROWTH 5 DAYS Performed at [facility], [place] [address], [place], [place] [address]    Report Status 07/25/2022 FINAL  Final  Surgical pcr screen     Status: Abnormal   Collection Time: 07/26/22 10:03 PM   Specimen: Nasal Mucosa; Nasal Swab  Result Value Ref Range Status   MRSA, PCR NEGATIVE NEGATIVE Final   Staphylococcus aureus POSITIVE (Julliana Whitmyer) NEGATIVE Final    Comment: (NOTE) The Xpert SA Assay (FDA approved for NASAL specimens in patients 36 years of age and older), is one component of Dora Clauss comprehensive surveillance program. It is not intended to diagnose infection nor to guide or monitor treatment. Performed at [facility], [place] [address], [place], [place] [address]      Labs: Basic Metabolic Panel: Recent Labs  Lab 07/20/22 1857 07/21/22 0157  NA 135 136  K 3.9 4.2  CL 102 105  CO2 25 23  GLUCOSE 107* 112*  BUN 22 19  CREATININE 0.74 0.78  CALCIUM 8.9 8.6*   Liver Function Tests: Recent Labs  Lab 07/20/22 1857  AST 17  ALT 16  ALKPHOS 58  BILITOT 0.5  PROT 6.1*  ALBUMIN 3.5   No results for input(s): "LIPASE", "AMYLASE" in the last 168 hours. No results for input(s): "AMMONIA" in the last 168 hours. CBC: Recent Labs  Lab 07/20/22 1857 07/21/22 0157 07/23/22 0942  WBC 8.4 7.1 6.5  HGB 11.5* 10.3* 11.7*  HCT 35.8* 31.4* 37.1  MCV 87.7 87.2 90.9  PLT 276 234 227    Cardiac Enzymes: No results for input(s): "CKTOTAL", "CKMB", "CKMBINDEX", "TROPONINI" in the last 168 hours. BNP: BNP (last  3 results) No results for input(s): "BNP" in the last 8760 hours.  ProBNP (last 3 results) No results for input(s): "PROBNP" in the last 8760 hours.  CBG: Recent Labs  Lab 07/25/22 0729 07/25/22 1213 07/25/22 1644  GLUCAP 94 96 96       Signed:  Fayrene Helper MD.  [facility] 07/27/2022, 3:12 PM

## 2022-07-27 NOTE — Progress Notes (Signed)
good morning pt. stated that she can not wait this long  NPO for surgery and ate " one graham cracker and 4 small cheezit with a few sips of water. She was instructed numerous times not eat and explain why

## 2022-07-27 NOTE — Anesthesia Preprocedure Evaluation (Deleted)
Anesthesia Evaluation    Airway        Dental   Pulmonary former smoker,           Cardiovascular hypertension,      Neuro/Psych    GI/Hepatic   Endo/Other    Renal/GU      Musculoskeletal   Abdominal   Peds  Hematology   Anesthesia Other Findings   Reproductive/Obstetrics                             Anesthesia Physical Anesthesia Plan  ASA:   Anesthesia Plan:    Post-op Pain Management:    Induction:   PONV Risk Score and Plan:   Airway Management Planned:   Additional Equipment:   Intra-op Plan:   Post-operative Plan:   Informed Consent:   Plan Discussed with:   Anesthesia Plan Comments: (Cancelled by surgeon due to NPO status)        Anesthesia Quick Evaluation

## 2022-07-27 NOTE — Progress Notes (Cosign Needed Addendum)
Day of Surgery  Subjective: [name] was scheduled to have surgery this afternoon with wound excision with placement of myriad.  She was made n.p.o. and understood that she was not supposed to eat, but notes that she was unable to accomplish this and needed to eat something this morning.  She is unable to go to the OR today for this reason.  I did evaluate the patient today, she notes she is doing well she notes her swelling in her right lower extremity has improved.  She denies any fevers now or throughout her hospitalization.  Objective: Vital signs in last 24 hours: Temp:  [98 F (36.7 C)-98.2 F (36.8 C)] 98 F (36.7 C) (08/09 0827) Pulse Rate:  [64-73] 73 (08/09 0827) Resp:  [16-17] 17 (08/09 0827) BP: (113-138)/(56-82) 123/65 (08/09 0827) SpO2:  [98 %-100 %] 100 % (08/09 0827) Last BM Date : 07/26/22   Physical Exam:  General: Pt resting comfortably in no acute distress  Right leg wound along the anterior aspect with skin flap necrosis, surrounding redness, no purulence, minimal edema     Assessment/Plan: s/p Procedure(s): RIGHT LEG DEBRIDEMENT WITH MYRIAD PLACEMENT   Unfortunately missed Summer Jackson was unable to adhere to the n.p.o. recommendations and ate this morning.  She did understand that she was not supposed to.  Upon evaluation today her wound does appear markedly improved over her initial presentation.  We would continue recommendations for operative intervention, as early as next Monday.  If the patient does not require inpatient management we are happy to place her on the OR schedule next Monday as an outpatient.  Please continue wound care as previously recommended with bacitracin, Adaptic dressing.  Please feel free to reach out to [place] with any further questions or concerns.   LOS: 6 days    Elmer Ramp, PA-C 07/27/2022

## 2022-07-27 NOTE — Progress Notes (Signed)
Mobility Specialist Progress Note   07/27/22 1230  Mobility  Activity Ambulated independently in hallway  Level of Assistance Modified independent, requires aide device or extra time  Assistive Device Front wheel walker  Distance Ambulated (ft) 550 ft  Activity Response Tolerated well  $Mobility charge 1 Mobility   Pt requesting to ambulate again before they d/c home. No complaints throughout, returned back to room w/o fault.  [name] Mobility Specialist MS [facility] #:  [phone] Acute Rehab Office:  [phone]

## 2022-08-01 ENCOUNTER — Telehealth: Payer: Self-pay | Admitting: Physician Assistant

## 2022-08-01 ENCOUNTER — Ambulatory Visit (INDEPENDENT_AMBULATORY_CARE_PROVIDER_SITE_OTHER): Payer: Medicare Other | Admitting: Physician Assistant

## 2022-08-01 ENCOUNTER — Encounter: Payer: Self-pay | Admitting: Physician Assistant

## 2022-08-01 DIAGNOSIS — L03115 Cellulitis of right lower limb: Secondary | ICD-10-CM | POA: Diagnosis not present

## 2022-08-01 DIAGNOSIS — S81801A Unspecified open wound, right lower leg, initial encounter: Secondary | ICD-10-CM

## 2022-08-01 NOTE — Progress Notes (Addendum)
Referring Provider York, Regina F, NP [address] [place],  [place] [address]   CC:  Chief Complaint  Patient presents with   Follow-up      Summer Jackson is an 76 y.o. female.  HPI: This is a 76-year-old female seen in our office for follow-up evaluation status post hospitalization for right lower extremity wound.  The patient was originally seen as an inpatient after she suffered an injury to her right lower extremity.  The wound was repaired primarily at an urgent care, she did develop an infection and was placed on several antibiotics which she failed oral antibiotics as an outpatient.  She was brought to the hospital where she received IV antibiotics.  Her infection did respond well to the antibiotics, we did plan for OR management with excision of devitalized tissue and placement of myriad.  The patient did not want to remain n.p.o. until surgery and ate the morning of.  Given her significant improvement with oral antibiotics she was discharged home and arranged to follow-up in our office.  Since her discharge she has been doing well she denies any fever, she notes the redness has improved but still has some minimal redness around the wound site.  She reports she has continued to take the Duricef prescribed at the time of discharge.  She was discharged home on a 10-day regimen.  Review of Systems General: Negative for fever  Physical Exam    07/27/2022    1:00 PM 07/27/2022    8:27 AM 07/27/2022    5:24 AM  Vitals with BMI  Systolic 126 123 113  Diastolic 65 65 56  Pulse 65 73 64    General:  No acute distress,  Alert and oriented, Non-Toxic, Normal speech and affect Right leg wound along the anterior aspect with skin flap necrosis, surrounding redness, no purulence, minimal edema, sutures in place.,  Distal pedal pulses intact    Assessment/Plan  This is a 76-year-old female seen in our office for follow-up evaluation status post hospitalization for right lower extremity wound.   Overall she continues to progress, the infection is improving with reduced redness and swelling of the right lower extremity.  I did remove half of her sutures that were in place.  She continues to use the bacitracin ointment with nonstick dressing.  She will continue daily dressing changes.  We will see her back in our office on September 1 for reevaluation.  She will reach out to our office if she continues to have any redness or signs of infection at the end of her antibiotic course.  She understands that she should return to our office immediately if she develops any new or worsening signs or symptoms.  Jeffrey Todd Hedges 08/01/2022, 12:11 PM         

## 2022-08-08 ENCOUNTER — Telehealth: Payer: Self-pay | Admitting: *Deleted

## 2022-08-08 NOTE — Telephone Encounter (Signed)
Mrs. Summer Jackson called and spoke to our nurse here in the office today.  I returned her phone call she notes that over the weekend she was having rather severe pain in her leg from her ankle to her hip, she also notes that she has had significant edema in the right lower extremity.  She has had issues with this previously but notes that it has increased over the last several days.  When asked how her wound is doing she notes that her primary care provider remove some devitalized tissue in the office the other day, she notes that it is not as red as it was during her hospitalization.  She denies any other infectious symptoms.  After discussing the lower extremity swelling with her further she does note history of blood clots previously.  She denies any chest pain or shortness of breath presently.  Given the reported swelling in the lower extremity and her previous history of blood clot I did recommend that she follow-up in the emergency room for ultrasound of her right lower extremity and evaluation of the edema in her right lower extremity.  She does not want to go to the emergency room at this time, she notes that she will reach out to her primary care provider and discuss seeing her.  She understands the importance of close follow-up evaluation for this.  She is scheduled in our office in a little over a week, we will be happy to see her sooner but at this time it does seem prudent to have her seen more immediately for her ongoing lower extremity edema.  To stand she can reach out to our office at anytime with any further questions or concerns she may have.

## 2022-08-08 NOTE — Telephone Encounter (Signed)
I spoke with her and put a note in the chart. I did recommend she be seen in the ER for evaluation of the increased lower extremity swelling given her previous history of DVT. She does not want to do that and feels that she can see her PCP instead

## 2022-08-08 NOTE — Telephone Encounter (Signed)
Call received from Butch Penny, case manager with [facility] following up with pt s/p discharge home from hospital. She states pt complained of right leg swollen and painful up to her thigh and right foot "swollen over her shoe". Patient's next appointment is 9/1 with Dr. Marla Roe. Pt would like a call back to advise if she needs to be seen sooner. [phone]

## 2022-08-09 ENCOUNTER — Telehealth: Payer: Self-pay | Admitting: Plastic Surgery

## 2022-08-09 ENCOUNTER — Telehealth: Payer: Self-pay | Admitting: *Deleted

## 2022-08-09 NOTE — Telephone Encounter (Signed)
Ptn transferred to me - I told her that we have no openings for 2 weeks for Dr. Keturah Barre - we will call if there is an opening.

## 2022-08-09 NOTE — Telephone Encounter (Signed)
Call received from Butch Penny, case manager with [facility]. States she spoke with pt and pt did go to her PCP yesterday and then went to [facility] ED. Reports neg for DVT and stitches were removed in ED. Pt wants to know if she needs to keep her 9/1 appointment with Dr. Marla Roe or if we need to see her sooner. Please advise

## 2022-08-10 NOTE — Telephone Encounter (Signed)
Were we able to get in touch with her for a follow up visit

## 2022-08-12 ENCOUNTER — Telehealth: Payer: Self-pay | Admitting: Plastic Surgery

## 2022-08-12 ENCOUNTER — Telehealth: Payer: Self-pay | Admitting: *Deleted

## 2022-08-12 NOTE — Telephone Encounter (Signed)
Pt states she just got dx with a staph infection and would like a staff member to call her back asap for clarification on antibiotic and appt on 08/19/22.

## 2022-08-12 NOTE — Telephone Encounter (Signed)
Spoke with pt in response to earlier message. States she saw her PCP yesterday who changed her bandage and told her she had a staph infection in her leg wound and for her to use "iodine" on it. Pt requesting clarification. Call back # [phone]

## 2022-08-12 NOTE — Telephone Encounter (Addendum)
Called and spoke with patient, she reports that she saw her primary care physician and her primary care provider said that she had a staph infection of her leg.  I notified the patient that she did have a culture swab in the hospital of her nose which was positive for Staph aureus, we discussed that it is possible her leg could be colonized with staph as well but we do not have a culture to confirm that.  She reports that her PCP refilled her prescription for cefadroxil.  She reports that she has been putting iodine on the wound as recommended by her PCP.  She denies any infectious symptoms, denies any cardiac or pulmonary symptoms.  She reports that she is currently spending some time with her 75-monthold grandson.  She denies any significant change to the wound, reports that some of the top layer of skin has continued to peel.  She denies any surrounding redness.  She does not report any foul odors.  She has an appointment next week with Dr. DMarla Roefor reevaluation.  I discussed with the patient to continue with dressing changes per our instructions and to continue with antibiotics.  We will plan to see her on 08/19/2022, I discussed with her that if she notices any changes that concern her or any changes in symptoms she should seek evaluation by our office.  Patient was understanding and agreeable to this.  All of her questions were answered to her content.

## 2022-08-19 ENCOUNTER — Ambulatory Visit (INDEPENDENT_AMBULATORY_CARE_PROVIDER_SITE_OTHER): Payer: Medicare Other | Admitting: Plastic Surgery

## 2022-08-19 DIAGNOSIS — W228XXA Striking against or struck by other objects, initial encounter: Secondary | ICD-10-CM

## 2022-08-19 DIAGNOSIS — Z86718 Personal history of other venous thrombosis and embolism: Secondary | ICD-10-CM

## 2022-08-19 DIAGNOSIS — R7303 Prediabetes: Secondary | ICD-10-CM

## 2022-08-19 DIAGNOSIS — R600 Localized edema: Secondary | ICD-10-CM

## 2022-08-19 DIAGNOSIS — S81801A Unspecified open wound, right lower leg, initial encounter: Secondary | ICD-10-CM | POA: Diagnosis not present

## 2022-08-19 NOTE — Progress Notes (Signed)
   Subjective:    Patient ID: Summer Jackson, female    DOB: 16-Jul-1946, 76 y.o.   MRN: [id number]   The patient is a 76 year old female here for further evaluation of her right leg.  She accidentally struck it on a door and skin loss.  She was seen in urgent care and treated with antibiotics.  It got quite red and swollen.  She was evaluated for DVT given her previous history.  She was seen by her PCP who diagnosed her with a staph infection based on a swab culture from the hospital of her nose.  She has now been using Betadine to clean the wound.  Today it looks like it has improved.  There is less redness and less swelling than previously noted.  She does have some loss of tissue with about a 3 cm defect.  There is some start of epithelialization so I want to give it another week.  She may need some ACell or myriad.     Review of Systems  Constitutional:  Positive for activity change. Negative for appetite change.  Eyes: Negative.   Respiratory: Negative.  Negative for chest tightness.   Cardiovascular:  Positive for leg swelling.  Gastrointestinal: Negative.   Endocrine: Negative.   Musculoskeletal: Negative.   Skin:  Positive for color change and wound.       Objective:   Physical Exam Constitutional:      Appearance: Normal appearance.  Cardiovascular:     Rate and Rhythm: Normal rate.  Musculoskeletal:        General: Swelling present.  Skin:    General: Skin is warm.     Capillary Refill: Capillary refill takes less than 2 seconds.     Findings: Bruising, erythema and lesion present.  Neurological:     Mental Status: She is alert and oriented to person, place, and time.  Psychiatric:        Mood and Affect: Mood normal.        Behavior: Behavior normal.        Thought Content: Thought content normal.        Judgment: Judgment normal.        Assessment & Plan:     ICD-10-CM   1. Prediabetes  R73.03     2. History of DVT (deep vein thrombosis)  Z86.718      3. Bilateral leg edema  R60.0     4. Wound of right lower extremity, initial encounter  S81.801A        I recommend no Betadine.  She can wash it in the shower.  I gave her a silver dressing to put on there to see if we can calm down the irritation.  She should wrap her leg with a Kerlix starting at her toes.  She was told not to use an Ace wrap.  I also recommend compression stockings that she says she does have at home.  I would like to see her back next week.

## 2022-08-25 ENCOUNTER — Telehealth: Payer: Self-pay | Admitting: Plastic Surgery

## 2022-08-25 ENCOUNTER — Telehealth: Payer: Self-pay | Admitting: Student

## 2022-08-25 NOTE — Telephone Encounter (Signed)
Patient called and stated she needs to get a recliner through her insurance and would like to know if she can obtain a prescription for this.

## 2022-08-26 ENCOUNTER — Ambulatory Visit (INDEPENDENT_AMBULATORY_CARE_PROVIDER_SITE_OTHER): Payer: Medicare Other | Admitting: Student

## 2022-08-26 DIAGNOSIS — S81801A Unspecified open wound, right lower leg, initial encounter: Secondary | ICD-10-CM | POA: Diagnosis not present

## 2022-08-26 NOTE — Progress Notes (Signed)
   Referring Provider Rhea Bleacher, NP [place],  [place] [address]   CC:  Chief Complaint  Patient presents with   Follow-up      Summer Jackson is an 76 y.o. female.  HPI: Patient is a 76 year old female with history of a right lower extremity wound.  Patient presents to the clinic today for follow-up.  Patient was last seen in the clinic on 08/19/2022 by Dr. Marla Roe.  At this visit, the wound had looked like it had improved.  A silver dressing was given to the patient to put over the wound to see if this would help with irritation.  It was recommended to the patient that she wrap her leg with Kerlix and use a compression stocking.  Plan was for patient to follow-up in 1 week.  Today patient reports she is doing okay.  She reports that she has been applying Xeroform daily to the wound.  Patient states she has some pain and swelling near the wound.  She states that she has been wearing her compression sock.  Patient denies any fevers, chills or SOB.  Patient also asking about a prescription for a recliner.  Review of Systems General: denies fevers, chills, SOB  Physical Exam    07/27/2022    1:00 PM 07/27/2022    8:27 AM 07/27/2022    5:24 AM  Vitals with BMI  Systolic 092 330 076  Diastolic 65 65 56  Pulse 65 73 64    General:  No acute distress,  Alert and oriented, Non-Toxic, Normal speech and affect On exam, patient able to ambulate upon entering the room. She is sitting on the exam table in no acute distress. The wound to her right lower extremity is approximately 4 cm x 2-1/2 cm.  There is some surrounding swelling and some mild erythema, but there is no malodor or purulent drainage noted on exam.  There is some fibrinous exudate noted centrally to the wound.  She has some swelling to her right lower extremity.   Dr. Marla Roe also had the opportunity to examine the patient.  Redness and swelling appears to be improved from previous exam per Dr.  Marla Roe.  Assessment/Plan  Wound of right lower extremity, initial encounter   Plan is to take the patient to the operating room next week or so for debridement and placement of myriad.  Patient was in agreement with this plan.  I discussed with the patient to apply Vaseline and a nonadherent dressing daily over the wound.  Patient expressed understanding.  We recommended that the patient follow-up with her primary care provider for a prescription for her recliner.  Pictures were obtained of the patient and placed in the chart with the patient's or guardian's permission.   Discussed with the patient that she can continue her aspirin until we have a surgical date, and once we have a date we will let her know when to hold her aspirin. Patient expressed understanding.  Dr. Marla Roe aware of this plan.   Surgical clearance will be sent to patient's PCP.   Clance Boll 08/26/2022, 3:38 PM

## 2022-08-27 ENCOUNTER — Telehealth: Payer: Self-pay | Admitting: *Deleted

## 2022-08-27 NOTE — Telephone Encounter (Signed)
No auth req per [facility] portal for CPT [place], [id number], [id number] - [id number]

## 2022-08-29 ENCOUNTER — Ambulatory Visit: Payer: Medicare Other | Admitting: Podiatry

## 2022-08-29 ENCOUNTER — Encounter (HOSPITAL_BASED_OUTPATIENT_CLINIC_OR_DEPARTMENT_OTHER): Payer: Self-pay | Admitting: Plastic Surgery

## 2022-08-30 ENCOUNTER — Encounter (HOSPITAL_BASED_OUTPATIENT_CLINIC_OR_DEPARTMENT_OTHER): Payer: Self-pay | Admitting: Plastic Surgery

## 2022-08-30 ENCOUNTER — Other Ambulatory Visit: Payer: Self-pay

## 2022-08-31 ENCOUNTER — Telehealth: Payer: Self-pay | Admitting: Student

## 2022-08-31 ENCOUNTER — Ambulatory Visit (INDEPENDENT_AMBULATORY_CARE_PROVIDER_SITE_OTHER): Payer: Medicare Other | Admitting: Physician Assistant

## 2022-08-31 ENCOUNTER — Encounter: Payer: Self-pay | Admitting: Physician Assistant

## 2022-08-31 VITALS — BP 126/73 | HR 75 | Ht 61.0 in | Wt 163.0 lb

## 2022-08-31 DIAGNOSIS — S81801A Unspecified open wound, right lower leg, initial encounter: Secondary | ICD-10-CM

## 2022-08-31 NOTE — Telephone Encounter (Signed)
I called the patient in regards to her surgery on Friday. I discussed with the patient she should hold her aspirin from now until surgery. Patient reports she has not taken it all week. I discussed with the patient to continue to hold it.

## 2022-08-31 NOTE — Progress Notes (Signed)
Patient ID: Summer Jackson, female    DOB: 1946-07-12, 76 y.o.   MRN: [id number]  Chief Complaint  Patient presents with   Pre-op Exam    No diagnosis found.   History of Present Illness:   Summer Jackson is a 76 y.o.  female  with a history of right lower extremity wound.  She presents for preoperative evaluation for upcoming procedure, right leg wound excision with placement of myriad, scheduled for 09/02/2022 with Dr. Marla Roe.  The patient has not had problems with anesthesia.  She has had previous lumbar surgery  Summary of Previous Visit: She was most recently seen in the office on 08/26/2022.  In short the patient was originally seen as an outpatient after striking her right lower extremity in a car door.  She was placed on antibiotics and had outpatient repair.  She failed outpatient antibiotics and was managed as an inpatient.  There was an original plan for myriad placement while hospitalized but the patient did not want to wait to eat and ate breakfast the morning of the planned surgery.  She has been followed as an outpatient.  She was most recently seen on 08/26/2022.  At that time she had been doing well, she did have some improvement in her overall lower extremity but did have some localized irritation.  Job: Retired  [employer] Significant for: Prediabetes, right lower extremity wound   Past Medical History: Allergies: Allergies  Allergen Reactions   Sulfa Antibiotics Swelling and Rash   Xarelto [Rivaroxaban] Hives and Itching   Amoxicillin Other (See Comments)    REACTION: headache   Azithromycin Other (See Comments)    REACTION: reddness around mouth   Clindamycin/Lincomycin Hives   Erythromycin Hives   Penicillins Other (See Comments)    REACTION: headache Has patient had a PCN reaction causing immediate rash, facial/tongue/throat swelling, SOB or lightheadedness with hypotension: No Has patient had a PCN reaction causing severe rash involving mucus membranes or  skin necrosis: No Has patient had a PCN reaction that required hospitalization No Has patient had a PCN reaction occurring within the last 10 years: No If all of the above answers are NO, then may proceed with Cephalosporin use.  Tolerated Cephalosporin Date: 09/14/21.   Propoxyphene N-Acetaminophen Other (See Comments)    REACTION: mouth sores   Clarithromycin Rash    REACTION: skin rash with minimal vesicular formation on arms and hands    Current Medications:  Current Outpatient Medications:    albuterol (PROVENTIL) (2.5 MG/3ML) 0.083% nebulizer solution, Take 3 mLs (2.5 mg total) by nebulization every 6 (six) hours as needed for wheezing or shortness of breath., Disp: 75 mL, Rfl: 12   albuterol (VENTOLIN HFA) 108 (90 Base) MCG/ACT inhaler, Can inhale two puffs every four to six hours as needed for cough or wheeze. (Patient taking differently: Inhale 2 puffs into the lungs every 4 (four) hours as needed for wheezing (cough).), Disp: 18 g, Rfl: 1   aspirin EC 81 MG tablet, Take 81 mg by mouth every morning. Swallow whole., Disp: , Rfl:    bacitracin ointment, Apply topically daily. bacitracin, Adaptic dressing daily, Disp: 120 g, Rfl: 0   baclofen (LIORESAL) 10 MG tablet, Take 10 mg by mouth 3 (three) times daily as needed for muscle spasms., Disp: , Rfl:    BREO ELLIPTA 200-25 MCG/ACT AEPB, Inhale 1 puff into the lungs daily as needed (sob & wheexzing)., Disp: , Rfl:    ciclopirox (PENLAC) 8 % solution, Apply  topically at bedtime. Apply over nail and surrounding skin. Apply daily over previous coat. After seven (7) days, may remove with alcohol and continue cycle., Disp: 6.6 mL, Rfl: 2   fluticasone (FLONASE) 50 MCG/ACT nasal spray, Place 1-2 sprays into both nostrils daily as needed for allergies., Disp: , Rfl:    furosemide (LASIX) 20 MG tablet, Take 1 tablet (20 mg total) by mouth daily as needed., Disp: 90 tablet, Rfl: 1   lisinopril (ZESTRIL) 20 MG tablet, Take 1 tablet by mouth once  daily, Disp: 90 tablet, Rfl: 3   pantoprazole (PROTONIX) 40 MG tablet, Take 40 mg by mouth daily., Disp: , Rfl:   Past Medical Problems: 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   Qualifier: Diagnosis of  By: Radene Ou MD, [place] DISEASE, 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   Annotation: 1957 Qualifier: History of  By: Radene Ou MD, [place]     Constipation 05/29/2013   COPD (chronic obstructive pulmonary disease) (HCC)    Exertional shortness of breath    Foreign body sensation in throat 12/16/2016   Gastroesophageal reflux disease with esophagitis 12/16/2016   GERD (gastroesophageal reflux disease)    Headache 03/20/2018   migraine Overview:  Overview:  migraine migraine   Headache(784.0)    migraine    Hemorrhoids    History of stomach ulcers 1990's   HYPERLIPIDEMIA 08/25/2007   Qualifier: Diagnosis of  By: Radene Ou MD, [place]     HYPERTENSION 08/09/2007   Qualifier: Diagnosis of  By: Radene Ou MD, [place]     Idiopathic peripheral autonomic neuropathy 03/27/2019   Left lower quadrant pain 09/05/2018   Left shoulder pain 02/05/2013   Lipoma of abdominal wall 01/23/2012   Nicotine dependence 11/23/2018   Pharyngeal dysphagia 12/16/2016   Prediabetes 01/10/2013   Rectal bleeding    Scapular dyskinesis 03/26/2018   SHINGLES, RECURRENT 09/30/2008   Qualifier: Diagnosis of  By: Radene Ou MD, [place]     Spondylolisthesis, lumbar region 04/10/2019   Added automatically from request for surgery [id number]   Sternal pain 03/26/2018   Synovial cyst of lumbar facet joint 04/08/2019   Urinary frequency    Weight loss    Wheezing     Past Surgical History: Past Surgical History:  Procedure Laterality Date   APPENDECTOMY  1970's   BACK SURGERY  04/2019   COLONOSCOPY     COLONOSCOPY   12/22/2011   Procedure: COLONOSCOPY;  Surgeon: Lear Ng, MD;  Location: [facility];  Service: Endoscopy;  Laterality: N/A;   heart surgery at 64yr 1955   "blood was going thru my lungs but not thru my heart like it should" (05/09/2013)   KNEE ARTHROSCOPY Left ~ 2004; ~ 2006   LEFT OOPHORECTOMY Left ~ [place] 02/15/2012   Procedure: EXCISION LIPOMA;  Surgeon: TJoyice Faster Cornett, MD;  [facility] [facility]  Service: General;  Laterality: Left;  excision lipoma Left Lower abdomen   TOTAL KNEE ARTHROPLASTY Left 05/08/2013   TOTAL KNEE ARTHROPLASTY Left 05/08/2013   Procedure: TOTAL KNEE ARTHROPLASTY;  Surgeon: DNinetta Lights MD;  [place] [facility]  Service: Orthopedics;  Laterality: Left;   TOTAL KNEE ARTHROPLASTY Right 09/13/2021   Procedure: TOTAL KNEE ARTHROPLASTY;  Surgeon: AGaynelle Arabian MD;  Location: [facility];  Service: Orthopedics;  Laterality: Right;   TUBAL LIGATION Left ~ [place] Left ~ 1984   "under breast" (05/09/2013)    Social History: Social History   Socioeconomic History   Marital status: Divorced    Spouse name: Not on file   Number of children: 2   Years of education: Not on file   Highest education level: Not on file  Occupational History   Occupation: cleaner    Comment: cleans apts and homes  Tobacco Use   Smoking status: Former    Packs/day: 2.00    Years: 35.00    Total pack years: 70.00    Types: Cigarettes    Quit date: 08/20/2013    Years since quitting: 9.0   Smokeless tobacco: Never   Tobacco comments:    Currently exposed to 2nd hand smoke in home   Vaping Use   Vaping Use: Never used  Substance and Sexual Activity   Alcohol use: No   Drug use: No   Sexual activity: Not Currently    Birth control/protection: Post-menopausal  Other Topics Concern   Not on file  Social History Narrative   Lives with her daughter's father.   Social Determinants of Health   Financial Resource Strain: Not on  file  Food Insecurity: Not on file  Transportation Needs: Not on file  Physical Activity: Not on file  Stress: Not on file  Social Connections: Not on file  Intimate Partner Violence: Not on file    Family History: Family History  Problem Relation Age of Onset   Colon cancer Father    Cancer Father        colon   Heart disease Father    Heart disease Paternal Uncle    Diabetes Sister    Malignant hyperthermia Neg Hx     Review of Systems: ROS Negative for fever Physical Exam: Vital Signs BP 126/73 (BP Location: Left Arm, Patient Position: Sitting, Cuff Size: Large)   Pulse 75   Ht _0  (1.549 m)   Wt 163 lb (73.9 kg)   SpO2 98%   BMI 30.80 kg/m   Physical Exam  Constitutional:      General: Not in acute distress.    Appearance: Normal appearance. Not ill-appearing.  HENT:     Head: Normocephalic and atraumatic.  Eyes:     Pupils: Pupils are equal, round. Cardiovascular:     Rate and Rhythm: Normal rate. Pulmonary:     Effort: No respiratory distress or increased work of breathing.  Speaks in full sentences. Musculoskeletal: Normal range of motion. No lower extremity swelling or edema.  Varicose veins Skin:    General: Skin is warm and dry.     Findings: 4 cm x 2.5 cm wound to the right lower extremity with some surrounding swelling and mild erythema, no purulence.  Some fibrinous exudate noted within the wound and some general edema in the right lower extremity Neurological:     Mental Status: Alert and oriented to person, place, and time.  Psychiatric:        Mood and Affect: Mood normal.        Behavior: Behavior normal.    Assessment/Plan: The patient is scheduled for wound excision with placement of myriad with Dr. Marla Roe.  Risks, benefits, and alternatives of procedure discussed, questions answered and consent obtained.    Smoking Status: Non-smoker   Caprini Score: 6; Risk Factors include: Age, BMI, varicose veins, leg edema; Recommendation  is  early ambulation  Pictures obtained: Today  Post-op Rx sent to pharmacy: No prescription sent  Patient was provided with the  General Surgical Risk consent document and Pain Medication Agreement prior to their appointment.  They had adequate time to read through the risk consent documents and Pain Medication Agreement. We also discussed them in person together during this preop appointment. All of their questions were answered to their satisfaction.  Recommended calling if they have any further questions.  Risk consent form and Pain Medication Agreement to be scanned into patient's chart.     Electronically signed by: Stevie Kern Deano Tomaszewski, PA-C 08/31/2022 5:00 PM

## 2022-08-31 NOTE — H&P (View-Only) (Signed)
Patient ID: Summer Jackson, female    DOB: 1946-07-12, 76 y.o.   MRN: [id number]  Chief Complaint  Patient presents with   Pre-op Exam    No diagnosis found.   History of Present Illness:   Summer Jackson is a 76 y.o.  female  with a history of right lower extremity wound.  She presents for preoperative evaluation for upcoming procedure, right leg wound excision with placement of myriad, scheduled for 09/02/2022 with Dr. Marla Roe.  The patient has not had problems with anesthesia.  She has had previous lumbar surgery  Summary of Previous Visit: She was most recently seen in the office on 08/26/2022.  In short the patient was originally seen as an outpatient after striking her right lower extremity in a car door.  She was placed on antibiotics and had outpatient repair.  She failed outpatient antibiotics and was managed as an inpatient.  There was an original plan for myriad placement while hospitalized but the patient did not want to wait to eat and ate breakfast the morning of the planned surgery.  She has been followed as an outpatient.  She was most recently seen on 08/26/2022.  At that time she had been doing well, she did have some improvement in her overall lower extremity but did have some localized irritation.  Job: Retired  [employer] Significant for: Prediabetes, right lower extremity wound   Past Medical History: Allergies: Allergies  Allergen Reactions   Sulfa Antibiotics Swelling and Rash   Xarelto [Rivaroxaban] Hives and Itching   Amoxicillin Other (See Comments)    REACTION: headache   Azithromycin Other (See Comments)    REACTION: reddness around mouth   Clindamycin/Lincomycin Hives   Erythromycin Hives   Penicillins Other (See Comments)    REACTION: headache Has patient had a PCN reaction causing immediate rash, facial/tongue/throat swelling, SOB or lightheadedness with hypotension: No Has patient had a PCN reaction causing severe rash involving mucus membranes or  skin necrosis: No Has patient had a PCN reaction that required hospitalization No Has patient had a PCN reaction occurring within the last 10 years: No If all of the above answers are NO, then may proceed with Cephalosporin use.  Tolerated Cephalosporin Date: 09/14/21.   Propoxyphene N-Acetaminophen Other (See Comments)    REACTION: mouth sores   Clarithromycin Rash    REACTION: skin rash with minimal vesicular formation on arms and hands    Current Medications:  Current Outpatient Medications:    albuterol (PROVENTIL) (2.5 MG/3ML) 0.083% nebulizer solution, Take 3 mLs (2.5 mg total) by nebulization every 6 (six) hours as needed for wheezing or shortness of breath., Disp: 75 mL, Rfl: 12   albuterol (VENTOLIN HFA) 108 (90 Base) MCG/ACT inhaler, Can inhale two puffs every four to six hours as needed for cough or wheeze. (Patient taking differently: Inhale 2 puffs into the lungs every 4 (four) hours as needed for wheezing (cough).), Disp: 18 g, Rfl: 1   aspirin EC 81 MG tablet, Take 81 mg by mouth every morning. Swallow whole., Disp: , Rfl:    bacitracin ointment, Apply topically daily. bacitracin, Adaptic dressing daily, Disp: 120 g, Rfl: 0   baclofen (LIORESAL) 10 MG tablet, Take 10 mg by mouth 3 (three) times daily as needed for muscle spasms., Disp: , Rfl:    BREO ELLIPTA 200-25 MCG/ACT AEPB, Inhale 1 puff into the lungs daily as needed (sob & wheexzing)., Disp: , Rfl:    ciclopirox (PENLAC) 8 % solution, Apply  topically at bedtime. Apply over nail and surrounding skin. Apply daily over previous coat. After seven (7) days, may remove with alcohol and continue cycle., Disp: 6.6 mL, Rfl: 2   fluticasone (FLONASE) 50 MCG/ACT nasal spray, Place 1-2 sprays into both nostrils daily as needed for allergies., Disp: , Rfl:    furosemide (LASIX) 20 MG tablet, Take 1 tablet (20 mg total) by mouth daily as needed., Disp: 90 tablet, Rfl: 1   lisinopril (ZESTRIL) 20 MG tablet, Take 1 tablet by mouth once  daily, Disp: 90 tablet, Rfl: 3   pantoprazole (PROTONIX) 40 MG tablet, Take 40 mg by mouth daily., Disp: , Rfl:   Past Medical Problems: 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   Qualifier: Diagnosis of  By: Summer Jackson, [place] DISEASE, 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   Annotation: 1957 Qualifier: History of  By: Summer Jackson, [place]     Constipation 05/29/2013   COPD (chronic obstructive pulmonary disease) (HCC)    Exertional shortness of breath    Foreign body sensation in throat 12/16/2016   Gastroesophageal reflux disease with esophagitis 12/16/2016   GERD (gastroesophageal reflux disease)    Headache 03/20/2018   migraine Overview:  Overview:  migraine migraine   Headache(784.0)    migraine    Hemorrhoids    History of stomach ulcers 1990's   HYPERLIPIDEMIA 08/25/2007   Qualifier: Diagnosis of  By: Summer Jackson, [place]     HYPERTENSION 08/09/2007   Qualifier: Diagnosis of  By: Summer Jackson, [place]     Idiopathic peripheral autonomic neuropathy 03/27/2019   Left lower quadrant pain 09/05/2018   Left shoulder pain 02/05/2013   Lipoma of abdominal wall 01/23/2012   Nicotine dependence 11/23/2018   Pharyngeal dysphagia 12/16/2016   Prediabetes 01/10/2013   Rectal bleeding    Scapular dyskinesis 03/26/2018   SHINGLES, RECURRENT 09/30/2008   Qualifier: Diagnosis of  By: Summer Jackson, [place]     Spondylolisthesis, lumbar region 04/10/2019   Added automatically from request for surgery [id number]   Sternal pain 03/26/2018   Synovial cyst of lumbar facet joint 04/08/2019   Urinary frequency    Weight loss    Wheezing     Past Surgical History: Past Surgical History:  Procedure Laterality Date   APPENDECTOMY  1970's   BACK SURGERY  04/2019   COLONOSCOPY     COLONOSCOPY   12/22/2011   Procedure: COLONOSCOPY;  Surgeon: Lear Ng, Jackson;  Location: [facility];  Service: Endoscopy;  Laterality: N/A;   heart surgery at 64yr 1955   "blood was going thru my lungs but not thru my heart like it should" (05/09/2013)   KNEE ARTHROSCOPY Left ~ 2004; ~ 2006   LEFT OOPHORECTOMY Left ~ [place] 02/15/2012   Procedure: EXCISION LIPOMA;  Surgeon: TJoyice Faster Cornett, Jackson;  Location[place] [facility]  Service: General;  Laterality: Left;  excision lipoma Left Lower abdomen   TOTAL KNEE ARTHROPLASTY Left 05/08/2013   TOTAL KNEE ARTHROPLASTY Left 05/08/2013   Procedure: TOTAL KNEE ARTHROPLASTY;  Surgeon: DNinetta Lights Jackson;  [place] [facility]  Service: Orthopedics;  Laterality: Left;   TOTAL KNEE ARTHROPLASTY Right 09/13/2021   Procedure: TOTAL KNEE ARTHROPLASTY;  Surgeon: AGaynelle Arabian Jackson;  Location: [facility];  Service: Orthopedics;  Laterality: Right;   TUBAL LIGATION Left ~ [place] Left ~ 1984   "under breast" (05/09/2013)    Social History: Social History   Socioeconomic History   Marital status: Divorced    Spouse name: Not on file   Number of children: 2   Years of education: Not on file   Highest education level: Not on file  Occupational History   Occupation: cleaner    Comment: cleans apts and homes  Tobacco Use   Smoking status: Former    Packs/day: 2.00    Years: 35.00    Total pack years: 70.00    Types: Cigarettes    Quit date: 08/20/2013    Years since quitting: 9.0   Smokeless tobacco: Never   Tobacco comments:    Currently exposed to 2nd hand smoke in home   Vaping Use   Vaping Use: Never used  Substance and Sexual Activity   Alcohol use: No   Drug use: No   Sexual activity: Not Currently    Birth control/protection: Post-menopausal  Other Topics Concern   Not on file  Social History Narrative   Lives with her daughter's father.   Social Determinants of Health   Financial Resource Strain: Not on  file  Food Insecurity: Not on file  Transportation Needs: Not on file  Physical Activity: Not on file  Stress: Not on file  Social Connections: Not on file  Intimate Partner Violence: Not on file    Family History: Family History  Problem Relation Age of Onset   Colon cancer Father    Cancer Father        colon   Heart disease Father    Heart disease Paternal Uncle    Diabetes Sister    Malignant hyperthermia Neg Hx     Review of Systems: ROS Negative for fever Physical Exam: Vital Signs BP 126/73 (BP Location: Left Arm, Patient Position: Sitting, Cuff Size: Large)   Pulse 75   Ht _0  (1.549 m)   Wt 163 lb (73.9 kg)   SpO2 98%   BMI 30.80 kg/m   Physical Exam  Constitutional:      General: Not in acute distress.    Appearance: Normal appearance. Not ill-appearing.  HENT:     Head: Normocephalic and atraumatic.  Eyes:     Pupils: Pupils are equal, round. Cardiovascular:     Rate and Rhythm: Normal rate. Pulmonary:     Effort: No respiratory distress or increased work of breathing.  Speaks in full sentences. Musculoskeletal: Normal range of motion. No lower extremity swelling or edema.  Varicose veins Skin:    General: Skin is warm and dry.     Findings: 4 cm x 2.5 cm wound to the right lower extremity with some surrounding swelling and mild erythema, no purulence.  Some fibrinous exudate noted within the wound and some general edema in the right lower extremity Neurological:     Mental Status: Alert and oriented to person, place, and time.  Psychiatric:        Mood and Affect: Mood normal.        Behavior: Behavior normal.    Assessment/Plan: The patient is scheduled for wound excision with placement of myriad with Dr. Marla Roe.  Risks, benefits, and alternatives of procedure discussed, questions answered and consent obtained.    Smoking Status: Non-smoker   Caprini Score: 6; Risk Factors include: Age, BMI, varicose veins, leg edema; Recommendation  is  early ambulation  Pictures obtained: Today  Post-op Rx sent to pharmacy: No prescription sent  Patient was provided with the  General Surgical Risk consent document and Pain Medication Agreement prior to their appointment.  They had adequate time to read through the risk consent documents and Pain Medication Agreement. We also discussed them in person together during this preop appointment. All of their questions were answered to their satisfaction.  Recommended calling if they have any further questions.  Risk consent form and Pain Medication Agreement to be scanned into patient's chart.     Electronically signed by: Stevie Kern Daxx Tiggs, PA-C 08/31/2022 5:00 PM

## 2022-09-01 ENCOUNTER — Telehealth: Payer: Self-pay

## 2022-09-01 ENCOUNTER — Encounter: Payer: Medicare Other | Admitting: Student

## 2022-09-01 NOTE — Anesthesia Preprocedure Evaluation (Signed)
Anesthesia Evaluation  Patient identified by MRN, date of birth, ID band Patient awake    Reviewed: Allergy & Precautions, NPO status , Patient's Chart, lab work & pertinent test results  History of Anesthesia Complications Negative for: history of anesthetic complications  Airway Mallampati: I  TM Distance: >3 FB Neck ROM: Full    Dental  (+) Edentulous Upper, Edentulous Lower   Pulmonary COPD,  COPD inhaler, former smoker,    breath sounds clear to auscultation       Cardiovascular hypertension, Pt. on medications (-) angina Rhythm:Regular Rate:Normal  '20 ECHO: 60-65%. The LV has normal function, no left ventricular hypertrophy. Elevated left atrial pressure.  Grade I DD, no significant valvular abnormalities   Neuro/Psych  Headaches,    GI/Hepatic Neg liver ROS, GERD  Controlled and Medicated,  Endo/Other  BMI 30  Renal/GU Renal InsufficiencyRenal disease     Musculoskeletal  (+) Arthritis ,   Abdominal (+) + obese,   Peds  Hematology negative hematology ROS (+)   Anesthesia Other Findings   Reproductive/Obstetrics                            Anesthesia Physical Anesthesia Plan  ASA: 3  Anesthesia Plan: General   Post-op Pain Management: Tylenol PO (pre-op)*   Induction: Intravenous  PONV Risk Score and Plan: 3 and Ondansetron, Dexamethasone and Treatment may vary due to age or medical condition  Airway Management Planned: LMA  Additional Equipment: None  Intra-op Plan:   Post-operative Plan:   Informed Consent: I have reviewed the patients History and Physical, chart, labs and discussed the procedure including the risks, benefits and alternatives for the proposed anesthesia with the patient or authorized representative who has indicated his/her understanding and acceptance.       Plan Discussed with: CRNA and Surgeon  Anesthesia Plan Comments:        Anesthesia  Quick Evaluation

## 2022-09-01 NOTE — Telephone Encounter (Signed)
Received copy of xray from [facility].

## 2022-09-02 ENCOUNTER — Encounter (HOSPITAL_BASED_OUTPATIENT_CLINIC_OR_DEPARTMENT_OTHER)
Admission: RE | Disposition: A | Discharge status: Home / Self Care | Payer: Self-pay | Source: Ambulatory Visit | Attending: Plastic Surgery

## 2022-09-02 ENCOUNTER — Telehealth: Payer: Self-pay

## 2022-09-02 ENCOUNTER — Ambulatory Visit (HOSPITAL_BASED_OUTPATIENT_CLINIC_OR_DEPARTMENT_OTHER): Payer: Medicare Other | Admitting: Anesthesiology

## 2022-09-02 ENCOUNTER — Other Ambulatory Visit: Payer: Self-pay

## 2022-09-02 ENCOUNTER — Encounter (HOSPITAL_BASED_OUTPATIENT_CLINIC_OR_DEPARTMENT_OTHER): Payer: Self-pay | Admitting: Plastic Surgery

## 2022-09-02 ENCOUNTER — Ambulatory Visit (HOSPITAL_BASED_OUTPATIENT_CLINIC_OR_DEPARTMENT_OTHER)
Admission: RE | Admit: 2022-09-02 | Discharge: 2022-09-02 | Disposition: A | Discharge status: Home / Self Care | Payer: Medicare Other | Source: Ambulatory Visit | Attending: Plastic Surgery | Admitting: Plastic Surgery

## 2022-09-02 DIAGNOSIS — X58XXXA Exposure to other specified factors, initial encounter: Secondary | ICD-10-CM | POA: Insufficient documentation

## 2022-09-02 DIAGNOSIS — Z87891 Personal history of nicotine dependence: Secondary | ICD-10-CM | POA: Diagnosis not present

## 2022-09-02 DIAGNOSIS — S81801A Unspecified open wound, right lower leg, initial encounter: Secondary | ICD-10-CM | POA: Insufficient documentation

## 2022-09-02 DIAGNOSIS — I10 Essential (primary) hypertension: Secondary | ICD-10-CM

## 2022-09-02 DIAGNOSIS — K219 Gastro-esophageal reflux disease without esophagitis: Secondary | ICD-10-CM | POA: Insufficient documentation

## 2022-09-02 DIAGNOSIS — J449 Chronic obstructive pulmonary disease, unspecified: Secondary | ICD-10-CM

## 2022-09-02 DIAGNOSIS — Z01818 Encounter for other preprocedural examination: Secondary | ICD-10-CM

## 2022-09-02 DIAGNOSIS — E669 Obesity, unspecified: Secondary | ICD-10-CM | POA: Insufficient documentation

## 2022-09-02 DIAGNOSIS — Z6830 Body mass index (BMI) 30.0-30.9, adult: Secondary | ICD-10-CM | POA: Diagnosis not present

## 2022-09-02 HISTORY — PX: INCISION AND DRAINAGE OF WOUND: SHX1803

## 2022-09-02 SURGERY — IRRIGATION AND DEBRIDEMENT WOUND
Anesthesia: General | Site: Leg Lower | Laterality: Right

## 2022-09-02 MED ORDER — MEPERIDINE HCL 25 MG/ML IJ SOLN: 6.2500 mg | INTRAMUSCULAR | Status: DC | PRN

## 2022-09-02 MED ORDER — OXYCODONE HCL 5 MG PO TABS: 5.0000 mg | ORAL_TABLET | ORAL | Status: DC | PRN

## 2022-09-02 MED ORDER — 0.9 % SODIUM CHLORIDE (POUR BTL) OPTIME
TOPICAL | Status: DC | PRN
  Administered 2022-09-02: 100 mL

## 2022-09-02 MED ORDER — ACETAMINOPHEN 500 MG PO TABS
1000.0000 mg | ORAL_TABLET | Freq: Once | ORAL | Status: AC
  Administered 2022-09-02: 1000 mg via ORAL

## 2022-09-02 MED ORDER — LIDOCAINE 2% (20 MG/ML) 5 ML SYRINGE
INTRAMUSCULAR | Status: AC
  Filled 2022-09-02: qty 5

## 2022-09-02 MED ORDER — DEXAMETHASONE SODIUM PHOSPHATE 10 MG/ML IJ SOLN
INTRAMUSCULAR | Status: AC
  Filled 2022-09-02: qty 1

## 2022-09-02 MED ORDER — OXYCODONE HCL 5 MG PO TABS
ORAL_TABLET | ORAL | Status: AC
  Filled 2022-09-02: qty 1

## 2022-09-02 MED ORDER — ONDANSETRON HCL 4 MG/2ML IJ SOLN
INTRAMUSCULAR | Status: DC | PRN
  Administered 2022-09-02: 4 mg via INTRAVENOUS

## 2022-09-02 MED ORDER — PROPOFOL 10 MG/ML IV BOLUS
INTRAVENOUS | Status: AC
  Filled 2022-09-02: qty 20

## 2022-09-02 MED ORDER — CHLORHEXIDINE GLUCONATE CLOTH 2 % EX PADS: 6.0000 | MEDICATED_PAD | Freq: Once | CUTANEOUS | Status: DC

## 2022-09-02 MED ORDER — HEPARIN SOD (PORK) LOCK FLUSH 100 UNIT/ML IV SOLN
INTRAVENOUS | Status: AC
  Filled 2022-09-02: qty 5

## 2022-09-02 MED ORDER — OXYCODONE HCL 5 MG/5ML PO SOLN: 5.0000 mg | Freq: Once | ORAL | Status: AC | PRN

## 2022-09-02 MED ORDER — LIDOCAINE HCL (CARDIAC) PF 100 MG/5ML IV SOSY
PREFILLED_SYRINGE | INTRAVENOUS | Status: DC | PRN
  Administered 2022-09-02: 50 mg via INTRAVENOUS

## 2022-09-02 MED ORDER — SODIUM CHLORIDE 0.9% FLUSH: 3.0000 mL | INTRAVENOUS | Status: DC | PRN

## 2022-09-02 MED ORDER — ACETAMINOPHEN 500 MG PO TABS
ORAL_TABLET | ORAL | Status: AC
  Filled 2022-09-02: qty 2

## 2022-09-02 MED ORDER — FENTANYL CITRATE (PF) 100 MCG/2ML IJ SOLN
INTRAMUSCULAR | Status: DC | PRN
  Administered 2022-09-02: 50 ug via INTRAVENOUS

## 2022-09-02 MED ORDER — OXYCODONE HCL 5 MG PO TABS
5.0000 mg | ORAL_TABLET | Freq: Once | ORAL | Status: AC | PRN
  Administered 2022-09-02: 5 mg via ORAL

## 2022-09-02 MED ORDER — PROPOFOL 10 MG/ML IV BOLUS
INTRAVENOUS | Status: DC | PRN
  Administered 2022-09-02: 120 mg via INTRAVENOUS

## 2022-09-02 MED ORDER — MIDAZOLAM HCL 2 MG/2ML IJ SOLN
INTRAMUSCULAR | Status: AC
  Filled 2022-09-02: qty 2

## 2022-09-02 MED ORDER — FENTANYL CITRATE (PF) 100 MCG/2ML IJ SOLN
25.0000 ug | INTRAMUSCULAR | Status: DC | PRN
  Administered 2022-09-02 (×2): 25 ug via INTRAVENOUS

## 2022-09-02 MED ORDER — LIDOCAINE-EPINEPHRINE 1 %-1:100000 IJ SOLN
INTRAMUSCULAR | Status: AC
  Filled 2022-09-02: qty 1

## 2022-09-02 MED ORDER — MIDAZOLAM HCL 2 MG/2ML IJ SOLN: 0.5000 mg | Freq: Once | INTRAMUSCULAR | Status: DC | PRN

## 2022-09-02 MED ORDER — FENTANYL CITRATE (PF) 100 MCG/2ML IJ SOLN
INTRAMUSCULAR | Status: AC
  Filled 2022-09-02: qty 2

## 2022-09-02 MED ORDER — BUPIVACAINE HCL (PF) 0.25 % IJ SOLN
INTRAMUSCULAR | Status: AC
  Filled 2022-09-02: qty 30

## 2022-09-02 MED ORDER — LACTATED RINGERS IV SOLN: INTRAVENOUS | Status: DC

## 2022-09-02 MED ORDER — PROMETHAZINE HCL 25 MG/ML IJ SOLN: 6.2500 mg | INTRAMUSCULAR | Status: DC | PRN

## 2022-09-02 MED ORDER — HEPARIN (PORCINE) IN NACL 1000-0.9 UT/500ML-% IV SOLN
INTRAVENOUS | Status: AC
  Filled 2022-09-02: qty 500

## 2022-09-02 MED ORDER — CIPROFLOXACIN IN D5W 400 MG/200ML IV SOLN
400.0000 mg | INTRAVENOUS | Status: AC
  Administered 2022-09-02: 400 mg via INTRAVENOUS

## 2022-09-02 MED ORDER — CIPROFLOXACIN IN D5W 400 MG/200ML IV SOLN
INTRAVENOUS | Status: AC
  Filled 2022-09-02: qty 200

## 2022-09-02 MED ORDER — BUPIVACAINE-EPINEPHRINE (PF) 0.25% -1:200000 IJ SOLN
INTRAMUSCULAR | Status: AC
  Filled 2022-09-02: qty 150

## 2022-09-02 MED ORDER — FENTANYL CITRATE (PF) 100 MCG/2ML IJ SOLN: 25.0000 ug | INTRAMUSCULAR | Status: DC | PRN

## 2022-09-02 MED ORDER — ONDANSETRON HCL 4 MG/2ML IJ SOLN
INTRAMUSCULAR | Status: AC
  Filled 2022-09-02: qty 2

## 2022-09-02 MED ORDER — SODIUM CHLORIDE 0.9% FLUSH: 3.0000 mL | Freq: Two times a day (BID) | INTRAVENOUS | Status: DC

## 2022-09-02 MED ORDER — SODIUM CHLORIDE 0.9 % IV SOLN: 250.0000 mL | INTRAVENOUS | Status: DC | PRN

## 2022-09-02 MED ORDER — DEXAMETHASONE SODIUM PHOSPHATE 4 MG/ML IJ SOLN
INTRAMUSCULAR | Status: DC | PRN
  Administered 2022-09-02: 5 mg via INTRAVENOUS

## 2022-09-02 SURGICAL SUPPLY — 69 items
ADH SKN CLS APL DERMABOND .7 (GAUZE/BANDAGES/DRESSINGS)
APL SKNCLS STERI-STRIP NONHPOA (GAUZE/BANDAGES/DRESSINGS)
BAG DECANTER FOR FLEXI CONT (MISCELLANEOUS) IMPLANT
BENZOIN TINCTURE PRP APPL 2/3 (GAUZE/BANDAGES/DRESSINGS) IMPLANT
BLADE HEX COATED 2.75 (ELECTRODE) IMPLANT
BLADE SURG 10 STRL SS (BLADE) IMPLANT
BLADE SURG 15 STRL LF DISP TIS (BLADE) ×1 IMPLANT
BLADE SURG 15 STRL SS (BLADE) ×1
BNDG ELASTIC 4X5.8 VLCR STR LF (GAUZE/BANDAGES/DRESSINGS) IMPLANT
BNDG GAUZE DERMACEA FLUFF 4 (GAUZE/BANDAGES/DRESSINGS) IMPLANT
BNDG GZE DERMACEA 4 6PLY (GAUZE/BANDAGES/DRESSINGS) ×1
CANISTER SUCT 1200ML W/VALVE (MISCELLANEOUS) IMPLANT
COVER BACK TABLE 60X90IN (DRAPES) ×1 IMPLANT
COVER MAYO STAND STRL (DRAPES) ×1 IMPLANT
DERMABOND ADVANCED .7 DNX12 (GAUZE/BANDAGES/DRESSINGS) IMPLANT
DRAIN CHANNEL 19F RND (DRAIN) IMPLANT
DRAIN PENROSE .5X12 LATEX STL (DRAIN) IMPLANT
DRAPE INCISE IOBAN 66X45 STRL (DRAPES) IMPLANT
DRAPE LAPAROSCOPIC ABDOMINAL (DRAPES) IMPLANT
DRAPE LAPAROTOMY 100X72 PEDS (DRAPES) IMPLANT
DRSG ADAPTIC 3X8 NADH LF (GAUZE/BANDAGES/DRESSINGS) IMPLANT
DRSG EMULSION OIL 3X3 NADH (GAUZE/BANDAGES/DRESSINGS) IMPLANT
ELECT REM PT RETURN 9FT ADLT (ELECTROSURGICAL) ×1
ELECTRODE REM PT RTRN 9FT ADLT (ELECTROSURGICAL) ×1 IMPLANT
EVACUATOR SILICONE 100CC (DRAIN) IMPLANT
GAUZE PAD ABD 8X10 STRL (GAUZE/BANDAGES/DRESSINGS) IMPLANT
GAUZE SPONGE 4X4 12PLY STRL (GAUZE/BANDAGES/DRESSINGS) ×1 IMPLANT
GAUZE SPONGE 4X4 12PLY STRL LF (GAUZE/BANDAGES/DRESSINGS) IMPLANT
GAUZE XEROFORM 1X8 LF (GAUZE/BANDAGES/DRESSINGS) IMPLANT
GAUZE XEROFORM 5X9 LF (GAUZE/BANDAGES/DRESSINGS) IMPLANT
GLOVE BIO SURGEON STRL SZ 6.5 (GLOVE) ×2 IMPLANT
GOWN STRL REUS W/ TWL LRG LVL3 (GOWN DISPOSABLE) ×2 IMPLANT
GOWN STRL REUS W/TWL LRG LVL3 (GOWN DISPOSABLE) ×3
GRAFT MYRIAD 3 LAYER 5X5 (Graft) IMPLANT
IV NS IRRIG 3000ML ARTHROMATIC (IV SOLUTION) IMPLANT
MANIFOLD NEPTUNE II (INSTRUMENTS) IMPLANT
NDL HYPO 25X1 1.5 SAFETY (NEEDLE) IMPLANT
NEEDLE HYPO 25X1 1.5 SAFETY (NEEDLE) IMPLANT
NS IRRIG 1000ML POUR BTL (IV SOLUTION) ×1 IMPLANT
PACK BASIN DAY SURGERY FS (CUSTOM PROCEDURE TRAY) ×1 IMPLANT
PENCIL SMOKE EVACUATOR (MISCELLANEOUS) ×1 IMPLANT
PIN SAFETY STERILE (MISCELLANEOUS) IMPLANT
POWDER MYRIAD MORCELLS 500MG (Miscellaneous) IMPLANT
SHEET MEDIUM DRAPE 40X70 STRL (DRAPES) IMPLANT
SLEEVE SCD COMPRESS KNEE MED (STOCKING) IMPLANT
SPIKE FLUID TRANSFER (MISCELLANEOUS) IMPLANT
SPONGE T-LAP 18X18 ~~LOC~~+RFID (SPONGE) IMPLANT
STAPLER VISISTAT 35W (STAPLE) IMPLANT
STRIP CLOSURE SKIN 1/2X4 (GAUZE/BANDAGES/DRESSINGS) IMPLANT
SUCTION FRAZIER HANDLE 10FR (MISCELLANEOUS)
SUCTION TUBE FRAZIER 10FR DISP (MISCELLANEOUS) IMPLANT
SURGILUBE 2OZ TUBE FLIPTOP (MISCELLANEOUS) IMPLANT
SUT MNCRL AB 4-0 PS2 18 (SUTURE) ×1 IMPLANT
SUT MON AB 3-0 SH 27 (SUTURE)
SUT MON AB 3-0 SH27 (SUTURE) IMPLANT
SUT SILK 3 0 PS 1 (SUTURE) IMPLANT
SUT VIC AB 3-0 FS2 27 (SUTURE) IMPLANT
SUT VIC AB 5-0 PS2 18 (SUTURE) IMPLANT
SUT VICRYL 4-0 PS2 18IN ABS (SUTURE) IMPLANT
SWAB COLLECTION DEVICE MRSA (MISCELLANEOUS) IMPLANT
SWAB CULTURE ESWAB REG 1ML (MISCELLANEOUS) IMPLANT
SYR BULB IRRIG 60ML STRL (SYRINGE) IMPLANT
SYR CONTROL 10ML LL (SYRINGE) IMPLANT
TAPE HYPAFIX 6X30 (GAUZE/BANDAGES/DRESSINGS) IMPLANT
TOWEL GREEN STERILE FF (TOWEL DISPOSABLE) ×1 IMPLANT
TRAY DSU PREP LF (CUSTOM PROCEDURE TRAY) IMPLANT
TUBE CONNECTING 20X1/4 (TUBING) IMPLANT
UNDERPAD 30X36 HEAVY ABSORB (UNDERPADS AND DIAPERS) IMPLANT
YANKAUER SUCT BULB TIP NO VENT (SUCTIONS) IMPLANT

## 2022-09-02 NOTE — Anesthesia Postprocedure Evaluation (Addendum)
Anesthesia Post Note  Patient: Summer Jackson  Procedure(s) Performed: right leg wound excision with myriad placement (Right: Leg Lower)     Patient location during evaluation: Phase II Anesthesia Type: General Level of consciousness: awake and alert, patient cooperative and oriented Pain management: pain level controlled Vital Signs Assessment: post-procedure vital signs reviewed and stable Respiratory status: spontaneous breathing, nonlabored ventilation and respiratory function stable Cardiovascular status: blood pressure returned to baseline and stable Postop Assessment: no apparent nausea or vomiting and adequate PO intake Anesthetic complications: no   No notable events documented.  Last Vitals:  Vitals:   09/02/22 0839 09/02/22 0850  BP: 115/64 116/65  Pulse: 72 74  Resp: 15 20  Temp:  36.6 C  SpO2: 99% 99%    Last Pain:  Vitals:   09/02/22 0850  TempSrc:   PainSc: 10-Worst pain ever                 Ondine Gemme,E. Xaniyah Buchholz

## 2022-09-02 NOTE — Discharge Instructions (Addendum)
Wound Care   Guide to Wound Care  Proper wound care may reduce the risk of infection, improve healing rates, and limit scarring.  This is a general guide to help care for and manage wounds treated with product wound matrix.   Dressing Changes The frequency of dressing changes can vary based on which product was applied, the size of the wound, or the amount of wound drainage. Dressing inspections are recommended, at least weekly.   Place KY gel on the wound daily and cover with gauze.  Dressing Types Primary Dressing:  Non-adherent dressing goes directly over wounds being treated with the powder or sheet.  Secondary Dressing:  Secures the primary dressing in place and provides extra protection, compression, and absorption.  1. Wash Hands - To help decrease the risk of infection, caregivers should wash their hands for a minimum of 20 seconds and may use medical gloves.   2. Remove the Dressings - Avoid removing product from the wound by carefully removing the applicable dressing(s) at the time points recommended above, or as recommended by the treating physician.  Expected Color and Odor:  It is entirely normal for the wound to have an unpleasant odor and to form a caramel-colored gel as the product absorbs into the wound. It is important to leave this gel on the wound site.  3. Clean the Wound - Use clean water or saline to gently rinse around the wound surface and remove any excess discharge that may be present on the wound. Do not wipe off any of the caramel-colored gel on the wound.   What to look out for:  Large or increased amount of drainage   Surrounding skin has worsening redness or hot to touch   Increased pain in or around the wound   Flu-like symptoms, fatigue, decreased appetite, fever   Hard, crusty wound surface with black or brown coloring  4. Apply New Dressings - Dressings should cover the entire wound and be suitable for maintaining a moist wound environment.  The  non-adherent mesh dressing should be left in place.  New dressing should consist of KY Jelly to keep the wound moist and soft gauze secured with a wrap or tape.   Maintain a Hydrated Wound Area It is important to keep the wound area moist throughout the healing process. If the wound appears to be dry during dressing changes, select a dressing that will hydrate the wound and maintain that ideal moist environment. If you are unsure what to do, ask the treating physician.  Remodeling Process Every patient heals differently, and no two cases are the same. The size and location of the wound, product type and layering configurations, and general patient health all contribute to how quickly a wound will heal.  While many factors can influence the rate at which the product absorbs, the following can be used as a general guide.   THINGS TO DO: Refrain from smoking High protein diet with plenty of vegetables and some fruit  Limit simple processed carbohydrates and sugar Protect the wound from trauma Protect the dressing  powder       Sheet            Sorbact dressing\    Post Anesthesia Home Care Instructions  Activity: Get plenty of rest for the remainder of the day. A responsible individual must stay with you for 24 hours following the procedure.  For the next 24 hours, DO NOT: -Drive a car -Paediatric nurse -Drink alcoholic beverages -Take any medication  unless instructed by your physician -Make any legal decisions or sign important papers.  Meals: Start with liquid foods such as gelatin or soup. Progress to regular foods as tolerated. Avoid greasy, spicy, heavy foods. If nausea and/or vomiting occur, drink only clear liquids until the nausea and/or vomiting subsides. Call your physician if vomiting continues.  Special Instructions/Symptoms: Your throat may feel dry or sore from the anesthesia or the breathing tube placed in your throat during surgery. If this causes discomfort, gargle  with warm salt water. The discomfort should disappear within 24 hours.  If you had a scopolamine patch placed behind your ear for the management of post- operative nausea and/or vomiting:  1. The medication in the patch is effective for 72 hours, after which it should be removed.  Wrap patch in a tissue and discard in the trash. Wash hands thoroughly with soap and water. 2. You may remove the patch earlier than 72 hours if you experience unpleasant side effects which may include dry mouth, dizziness or visual disturbances. 3. Avoid touching the patch. Wash your hands with soap and water after contact with the patch.      Next dose of Tylenol may be taken at 1:00 PM

## 2022-09-02 NOTE — Transfer of Care (Signed)
Immediate Anesthesia Transfer of Care Note  Patient: Summer Jackson  Procedure(s) Performed: right leg wound excision with myriad placement (Right: Leg Lower)  Patient Location: PACU  Anesthesia Type:General  Level of Consciousness: awake, alert  and oriented  Airway & Oxygen Therapy: Patient Spontanous Breathing and Patient connected to face mask oxygen  Post-op Assessment: Report given to RN and Post -op Vital signs reviewed and stable  Post vital signs: Reviewed and stable  Last Vitals:  Vitals Value Taken Time  BP    Temp 36.2 C 09/02/22 0806  Pulse    Resp 13 09/02/22 0811  SpO2    Vitals shown include unvalidated device data.  Last Pain:  Vitals:   09/02/22 0643  TempSrc: Oral  PainSc: 0-No pain      Patients Stated Pain Goal: 8 (60/27/82 9603)  Complications: No notable events documented.

## 2022-09-02 NOTE — Telephone Encounter (Signed)
Patient had surgery this morning and said she had a couple of questions. Wants to know when she can change the bandage on her leg, when she can start taking her asprin and if we can send something in for pain management.

## 2022-09-02 NOTE — Op Note (Signed)
DATE OF OPERATION: 09/02/2022  LOCATION: [facility]  PREOPERATIVE DIAGNOSIS: right leg wound  POSTOPERATIVE DIAGNOSIS: Same  PROCEDURE: Excision of right leg wound skin and soft tissue 2.5 x 3 cm with placement of Myriad 500 mg powder and 5 x 5 cm sheet  SURGEON: Kylena Mole Sanger Azeem Poorman, DO  ASSISTANT: Lenn Sink, PA  EBL: none  CONDITION: Stable  COMPLICATIONS: None  INDICATION: The patient, Summer Jackson, is a 76 y.o. female born on 03-Jun-1946, is here for treatment of a right leg wound that has been very slow to heal.   PROCEDURE DETAILS:  The patient was seen prior to surgery and marked.  The IV antibiotics were given. The patient was taken to the operating room and given a general anesthetic. A standard time out was performed and all information was confirmed by those in the room. SCD was placed on the left leg.  The right leg was prepped and draped.  The 2.5 x 3 cm wound was irrigated.  The #15 blade and curette was used to excise the skin and soft tissue with fibrous tissue. Hemostasis was achieved with pressure.  All of the myriad powder and sheet were applied.  The sheet was secured with the 5-0 Vicryl.  The sorbact was placed and secured with the Vicryl.  KY gel, gauze and kerlex were placed.  The patient was allowed to wake up and taken to recovery room in stable condition at the end of the case. The family was notified at the end of the case.   The advanced practice practitioner (APP) assisted throughout the case.  The APP was essential in retraction and counter traction when needed to make the case progress smoothly.  This retraction and assistance made it possible to see the tissue plans for the procedure.  The assistance was needed for blood control, tissue re-approximation and assisted with closure of the incision site.

## 2022-09-02 NOTE — Interval H&P Note (Signed)
History and Physical Interval Note:  09/02/2022 7:12 AM  Summer Jackson  has presented today for surgery, with the diagnosis of Wound of right lower extremity.  The various methods of treatment have been discussed with the patient and family. After consideration of risks, benefits and other options for treatment, the patient has consented to  Procedure(s): right leg wound excision with myriad placement (Right) as a surgical intervention.  The patient's history has been reviewed, patient examined, no change in status, stable for surgery.  I have reviewed the patient's chart and labs.  Questions were answered to the patient's satisfaction.     Loel Lofty Kryssa Risenhoover

## 2022-09-02 NOTE — Anesthesia Procedure Notes (Signed)
Procedure Name: LMA Insertion Date/Time: 09/02/2022 7:42 AM  Performed by: Bufford Spikes, CRNAPre-anesthesia Checklist: Patient identified, Emergency Drugs available, Suction available and Patient being monitored Patient Re-evaluated:Patient Re-evaluated prior to induction Oxygen Delivery Method: Circle system utilized Preoxygenation: Pre-oxygenation with 100% oxygen Induction Type: IV induction Ventilation: Mask ventilation without difficulty LMA: LMA inserted LMA Size: 4.0 Number of attempts: 1 Placement Confirmation: positive ETCO2 Tube secured with: Tape Dental Injury: Teeth and Oropharynx as per pre-operative assessment

## 2022-09-05 NOTE — Telephone Encounter (Signed)
I spoke with her and went over wound care and dressing. She will call with any questions, otherwise she will follow up in clinic as previously scheduled

## 2022-09-05 NOTE — Telephone Encounter (Signed)
Attempted to call, will try again

## 2022-09-05 NOTE — Progress Notes (Signed)
Left message stating courtesy call and if any questions or concerns please call the doctors office.  

## 2022-09-08 NOTE — Progress Notes (Signed)
Ok thanks 

## 2022-09-08 NOTE — Progress Notes (Deleted)
Referring Provider Summer Bleacher, NP [name],  [place] [id number]   CC: No chief complaint on file.     Summer Jackson is an 76 y.o. female.  HPI: Patient is a 76 year old female with PMH of right lower extremity wound s/p debridement with placement of myriad performed 09/02/2022 by Dr. Marla Roe who presents to clinic for postoperative follow-up.  Today,  Allergies  Allergen Reactions   Sulfa Antibiotics Swelling and Rash   Xarelto [Rivaroxaban] Hives and Itching   Amoxicillin Other (See Comments)    REACTION: headache   Azithromycin Other (See Comments)    REACTION: reddness around mouth   Clindamycin/Lincomycin Hives   Erythromycin Hives   Penicillins Other (See Comments)    REACTION: headache Has patient had a PCN reaction causing immediate rash, facial/tongue/throat swelling, SOB or lightheadedness with hypotension: No Has patient had a PCN reaction causing severe rash involving mucus membranes or skin necrosis: No Has patient had a PCN reaction that required hospitalization No Has patient had a PCN reaction occurring within the last 10 years: No If all of the above answers are NO, then may proceed with Cephalosporin use.  Tolerated Cephalosporin Date: 09/14/21.   Propoxyphene N-Acetaminophen Other (See Comments)    REACTION: mouth sores   Clarithromycin Rash    REACTION: skin rash with minimal vesicular formation on arms and hands    Outpatient Encounter Medications as of 09/09/2022  Medication Sig   albuterol (PROVENTIL) (2.5 MG/3ML) 0.083% nebulizer solution Take 3 mLs (2.5 mg total) by nebulization every 6 (six) hours as needed for wheezing or shortness of breath.   albuterol (VENTOLIN HFA) 108 (90 Base) MCG/ACT inhaler Can inhale two puffs every four to six hours as needed for cough or wheeze. (Patient taking differently: Inhale 2 puffs into the lungs every 4 (four) hours as needed for wheezing (cough).)   aspirin EC 81 MG tablet Take 81 mg by mouth  every morning. Swallow whole.   bacitracin ointment Apply topically daily. bacitracin, Adaptic dressing daily   baclofen (LIORESAL) 10 MG tablet Take 10 mg by mouth 3 (three) times daily as needed for muscle spasms.   BREO ELLIPTA 200-25 MCG/ACT AEPB Inhale 1 puff into the lungs daily as needed (sob & wheexzing).   ciclopirox (PENLAC) 8 % solution Apply topically at bedtime. Apply over nail and surrounding skin. Apply daily over previous coat. After seven (7) days, may remove with alcohol and continue cycle.   fluticasone (FLONASE) 50 MCG/ACT nasal spray Place 1-2 sprays into both nostrils daily as needed for allergies.   furosemide (LASIX) 20 MG tablet Take 1 tablet (20 mg total) by mouth daily as needed.   lisinopril (ZESTRIL) 20 MG tablet Take 1 tablet by mouth once daily   pantoprazole (PROTONIX) 40 MG tablet Take 40 mg by mouth daily.   No facility-administered encounter medications on file as of 09/09/2022.     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   Qualifier: Diagnosis of  By: Radene Ou MD, [place] DISEASE, 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   Annotation: 1957 Qualifier: History of  By: Radene Ou MD, [place]     Constipation 05/29/2013   COPD (chronic obstructive pulmonary disease) (HCC)    Exertional shortness of breath  Foreign body sensation in throat 12/16/2016   Gastroesophageal reflux disease with esophagitis 12/16/2016   GERD (gastroesophageal reflux disease)    Headache 03/20/2018   migraine Overview:  Overview:  migraine migraine   Headache(784.0)    migraine    Hemorrhoids    History of stomach ulcers 1990's   HYPERLIPIDEMIA 08/25/2007   Qualifier: Diagnosis of  By: Radene Ou MD, [place]     HYPERTENSION 08/09/2007   Qualifier: Diagnosis of  By: Radene Ou MD, [place]      Idiopathic peripheral autonomic neuropathy 03/27/2019   Left lower quadrant pain 09/05/2018   Left shoulder pain 02/05/2013   Lipoma of abdominal wall 01/23/2012   Nicotine dependence 11/23/2018   Pharyngeal dysphagia 12/16/2016   Prediabetes 01/10/2013   Rectal bleeding    Scapular dyskinesis 03/26/2018   SHINGLES, RECURRENT 09/30/2008   Qualifier: Diagnosis of  By: Radene Ou MD, [place]     Spondylolisthesis, lumbar region 04/10/2019   Added automatically from request for surgery [id number]   Sternal pain 03/26/2018   Synovial cyst of lumbar facet joint 04/08/2019   Urinary frequency    Weight loss    Wheezing     Past Surgical History:  Procedure Laterality Date   APPENDECTOMY  1970's   BACK SURGERY  04/2019   COLONOSCOPY     COLONOSCOPY  12/22/2011   Procedure: COLONOSCOPY;  Surgeon: Lear Ng, MD;  Location: [facility];  Service: Endoscopy;  Laterality: N/A;   heart surgery at 61yr 1955   "blood was going thru my lungs but not thru my heart like it should" (05/09/2013)   INCISION AND DRAINAGE OF WOUND Right 09/02/2022   Procedure: right leg wound excision with myriad placement;  Surgeon: DWallace Going DO;  [facility]  Service: Plastics;  Laterality: Right;   KNEE ARTHROSCOPY Left ~ 2004; ~ 2006   LEFT OOPHORECTOMY Left ~ [place] 02/15/2012   Procedure: EXCISION LIPOMA;  Surgeon: TJoyice Faster Cornett, MD;  Location: [facility]  Service: General;  Laterality: Left;  excision lipoma Left Lower abdomen   TOTAL KNEE ARTHROPLASTY Left 05/08/2013   TOTAL KNEE ARTHROPLASTY Left 05/08/2013   Procedure: TOTAL KNEE ARTHROPLASTY;  Surgeon: DNinetta Lights MD;  [facility]  Service: Orthopedics;  Laterality: Left;   TOTAL KNEE ARTHROPLASTY Right 09/13/2021   Procedure: TOTAL KNEE ARTHROPLASTY;  Surgeon: AGaynelle Arabian MD;  Location: [facility];  Service: Orthopedics;  Laterality: Right;   [place]   "under breast" (05/09/2013)    Family History  Problem Relation Age of Onset   Colon cancer Father    Cancer Father        colon   Heart disease Father    Heart disease Paternal Uncle    Diabetes Sister    Malignant hyperthermia Neg Hx     Social History   Social History Narrative   Lives with her daughter's father.     Review of Systems General: Denies fevers or chills Cardio: Denies chest pain Pulmonary: Denies difficulty breathing  Physical Exam    09/02/2022    8:50 AM 09/02/2022    8:39 AM 09/02/2022    8:35 AM  Vitals with BMI  Systolic [id number]  Diastolic 65 64   Pulse 74 72 70    General:  No acute distress, nontoxic appearing  Respiratory: No increased work of breathing Neuro: Alert and oriented  Psychiatric: Normal mood and affect   Assessment/Plan ***  Krista Blue 09/08/2022, 3:02 PM

## 2022-09-09 ENCOUNTER — Ambulatory Visit (INDEPENDENT_AMBULATORY_CARE_PROVIDER_SITE_OTHER): Payer: Medicare Other | Admitting: Physician Assistant

## 2022-09-09 ENCOUNTER — Encounter: Payer: Medicare Other | Admitting: Student

## 2022-09-09 DIAGNOSIS — Z9889 Other specified postprocedural states: Secondary | ICD-10-CM

## 2022-09-09 NOTE — Progress Notes (Signed)
This is a 76 year old female seen in our office for postop follow-up status post access right lower extremity wound debridement with placement of myriad on 09/02/2022 by Dr. Marla Roe.  Since surgery the patient has been doing well.  She notes she has been applying Xeroform gauze over her wound every other day.  She notes the redness and swelling in the right lower extremity has improved but continues to persist.  She has seen her cardiologist who is ordering further testing given the persistent swelling in the right lower extremity despite negative vascular ultrasounds.  She denies any infectious symptoms including fever chills nausea or vomiting.  Right lower extremity wound measuring approximately 4 cm x 3 cm with myriad sheet over top, Sorbact sheet hanging loosely, no significant exudate or drainage, no surrounding redness   Overall the patient is doing well, she has no signs of infection.  I did remove the Sorbact as it was falling off.  She will continue wound care with dressing changes every other day with surgical lube over top and nonocclusive dressing.  She will follow-up in 1 week for repeat evaluation.  She was given strict return precautions patient verbalized understanding and agreement to today's plan had no further questions or concerns.

## 2022-09-14 ENCOUNTER — Ambulatory Visit (INDEPENDENT_AMBULATORY_CARE_PROVIDER_SITE_OTHER): Payer: Medicare Other | Admitting: Physician Assistant

## 2022-09-14 DIAGNOSIS — Z9889 Other specified postprocedural states: Secondary | ICD-10-CM

## 2022-09-14 NOTE — Progress Notes (Signed)
Patient is a 76 year old female with PMH of right lower extremity s/p debridement and placement of myriad performed 09/02/2022 by Dr. Marla Roe who presents to clinic for postoperative follow-up.  She was last seen here in clinic on 09/09/2022.  At that time, wound was noted to be approximately 4 x 3 cm with.  She overtop.  The Sorbact she was hanging loosely, no significant exudate or drainage noted.  Sorbact was removed.  Plan was for continued wound care with surgical lube over nonocclusive dressing.  Today, patient is doing well.  She states that her daughter has been assisting with dressing changes every other day.  She has been using Xeroform followed by gauze, Kerlix, and secured with Ace wrap.  She reports that she went to cardiology last week and does not need to follow-up with them for another year.  She is hoping however that her cardiologist can prescribe her a diuretic to better address her right lower extremity swelling and edema which she reports is chronic secondary to total knee replacement.  Physical exam is reassuring.  3 x 2.5 x 0.25 cm wound.  She does piece of Myriad sheet over distal half of wound that did not incorporate.  Mild underlying slough.  No cellulitic changes.  Right-sided pedal edema.  Removed the residual unincorporated.  She over distal aspect of wound.  Cleaned the area and then applied liberal amount of K-Y jelly followed by Adaptic, gauze, Kerlix, and secured with Ace wrap.  Recommended that it be changed daily, if possible.  Ensured that the foot and right ankle was also wrapped given her moderate pedal edema.  After removing the unincorporated piece of myriad, there was good underlying granular tissue noted.  Healthy appearing wound.  Picture(s) obtained of the patient and placed in the chart were with the patient's or guardian's permission.  Return in 10 to 14 days, sooner if needed.

## 2022-09-15 ENCOUNTER — Telehealth: Payer: Self-pay

## 2022-09-15 NOTE — Telephone Encounter (Signed)
Faxed [facility] supply request with confirmed receipt. [facility] has provided service for the patient.

## 2022-09-16 IMAGING — DX DG CHEST 1V PORT
1 series · 1 of 1 positions shown · non-contrast
Comparison: August 11, 2021

CLINICAL DATA: Abdominal pain x2 days.

EXAM:
PORTABLE CHEST 1 VIEW

[chest ap]
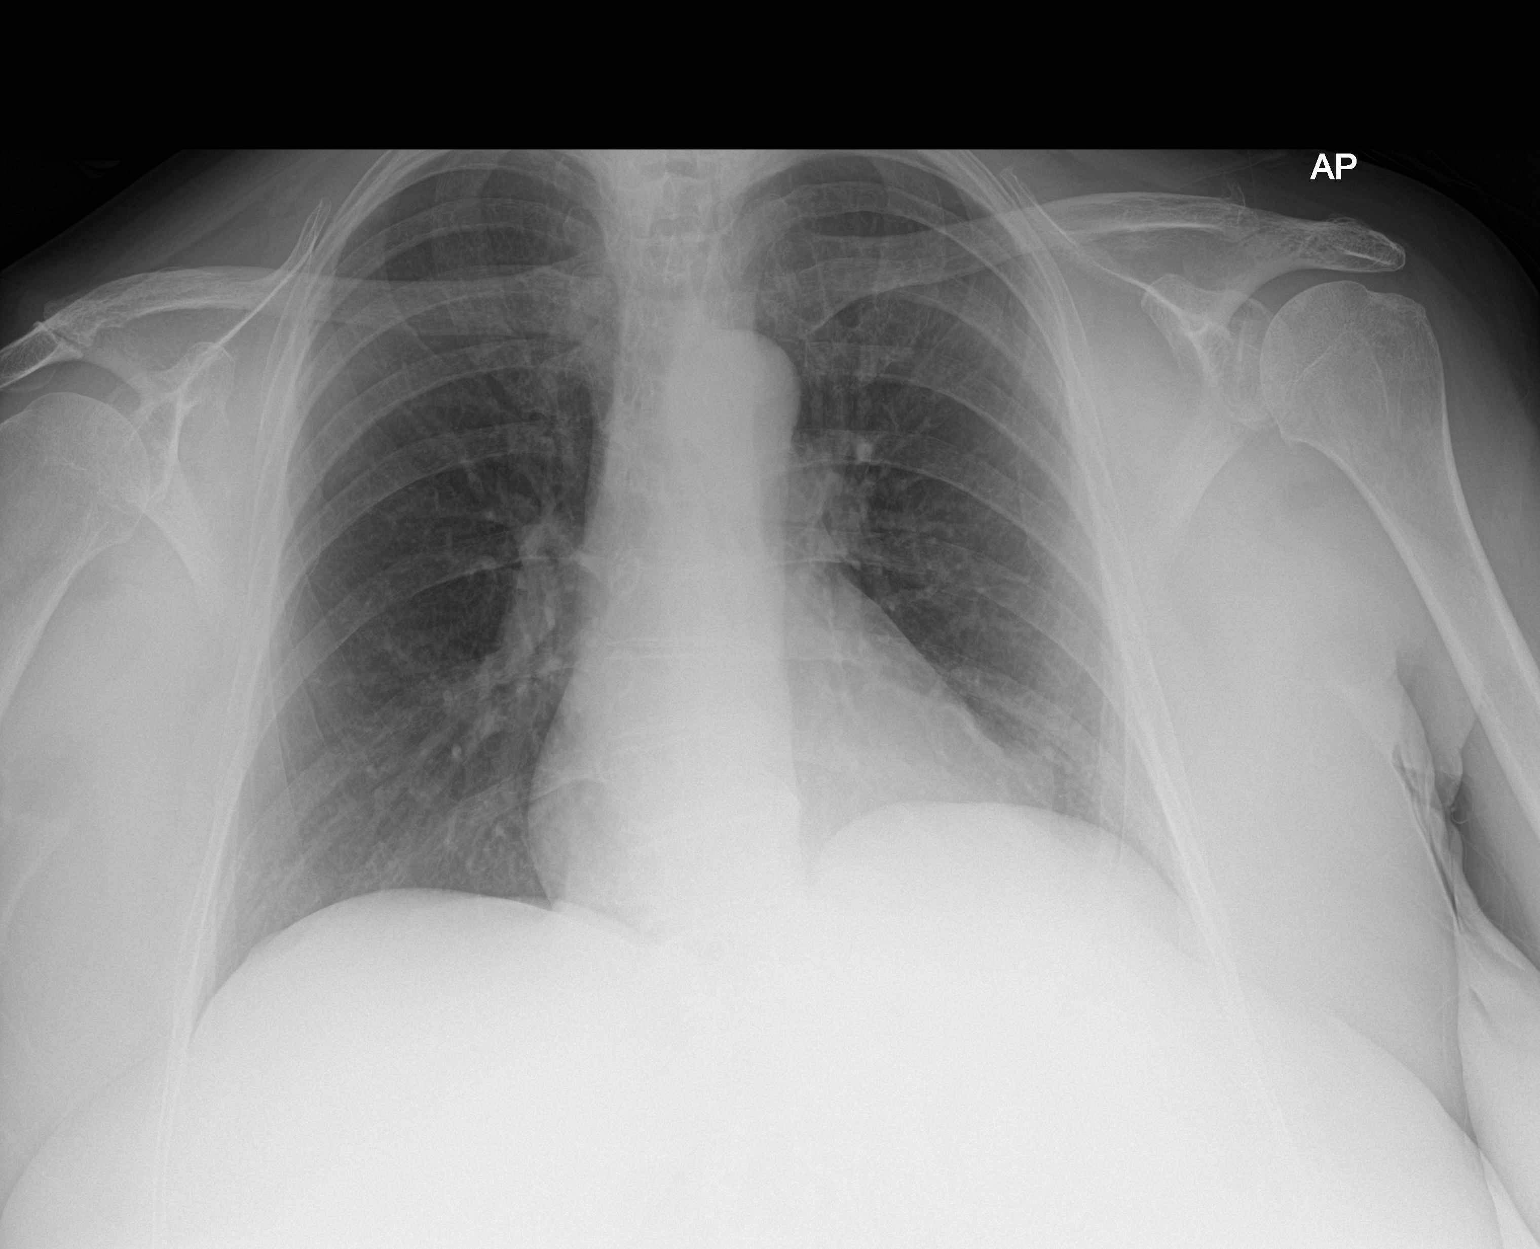

[1 of 1 positions shown; findings below may reference images not displayed]

FINDINGS: The heart size and mediastinal contours are within normal limits.
There is mild elevation of the left hemidiaphragm. Both lungs are
clear. The visualized skeletal structures are unremarkable.
IMPRESSION: No active cardiopulmonary disease.

## 2022-09-20 ENCOUNTER — Telehealth: Payer: Self-pay | Admitting: Plastic Surgery

## 2022-09-20 NOTE — Telephone Encounter (Signed)
Pt has concerns with her leg wound, would like someone to call her back

## 2022-09-21 NOTE — Telephone Encounter (Signed)
That's fine, she can come in tomorrow or Friday, whichever she prefers.  She can see either Nita Sells, or myself since they were managing her for the most part until I saw her most recently. Thanks

## 2022-09-21 NOTE — Telephone Encounter (Signed)
Pt had called and left voice message in reference to a knot at the bottom of her wound and was wanting to be seen or called to let her know what needs to be done.    Pt was called back no answer lmom for her to give the office a call back to speak with someone.

## 2022-09-27 NOTE — Progress Notes (Signed)
Patient is a 76 year old female with PMH of right lower extremity s/p debridement and placement of myriad performed 09/02/2022 by Dr. Marla Roe who presents to clinic for postoperative follow-up.  She was last seen here in clinic on 09/15/2019.  At that time, a piece of myriad sheet over distal half of the wound had not incorporated and was removed at bedside, well-tolerated by patient.  The 3 x 2.5 x 0.25 cm wound was otherwise reassuring and without any cellulitic changes.  It was cleaned followed by a liberal amount of K-Y jelly, Adaptic, gauze, Kerlix, and secured with Ace wrap.  Recommendation was for daily dressing changes.  Today,

## 2022-09-28 ENCOUNTER — Telehealth: Payer: Self-pay

## 2022-09-28 ENCOUNTER — Ambulatory Visit (INDEPENDENT_AMBULATORY_CARE_PROVIDER_SITE_OTHER): Payer: Medicare Other | Admitting: Physician Assistant

## 2022-09-28 DIAGNOSIS — S81801D Unspecified open wound, right lower leg, subsequent encounter: Secondary | ICD-10-CM

## 2022-09-28 DIAGNOSIS — Z9889 Other specified postprocedural states: Secondary | ICD-10-CM

## 2022-09-28 NOTE — Progress Notes (Signed)
Patient is a 76 year old female with PMH of right lower extremity s/p debridement and placement of myriad performed 09/02/2022 by Dr. Marla Roe who presents to clinic for postoperative follow-up.  She was last seen here in clinic on 09/15/2019.  At that time, a piece of myriad sheet over distal half of the wound had not incorporated and was removed at bedside, well-tolerated by patient.  The 3 x 2.5 x 0.25 cm wound was otherwise reassuring and without any cellulitic changes.  It was cleaned followed by a liberal amount of K-Y jelly, Adaptic, gauze, Kerlix, and secured with Ace wrap.  Recommendation was for daily dressing changes.  Today she notes she is doing well with no issues or concerns.  She has continued to do the Adaptic dressing changes, and notes that her wound appears to be improving.  She denies any infectious signs or symptoms.  On exam there is an approximate 2 cm x 2 cm x 0.25 cm wound along the right lower extremity with granulation tissue and clean edges, minimal surrounding redness, no warmth to touch, no exudate.  Overall the patient is doing well her wound continues to heal.  She denies any complaints or issues today.  She does have some surrounding redness, this appears to be chronic skin changes as opposed to any cellulitic component.  We will watch this closely.  I like to see her back in the office in 2 weeks for repeat evaluation.  She will continue to use K-Y jelly followed by Adaptic dressing, gauze, Ace bandage or wrap.  She will monitor for any signs of infection and return immediately if they present.  She verbalized understanding and agreement to today's plan

## 2022-09-28 NOTE — Telephone Encounter (Signed)
Pt stated that she doesn't have any KY Jelly or anything to wrap her leg in and needs it ordered. She stated that [facility] didn't use an Ace bandage but used another beige colored wrapping. I adv pt that the only thing we have in office to wrap wounds with is Ace bandages or white kerlix guaze. She stated that she does not have on an ace bandage. Per Jeff's ov note, he recommended pt wrap leg with an ace bandage. I adv pt that I would send Merry Proud a note and he could adv one of the CMAs here to send an order to [facility] as I would not be in the office tomorrow. Pt conveyed understanding.

## 2022-10-04 ENCOUNTER — Telehealth: Payer: Self-pay | Admitting: *Deleted

## 2022-10-04 NOTE — Telephone Encounter (Signed)
Faxed order on (10/03/22) to [facility] for supplies for the patient.  Supplies:[employer] 4inc[employer]  Confirmation received and copy scanned into the chart.//AB/CMA

## 2022-10-05 NOTE — Telephone Encounter (Signed)
Received on (10/03/22) via of fax Order Status Notification from [employer] has provided service for the patient; no further action is required..  Copy scanned into the chart.//AB/CMA

## 2022-10-10 ENCOUNTER — Telehealth: Payer: Self-pay | Admitting: Physician Assistant

## 2022-10-10 NOTE — Telephone Encounter (Signed)
Wants to know what meds she gets on legs when she comes in.

## 2022-10-12 ENCOUNTER — Ambulatory Visit (INDEPENDENT_AMBULATORY_CARE_PROVIDER_SITE_OTHER): Payer: Medicare Other | Admitting: Physician Assistant

## 2022-10-12 DIAGNOSIS — S81801A Unspecified open wound, right lower leg, initial encounter: Secondary | ICD-10-CM

## 2022-10-12 NOTE — Progress Notes (Signed)
Patient is a 76 year old female with PMH of right lower extremity s/p debridement and placement of myriad performed 09/02/2022 by Dr. Marla Roe who presents to clinic for postoperative follow-up.   She was last seen in the clinic on 09/28/2022.  She had been doing well at that time.  She was using K-Y jelly followed by Adaptic dressing.  She notes the wound has come continue to improve, she denies any infectious symptoms.  On exam the right lower extremity wound measures approximately 1 x 1.5 cm with good granulation tissue clean edges no surrounding redness warmth to touch or swelling.    Overall the patient is doing very well.  The wound has healed very well and is almost completely healed.  She has a small area remaining.  I recommend she continue Adaptic dressing, K-Y jelly followed by gauze and Coban.  She will follow back in our office for repeat evaluation.  I did place a [facility] order for her supplies.  Strict return precautions given.  She verbalized understanding and agreement to today's plan.

## 2022-10-13 ENCOUNTER — Telehealth: Payer: Self-pay

## 2022-10-13 NOTE — Telephone Encounter (Signed)
Faxed order to [facility] for surgilube and 3x3 adaptic apply daily

## 2022-10-20 ENCOUNTER — Telehealth: Payer: Self-pay

## 2022-10-20 NOTE — Telephone Encounter (Signed)
Per [facility]: Pt has reached the maximum allowable as outlined by their health plan. The patient will be eligible to receive additional supplies on 10/15/22. [facility] will be contacting the patient with pricing options and advise them of their eligible date for reordering.

## 2022-10-25 ENCOUNTER — Telehealth: Payer: Self-pay | Admitting: Plastic Surgery

## 2022-10-25 NOTE — Telephone Encounter (Signed)
Pt left message on phone right after 5 pm on 10/24/22.  Returned call to pt and lvm to return call regarding her voicemail.

## 2022-10-26 ENCOUNTER — Ambulatory Visit: Payer: Medicare Other | Admitting: Physician Assistant

## 2022-10-26 ENCOUNTER — Ambulatory Visit (INDEPENDENT_AMBULATORY_CARE_PROVIDER_SITE_OTHER): Payer: Medicare Other | Admitting: Physician Assistant

## 2022-10-26 DIAGNOSIS — S81801A Unspecified open wound, right lower leg, initial encounter: Secondary | ICD-10-CM | POA: Diagnosis not present

## 2022-10-26 NOTE — Progress Notes (Signed)
This is a 76 year old female with past medical history of right lower extremity wound status post debridement and placement of myriad performed on 09/02/2022 by Dr. Marla Roe who presents to the clinic today for postoperative follow-up and ongoing management of chronic wound.  Since she was last seen in the clinic on 10/12/2022 she denies any complaints or concerns including infection or issues with the wound.  She has continued K-Y jelly gauze and Coban.  On exam the right lower extremity has nearly completely healed there are a couple areas of very superficial dry skin but no open wounds, no surrounding redness discharge or warmth.    Overall the patient has healed very well.  At this point she does not require any further specific wound care.  I did let her know that if the wound continues to have any areas of dryness she may use Vaseline on this otherwise she may follow-up with [place] on a as needed basis.  The patient verbalized understanding and agreement to today's plan had no further questions or concerns at today's visit.

## 2022-10-28 ENCOUNTER — Encounter: Payer: Self-pay | Admitting: Podiatry

## 2022-10-28 ENCOUNTER — Ambulatory Visit (INDEPENDENT_AMBULATORY_CARE_PROVIDER_SITE_OTHER): Payer: Medicare Other | Admitting: Podiatry

## 2022-10-28 DIAGNOSIS — Z7901 Long term (current) use of anticoagulants: Secondary | ICD-10-CM

## 2022-10-28 DIAGNOSIS — M79674 Pain in right toe(s): Secondary | ICD-10-CM

## 2022-10-28 DIAGNOSIS — M79675 Pain in left toe(s): Secondary | ICD-10-CM | POA: Diagnosis not present

## 2022-10-28 DIAGNOSIS — B351 Tinea unguium: Secondary | ICD-10-CM

## 2022-10-28 NOTE — Progress Notes (Signed)
This patient presents to the office with chief complaint of long thick painful  big toenails.  Patient says the nails are painful walking and wearing shoes.  This patient is unable to self treat.  This patient is unable to trim her nails since she is unable to reach her nails.  She presents to the office for preventative foot care services.  General Appearance  Alert, conversant and in no acute stress.  Vascular  Dorsalis pedis and posterior tibial  pulses are palpable  bilaterally.  Capillary return is within normal limits  bilaterally. Temperature is within normal limits  bilaterally.  Neurologic  Senn-Weinstein monofilament wire test within normal limits  bilaterally. Muscle power within normal limits bilaterally.  Nails Thick disfigured discolored nails with subungual debris  hallux nails  bilaterally. No evidence of bacterial infection or drainage bilaterally.  Orthopedic  No limitations of motion  feet .  No crepitus or effusions noted.  No bony pathology or digital deformities noted.  Skin  normotropic skin with no porokeratosis noted bilaterally.  No signs of infections or ulcers noted.     Onychomycosis  Nails  B/L.  Pain in right toes  Pain in left toes  Debridement of nails both feet followed trimming the nails with dremel tool.    RTC 4  months.   Gardiner Barefoot DPM

## 2023-01-06 ENCOUNTER — Telehealth: Payer: Self-pay

## 2023-01-06 NOTE — Telephone Encounter (Signed)
Received incoming fax from [facility]: "[facility] has provided service for the patient; no further action is required."

## 2023-01-16 ENCOUNTER — Telehealth: Payer: Self-pay | Admitting: Physician Assistant

## 2023-01-16 NOTE — Telephone Encounter (Signed)
Pictures have been deleted from phone, when she had sx before and afterward and wants to know how to get them.

## 2023-02-10 ENCOUNTER — Ambulatory Visit: Payer: 59 | Admitting: Podiatry

## 2023-05-29 ENCOUNTER — Other Ambulatory Visit: Payer: Self-pay

## 2023-05-29 ENCOUNTER — Ambulatory Visit (HOSPITAL_BASED_OUTPATIENT_CLINIC_OR_DEPARTMENT_OTHER)
Admission: RE | Admit: 2023-05-29 | Discharge: 2023-05-29 | Disposition: A | Discharge status: Home / Self Care | Payer: 59 | Source: Ambulatory Visit | Attending: Medical | Admitting: Medical

## 2023-05-29 DIAGNOSIS — M79604 Pain in right leg: Secondary | ICD-10-CM | POA: Insufficient documentation

## 2023-05-31 ENCOUNTER — Telehealth: Payer: Self-pay | Admitting: Cardiovascular Disease

## 2023-05-31 NOTE — Telephone Encounter (Signed)
Spoke with the patient and told that No DVT was seen on her recent scan. She verbalized understanding

## 2023-05-31 NOTE — Telephone Encounter (Signed)
Returned call, left message. 

## 2023-05-31 NOTE — Telephone Encounter (Signed)
Patient is returning call. Requesting return call.  

## 2023-06-09 ENCOUNTER — Other Ambulatory Visit: Payer: Self-pay | Admitting: Cardiovascular Disease

## 2023-07-14 ENCOUNTER — Other Ambulatory Visit: Payer: Self-pay | Admitting: Cardiovascular Disease

## 2024-02-13 ENCOUNTER — Other Ambulatory Visit (HOSPITAL_COMMUNITY): Payer: Self-pay | Admitting: Sports Medicine

## 2024-02-13 DIAGNOSIS — M25561 Pain in right knee: Secondary | ICD-10-CM

## 2024-02-22 ENCOUNTER — Ambulatory Visit (HOSPITAL_COMMUNITY)
Admission: RE | Admit: 2024-02-22 | Discharge: 2024-02-22 | Disposition: A | Discharge status: Home / Self Care | Payer: Medicaid Other | Source: Ambulatory Visit | Attending: Sports Medicine | Admitting: Sports Medicine

## 2024-02-22 ENCOUNTER — Encounter (HOSPITAL_COMMUNITY)
Admission: RE | Admit: 2024-02-22 | Discharge: 2024-02-22 | Disposition: A | Discharge status: Home / Self Care | Payer: 59 | Source: Ambulatory Visit | Attending: Sports Medicine | Admitting: Sports Medicine

## 2024-02-22 DIAGNOSIS — M25561 Pain in right knee: Secondary | ICD-10-CM | POA: Insufficient documentation

## 2024-02-22 MED ORDER — TECHNETIUM TC 99M MEDRONATE IV KIT
20.0000 | PACK | Freq: Once | INTRAVENOUS | Status: AC | PRN
  Administered 2024-02-22: 21 via INTRAVENOUS

## 2024-04-24 ENCOUNTER — Encounter (HOSPITAL_BASED_OUTPATIENT_CLINIC_OR_DEPARTMENT_OTHER): Payer: Self-pay | Admitting: Orthopedic Surgery

## 2024-04-24 ENCOUNTER — Other Ambulatory Visit: Payer: Self-pay

## 2024-05-01 ENCOUNTER — Encounter (HOSPITAL_BASED_OUTPATIENT_CLINIC_OR_DEPARTMENT_OTHER): Payer: Self-pay | Admitting: Orthopedic Surgery

## 2024-05-01 ENCOUNTER — Ambulatory Visit (HOSPITAL_BASED_OUTPATIENT_CLINIC_OR_DEPARTMENT_OTHER): Admitting: Anesthesiology

## 2024-05-01 ENCOUNTER — Encounter (HOSPITAL_BASED_OUTPATIENT_CLINIC_OR_DEPARTMENT_OTHER)
Admission: RE | Disposition: A | Discharge status: Home / Self Care | Payer: Self-pay | Source: Home / Self Care | Attending: Orthopedic Surgery

## 2024-05-01 ENCOUNTER — Ambulatory Visit (HOSPITAL_BASED_OUTPATIENT_CLINIC_OR_DEPARTMENT_OTHER)
Admission: RE | Admit: 2024-05-01 | Discharge: 2024-05-01 | Disposition: A | Discharge status: Home / Self Care | Attending: Orthopedic Surgery | Admitting: Orthopedic Surgery

## 2024-05-01 ENCOUNTER — Other Ambulatory Visit: Payer: Self-pay

## 2024-05-01 DIAGNOSIS — J449 Chronic obstructive pulmonary disease, unspecified: Secondary | ICD-10-CM | POA: Diagnosis not present

## 2024-05-01 DIAGNOSIS — M19042 Primary osteoarthritis, left hand: Secondary | ICD-10-CM | POA: Diagnosis present

## 2024-05-01 DIAGNOSIS — Z7722 Contact with and (suspected) exposure to environmental tobacco smoke (acute) (chronic): Secondary | ICD-10-CM | POA: Insufficient documentation

## 2024-05-01 DIAGNOSIS — K219 Gastro-esophageal reflux disease without esophagitis: Secondary | ICD-10-CM | POA: Insufficient documentation

## 2024-05-01 DIAGNOSIS — Z79899 Other long term (current) drug therapy: Secondary | ICD-10-CM | POA: Diagnosis not present

## 2024-05-01 DIAGNOSIS — Z87891 Personal history of nicotine dependence: Secondary | ICD-10-CM | POA: Insufficient documentation

## 2024-05-01 DIAGNOSIS — I10 Essential (primary) hypertension: Secondary | ICD-10-CM

## 2024-05-01 DIAGNOSIS — J4489 Other specified chronic obstructive pulmonary disease: Secondary | ICD-10-CM | POA: Diagnosis not present

## 2024-05-01 HISTORY — PX: CYST REMOVAL HAND: SHX6279

## 2024-05-01 HISTORY — DX: Prediabetes: R73.03

## 2024-05-01 SURGERY — REMOVAL, CYST, HAND
Anesthesia: Monitor Anesthesia Care | Site: Middle Finger | Laterality: Left

## 2024-05-01 MED ORDER — LIDOCAINE 2% (20 MG/ML) 5 ML SYRINGE
INTRAMUSCULAR | Status: AC
  Filled 2024-05-01: qty 5

## 2024-05-01 MED ORDER — LIDOCAINE HCL (CARDIAC) PF 100 MG/5ML IV SOSY
PREFILLED_SYRINGE | INTRAVENOUS | Status: DC | PRN
  Administered 2024-05-01: 40 mg via INTRAVENOUS

## 2024-05-01 MED ORDER — OXYCODONE HCL 5 MG/5ML PO SOLN: 5.0000 mg | Freq: Once | ORAL | Status: DC | PRN

## 2024-05-01 MED ORDER — FENTANYL CITRATE (PF) 100 MCG/2ML IJ SOLN
INTRAMUSCULAR | Status: DC | PRN
  Administered 2024-05-01: 25 ug via INTRAVENOUS

## 2024-05-01 MED ORDER — EPHEDRINE SULFATE (PRESSORS) 50 MG/ML IJ SOLN
INTRAMUSCULAR | Status: DC | PRN
  Administered 2024-05-01: 10 mg via INTRAVENOUS

## 2024-05-01 MED ORDER — 0.9 % SODIUM CHLORIDE (POUR BTL) OPTIME
TOPICAL | Status: DC | PRN
  Administered 2024-05-01: 100 mL

## 2024-05-01 MED ORDER — FENTANYL CITRATE (PF) 100 MCG/2ML IJ SOLN
INTRAMUSCULAR | Status: AC
Start: 2024-05-01 — End: ?
  Filled 2024-05-01: qty 2

## 2024-05-01 MED ORDER — CLINDAMYCIN PHOSPHATE 900 MG/50ML IV SOLN
INTRAVENOUS | Status: DC | PRN
Start: 2024-05-01 — End: 2024-05-01
  Administered 2024-05-01: 900 mg via INTRAVENOUS

## 2024-05-01 MED ORDER — BUPIVACAINE HCL (PF) 0.25 % IJ SOLN
INTRAMUSCULAR | Status: DC | PRN
  Administered 2024-05-01: 10 mL

## 2024-05-01 MED ORDER — PROPOFOL 500 MG/50ML IV EMUL
INTRAVENOUS | Status: DC | PRN
  Administered 2024-05-01: 50 ug/kg/min via INTRAVENOUS

## 2024-05-01 MED ORDER — ONDANSETRON HCL 4 MG/2ML IJ SOLN
INTRAMUSCULAR | Status: AC
  Filled 2024-05-01: qty 2

## 2024-05-01 MED ORDER — CEFAZOLIN SODIUM-DEXTROSE 2-4 GM/100ML-% IV SOLN
INTRAVENOUS | Status: AC
  Filled 2024-05-01: qty 100

## 2024-05-01 MED ORDER — ONDANSETRON HCL 4 MG/2ML IJ SOLN
INTRAMUSCULAR | Status: DC | PRN
  Administered 2024-05-01: 4 mg via INTRAVENOUS

## 2024-05-01 MED ORDER — TRAMADOL HCL 50 MG PO TABS: 50.0000 mg | ORAL_TABLET | Freq: Four times a day (QID) | ORAL | 0 refills | Status: AC | PRN

## 2024-05-01 MED ORDER — FENTANYL CITRATE (PF) 100 MCG/2ML IJ SOLN: 25.0000 ug | INTRAMUSCULAR | Status: DC | PRN

## 2024-05-01 MED ORDER — BACITRACIN ZINC 500 UNIT/GM EX OINT
TOPICAL_OINTMENT | CUTANEOUS | Status: DC | PRN
  Administered 2024-05-01: 1 via TOPICAL

## 2024-05-01 MED ORDER — BACITRACIN ZINC 500 UNIT/GM EX OINT
TOPICAL_OINTMENT | CUTANEOUS | Status: AC
  Filled 2024-05-01: qty 28.35

## 2024-05-01 MED ORDER — ONDANSETRON HCL 4 MG/2ML IJ SOLN: 4.0000 mg | Freq: Once | INTRAMUSCULAR | Status: DC | PRN

## 2024-05-01 MED ORDER — CLINDAMYCIN PHOSPHATE 900 MG/50ML IV SOLN
INTRAVENOUS | Status: AC
  Filled 2024-05-01: qty 50

## 2024-05-01 MED ORDER — CEFAZOLIN SODIUM-DEXTROSE 2-4 GM/100ML-% IV SOLN: 2.0000 g | INTRAVENOUS | Status: DC

## 2024-05-01 MED ORDER — OXYCODONE HCL 5 MG PO TABS: 5.0000 mg | ORAL_TABLET | Freq: Once | ORAL | Status: DC | PRN

## 2024-05-01 MED ORDER — LACTATED RINGERS IV SOLN: INTRAVENOUS | Status: DC

## 2024-05-01 MED ORDER — PROPOFOL 500 MG/50ML IV EMUL
INTRAVENOUS | Status: AC
  Filled 2024-05-01: qty 50

## 2024-05-01 SURGICAL SUPPLY — 34 items
BANDAGE GAUZE 1X75IN STRL (MISCELLANEOUS) IMPLANT
BLADE SURG 15 STRL LF DISP TIS (BLADE) ×1 IMPLANT
BNDG COHESIVE 1X5 TAN STRL LF (GAUZE/BANDAGES/DRESSINGS) IMPLANT
BNDG ELASTIC 3INX 5YD STR LF (GAUZE/BANDAGES/DRESSINGS) IMPLANT
BNDG ESMARK 4X9 LF (GAUZE/BANDAGES/DRESSINGS) IMPLANT
BNDG GAUZE DERMACEA FLUFF 4 (GAUZE/BANDAGES/DRESSINGS) IMPLANT
CHLORAPREP W/TINT 26 (MISCELLANEOUS) ×1 IMPLANT
CORD BIPOLAR FORCEPS 12FT (ELECTRODE) ×1 IMPLANT
COVER BACK TABLE 60X90IN (DRAPES) ×1 IMPLANT
CUFF TOURN SGL QUICK 18X4 (TOURNIQUET CUFF) ×1 IMPLANT
CUFF TRNQT CYL 24X4X16.5-23 (TOURNIQUET CUFF) IMPLANT
DRAIN PENROSE 12X.25 LTX STRL (MISCELLANEOUS) IMPLANT
DRAPE EXTREMITY T 121X128X90 (DISPOSABLE) ×1 IMPLANT
DRAPE SURG 17X23 STRL (DRAPES) ×1 IMPLANT
GAUZE SPONGE 4X4 12PLY STRL (GAUZE/BANDAGES/DRESSINGS) IMPLANT
GAUZE XEROFORM 1X8 LF (GAUZE/BANDAGES/DRESSINGS) ×1 IMPLANT
GLOVE BIO SURGEON STRL SZ7 (GLOVE) ×1 IMPLANT
GLOVE BIOGEL PI IND STRL 7.0 (GLOVE) ×1 IMPLANT
GOWN STRL REUS W/ TWL LRG LVL3 (GOWN DISPOSABLE) ×2 IMPLANT
NDL HYPO 25X1 1.5 SAFETY (NEEDLE) IMPLANT
NEEDLE HYPO 25X1 1.5 SAFETY (NEEDLE) ×1 IMPLANT
NS IRRIG 1000ML POUR BTL (IV SOLUTION) ×1 IMPLANT
PACK BASIN DAY SURGERY FS (CUSTOM PROCEDURE TRAY) ×1 IMPLANT
PAD CAST 3X4 CTTN HI CHSV (CAST SUPPLIES) IMPLANT
SHEET MEDIUM DRAPE 40X70 STRL (DRAPES) ×1 IMPLANT
SLEEVE SCD COMPRESS KNEE MED (STOCKING) IMPLANT
SUT ETHILON 4 0 PS 2 18 (SUTURE) ×1 IMPLANT
SUT MNCRL AB 3-0 PS2 18 (SUTURE) IMPLANT
SUT VIC AB 4-0 PS2 18 (SUTURE) IMPLANT
SUT VICRYL RAPIDE 4/0 PS 2 (SUTURE) IMPLANT
SYR BULB EAR ULCER 3OZ GRN STR (SYRINGE) ×1 IMPLANT
SYR CONTROL 10ML LL (SYRINGE) ×1 IMPLANT
TOWEL GREEN STERILE FF (TOWEL DISPOSABLE) ×2 IMPLANT
UNDERPAD 30X36 HEAVY ABSORB (UNDERPADS AND DIAPERS) ×1 IMPLANT

## 2024-05-01 NOTE — Anesthesia Procedure Notes (Signed)
 Procedure Name: MAC Date/Time: 05/01/2024 9:39 AM  Performed by: Lucky Sable, CRNAPre-anesthesia Checklist: Timeout performed, Patient being monitored, Suction available, Emergency Drugs available and Patient identified Patient Re-evaluated:Patient Re-evaluated prior to induction Oxygen  Delivery Method: Simple face mask Preoxygenation: Pre-oxygenation with 100% oxygen  Induction Type: IV induction Placement Confirmation: breath sounds checked- equal and bilateral, CO2 detector and positive ETCO2

## 2024-05-01 NOTE — Anesthesia Postprocedure Evaluation (Signed)
 Anesthesia Post Note  Patient: Summer Jackson  Procedure(s) Performed: REMOVAL, CYST, HAND, middle finger (Left: Middle Finger)     Patient location during evaluation: PACU Anesthesia Type: MAC Level of consciousness: awake and alert and oriented Pain management: pain level controlled Vital Signs Assessment: post-procedure vital signs reviewed and stable Respiratory status: spontaneous breathing, nonlabored ventilation and respiratory function stable Cardiovascular status: stable and blood pressure returned to baseline Postop Assessment: no apparent nausea or vomiting Anesthetic complications: no   No notable events documented.  Last Vitals:  Vitals:   05/01/24 1011 05/01/24 1015  BP: 104/63 117/66  Pulse: 82 75  Resp: 20 12  Temp: (!) 36.2 C   SpO2: 96% 96%    Last Pain:  Vitals:   05/01/24 1015  TempSrc:   PainSc: 0-No pain                 Kendan Cornforth A.

## 2024-05-01 NOTE — H&P (Signed)
 HAND SURGERY   HPI: Patient is a 78 y.o. female who presents with a symptomatic left middle finger digital mucous cyst.  Patient denies any changes to their medical history or new systemic symptoms today.    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   Annotation: 1955 Qualifier: History of  By: Katheleen Palmer MD, [place]     Constipation 05/29/2013   COPD (chronic obstructive pulmonary disease) (HCC)    DVT (deep venous thrombosis) (HCC) 2022   after TKR   Exertional shortness of breath    Foreign body sensation in throat 12/16/2016   Gastroesophageal reflux disease with esophagitis 12/16/2016   GERD (gastroesophageal reflux disease)    Headache 03/20/2018   migraine Overview:  Overview:  migraine migraine   Headache(784.0)    migraine    Hemorrhoids    History of stomach ulcers 1990's   HYPERLIPIDEMIA 08/25/2007   Qualifier: Diagnosis of  By: Katheleen Palmer MD, [place]     HYPERTENSION 08/09/2007   Qualifier: Diagnosis of  By: Katheleen Palmer MD, [place]     Idiopathic peripheral autonomic neuropathy 03/27/2019   Left lower quadrant pain 09/05/2018   Left shoulder pain 02/05/2013   Lipoma of abdominal wall 01/23/2012   Nicotine dependence 11/23/2018   Pharyngeal dysphagia 12/16/2016   Pre-diabetes    Prediabetes 01/10/2013   Rectal bleeding    Scapular dyskinesis 03/26/2018   SHINGLES, RECURRENT 09/30/2008   Qualifier: Diagnosis of  By: Katheleen Palmer MD, [place]     Spondylolisthesis, lumbar region 04/10/2019   Added automatically from request for surgery [id number]   Sternal pain 03/26/2018   Synovial cyst of lumbar facet joint 04/08/2019   Urinary frequency    Weight loss    Wheezing    Past Surgical  History:  Procedure Laterality Date   APPENDECTOMY  1970's   BACK SURGERY  04/2019   COLONOSCOPY     COLONOSCOPY  12/22/2011   Procedure: COLONOSCOPY;  Surgeon: Yvetta Herbert, MD;  Location: [facility];  Service: Endoscopy;  Laterality: N/A;   heart surgery at 59yr  1955   "blood was going thru my lungs but not thru my heart like it should" (05/09/2013)   INCISION AND DRAINAGE OF WOUND Right 09/02/2022   Procedure: right leg wound excision with myriad placement;  Surgeon: Thornell Flirt, DO;  Location: [facility];  Service: Plastics;  Laterality: Right;   KNEE ARTHROSCOPY Left ~ 2004; ~ 2006   LEFT OOPHORECTOMY Left ~ 1985   LIPOMA EXCISION  02/15/2012   Procedure: EXCISION LIPOMA;  Surgeon: Brandy Cal. Cornett, MD;  Location:  SURGERY CENTER;  Service: General;  Laterality: Left;  excision lipoma Left Lower abdomen   TOTAL KNEE ARTHROPLASTY Left 05/08/2013   TOTAL KNEE ARTHROPLASTY Left 05/08/2013   Procedure: TOTAL KNEE ARTHROPLASTY;  Surgeon: Ferd Householder, MD;  [facility];  Service: Orthopedics;  Laterality: Left;   TOTAL KNEE ARTHROPLASTY Right 09/13/2021   Procedure: TOTAL KNEE ARTHROPLASTY;  Surgeon: Liliane Rei, MD;  Location: [facility];  Service: Orthopedics;  Laterality: Right;   TUBAL LIGATION Left ~ 1985   TUMOR EXCISION Left ~ 1984   "under breast" (05/09/2013)  Social History   Socioeconomic History   Marital status: Divorced    Spouse name: Not on file   Number of children: 2   Years of education: Not on file   Highest education level: Not on file  Occupational History   Occupation: cleaner    Comment: cleans apts and homes  Tobacco Use   Smoking status: Former    Current packs/day: 0.00    Average packs/day: 2.0 packs/day for 35.0 years (70.0 ttl pk-yrs)    Types: Cigarettes    Start date: 08/20/1978    Quit date: 08/20/2013    Years since quitting: 10.7   Smokeless tobacco: Never   Tobacco comments:    Currently exposed to  2nd hand smoke in home   Vaping Use   Vaping status: Never Used  Substance and Sexual Activity   Alcohol use: No   Drug use: No   Sexual activity: Not Currently    Birth control/protection: Post-menopausal  Other Topics Concern   Not on file  Social History Narrative   Lives with her daughter's father.   Social Drivers of Corporate investment banker Strain: Not on file  Food Insecurity: No Food Insecurity (12/29/2020)   [facility] from [facility], [facility]   Hunger Vital Sign    Worried About Running Out of Food in the Last Year: Never true    Ran Out of Food in the Last Year: Never true  Transportation Needs: Not on file  Physical Activity: Not on file  Stress: No Stress Concern Present (07/17/2023)   Received from [facility] - Occupational Stress Questionnaire    Feeling of Stress : Not at all  Social Connections: Unknown (04/19/2022)   Received from [facility], [facility]   Social Network    Social Network: Not on file   Family History  Problem Relation Age of Onset   Colon cancer Father    Cancer Father        colon   Heart disease Father    Heart disease Paternal Uncle    Diabetes Sister    Malignant hyperthermia Neg Hx    - negative except otherwise stated in the family history section Allergies  Allergen Reactions   Sulfa  Antibiotics Swelling and Rash   Xarelto  [Rivaroxaban ] Hives and Itching   Amoxicillin Other (See Comments)    REACTION: headache   Azithromycin Other (See Comments)    REACTION: reddness around mouth   Clindamycin /Lincomycin Hives   Erythromycin Hives   Penicillins Other (See Comments)    REACTION: headache Has patient had a PCN reaction causing immediate rash, facial/tongue/throat swelling, SOB or lightheadedness with hypotension: No Has patient had a PCN reaction causing severe rash involving mucus membranes or skin necrosis: No Has patient had a PCN reaction that required  hospitalization No Has patient had a PCN reaction occurring within the last 10 years: No If all of the above answers are NO, then may proceed with Cephalosporin use.  Tolerated Cephalosporin Date: 09/14/21.   Propoxyphene N-Acetaminophen  Other (See Comments)    REACTION: mouth sores   Clarithromycin Rash    REACTION: skin rash with minimal vesicular formation on arms and hands   Prior to Admission medications   Medication Sig Start Date End Date Taking? Authorizing Provider  albuterol  (VENTOLIN  HFA) 108 (90 Base) MCG/ACT inhaler Can inhale two puffs every four to six hours as needed for cough or wheeze. Patient taking differently: Inhale 2 puffs into the  lungs every 4 (four) hours as needed for wheezing (cough). 12/03/19  Yes Brian Campanile, MD  aspirin  EC 81 MG tablet Take 81 mg by mouth every morning. Swallow whole.   Yes [provider]  atorvastatin (LIPITOR) 40 MG tablet Take 40 mg by mouth daily.   Yes [provider]  BREO ELLIPTA  200-25 MCG/ACT AEPB Inhale 1 puff into the lungs daily as needed (sob & wheexzing). 02/10/22  Yes [provider]  buprenorphine (BUTRANS) 7.5 MCG/HR Place onto the skin once a week.   Yes [provider]  cetirizine  (ZYRTEC ) 10 MG tablet Take 10 mg by mouth daily.   Yes [provider]  cholecalciferol (VITAMIN D3) 25 MCG (1000 UNIT) tablet Take 1,000 Units by mouth daily.   Yes [provider]  cyclobenzaprine (FLEXERIL) 5 MG tablet Take 5 mg by mouth 3 (three) times daily as needed for muscle spasms.   Yes [provider]  fluticasone  (FLONASE ) 50 MCG/ACT nasal spray Place 1-2 sprays into both nostrils daily as needed for allergies. 12/03/19  Yes [provider]  Fluticasone -Umeclidin-Vilant (TRELEGY ELLIPTA) 100-62.5-25 MCG/ACT AEPB Inhale into the lungs.   Yes [provider]  lisinopril  (ZESTRIL ) 20 MG tablet Take 1 tablet by mouth once daily 07/14/23  Yes O'Neal,  Cathay Clonts, MD  pantoprazole  (PROTONIX ) 40 MG tablet Take 40 mg by mouth daily. 06/28/22  Yes [provider]  albuterol  (PROVENTIL ) (2.5 MG/3ML) 0.083% nebulizer solution Take 3 mLs (2.5 mg total) by nebulization every 6 (six) hours as needed for wheezing or shortness of breath. 10/04/14   Piepenbrink, Bridgette Campus, PA-C  furosemide  (LASIX ) 20 MG tablet Take 1 tablet (20 mg total) by mouth daily as needed. 01/31/22   O'NealCathay Clonts, MD   No results found. - Positive ROS: All other systems have been reviewed and were otherwise negative with the exception of those mentioned in the HPI and as above.  Physical Exam: General: No acute distress, resting comfortably Cardiovascular: BUE warm and well perfused, normal rate Respiratory: Normal WOB on RA Skin: Warm and dry Neurologic: Sensation intact distally Psychiatric: Patient is at baseline mood and affect  Left Upper Extremity  Digital mucous cyst at ulnar aspect of middle finger at dorsal aspect of DIP joint.  The cyst is mildly erythematous.  She has full AROM of all fingers.  SILT m/u/r distributions.  Fingers warm and well perfused.    Assessment: 78 yo F w/ left middle finger digital mucous cyst  Plan: OR today for left digital mucous cyst excision with osteophyte debridement. We again reviewed the risks of surgery which include bleeding, infection, damage to neurovascular structures, persistent symptoms, cyst recurence, damage to extensor apparatus or germinal matrix of nail, need for additional surgery.  Informed consent was signed.  All questions were answered.   Marilyn Shropshire, M.D. [facility] 8:29 AM

## 2024-05-01 NOTE — Anesthesia Preprocedure Evaluation (Signed)
 Anesthesia Evaluation  Patient identified by MRN, date of birth, ID band Patient awake    Reviewed: Allergy  & Precautions, NPO status , Patient's Chart, lab work & pertinent test results  History of Anesthesia Complications Negative for: history of anesthetic complications  Airway Mallampati: I  TM Distance: >3 FB Neck ROM: Full    Dental  (+) Edentulous Upper, Edentulous Lower   Pulmonary asthma , COPD,  COPD inhaler, former smoker   breath sounds clear to auscultation       Cardiovascular hypertension, Pt. on medications (-) angina Normal cardiovascular exam Rhythm:Regular Rate:Normal  '20 ECHO: 60-65%. The LV has normal function, no left ventricular hypertrophy. Elevated left atrial pressure.  Grade I DD, no significant valvular abnormalities   Neuro/Psych  Headaches Peripheral neuropathy  Neuromuscular disease  negative psych ROS   GI/Hepatic Neg liver ROS,GERD  Controlled and Medicated,,  Endo/Other  Obesity  Renal/GU Renal InsufficiencyRenal disease  negative genitourinary   Musculoskeletal  (+) Arthritis , Osteoarthritis,  Left middle finger mucoid cyst   Abdominal  (+) + obese  Peds  Hematology  (+) Blood dyscrasia, anemia   Anesthesia Other Findings   Reproductive/Obstetrics                              Anesthesia Physical Anesthesia Plan  ASA: 3  Anesthesia Plan: MAC   Post-op Pain Management: Tylenol  PO (pre-op)*   Induction: Intravenous  PONV Risk Score and Plan: 3 and Ondansetron , Treatment may vary due to age or medical condition and Propofol  infusion  Airway Management Planned: Natural Airway and Nasal Cannula  Additional Equipment: None  Intra-op Plan:   Post-operative Plan:   Informed Consent: I have reviewed the patients History and Physical, chart, labs and discussed the procedure including the risks, benefits and alternatives for the proposed anesthesia  with the patient or authorized representative who has indicated his/her understanding and acceptance.       Plan Discussed with: CRNA, Anesthesiologist and Surgeon  Anesthesia Plan Comments:          Anesthesia Quick Evaluation

## 2024-05-01 NOTE — Discharge Instructions (Addendum)
Audria Nine, M.D. Hand Surgery  POST-OPERATIVE DISCHARGE INSTRUCTIONS   PRESCRIPTIONS: - You may have been given a prescription to be taken as directed for post-operative pain control.  You may also take over the counter ibuprofen/aleve and tylenol for pain. Take this as directed on the packaging. Do not exceed 3000 mg tylenol/acetaminophen in 24 hours.  Ibuprofen 600-800 mg (3-4) tablets by mouth every 6 hours as needed for pain.   OR  Aleve 2 tablets by mouth every 12 hours (twice daily) as needed for pain.   AND/OR  Tylenol 1000 mg (2 tablets) every 8 hours as needed for pain.  - Please use your pain medication carefully, as refills are limited and you may not be provided with one.  As stated above, please use over the counter pain medicine - it will also be helpful with decreasing your swelling.    ANESTHESIA: -After your surgery, post-surgical discomfort or pain is likely. This discomfort can last several days to a few weeks. At certain times of the day your discomfort may be more intense.   Did you receive a nerve block?   - A nerve block can provide pain relief for one hour to two days after your surgery. As long as the nerve block is working, you will experience little or no sensation in the area the surgeon operated on.  - As the nerve block wears off, you will begin to experience pain or discomfort. It is very important that you begin taking your prescribed pain medication before the nerve block fully wears off. Treating your pain at the first sign of the block wearing off will ensure your pain is better controlled and more tolerable when full-sensation returns. Do not wait until the pain is intolerable, as the medicine will be less effective. It is better to treat pain in advance than to try and catch up.   General Anesthesia:  If you did not receive a nerve block during your surgery, you will need to start taking your pain medication shortly after your surgery and  should continue to do so as prescribed by your surgeon.     ICE AND ELEVATION: - You may use ice for the first 48-72 hours, but it is not critical.   - Motion of your fingers is very important to decrease the swelling.  - Elevation, as much as possible for the next 48 hours, is critical for decreasing swelling as well as for pain relief. Elevation means when you are seated or lying down, you hand should be at or above your heart. When walking, the hand needs to be at or above the level of your elbow.  - If the bandage gets too tight, it may need to be loosened. Please contact our office and we will instruct you in how to do this.    SURGICAL BANDAGES:  - Keep your dressing and/or splint clean and dry at all times.  You can remove your dressing 7 days from now and change with a dry dressing or Band-Aids as needed thereafter. - You may place a plastic bag over your bandage to shower, but be careful, do not get your bandages wet.  - After the bandages have been removed, it is OK to get the stitches wet in a shower or with hand washing. Do Not soak or submerge the wound yet. Please do not use lotions or creams on the stitches.      HAND THERAPY:  - You may not need any. If you  do, we will begin this at your follow up visit in the clinic.    ACTIVITY AND WORK: - You are encouraged to move any fingers which are not in the bandage.  - Light use of the fingers is allowed to assist the other hand with daily hygiene and eating, but strong gripping or lifting is often uncomfortable and should be avoided.  - You might miss a variable period of time from work and hopefully this issue has been discussed prior to surgery. You may not do any heavy work with your affected hand for about 2 weeks.    [facility] Second Floor, [address] [place], [place] [address] [phone]    Post Anesthesia Home Care Instructions  Activity: Get plenty of rest for the remainder of the day. A  responsible individual must stay with you for 24 hours following the procedure.  For the next 24 hours, DO NOT: -Drive a car -Paediatric nurse -Drink alcoholic beverages -Take any medication unless instructed by your physician -Make any legal decisions or sign important papers.  Meals: Start with liquid foods such as gelatin or soup. Progress to regular foods as tolerated. Avoid greasy, spicy, heavy foods. If nausea and/or vomiting occur, drink only clear liquids until the nausea and/or vomiting subsides. Call your physician if vomiting continues.  Special Instructions/Symptoms: Your throat may feel dry or sore from the anesthesia or the breathing tube placed in your throat during surgery. If this causes discomfort, gargle with warm salt water. The discomfort should disappear within 24 hours.  If you had a scopolamine patch placed behind your ear for the management of post- operative nausea and/or vomiting:  1. The medication in the patch is effective for 72 hours, after which it should be removed.  Wrap patch in a tissue and discard in the trash. Wash hands thoroughly with soap and water. 2. You may remove the patch earlier than 72 hours if you experience unpleasant side effects which may include dry mouth, dizziness or visual disturbances. 3. Avoid touching the patch. Wash your hands with soap and water after contact with the patch.

## 2024-05-01 NOTE — Interval H&P Note (Signed)
 History and Physical Interval Note:  05/01/2024 8:30 AM  Summer Jackson  has presented today for surgery, with the diagnosis of Left middle finger digitial mucous cyst.  The various methods of treatment have been discussed with the patient and family. After consideration of risks, benefits and other options for treatment, the patient has consented to  Procedure(s): REMOVAL, CYST, HAND (Left) as a surgical intervention.  The patient's history has been reviewed, patient examined, no change in status, stable for surgery.  I have reviewed the patient's chart and labs.  Questions were answered to the patient's satisfaction.     Dail Lerew

## 2024-05-01 NOTE — Transfer of Care (Signed)
 Immediate Anesthesia Transfer of Care Note  Patient: [facility]  Procedure(s) Performed: Procedure(s) (LRB): REMOVAL, CYST, HAND, middle finger (Left)  Patient Location: PACU  Anesthesia Type: MAC  Level of Consciousness: awake, sedated, patient cooperative and responds to stimulation, sleepy stable   Airway & Oxygen  Therapy: Patient Spontanous Breathing and Patient on RA  Post-op Assessment: Report given to PACU RN, Post -op Vital signs reviewed and stable and Patient sleepy   Post vital signs: Reviewed and stable  Complications: No apparent anesthesia complications

## 2024-05-01 NOTE — Op Note (Signed)
   Date of Surgery: 05/01/2024  INDICATIONS: Patient is a 78 y.o.-year-old female with left digital mucous cyst involving the middle finger.  This cyst has become increasingly painful.  Risks, benefits, and alternatives to surgery were again discussed with the patient in the preoperative area. The patient wishes to proceed with surgery.  Informed consent was signed after our discussion.   PREOPERATIVE DIAGNOSIS:  Left middle finger DIP osteoarthritis Left middle finger digital mucous cyst  POSTOPERATIVE DIAGNOSIS: Same.  PROCEDURE:  Left middle finger digital mucous cyst excision Debridement of left middle finger osteophyte   SURGEON: Auburn Blaze, M.D.  ASSIST: None  ANESTHESIA:  Local, MAC  IV FLUIDS AND URINE: See anesthesia.  ESTIMATED BLOOD LOSS: <5 mL.  IMPLANTS: * No implants in log *   DRAINS: None  COMPLICATIONS: None noted  DESCRIPTION OF PROCEDURE: The patient was met in the preoperative holding area where the surgical site was marked and the informed consent form was signed.  The patient was then brought back to the operating room and remained on the stretcher.  A hand table was placed adjacent to the operative extremity and locked into place. A formal timeout was performed to confirm that this was the correct patient, surgical side, surgical site, and surgical procedure.  All were present and in agreement. Following formal timeout, a local block was performed using 10 mL of 0.25% plain marcaine .  The left upper extremity was then prepped and draped in the usual and sterile fashion.   Following a second formal timeout, a Penrose drain was wrapped around the base of the middle finger, pulled taut, and clamped with a hemostat.  I then made a T shaped incision with a longitudinal limb running along the margin of the terminal tendon at the ulnar aspect of the DIP joint.  The transverse limb was along the DIP joint.  Full-thickness skin flaps were elevated.  The cyst was  immediately encountered.  The cyst, along with an interval of tissue between the lateral margin of the terminal tendon and the dorsal margin of the collateral ligament, was excised.  The underlying DIP joint was exposed.  There was some mucinous fluid coming from the joint.  The dorsal and ulnar aspect of the distal phalangeal base was excised with a rongeur.  A small house curette was used to debride the dorsal osteophyte from the base of the distal phalanx.  A bipolar cautery was used to gently cauterize the remainder of the capsule in this area.  The wound was then thoroughly irrigated with copious sterile saline.  The skin was then closed using a 4-0 Vicryl Rapide suture in simple fashion.  The wound was then dressed with Xeroform, bacitracin  ointment, 4 x 4 gauze, Kling wrap, and loose 1 inch Coban.  The Penrose drain was removed.  The fingertip was pink and perfused.   The patient was reversed from sedation.  All counts were correct x 2 at the end of the procedure.  The patient was then taken to the PACU in stable condition.   POSTOPERATIVE PLAN: She will be discharged to home with appropriate pain medication and discharge instructions.  I will see her back in the office in 10 to 14 days for her first postop visit.  Auburn Blaze, MD 10:13 AM

## 2024-05-02 ENCOUNTER — Encounter (HOSPITAL_BASED_OUTPATIENT_CLINIC_OR_DEPARTMENT_OTHER): Payer: Self-pay | Admitting: Orthopedic Surgery

## 2024-05-23 ENCOUNTER — Ambulatory Visit (INDEPENDENT_AMBULATORY_CARE_PROVIDER_SITE_OTHER): Admitting: Podiatry

## 2024-05-23 ENCOUNTER — Encounter: Payer: Self-pay | Admitting: Podiatry

## 2024-05-23 DIAGNOSIS — M79674 Pain in right toe(s): Secondary | ICD-10-CM | POA: Diagnosis not present

## 2024-05-23 DIAGNOSIS — M79675 Pain in left toe(s): Secondary | ICD-10-CM | POA: Diagnosis not present

## 2024-05-23 NOTE — Progress Notes (Signed)
This patient presents to the office with chief complaint of long thick painful  big toenails.  Patient says the nails are painful walking and wearing shoes.  This patient is unable to self treat.  This patient is unable to trim her nails since she is unable to reach her nails.  She presents to the office for preventative foot care services.  General Appearance  Alert, conversant and in no acute stress.  Vascular  Dorsalis pedis and posterior tibial  pulses are palpable  bilaterally.  Capillary return is within normal limits  bilaterally. Temperature is within normal limits  bilaterally.  Neurologic  Senn-Weinstein monofilament wire test within normal limits  bilaterally. Muscle power within normal limits bilaterally.  Nails Thick disfigured discolored nails with subungual debris  hallux nails  bilaterally. No evidence of bacterial infection or drainage bilaterally.  Orthopedic  No limitations of motion  feet .  No crepitus or effusions noted.  No bony pathology or digital deformities noted.  Skin  normotropic skin with no porokeratosis noted bilaterally.  No signs of infections or ulcers noted.     Onychomycosis  Nails  B/L.  Pain in right toes  Pain in left toes  Debridement of nails both feet followed trimming the nails with dremel tool.    RTC 4  months.   Gardiner Barefoot DPM

## 2024-07-16 ENCOUNTER — Other Ambulatory Visit (HOSPITAL_COMMUNITY): Payer: Self-pay | Admitting: Orthopaedic Surgery

## 2024-07-16 DIAGNOSIS — M25561 Pain in right knee: Secondary | ICD-10-CM

## 2024-07-22 ENCOUNTER — Encounter (HOSPITAL_COMMUNITY): Payer: Self-pay

## 2024-07-22 ENCOUNTER — Encounter (HOSPITAL_COMMUNITY): Admission: RE | Admit: 2024-07-22 | Source: Ambulatory Visit

## 2024-07-22 ENCOUNTER — Encounter (HOSPITAL_COMMUNITY)

## 2024-08-08 ENCOUNTER — Encounter: Payer: Self-pay | Admitting: Podiatry

## 2024-08-08 ENCOUNTER — Ambulatory Visit (INDEPENDENT_AMBULATORY_CARE_PROVIDER_SITE_OTHER): Admitting: Podiatry

## 2024-08-08 DIAGNOSIS — L84 Corns and callosities: Secondary | ICD-10-CM | POA: Diagnosis not present

## 2024-08-08 NOTE — Progress Notes (Signed)
 Patient presents for evaluation and treatment of tenderness around nails feet and hyperkeratotic lesions.   Physical exam:  General appearance: Alert, pleasant, and in no acute distress  Vascular: Pedal pulses: DP palpable bilateral, PT diminished bilaterally.  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 Skin thinned.  Decreased hair lower limbs BL.  Skin hyperpigmentation lower legs BL.  Musculoskeletal: Hammertoes 2 through 5 bilaterally.   Diagnosis: Painful hyperkeratotic lesions feet bilaterally.  (X 3 lesions)   Plan: Debrided hyperkeratotic lesions feet x 3.  Debrided hyperkeratotic lesions with 316 blade.   Return 3 months RFC

## 2024-08-22 ENCOUNTER — Ambulatory Visit: Admitting: Podiatry

## 2024-08-30 ENCOUNTER — Ambulatory Visit (INDEPENDENT_AMBULATORY_CARE_PROVIDER_SITE_OTHER): Admitting: Podiatry

## 2024-08-30 DIAGNOSIS — L97511 Non-pressure chronic ulcer of other part of right foot limited to breakdown of skin: Secondary | ICD-10-CM

## 2024-08-30 DIAGNOSIS — L03031 Cellulitis of right toe: Secondary | ICD-10-CM | POA: Diagnosis not present

## 2024-08-30 MED ORDER — DOXYCYCLINE HYCLATE 100 MG PO CAPS: 100.0000 mg | ORAL_CAPSULE | Freq: Two times a day (BID) | ORAL | 0 refills | Status: AC

## 2024-08-30 NOTE — Progress Notes (Signed)
 Chief Complaint  Patient presents with   Callouses    Right foot, interdigital callous/corn on medial side of 4. Looks irritated but not infected.  Not diabetic  ASA    HPI: 78 y.o. female presents today with concern of a sore corn on the right fourth toe.  She just saw Dr. Christine approximately 3 weeks ago for corns and calluses.  She states that he did not shave this corn.  She is also requesting her nails be trimmed today.  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   Annotation: 1955 Qualifier: History of  By: Loretha MD, [place]     Constipation 05/29/2013   COPD (chronic obstructive pulmonary disease) (HCC)    DVT (deep venous thrombosis) (HCC) 2022   after TKR   Exertional shortness of breath    Foreign body sensation in throat 12/16/2016   Gastroesophageal reflux disease with esophagitis 12/16/2016   GERD (gastroesophageal reflux disease)    Headache 03/20/2018   migraine Overview:  Overview:  migraine migraine   Headache(784.0)    migraine    Hemorrhoids    History of stomach ulcers 1990's   HYPERLIPIDEMIA 08/25/2007   Qualifier: Diagnosis of  By: Loretha MD, [place]     HYPERTENSION 08/09/2007   Qualifier: Diagnosis of  By: Loretha MD, [place]     Idiopathic peripheral autonomic neuropathy 03/27/2019   Left lower quadrant pain 09/05/2018   Left shoulder pain 02/05/2013   Lipoma of abdominal wall 01/23/2012   Nicotine dependence 11/23/2018   Pharyngeal dysphagia 12/16/2016   Pre-diabetes    Prediabetes 01/10/2013   Rectal bleeding    Scapular dyskinesis 03/26/2018   SHINGLES, RECURRENT 09/30/2008   Qualifier: Diagnosis of  By: Loretha MD, [place]     Spondylolisthesis,  lumbar region 04/10/2019   Added automatically from request for surgery [id number]   Sternal pain 03/26/2018   Synovial cyst of lumbar facet joint 04/08/2019   Urinary frequency    Weight loss    Wheezing    Past Surgical History:  Procedure Laterality Date   APPENDECTOMY  1970's   BACK SURGERY  04/2019   COLONOSCOPY     COLONOSCOPY  12/22/2011   Procedure: COLONOSCOPY;  Surgeon: Jerrell KYM Sol, MD;  Location: [facility];  Service: Endoscopy;  Laterality: N/A;   CYST REMOVAL HAND Left 05/01/2024   Procedure: REMOVAL, CYST, HAND, middle finger;  Surgeon: Romona Harari, MD;  Location: [facility];  Service: Orthopedics;  Laterality: Left;   heart surgery at 89yr  1955   blood was going thru my lungs but not thru my heart like it should (05/09/2013)   INCISION AND DRAINAGE OF WOUND Right 09/02/2022   Procedure: right leg wound excision with myriad placement;  Surgeon: Lowery Estefana RAMAN, DO;  Location: [facility];  Service: Plastics;  Laterality: Right;   KNEE ARTHROSCOPY Left ~ 2004; ~ 2006   LEFT OOPHORECTOMY Left ~ 1985   LIPOMA EXCISION  02/15/2012   Procedure: EXCISION LIPOMA;  Surgeon: Debby LABOR. Cornett, MD;  Location: [facility];  Service: General;  Laterality: Left;  excision lipoma Left Lower abdomen  TOTAL KNEE ARTHROPLASTY Left 05/08/2013   TOTAL KNEE ARTHROPLASTY Left 05/08/2013   Procedure: TOTAL KNEE ARTHROPLASTY;  Surgeon: Toribio JULIANNA Chancy, MD;  Location: [facility];  Service: Orthopedics;  Laterality: Left;   TOTAL KNEE ARTHROPLASTY Right 09/13/2021   Procedure: TOTAL KNEE ARTHROPLASTY;  Surgeon: Melodi Lerner, MD;  Location: [facility];  Service: Orthopedics;  Laterality: Right;   TUBAL LIGATION Left ~ 1985   TUMOR EXCISION Left ~ 1984   under breast (05/09/2013)   Allergies  Allergen Reactions   Sulfa  Antibiotics Swelling and Rash   Xarelto  [Rivaroxaban ] Hives and Itching   Amoxicillin Other (See Comments)    REACTION:  headache   Azithromycin Other (See Comments)    REACTION: reddness around mouth   Clindamycin /Lincomycin Hives   Erythromycin Hives   Penicillins Other (See Comments)    REACTION: headache Has patient had a PCN reaction causing immediate rash, facial/tongue/throat swelling, SOB or lightheadedness with hypotension: No Has patient had a PCN reaction causing severe rash involving mucus membranes or skin necrosis: No Has patient had a PCN reaction that required hospitalization No Has patient had a PCN reaction occurring within the last 10 years: No If all of the above answers are NO, then may proceed with Cephalosporin use.  Tolerated Cephalosporin Date: 09/14/21.   Propoxyphene N-Acetaminophen  Other (See Comments)    REACTION: mouth sores   Clarithromycin Rash    REACTION: skin rash with minimal vesicular formation on arms and hands    Physical Exam: Palpable pedal pulses noted.  There is a soft corn on the medial aspect of the right fourth toe.  Upon shaving of the corn there is a superficial ulceration measuring 0.2 x 0.1 x 0.1 cm.  The base is granular and the edges are macerated.  No active drainage is noted.  There is localized erythema and calor to the toe.  There are multiple contractures of the lesser toes.  Assessment/Plan of Care: 1. Cellulitis of right toe   2. Skin ulcer of fourth toe, limited to breakdown of skin, right (HCC)      Meds ordered this encounter  Medications   doxycycline  (VIBRAMYCIN ) 100 MG capsule    Sig: Take 1 capsule (100 mg total) by mouth 2 (two) times daily for 7 days.    Dispense:  14 capsule    Refill:  0   The ulcerated corn was debrided with sterile #313 blade to remove any devitalized soft tissue.  Iodosorb ointment and a dry sterile dressing was applied.  She was instructed to purchase iodine  or Betadine  solution at the drugstore to apply to the area followed by gauze covering until this is resolved.  Will send in doxycycline  100 mg twice daily  x 7 days for the localized cellulitis to the toe.  Informed the patient that if she recently had her corns and calluses shaved by another provider in our office, the nails cannot be trimmed today and be covered by her insurance.  Typically the corn and callus shaving needs to be performed on the same day as the nail care.  She would have to wait 3 months from her appointment with Dr. Christine for any nail care to be covered.  The out-of-pocket cost for self-pay nail trimming at this office is too high for the patient.  Follow-up in 1 week if not resolved.   Awanda CHARM Imperial, DPM, FACFAS [facility]     [address]  [place], [place] [address]                Office [phone]  Fax [phone]

## 2024-09-02 NOTE — Progress Notes (Incomplete)
 Right medial fourth toe ulcerated corn.  Ulcer is very superficial to the area is macerated underneath the corn upon debridement.  Slight increased warmth to the toe so this could be early cellulitis.  Sent in prescription for doxycycline .  Will have her start doing iodine  and gauze dressing to the area once daily.  Should be resolved in 1 week.  She requested nail care today but she just saw Dr. Christine 2 to 3 weeks ago for corns and calluses, and patient was informed this will not be covered at a separate time in the corn and callus care.   Chief Complaint  Patient presents with  . Callouses    Right foot, interdigital callous/corn on medial side of 4. Looks irritated but not infected.  Not diabetic  ASA     HPI: 78 y.o. female presents today ***  Past Medical History:  Diagnosis Date  . Allergy  to pollen 05/29/2013  . Arthritis    knees (05/09/2013)  . Asthma   . Blood transfusion ~ 1955   before heart OR (05/09/2013)  . Chronic cough 01/24/2013  . COMMON MIGRAINE 08/09/2007   Qualifier: Diagnosis of  By: Loretha MD, [facility]    . CONGENITAL HEART DISEASE, 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   Annotation: 1955 Qualifier: History of  By: Loretha MD, [facility]    . Constipation 05/29/2013  . COPD (chronic obstructive pulmonary disease) (HCC)   . DVT (deep venous thrombosis) (HCC) 2022   after TKR  . Exertional shortness of breath   . Foreign body sensation in throat 12/16/2016  . Gastroesophageal reflux disease with esophagitis 12/16/2016  . GERD (gastroesophageal reflux disease)   . Headache 03/20/2018   migraine Overview:  Overview:  migraine migraine  . Headache(784.0)    migraine   . Hemorrhoids   . History of stomach ulcers 1990's  . HYPERLIPIDEMIA 08/25/2007   Qualifier: Diagnosis of  By: Loretha MD, [facility]    . HYPERTENSION 08/09/2007   Qualifier: Diagnosis of  By: Loretha MD, [facility]    . Idiopathic peripheral autonomic neuropathy 03/27/2019  . Left lower quadrant pain  09/05/2018  . Left shoulder pain 02/05/2013  . Lipoma of abdominal wall 01/23/2012  . Nicotine dependence 11/23/2018  . Pharyngeal dysphagia 12/16/2016  . Pre-diabetes   . Prediabetes 01/10/2013  . Rectal bleeding   . Scapular dyskinesis 03/26/2018  . SHINGLES, RECURRENT 09/30/2008   Qualifier: Diagnosis of  By: Loretha MD, [facility]    . [facility] 04/10/2019   Added automatically from request for surgery [id number]  . Sternal pain 03/26/2018  . Synovial cyst of lumbar facet joint 04/08/2019  . Urinary frequency   . Weight loss   . Wheezing    Past Surgical History:  Procedure Laterality Date  . APPENDECTOMY  1970's  . BACK SURGERY  04/2019  . COLONOSCOPY    . COLONOSCOPY  12/22/2011   Procedure: COLONOSCOPY;  Surgeon: [facility] KYM Sol, MD;  [facility];  Service: Endoscopy;  Laterality: N/A;  . CYST REMOVAL HAND Left 05/01/2024   Procedure: REMOVAL, CYST, HAND, middle finger;  Surgeon: [facility] Harari, MD;  Location: [facility];  Service: Orthopedics;  Laterality: Left;  . heart surgery at 41yr  1955   blood was going thru my lungs but not thru my heart like it should (05/09/2013)  . INCISION AND DRAINAGE OF WOUND Right 09/02/2022   Procedure: right leg wound excision with myriad placement;  Surgeon: [facility] RAMAN, DO;  Location: [facility];  Service: [facility];  Laterality: Right;  . KNEE ARTHROSCOPY Left ~ 2004; ~ 2006  . LEFT OOPHORECTOMY Left ~ 1985  . LIPOMA EXCISION  02/15/2012   Procedure: EXCISION LIPOMA;  Surgeon: [facility] LABOR. Cornett, MD;  Location: [facility];  Service: General;  Laterality: Left;  excision lipoma Left Lower abdomen  . TOTAL KNEE ARTHROPLASTY Left 05/08/2013  . TOTAL KNEE ARTHROPLASTY Left 05/08/2013   Procedure: TOTAL KNEE ARTHROPLASTY;  Surgeon: [facility] JULIANNA Chancy, MD;  [facility];  Service: Orthopedics;  Laterality: Left;  . TOTAL KNEE ARTHROPLASTY Right 09/13/2021    Procedure: TOTAL KNEE ARTHROPLASTY;  Surgeon: [facility] Lerner, MD;  Location: [facility];  Service: Orthopedics;  Laterality: Right;  . TUBAL LIGATION Left ~ 1985  . TUMOR EXCISION Left ~ 1984   under breast (05/09/2013)   Allergies  Allergen Reactions  . Sulfa  Antibiotics Swelling and Rash  . Xarelto  [Rivaroxaban ] Hives and Itching  . Amoxicillin Other (See Comments)    REACTION: headache  . Azithromycin Other (See Comments)    REACTION: reddness around mouth  . Clindamycin /Lincomycin Hives  . Erythromycin Hives  . Penicillins Other (See Comments)    REACTION: headache Has patient had a PCN reaction causing immediate rash, facial/tongue/throat swelling, SOB or lightheadedness with hypotension: No Has patient had a PCN reaction causing severe rash involving mucus membranes or skin necrosis: No Has patient had a PCN reaction that required hospitalization No Has patient had a PCN reaction occurring within the last 10 years: No If all of the above answers are NO, then may proceed with Cephalosporin use.  Tolerated Cephalosporin Date: 09/14/21.  [facility] Propoxyphene N-Acetaminophen  Other (See Comments)    REACTION: mouth sores  . Clarithromycin Rash    REACTION: skin rash with minimal vesicular formation on arms and hands   ROS    Physical Exam: ***  Radiographic Exam:  Normal osseous mineralization. No fractures noted.  Assessment/Plan of Care: No diagnosis found.   Meds ordered this encounter  Medications  . doxycycline  (VIBRAMYCIN ) 100 MG capsule    Sig: Take 1 capsule (100 mg total) by mouth 2 (two) times daily for 7 days.    Dispense:  14 capsule    Refill:  0   None  ***   [name] D. Natelie Ostrosky, DPM, FACFAS [facility]     [address] [place], [place] [address]                Office [phone]  Fax [phone]

## 2024-09-18 ENCOUNTER — Ambulatory Visit: Admitting: Podiatry

## 2024-09-26 ENCOUNTER — Ambulatory Visit: Admitting: Podiatry

## 2024-11-22 ENCOUNTER — Ambulatory Visit: Admitting: Podiatry

## 2024-11-29 ENCOUNTER — Ambulatory Visit: Admitting: Podiatry
